# Patient Record
Sex: Female | Born: 1955 | Race: White | Hispanic: No | Smoking: Never smoker
Health system: Southern US, Community
[De-identification: ages and names within clinical notes are randomized; demographics above are authoritative.]

## PROBLEM LIST (undated history)

## (undated) DIAGNOSIS — I469 Cardiac arrest, cause unspecified: Secondary | ICD-10-CM

## (undated) DIAGNOSIS — IMO0001 Reserved for inherently not codable concepts without codable children: Secondary | ICD-10-CM

## (undated) DIAGNOSIS — R350 Frequency of micturition: Secondary | ICD-10-CM

## (undated) DIAGNOSIS — I509 Heart failure, unspecified: Secondary | ICD-10-CM

## (undated) DIAGNOSIS — F329 Major depressive disorder, single episode, unspecified: Secondary | ICD-10-CM

## (undated) DIAGNOSIS — I499 Cardiac arrhythmia, unspecified: Secondary | ICD-10-CM

## (undated) DIAGNOSIS — K219 Gastro-esophageal reflux disease without esophagitis: Secondary | ICD-10-CM

## (undated) DIAGNOSIS — D649 Anemia, unspecified: Secondary | ICD-10-CM

## (undated) DIAGNOSIS — Z950 Presence of cardiac pacemaker: Secondary | ICD-10-CM

## (undated) DIAGNOSIS — I4891 Unspecified atrial fibrillation: Secondary | ICD-10-CM

## (undated) DIAGNOSIS — F32A Depression, unspecified: Secondary | ICD-10-CM

## (undated) DIAGNOSIS — I428 Other cardiomyopathies: Secondary | ICD-10-CM

## (undated) DIAGNOSIS — F419 Anxiety disorder, unspecified: Secondary | ICD-10-CM

## (undated) DIAGNOSIS — E079 Disorder of thyroid, unspecified: Secondary | ICD-10-CM

## (undated) DIAGNOSIS — J449 Chronic obstructive pulmonary disease, unspecified: Secondary | ICD-10-CM

## (undated) HISTORY — DX: Anemia, unspecified: D64.9

## (undated) HISTORY — DX: Other cardiomyopathies: I42.8

## (undated) HISTORY — DX: Unspecified atrial fibrillation: I48.91

## (undated) HISTORY — DX: Disorder of thyroid, unspecified: E07.9

## (undated) HISTORY — DX: Cardiac arrest, cause unspecified: I46.9

## (undated) HISTORY — PX: BREAST BIOPSY: SHX20

## (undated) HISTORY — DX: Anxiety disorder, unspecified: F41.9

## (undated) HISTORY — DX: Depression, unspecified: F32.A

## (undated) HISTORY — PX: INSERT / REPLACE / REMOVE PACEMAKER: SUR710

## (undated) HISTORY — DX: Major depressive disorder, single episode, unspecified: F32.9

## (undated) HISTORY — PX: BREAST EXCISIONAL BIOPSY: SUR124

---

## 2000-11-16 ENCOUNTER — Ambulatory Visit (HOSPITAL_COMMUNITY): Admission: RE | Admit: 2000-11-16 | Discharge: 2000-11-16 | Payer: Self-pay | Admitting: Cardiology

## 2001-03-24 ENCOUNTER — Encounter: Payer: Self-pay | Admitting: Internal Medicine

## 2001-03-24 ENCOUNTER — Ambulatory Visit (HOSPITAL_COMMUNITY): Admission: RE | Admit: 2001-03-24 | Discharge: 2001-03-24 | Payer: Self-pay | Admitting: Internal Medicine

## 2001-03-31 ENCOUNTER — Other Ambulatory Visit: Admission: RE | Admit: 2001-03-31 | Discharge: 2001-03-31 | Payer: Self-pay | Admitting: Internal Medicine

## 2001-04-18 ENCOUNTER — Encounter: Payer: Self-pay | Admitting: Internal Medicine

## 2001-04-18 ENCOUNTER — Ambulatory Visit (HOSPITAL_COMMUNITY): Admission: RE | Admit: 2001-04-18 | Discharge: 2001-04-18 | Payer: Self-pay | Admitting: Internal Medicine

## 2002-11-27 ENCOUNTER — Ambulatory Visit (HOSPITAL_COMMUNITY): Admission: RE | Admit: 2002-11-27 | Discharge: 2002-11-27 | Payer: Self-pay | Admitting: Cardiology

## 2002-12-04 ENCOUNTER — Ambulatory Visit (HOSPITAL_COMMUNITY): Admission: RE | Admit: 2002-12-04 | Discharge: 2002-12-04 | Payer: Self-pay | Admitting: Internal Medicine

## 2002-12-04 ENCOUNTER — Encounter: Payer: Self-pay | Admitting: Internal Medicine

## 2004-10-28 ENCOUNTER — Emergency Department (HOSPITAL_COMMUNITY): Admission: EM | Admit: 2004-10-28 | Discharge: 2004-10-28 | Payer: Self-pay | Admitting: Emergency Medicine

## 2004-11-10 ENCOUNTER — Other Ambulatory Visit: Admission: RE | Admit: 2004-11-10 | Discharge: 2004-11-10 | Payer: Self-pay | Admitting: Otolaryngology

## 2005-11-11 ENCOUNTER — Ambulatory Visit (HOSPITAL_COMMUNITY): Admission: RE | Admit: 2005-11-11 | Discharge: 2005-11-11 | Payer: Self-pay | Admitting: Family Medicine

## 2007-05-29 ENCOUNTER — Ambulatory Visit (HOSPITAL_COMMUNITY): Admission: RE | Admit: 2007-05-29 | Discharge: 2007-05-29 | Payer: Self-pay | Admitting: Family Medicine

## 2009-01-03 ENCOUNTER — Ambulatory Visit (HOSPITAL_COMMUNITY): Admission: RE | Admit: 2009-01-03 | Discharge: 2009-01-03 | Payer: Self-pay | Admitting: Family Medicine

## 2010-08-23 ENCOUNTER — Encounter: Payer: Self-pay | Admitting: Family Medicine

## 2010-11-05 ENCOUNTER — Emergency Department (HOSPITAL_COMMUNITY)
Admission: EM | Admit: 2010-11-05 | Discharge: 2010-11-05 | Disposition: A | Discharge status: Home / Self Care | Payer: BC Managed Care – PPO | Attending: Emergency Medicine | Admitting: Emergency Medicine

## 2010-11-05 DIAGNOSIS — I2510 Atherosclerotic heart disease of native coronary artery without angina pectoris: Secondary | ICD-10-CM | POA: Insufficient documentation

## 2010-11-05 DIAGNOSIS — L500 Allergic urticaria: Secondary | ICD-10-CM | POA: Insufficient documentation

## 2010-12-07 ENCOUNTER — Encounter: Payer: Self-pay | Admitting: Cardiology

## 2010-12-07 ENCOUNTER — Ambulatory Visit (INDEPENDENT_AMBULATORY_CARE_PROVIDER_SITE_OTHER): Payer: BC Managed Care – PPO | Admitting: Cardiology

## 2010-12-07 DIAGNOSIS — F419 Anxiety disorder, unspecified: Secondary | ICD-10-CM | POA: Insufficient documentation

## 2010-12-07 DIAGNOSIS — I471 Supraventricular tachycardia: Secondary | ICD-10-CM | POA: Insufficient documentation

## 2010-12-07 DIAGNOSIS — D649 Anemia, unspecified: Secondary | ICD-10-CM

## 2010-12-07 DIAGNOSIS — I4891 Unspecified atrial fibrillation: Secondary | ICD-10-CM | POA: Insufficient documentation

## 2010-12-07 DIAGNOSIS — I4510 Unspecified right bundle-branch block: Secondary | ICD-10-CM

## 2010-12-07 MED ORDER — DILTIAZEM HCL ER COATED BEADS 180 MG PO CP24: 180.0000 mg | ORAL_CAPSULE | Freq: Every day | ORAL | Status: DC

## 2010-12-07 NOTE — Assessment & Plan Note (Signed)
Arrhythmias causing minimal, if any, symptoms.   Rate control is fairly good, but not optimal; diltiazem dosage will be increased to 180 mg q.d.   Echocardiogram will be repeated, as previous study was obtained 8 years ago and showed mild abnormality of left ventricular systolic function.  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  [name] [name] has no risk factors for thromboembolism, and aspirin serves as adequate antithrombotic therapy.

## 2010-12-07 NOTE — Patient Instructions (Signed)
Your physician recommends that you schedule a follow-up appointment in:6 MONTHS Your physician recommends that you return for lab work in: TODAY Your physician has requested that you have an echocardiogram. Echocardiography is a painless test that uses sound waves to create images of your heart. It provides your doctor with information about the size and shape of your heart and how well your heart's chambers and valves are working. This procedure takes approximately one hour. There are no restrictions for this procedure. REFERRAL TO [facility] [employer]: BETTY RATLIFF Your physician has recommended you make the following change in your medication:INCREASE DILTIAZEM TO 180MG  DAILY

## 2010-12-07 NOTE — Progress Notes (Signed)
HPI:  [name] [name] is seen at the kind request of Renelda Loma, nurse practitioner at the [facility].  I last saw this nice woman in 2005 after having followed her for PSVT and paroxysmal atrial fibrillation.  She is now referred for assessment and treatment of what appears to be chronic atrial fibrillation.  Patient also describes recent dyspnea, but this is fairly vague.  Symptoms appear to have been related to exertion, but have now resolved.  She has had no chest discomfort.  She denies orthopnea, PND or pedal edema.  She notes occasional mild palpitations.  There has been no lightheadedness or syncope.  Current Outpatient Prescriptions on File Prior to Visit  Medication Sig Dispense Refill  . atenolol (TENORMIN) 50 MG tablet Take 50 mg by mouth daily.        Marland Kitchen DISCONTD: diltiazem (CARDIZEM CD) 120 MG 24 hr capsule Take 120 mg by mouth daily.        Marland Kitchen DISCONTD: levothyroxine (SYNTHROID, LEVOTHROID) 125 MCG tablet Take 125 mcg by mouth daily.        Marland Kitchen DISCONTD: doxycycline (DORYX) 100 MG DR capsule Take 100 mg by mouth as needed.          No Known Allergies    Past Medical History  Diagnosis Date  . Paroxysmal supraventricular tachycardia     Lost to followup-2006  . Atrial fibrillation     Echocardiogram in 2004-borderline LV function; no valvular abnormalities  . Right bundle branch block   . Anxiety   . Anemia     2011: Hemoglobin of 11.4; hematocrit of 33.5; normal MCV     Past Surgical History  Procedure Date  . Breast excisional biopsy     Left x2     History reviewed. No pertinent family history.   History   Social History  . Marital Status: Divorced    Spouse Name: N/A    Number of Children: N/A  . Years of Education: N/A   Occupational History  . Not on file.   Social History Main Topics  . Smoking status: Never Smoker   . Smokeless tobacco: Never Used  . Alcohol Use: No  . Drug Use: No  . Sexually Active: Not on file   Other Topics  Concern  . Not on file   Social History Narrative  . No narrative on file     ROS:  Requires corrective lenses for near vision; arthritic discomfort of the hands; all other systems reviewed and are negative.  PHYSICAL EXAM: BP 140/90  Pulse 63  Ht 5\' 5"  (1.651 m)  Wt 116 lb (52.617 kg)  BMI 19.30 kg/m2  SpO2 96%  General-Well-developed; no acute distress; anxious HEENT-[place]/AT; PERRL; EOM intact; conjunctiva and lids nl Neck-No JVD; no carotid bruits Endocrine-No thyromegaly Lungs-No tachypnea, clear without rales, rhonchi or wheezes; mild kyphosis Cardiovascular- normal PMI; normal S1 and S2; irregular rhythm; modest systolic murmur Abdomen-BS normal; soft and non-tender without masses or organomegaly Musculoskeletal-No deformities, cyanosis or clubbing Neurologic-Nl cranial nerves; symmetric strength and tone Skin- Warm, no sig. Lesions Extremities-Nl distal pulses; no edema  EKG:   Atrial fibrillation with a slightly rapid ventricular response; PVCs vs aberrant beats; left anterior fascicular block; right bundle branch block; delayed R-wave progression suggestive of previous anteroseptal MI.   Comparison with previous tracing of 08/01/03, atrial fibrillation has replaced sinus bradycardia; otherwise no significant change.      With exercise, heart rate increased only modestly to 110 bpm.  Lab:  09/2009: CBC normal except for hemoglobin of 11.4 and hematocrit of 33.5; CMet normal; lipid profile: Total of 156, TG-52; HDL-48; LDL-98.  Digoxin level-0.6  ASSESSMENT AND PLAN:

## 2010-12-09 ENCOUNTER — Ambulatory Visit (HOSPITAL_COMMUNITY)
Admission: RE | Admit: 2010-12-09 | Discharge: 2010-12-09 | Disposition: A | Discharge status: Home / Self Care | Payer: BC Managed Care – PPO | Source: Ambulatory Visit | Attending: Cardiology | Admitting: Cardiology

## 2010-12-09 DIAGNOSIS — I059 Rheumatic mitral valve disease, unspecified: Secondary | ICD-10-CM

## 2010-12-09 DIAGNOSIS — I4891 Unspecified atrial fibrillation: Secondary | ICD-10-CM | POA: Insufficient documentation

## 2010-12-10 ENCOUNTER — Encounter: Payer: Self-pay | Admitting: *Deleted

## 2010-12-10 NOTE — Progress Notes (Signed)
Result letter mailed to pt.

## 2010-12-18 NOTE — Procedures (Signed)
   NAME:  [name] [name], [name] [name]                          ACCOUNT NO.:  [id number]   MEDICAL RECORD NO.:  [id number]                   PATIENT TYPE:  OUT   LOCATION:  RAD                                  FACILITY:  [facility]   PHYSICIAN:  Vida Roller, M.D.                DATE OF BIRTH:  Dec 26, 1955   DATE OF PROCEDURE:  11/27/2002  DATE OF DISCHARGE:                                  ECHOCARDIOGRAM   TAPE NUMBER:  [id number]   TAPE COUNT:  [id number]   CLINICAL INFORMATION:  This is a 55 year old woman with palpitations.   TECHNICAL QUALITY:  The technical quality of this study is good.   MEASUREMENTS M-MODE:  1. The aorta is 33 mm.  2. The left atrium is 33 mm.  3. The septum is 10 mm.  4. The posterior wall is 9 mm.  5. The left ventricular diastolic dimension is 46 mm.  6. The left ventricular systolic dimension is 37 mm.   2-D AND DOPPLER IMAGING:  1. The left ventricle is normal size with normal wall thickness. There     appears to be a mild decrement in overall left ventricular function     estimated at 45-50%.  The previous study shows a normal left ventricular     function and overall the wall motion is globally hypokinetic.  2. The right ventricle is normal size with normal systolic function.  3. Both atria appear normal size. There is no evidence of an atrioseptal.  4. The aortic valve is trileaflet and tricommisural with mild thickness.     There is trace insufficiency.  No cyanosis is seen.  5. The mitral valve is morphologically unremarkable with normal leaflet     movement.  There is mild insufficiency.  No stenosis is seen.  6. The tricuspid valve is morphologically unremarkable with trace     insufficiency.  No stenosis is seen.  7. The pulmonic valve is morphologically unremarkable with trace     insufficiency, no stenosis is seen.  8. The pericardial structures appear normal.  9. The ascending aorta appears normal.  10.      There is a normal sized inferior vena  cava.                                               Vida Roller, M.D.    JH/MEDQ  D:  11/27/2002  T:  11/27/2002  Job:  [id number]

## 2011-03-18 ENCOUNTER — Encounter (HOSPITAL_COMMUNITY): Payer: BC Managed Care – PPO

## 2011-03-18 ENCOUNTER — Other Ambulatory Visit (HOSPITAL_COMMUNITY): Payer: Self-pay | Admitting: Family Medicine

## 2011-03-18 DIAGNOSIS — Z139 Encounter for screening, unspecified: Secondary | ICD-10-CM

## 2011-03-25 ENCOUNTER — Ambulatory Visit (HOSPITAL_COMMUNITY)
Admission: RE | Admit: 2011-03-25 | Discharge: 2011-03-25 | Disposition: A | Discharge status: Home / Self Care | Payer: PRIVATE HEALTH INSURANCE | Source: Ambulatory Visit | Attending: Family Medicine | Admitting: Family Medicine

## 2011-03-25 ENCOUNTER — Ambulatory Visit (HOSPITAL_COMMUNITY): Admission: RE | Admit: 2011-03-25 | Payer: BC Managed Care – PPO | Source: Ambulatory Visit | Admitting: Podiatry

## 2011-03-25 ENCOUNTER — Encounter (HOSPITAL_COMMUNITY): Admission: RE | Payer: Self-pay | Source: Ambulatory Visit

## 2011-03-25 DIAGNOSIS — Z1231 Encounter for screening mammogram for malignant neoplasm of breast: Secondary | ICD-10-CM | POA: Insufficient documentation

## 2011-03-25 DIAGNOSIS — Z139 Encounter for screening, unspecified: Secondary | ICD-10-CM

## 2011-03-25 SURGERY — BUNIONECTOMY
Anesthesia: Monitor Anesthesia Care | Site: Foot | Laterality: Left

## 2011-04-19 ENCOUNTER — Telehealth: Payer: Self-pay | Admitting: Cardiology

## 2011-04-19 MED ORDER — DIGOXIN 125 MCG PO TABS: 125.0000 ug | ORAL_TABLET | Freq: Every day | ORAL | Status: DC

## 2011-04-19 NOTE — Telephone Encounter (Signed)
Spoke with patient and let her know rx called in

## 2011-04-19 NOTE — Telephone Encounter (Signed)
NEEDS DR Dietrich Pates TO PUT NEW ORDER IN FOR DIGOXIN SHE HAS ENOUGH FOR THIS MONTH BUT NO REFILLS LEFT/TMJ

## 2011-07-16 ENCOUNTER — Telehealth: Payer: Self-pay | Admitting: Cardiology

## 2011-07-16 DIAGNOSIS — I471 Supraventricular tachycardia: Secondary | ICD-10-CM

## 2011-07-16 DIAGNOSIS — I4891 Unspecified atrial fibrillation: Secondary | ICD-10-CM

## 2011-07-16 MED ORDER — DILTIAZEM HCL ER COATED BEADS 180 MG PO CP24: 180.0000 mg | ORAL_CAPSULE | Freq: Every day | ORAL | Status: DC

## 2011-07-16 NOTE — Telephone Encounter (Signed)
PT NEEDS DILTAZEM 180 MG CALLED IN TO [facility] SHE WILL BE OUT OF MEDS ON 07/20/11. SHE DID MAKE APPT WITH KATHRYN FOR 07/22/11/TMJ

## 2011-07-22 ENCOUNTER — Ambulatory Visit: Payer: PRIVATE HEALTH INSURANCE | Admitting: Adult Health

## 2011-08-05 ENCOUNTER — Other Ambulatory Visit: Payer: Self-pay | Admitting: Obstetrics & Gynecology

## 2011-08-05 NOTE — Patient Instructions (Signed)
20 [name] [name]  08/05/2011   Your procedure is scheduled on:  08/11/11  Report to [facility] at 11:50 AM.  Call this number if you have problems the morning of surgery: [phone]   Remember:   Do not eat food:After Midnight.  May have clear liquids:until Midnight .  Clear liquids include soda, tea, black coffee, apple or grape juice, broth.  Take these medicines the morning of surgery with A SIP OF WATER:    Do not wear jewelry, make-up or nail polish.  Do not wear lotions, powders, or perfumes. You may wear deodorant.  Do not shave 48 hours prior to surgery.  Do not bring valuables to the hospital.  Contacts, dentures or bridgework may not be worn into surgery.  Leave suitcase in the car. After surgery it may be brought to your room.  For patients admitted to the hospital, checkout time is 11:00 AM the day of discharge.   Patients discharged the day of surgery will not be allowed to drive home.  Name and phone number of your driver:   Special Instructions: CHG Shower Use Special Wash: 1/2 bottle night before surgery and 1/2 bottle morning of surgery.   Please read over the following fact sheets that you were given: Pain Booklet, MRSA Information, Surgical Site Infection Prevention, Anesthesia Post-op Instructions and Care and Recovery After Surgery    Conization of the Cervix Conization is the cutting (excision) of a cone-shaped portion of the cervix. This procedure is usually done when there is abnormal bleeding from the cervix. It can also be done to evaluate an abnormal Pap smear or if an abnormality is seen on the cervix during an exam. Conization of the cervix is not done during a menstrual period or pregnancy. BEFORE THE PROCEDURE  Do not eat or drink anything for 6 to 8 hours before the procedure, especially if you are going to be given a drug to make you sleep (general anesthetic).   Do not take aspirin or blood thinners for at least a week before the procedure, or as  directed.   Arrive at least an hour before the procedure to read and sign the necessary forms.   Arrange for someone to take you home after the procedure.   If you smoke, do not smoke for 2 weeks before the procedure.  LET YOUR CAREGIVER KNOW THE FOLLOWING:  Allergies to food or medications.   All the medications you are taking including over-the-counter and prescription medications, herbs, eyedrops, and creams.   You develop a cold or an infection.   If you are using illegal drugs or drinking too much alcohol.   Your smoking habits.   Previous problems with anesthetics including novocaine.   The possibility of being pregnant.   History of blood clots or other bleeding problems.   Other medical problems.  RISKS AND COMPLICATIONS   Bleeding.   Infection.   Damage to the cervix.   Injury to surrounding organs.   Problems with the anesthesia.  PROCEDURE Conization of the cervix can be done by:  Cold knife. This type cuts out the cervical canal and the transformation zone (where the normal cells end and the abnormal cells begin) with a scalpel.   LEEP (electrocautery). This type is done with a thin wire that can cut and burn (cauterize) the cervical tissue with an electrical current.   Laser treatment. This type cuts and burns (cauterizes) the tissue of the cervix to prevent bleeding with a laser beam.  You will be given a drug to make you sleep (general anesthetic) for cold knife and laser treatments, and you will be given a numbing medication (local anesthetic) for the LEEP procedure. Conization is usually done using colposcopy. Colposcopy magnifies the cervix to see it more clearly. The tissue removed is examined under a microscope by a doctor Psychologist, educational). The pathologist will provide a report to your caregiver. This will help your caregiver decide if further treatment is necessary. This report will also help your caregiver decide on the best treatment if your results  are abnormal.  AFTER THE PROCEDURE  If you had a general anesthetic, you may be groggy for a couple of hours after the procedure.   If you had a local anesthetic, you will be advised to rest at the surgical center or caregiver's office until you are stable and feel ready to go home.   Have someone take you home.   You may have some cramping for a couple of days.   You may have a bloody discharge or light bleeding for 2 to 4 weeks.   You may have a black discharge coming from the vagina. This is from the paste used on the cervix to prevent bleeding. This is normal discharge.  HOME CARE INSTRUCTIONS   Do not drive for 24 hours.   Avoid strenuous activities and exercises for at least 7 - 10 days.   Only take over-the-counter or prescription medicines for pain, discomfort, or fever as directed by your caregiver.   Do not take aspirin. It can cause or aggravate bleeding.   You may resume your usual diet.   Drink 6 to 8 glasses of fluid a day.   Rest and sleep the first 24 to 48 hours.   Take showers instead of baths until your caregiver gives you the okay.   Do not use tampons, douche or have intercourse for 4 weeks, or as advised by your caregiver.   Do not lift anything over 10 pounds (4.5 kg) for at least 7 to 10 days, or as advised by your caregiver.   Take your temperature twice a day, for 4 to 5 days. Write them down.   Do not drink or drive when taking pain medication.   If you develop constipation:   Take a mild laxative with the advice of your caregiver.   Eat bran foods.   Drink a lot of fluids.   Try to have someone with you or available for you the first 24 to 48 hours, especially if you had a general anesthetic.   Make sure you and your family understand everything about your surgery and recovery.   Follow your caregiver's advice regarding follow-up appointments and Pap smears.  SEEK MEDICAL CARE IF:   You develop a rash.   You are dizzy or  light-headed.   You feel sick to your stomach (nauseous).   You develop a bad smelling vaginal discharge.   You have an abnormal reaction or allergy to your medication.   You need stronger pain medication.  SEEK IMMEDIATE MEDICAL CARE IF:   You have blood clots or bleeding that is heavier than a normal menstrual period, or you develop bright red bleeding.   An oral temperature above 102 F (38.9 C) develops and persists for several hours.   You have increasing cramps.   You pass out.   You have painful or bloody urine.   You start throwing up (vomiting).   The pain is not relieved with  your pain medication.  Not all test results are available during your visit. If your test results are not back during your the visit, make an appointment with your caregiver to find out the results. Do not assume everything is normal if you have not heard from your caregiver or the medical facility. It is important for you to follow up on all of your test results. Document Released: 04/28/2005 Document Revised: 03/31/2011 Document Reviewed: 02/17/2009 [facility] Patient Information 2012 [place], [place].   PATIENT INSTRUCTIONS POST-ANESTHESIA  IMMEDIATELY FOLLOWING SURGERY:  Do not drive or operate machinery for the first twenty four hours after surgery.  Do not make any important decisions for twenty four hours after surgery or while taking narcotic pain medications or sedatives.  If you develop intractable nausea and vomiting or a severe headache please notify your doctor immediately.  FOLLOW-UP:  Please make an appointment with your surgeon as instructed. You do not need to follow up with anesthesia unless specifically instructed to do so.  WOUND CARE INSTRUCTIONS (if applicable):  Keep a dry clean dressing on the anesthesia/puncture wound site if there is drainage.  Once the wound has quit draining you may leave it open to air.  Generally you should leave the bandage intact for twenty four hours  unless there is drainage.  If the epidural site drains for more than 36-48 hours please call the anesthesia department.  QUESTIONS?:  Please feel free to call your physician or the hospital operator if you have any questions, and they will be happy to assist you.     [facility] Anesthesia Department [address] [place] [phone]

## 2011-08-06 ENCOUNTER — Ambulatory Visit (INDEPENDENT_AMBULATORY_CARE_PROVIDER_SITE_OTHER): Payer: PRIVATE HEALTH INSURANCE | Admitting: Adult Health

## 2011-08-06 ENCOUNTER — Encounter (HOSPITAL_COMMUNITY): Admission: RE | Admit: 2011-08-06 | Discharge: 2011-08-06 | Payer: BC Managed Care – PPO | Source: Ambulatory Visit

## 2011-08-06 ENCOUNTER — Encounter: Payer: Self-pay | Admitting: Adult Health

## 2011-08-06 DIAGNOSIS — I4891 Unspecified atrial fibrillation: Secondary | ICD-10-CM

## 2011-08-06 DIAGNOSIS — D649 Anemia, unspecified: Secondary | ICD-10-CM

## 2011-08-06 NOTE — Assessment & Plan Note (Signed)
Heart rate is well controlled and she is asymptomatic.  I will check labs with BMET and Dig level to evaluate her status on current medications. She will follow-up with Dr. Dietrich Pates in 6 months,she will continue ASA with CHADs score of 0. Will also check TSH has she is no longer on thyroid medications for follow-up.

## 2011-08-06 NOTE — Progress Notes (Signed)
   HPI: Ms. Berens in a very pleasant 56 y/o patient of Dr. Dietrich Pates we are following for ongoing assessment and treatment of PAF. She has a history of thyroid disease and anemia. She comes today without complaint with the exception of chronic fatigue. She works nights at [employer] and does not always get her sleep as she should. She denies palpitations or discomfort in her chest. She stated that when her HR was really elevated she felt pressure in her throat and neck. She is complaint with her medications. She has not been seen by a PCP since being seen by Dr. Dietrich Pates on consultation at their Raymondo Band, NP at [facility]-,in May of 2012.  No Known Allergies  Current Outpatient Prescriptions  Medication Sig Dispense Refill  . aspirin 325 MG tablet Take 325 mg by mouth daily.        Marland Kitchen atenolol (TENORMIN) 50 MG tablet Take 50 mg by mouth daily.        . digoxin (LANOXIN) 0.125 MG tablet Take 1 tablet (125 mcg total) by mouth daily.  30 tablet  6  . diltiazem (CARDIZEM CD) 180 MG 24 hr capsule Take 1 capsule (180 mg total) by mouth daily.  90 capsule  3    Past Medical History  Diagnosis Date  . Paroxysmal supraventricular tachycardia     Lost to followup-2006  . [place]-induced atrial fibrillation     Echocardiogram in 2004-borderline LV function; no valvular abnormalities  . Right bundle branch block   . Anxiety   . Anemia     2011: Hemoglobin of 11.4; hematocrit of 33.5; normal MCV    Past Surgical History  Procedure Date  . Breast excisional biopsy     Left x2    ZOX:WRUEAV of systems complete and found to be negative unless listed above PHYSICAL EXAM BP 126/66  Pulse 51  Resp 16  Ht 5\' 5"  (1.651 m)  Wt 118 lb (53.524 kg)  BMI 19.64 kg/m2  General: Well developed, well nourished, in no acute distress Head: Eyes PERRLA, No xanthomas.   Normal cephalic and atramatic  Lungs: Clear bilaterally to auscultation and percussion. Heart: HRRR S1 S2,  without MRG.  Pulses are 2+ & equal.            No carotid bruit. No JVD.  No abdominal bruits. No femoral bruits. Abdomen: Bowel sounds are positive, abdomen soft and non-tender without masses or                  Hernia's noted. Msk:  Back normal, normal gait. Normal strength and tone for age. Extremities: No clubbing, cyanosis or edema.  DP +1 Neuro: Alert and oriented X 3. Psych:  Good affect, responds appropriately   WUJ:WJXBJY fibrillation with aberrancy. T-wave inversion in the anterior/lateral leads.Rate of 85 bpm  ASSESSMENT AND PLAN

## 2011-08-06 NOTE — Assessment & Plan Note (Signed)
She is taking OTC gummy vitamins with iron. I will check her CBC to follow-up as she is not being followed by PCP regularly. She have it done at health department.

## 2011-08-06 NOTE — Patient Instructions (Signed)
**Note De-Identified Jorge Retz Obfuscation** Your physician recommends that you continue on your current medications as directed. Please refer to the Current Medication list given to you today.  Your physician recommends that you return for lab work in: today  Your physician recommends that you schedule a follow-up appointment in: 6 months

## 2011-08-09 ENCOUNTER — Encounter (HOSPITAL_COMMUNITY)
Admission: RE | Admit: 2011-08-09 | Discharge: 2011-08-09 | Disposition: A | Discharge status: Home / Self Care | Payer: BC Managed Care – PPO | Source: Ambulatory Visit | Attending: Obstetrics & Gynecology | Admitting: Obstetrics & Gynecology

## 2011-08-09 ENCOUNTER — Encounter (HOSPITAL_COMMUNITY): Payer: Self-pay

## 2011-08-09 ENCOUNTER — Other Ambulatory Visit: Payer: Self-pay

## 2011-08-09 ENCOUNTER — Encounter (HOSPITAL_COMMUNITY): Payer: Self-pay | Admitting: Pharmacy Technician

## 2011-08-09 HISTORY — DX: Cardiac arrhythmia, unspecified: I49.9

## 2011-08-09 LAB — COMPREHENSIVE METABOLIC PANEL
BUN: 19 mg/dL (ref 6–23)
CO2: 27 mEq/L (ref 19–32)
Calcium: 9.9 mg/dL (ref 8.4–10.5)
Creatinine, Ser: 0.84 mg/dL (ref 0.50–1.10)
GFR calc Af Amer: 88 mL/min — ABNORMAL LOW (ref >90)
GFR calc non Af Amer: 76 mL/min — ABNORMAL LOW (ref >90)
Glucose, Bld: 110 mg/dL — ABNORMAL HIGH (ref 70–99)

## 2011-08-09 LAB — URINALYSIS, ROUTINE W REFLEX MICROSCOPIC
Bilirubin Urine: NEGATIVE
Nitrite: NEGATIVE
Specific Gravity, Urine: >1.03 — ABNORMAL HIGH (ref 1.005–1.030)
Urobilinogen, UA: 0.2 mg/dL (ref 0.0–1.0)

## 2011-08-09 LAB — CBC
Hemoglobin: 11 g/dL — ABNORMAL LOW (ref 12.0–15.0)
MCH: 32.7 pg (ref 26.0–34.0)
MCV: 94.6 fL (ref 78.0–100.0)
RBC: 3.36 MIL/uL — ABNORMAL LOW (ref 3.87–5.11)

## 2011-08-11 ENCOUNTER — Encounter (HOSPITAL_COMMUNITY): Payer: Self-pay | Admitting: Anesthesiology

## 2011-08-11 ENCOUNTER — Ambulatory Visit (HOSPITAL_COMMUNITY): Payer: BC Managed Care – PPO | Admitting: Anesthesiology

## 2011-08-11 ENCOUNTER — Other Ambulatory Visit: Payer: Self-pay | Admitting: Obstetrics & Gynecology

## 2011-08-11 ENCOUNTER — Ambulatory Visit (HOSPITAL_COMMUNITY)
Admission: RE | Admit: 2011-08-11 | Discharge: 2011-08-11 | Disposition: A | Discharge status: Home / Self Care | Payer: BC Managed Care – PPO | Source: Ambulatory Visit | Attending: Obstetrics & Gynecology | Admitting: Obstetrics & Gynecology

## 2011-08-11 ENCOUNTER — Encounter (HOSPITAL_COMMUNITY)
Admission: RE | Disposition: A | Discharge status: Home / Self Care | Payer: Self-pay | Source: Ambulatory Visit | Attending: Obstetrics & Gynecology

## 2011-08-11 ENCOUNTER — Encounter (HOSPITAL_COMMUNITY): Payer: Self-pay | Admitting: *Deleted

## 2011-08-11 DIAGNOSIS — R87619 Unspecified abnormal cytological findings in specimens from cervix uteri: Secondary | ICD-10-CM | POA: Insufficient documentation

## 2011-08-11 DIAGNOSIS — N86 Erosion and ectropion of cervix uteri: Secondary | ICD-10-CM | POA: Insufficient documentation

## 2011-08-11 DIAGNOSIS — Z9889 Other specified postprocedural states: Secondary | ICD-10-CM

## 2011-08-11 HISTORY — PX: CERVICAL CONIZATION W/BX: SHX1330

## 2011-08-11 SURGERY — CONE BIOPSY, CERVIX
Anesthesia: General | Site: Cervix | Wound class: Clean Contaminated

## 2011-08-11 MED ORDER — ONDANSETRON HCL 4 MG/2ML IJ SOLN: 4.0000 mg | Freq: Once | INTRAMUSCULAR | Status: DC | PRN

## 2011-08-11 MED ORDER — FENTANYL CITRATE 0.05 MG/ML IJ SOLN
INTRAMUSCULAR | Status: AC
  Filled 2011-08-11: qty 5

## 2011-08-11 MED ORDER — CEFAZOLIN SODIUM 1-5 GM-% IV SOLN
INTRAVENOUS | Status: AC
  Filled 2011-08-11: qty 50

## 2011-08-11 MED ORDER — MIDAZOLAM HCL 2 MG/2ML IJ SOLN
INTRAMUSCULAR | Status: AC
  Administered 2011-08-11: 2 mg via INTRAVENOUS
  Filled 2011-08-11: qty 2

## 2011-08-11 MED ORDER — MIDAZOLAM HCL 2 MG/2ML IJ SOLN
INTRAMUSCULAR | Status: AC
  Filled 2011-08-11: qty 2

## 2011-08-11 MED ORDER — LACTATED RINGERS IV SOLN
INTRAVENOUS | Status: DC | PRN
  Administered 2011-08-11 (×2): via INTRAVENOUS

## 2011-08-11 MED ORDER — KETOROLAC TROMETHAMINE 30 MG/ML IJ SOLN
30.0000 mg | Freq: Once | INTRAMUSCULAR | Status: AC
  Administered 2011-08-11: 30 mg via INTRAVENOUS

## 2011-08-11 MED ORDER — HYDROCODONE-ACETAMINOPHEN 5-500 MG PO TABS: 1.0000 | ORAL_TABLET | Freq: Four times a day (QID) | ORAL | Status: AC | PRN

## 2011-08-11 MED ORDER — FENTANYL CITRATE 0.05 MG/ML IJ SOLN: 25.0000 ug | INTRAMUSCULAR | Status: DC | PRN

## 2011-08-11 MED ORDER — KETOROLAC TROMETHAMINE 10 MG PO TABS: 10.0000 mg | ORAL_TABLET | Freq: Three times a day (TID) | ORAL | Status: AC | PRN

## 2011-08-11 MED ORDER — ONDANSETRON HCL 8 MG PO TABS: 8.0000 mg | ORAL_TABLET | Freq: Three times a day (TID) | ORAL | Status: AC | PRN

## 2011-08-11 MED ORDER — CEFAZOLIN SODIUM 1-5 GM-% IV SOLN
1.0000 g | INTRAVENOUS | Status: AC
  Administered 2011-08-11: 1 g via INTRAVENOUS

## 2011-08-11 MED ORDER — FERRIC SUBSULFATE 259 MG/GM EX SOLN
CUTANEOUS | Status: AC
  Filled 2011-08-11: qty 8

## 2011-08-11 MED ORDER — PROPOFOL 10 MG/ML IV EMUL
INTRAVENOUS | Status: AC
  Filled 2011-08-11: qty 20

## 2011-08-11 MED ORDER — PROPOFOL 10 MG/ML IV EMUL
INTRAVENOUS | Status: DC | PRN
  Administered 2011-08-11: 150 mg via INTRAVENOUS

## 2011-08-11 MED ORDER — MIDAZOLAM HCL 5 MG/5ML IJ SOLN
INTRAMUSCULAR | Status: DC | PRN
  Administered 2011-08-11: 1 mg via INTRAVENOUS

## 2011-08-11 MED ORDER — KETOROLAC TROMETHAMINE 30 MG/ML IJ SOLN
INTRAMUSCULAR | Status: AC
  Administered 2011-08-11: 30 mg via INTRAVENOUS
  Filled 2011-08-11: qty 1

## 2011-08-11 MED ORDER — LACTATED RINGERS IV SOLN
INTRAVENOUS | Status: DC
  Administered 2011-08-11: 1000 mL via INTRAVENOUS

## 2011-08-11 MED ORDER — FERRIC SUBSULFATE 259 MG/GM EX SOLN
CUTANEOUS | Status: DC | PRN
  Administered 2011-08-11: 1

## 2011-08-11 MED ORDER — FENTANYL CITRATE 0.05 MG/ML IJ SOLN
INTRAMUSCULAR | Status: DC | PRN
  Administered 2011-08-11: 25 ug via INTRAVENOUS
  Administered 2011-08-11: 50 ug via INTRAVENOUS
  Administered 2011-08-11: 25 ug via INTRAVENOUS

## 2011-08-11 MED ORDER — EPHEDRINE SULFATE 50 MG/ML IJ SOLN
INTRAMUSCULAR | Status: DC | PRN
  Administered 2011-08-11: 7.5 mg via INTRAVENOUS

## 2011-08-11 MED ORDER — ONDANSETRON HCL 4 MG/2ML IJ SOLN
INTRAMUSCULAR | Status: AC
  Filled 2011-08-11: qty 2

## 2011-08-11 MED ORDER — MIDAZOLAM HCL 2 MG/2ML IJ SOLN
1.0000 mg | INTRAMUSCULAR | Status: DC | PRN
  Administered 2011-08-11: 2 mg via INTRAVENOUS

## 2011-08-11 SURGICAL SUPPLY — 33 items
BLADE SURG SZ10 CARB STEEL (BLADE) IMPLANT
CATH ROBINSON RED A/P 16FR (CATHETERS) IMPLANT
CLOTH BEACON ORANGE TIMEOUT ST (SAFETY) ×2 IMPLANT
COAGULATOR SUCT SWTCH 10FR 6 (ELECTROSURGICAL) IMPLANT
COVER LIGHT HANDLE STERIS (MISCELLANEOUS) ×4 IMPLANT
DECANTER SPIKE VIAL GLASS SM (MISCELLANEOUS) ×2 IMPLANT
ELECT REM PT RETURN 9FT ADLT (ELECTROSURGICAL)
ELECTRODE REM PT RTRN 9FT ADLT (ELECTROSURGICAL) IMPLANT
FORMALIN 10 PREFIL 120ML (MISCELLANEOUS) ×2 IMPLANT
GAUZE SPONGE 4X4 16PLY XRAY LF (GAUZE/BANDAGES/DRESSINGS) ×2 IMPLANT
GLOVE BIOGEL PI IND STRL 7.0 (GLOVE) ×2 IMPLANT
GLOVE BIOGEL PI IND STRL 8 (GLOVE) ×1 IMPLANT
GLOVE BIOGEL PI INDICATOR 7.0 (GLOVE) ×2
GLOVE BIOGEL PI INDICATOR 8 (GLOVE) ×1
GLOVE ECLIPSE 6.5 STRL STRAW (GLOVE) ×2 IMPLANT
GLOVE ECLIPSE 8.0 STRL XLNG CF (GLOVE) ×2 IMPLANT
GOWN STRL REIN XL XLG (GOWN DISPOSABLE) ×4 IMPLANT
KIT ROOM TURNOVER APOR (KITS) ×2 IMPLANT
LASER FIBER DISP 1000U (UROLOGICAL SUPPLIES) ×2 IMPLANT
MANIFOLD NEPTUNE II (INSTRUMENTS) ×2 IMPLANT
NEEDLE HYPO 25X1 1.5 SAFETY (NEEDLE) IMPLANT
NS IRRIG 1000ML POUR BTL (IV SOLUTION) ×2 IMPLANT
PACK BASIC III (CUSTOM PROCEDURE TRAY) ×1
PACK PERI GYN (CUSTOM PROCEDURE TRAY) IMPLANT
PACK SRG BSC III STRL LF ECLPS (CUSTOM PROCEDURE TRAY) ×1 IMPLANT
PAD ARMBOARD 7.5X6 YLW CONV (MISCELLANEOUS) ×4 IMPLANT
SET BASIN LINEN APH (SET/KITS/TRAYS/PACK) ×2 IMPLANT
SUT VIC AB 0 CT1 27 (SUTURE)
SUT VIC AB 0 CT1 27XCR 8 STRN (SUTURE) IMPLANT
SYR CONTROL 10ML LL (SYRINGE) IMPLANT
TOWEL OR 17X26 4PK STRL BLUE (TOWEL DISPOSABLE) ×2 IMPLANT
TUBING SMOKE EVAC CO2 (TUBING) ×2 IMPLANT
WATER STERILE IRR 1000ML POUR (IV SOLUTION) ×2 IMPLANT

## 2011-08-11 NOTE — Transfer of Care (Signed)
Immediate Anesthesia Transfer of Care Note  Patient: [name] [name]  Procedure(s) Performed:  CONIZATION CERVIX WITH BIOPSY - Laser Conization of Cervix  Patient Location: PACU  Anesthesia Type: General  Level of Consciousness: awake and alert   Airway & Oxygen Therapy: Patient connected to face mask oxygen  Post-op Assessment: Report given to PACU RN  Post vital signs: Reviewed  Complications: No apparent anesthesia complications

## 2011-08-11 NOTE — Discharge Instructions (Signed)
Conization of the Cervix Care After Read the instructions below and refer to this sheet in the next few weeks. These instructions provide you with general information on caring for yourself after you leave the hospital. Your caregiver may also give you additional or specific instructions. While your treatment has been planned according to the most current medical practices available, unavoidable problems sometimes happen. If you have any problems or questions after you leave, call your caregiver. DISCOMFORT AND NORMAL FINDINGS AFTER THE PROCEDURE  You may have cramps (similar to menstrual cramps) for a few days.   You may have a bloody discharge or light bleeding for 2 to 4 weeks.   You may have a black vaginal discharge. This is from the paste that was applied to the cervix to control bleeding.  HOME CARE INSTRUCTIONS   Do not drive for 24 hours.   Avoid strenuous activities and exercises for at least 7 - 10 days.   Only take over-the-counter or prescription medicines for pain, discomfort, or fever as directed by your caregiver.   Do not take aspirin. It can cause or aggravate bleeding.   You may resume your usual diet.   Drink 6 to 8 glasses of fluid a day.   Rest and sleep the first 24 to 48 hours.   Take showers instead of baths until your caregiver gives you the okay.   Do not use tampons, douche or have intercourse for 4 weeks, or as advised by your caregiver.   Do not lift anything over 10 pounds (4.5 kg) for at least 7 to 10 days, or as advised by your caregiver.   Take your temperature twice a day, for 4 to 5 days. Write them down.   Do not drink or drive when taking pain medication.   If you develop constipation:   Take a mild laxative with the advice of your caregiver.   Eat bran foods.   Drink a lot of fluids.   Try to have someone with you or available for you the first 24 to 48 hours, especially if you had a general anesthetic.   Make sure you and your  family understand everything about your surgery and recovery.   Follow your caregiver's advice regarding follow-up appointments and Pap smears.  SEEK MEDICAL CARE IF:   You develop a rash.   You are dizzy or light-headed.   You feel sick to your stomach (nauseous).   You develop a bad smelling vaginal discharge.   You have an abnormal reaction or allergy to your medication.   You need stronger pain medication.  SEEK IMMEDIATE MEDICAL CARE IF:   You have blood clots or bleeding that is heavier than a normal menstrual period, or you develop bright red bleeding.   An oral temperature above 102 F (38.9 C) develops and persists for several hours.   You have increasing cramps.   You pass out.   You have pain when urinating or bloody urine.   You start throwing up (vomiting).   The pain is not relieved with your pain medication.  MAKE SURE YOU:   Understand these instructions.   Will watch your condition.   Will get help right away if you are not doing well or get worse.  Document Released: 07/19/2005 Document Revised: 03/31/2011 Document Reviewed: 02/17/2009 [facility] Patient Information 2012 [place], [place].

## 2011-08-11 NOTE — Op Note (Signed)
Preoperative Diagnosis:  1.   ASC-H                                          2.  Inadequate colposcopy                                          3.  Lesion in endocervical canal  Postoperative Diagnosis:  Same as above  Procedure:  Laser conization of the cervix  Surgeon:  Lazaro Arms MD  Anaesthesia:  Laryngeal Mask Airway  Findings:  Patient had an abnormal pap smear which was evaluated in the office with colposcopy and directed biopsies.  There is a lesion extending into the endocervical canal and could not be thoroughly biopsied or seen with the colposcope.   As a result, the patient is admitted for laser conization of the cervix for both diagnostic and therapeutic reasons.  Description of Note:  Patient was taken to the OR and placed in the supine position where she underwent laryngeal mask airway anaesthesia.  She was placed in the dorsal lithotomy position.  She was draped for laser.  Graves speculum was placed and 3% acetic acid used and the laser microscope employed to perform colposcopy which confirmed the office findings.  Laser was used on typical cervical settings and used to incise a good margin around the squamocolumnar junction.  A circumferential incision was made.  The laser was then used to remove a conical shaped portion of cervix 5-7 mm in depth.    Surgical margin of several mm was employed beyond the acetowhite epithelium.  The laser was then used to ablate the cone bed.  Hemostasis was achieved with the laser and Monsel's solution.  Patient was awakened from anaesthesia in good stable condition and all counts were correct.  She received Ancef 1 gram and Toradol 30 mg IV preoperatively prophylactically.  Dannilynn Gallina H 08/11/2011 1:07 PM

## 2011-08-11 NOTE — Preoperative (Signed)
Beta Blockers   Reason not to administer Beta Blockers:Not Applicable 

## 2011-08-11 NOTE — Anesthesia Preprocedure Evaluation (Addendum)
Anesthesia Evaluation  Patient identified by MRN, date of birth, ID band Patient awake    Reviewed: Allergy & Precautions, H&P , NPO status , Patient's Chart, lab work & pertinent test results, reviewed documented beta blocker date and time   History of Anesthesia Complications Negative for: history of anesthetic complications  Airway Mallampati: I      Dental  (+) Teeth Intact   Pulmonary neg pulmonary ROS,  clear to auscultation        Cardiovascular + dysrhythmias Atrial Fibrillation Irregular Normal    Neuro/Psych    GI/Hepatic   Endo/Other    Renal/GU      Musculoskeletal   Abdominal   Peds  Hematology   Anesthesia Other Findings   Reproductive/Obstetrics                          Anesthesia Physical Anesthesia Plan  ASA: III  Anesthesia Plan: General   Post-op Pain Management:    Induction: Intravenous  Airway Management Planned: LMA  Additional Equipment:   Intra-op Plan:   Post-operative Plan: Extubation in OR  Informed Consent: I have reviewed the patients History and Physical, chart, labs and discussed the procedure including the risks, benefits and alternatives for the proposed anesthesia with the patient or authorized representative who has indicated his/her understanding and acceptance.     Plan Discussed with:   Anesthesia Plan Comments:         Anesthesia Quick Evaluation

## 2011-08-11 NOTE — H&P (Signed)
[name] [name] is an 56 y.o. female. With ASC-H pap smear and referred to me as a result.  On colpo lesion seen at endocervical canal extending into canal beyond what i  Could see.  Also could not really get a biopsy of the lesion.  As a result patient needs a diagnostic and therapeutic procedure to evaluate the lesion.     Past Medical History  Diagnosis Date  . Dysrhythmia     Past Surgical History  Procedure Date  . Breast biopsy     left, [facility]    Family History  Problem Relation Age of Onset  . Anesthesia problems Neg Hx   . Hypotension Neg Hx   . Malignant hyperthermia Neg Hx   . Pseudochol deficiency Neg Hx     Social History:  does not have a smoking history on file. She does not have any smokeless tobacco history on file. She reports that she does not drink alcohol or use illicit drugs.  Allergies: No Known Allergies    ROS  Review of Systems  Constitutional: Negative for fever, chills, weight loss, malaise/fatigue and diaphoresis.  HENT: Negative for hearing loss, ear pain, nosebleeds, congestion, sore throat, neck pain, tinnitus and ear discharge.   Eyes: Negative for blurred vision, double vision, photophobia, pain, discharge and redness.  Respiratory: Negative for cough, hemoptysis, sputum production, shortness of breath, wheezing and stridor.   Cardiovascular: Negative for chest pain, palpitations, orthopnea, claudication, leg swelling and PND.  Gastrointestinal: Negative for abdominal pain. Negative for heartburn, nausea, vomiting, diarrhea, constipation, blood in stool and melena.  Genitourinary: Negative for dysuria, urgency, frequency, hematuria and flank pain.  Musculoskeletal: Negative for myalgias, back pain, joint pain and falls.  Skin: Negative for itching and rash.  Neurological: Negative for dizziness, tingling, tremors, sensory change, speech change, focal weakness, seizures, loss of consciousness, weakness and headaches.  Endo/Heme/Allergies:  Negative for environmental allergies and polydipsia. Does not bruise/bleed easily.  Psychiatric/Behavioral: Negative for depression, suicidal ideas, hallucinations, memory loss and substance abuse. The patient is not nervous/anxious and does not have insomnia.     Physical Exam Physical Exam  Vitals reviewed. Constitutional: She is oriented to person, place, and time. She appears well-developed and well-nourished.  HENT:  Head: Normocephalic and atraumatic.  Right Ear: External ear normal.  Left Ear: External ear normal.  Nose: Nose normal.  Mouth/Throat: Oropharynx is clear and moist.  Eyes: Conjunctivae and EOM are normal. Pupils are equal, round, and reactive to light. Right eye exhibits no discharge. Left eye exhibits no discharge. No scleral icterus.  Neck: Normal range of motion. Neck supple. No tracheal deviation present. No thyromegaly present.  Cardiovascular: Normal rate, regular rhythm, normal heart sounds and intact distal pulses.  Exam reveals no gallop and no friction rub.   No murmur heard. Respiratory: Effort normal and breath sounds normal. No respiratory distress. She has no wheezes. She has no rales. She exhibits no tenderness.  GI: Soft. Bowel sounds are normal. She exhibits no distension and no mass. There is tenderness. There is no rebound and no guarding.  Genitourinary:       Vulva is normal without lesions Vagina is pink moist without discharge Cervix normal in appearance, on colpo lesion at endocervical canal and cannot be fully seen, requiring therapeutic and diagnostic removal with laser Uterus is normal size shape and contour Adnexa is negative with normal sized ovaries by sonogram  Musculoskeletal: Normal range of motion. She exhibits no edema and no tenderness.  Neurological: She  is alert and oriented to person, place, and time. She has normal reflexes. She displays normal reflexes. No cranial nerve deficit. She exhibits normal muscle tone. Coordination  normal.  Skin: Skin is warm and dry. No rash noted. No erythema. No pallor.  Psychiatric: She has a normal mood and affect. Her behavior is normal. Judgment and thought content normal.    Recent Results (from the past 336 hour(s))  SURGICAL PCR SCREEN   Collection Time   08/09/11  1:58 PM      Component Value Range   MRSA, PCR NEGATIVE  NEGATIVE    Staphylococcus aureus NEGATIVE  NEGATIVE   URINALYSIS, ROUTINE W REFLEX MICROSCOPIC   Collection Time   08/09/11  1:59 PM      Component Value Range   Color, Urine YELLOW  YELLOW    APPearance CLEAR  CLEAR    Specific Gravity, Urine >1.030 (*) 1.005 - 1.030    pH 5.0  5.0 - 8.0    Glucose, UA NEGATIVE  NEGATIVE (mg/dL)   Hgb urine dipstick NEGATIVE  NEGATIVE    Bilirubin Urine NEGATIVE  NEGATIVE    Ketones, ur NEGATIVE  NEGATIVE (mg/dL)   Protein, ur NEGATIVE  NEGATIVE (mg/dL)   Urobilinogen, UA 0.2  0.0 - 1.0 (mg/dL)   Nitrite NEGATIVE  NEGATIVE    Leukocytes, UA NEGATIVE  NEGATIVE   CBC   Collection Time   08/09/11  2:30 PM      Component Value Range   WBC 4.8  4.0 - 10.5 (K/uL)   RBC 3.36 (*) 3.87 - 5.11 (MIL/uL)   Hemoglobin 11.0 (*) 12.0 - 15.0 (g/dL)   HCT 96.0 (*) 45.4 - 46.0 (%)   MCV 94.6  78.0 - 100.0 (fL)   MCH 32.7  26.0 - 34.0 (pg)   MCHC 34.6  30.0 - 36.0 (g/dL)   RDW 09.8  11.9 - 14.7 (%)   Platelets 188  150 - 400 (K/uL)  COMPREHENSIVE METABOLIC PANEL   Collection Time   08/09/11  2:30 PM      Component Value Range   Sodium 140  135 - 145 (mEq/L)   Potassium 3.9  3.5 - 5.1 (mEq/L)   Chloride 106  96 - 112 (mEq/L)   CO2 27  19 - 32 (mEq/L)   Glucose, Bld 110 (*) 70 - 99 (mg/dL)   BUN 19  6 - 23 (mg/dL)   Creatinine, Ser 8.29  0.50 - 1.10 (mg/dL)   Calcium 9.9  8.4 - 56.2 (mg/dL)   Total Protein 6.7  6.0 - 8.3 (g/dL)   Albumin 3.5  3.5 - 5.2 (g/dL)   AST 24  0 - 37 (U/L)   ALT 16  0 - 35 (U/L)   Alkaline Phosphatase 99  39 - 117 (U/L)   Total Bilirubin 0.5  0.3 - 1.2 (mg/dL)   GFR calc non Af Amer 76 (*) >90  (mL/min)   GFR calc Af Amer 88 (*) >90 (mL/min)        Assessment/Plan: 1.  Endocervical lesion and inadequate colposcopy, needs tissue diagnosis both therapeutically and diagnostically  [name] [name] H 08/11/2011, 11:13 AM

## 2011-08-11 NOTE — Anesthesia Postprocedure Evaluation (Signed)
  Anesthesia Post-op Note  Patient: [name] [name]  Procedure(s) Performed:  CONIZATION CERVIX WITH BIOPSY - Laser Conization of Cervix  Patient Location: PACU  Anesthesia Type: General  Level of Consciousness: awake, alert  and oriented  Airway and Oxygen Therapy: Patient Spontanous Breathing  Post-op Pain: none  Post-op Assessment: Post-op Vital signs reviewed  Post-op Vital Signs: Reviewed and stable  Complications: No apparent anesthesia complications

## 2011-08-11 NOTE — Progress Notes (Signed)
No vaginal bleeding. No abdominal cramping.

## 2011-08-16 ENCOUNTER — Encounter (HOSPITAL_COMMUNITY): Payer: Self-pay | Admitting: Obstetrics & Gynecology

## 2011-09-03 ENCOUNTER — Other Ambulatory Visit: Payer: Self-pay | Admitting: *Deleted

## 2011-09-03 ENCOUNTER — Other Ambulatory Visit: Payer: Self-pay | Admitting: Cardiology

## 2011-09-03 MED ORDER — DIGOXIN 125 MCG PO TABS: 125.0000 ug | ORAL_TABLET | Freq: Every day | ORAL | Status: DC

## 2011-09-03 NOTE — Telephone Encounter (Signed)
Pt can not afford digoxin wants to know if we could switch her to something cheeper

## 2011-11-05 ENCOUNTER — Telehealth: Payer: Self-pay | Admitting: *Deleted

## 2011-11-05 NOTE — Telephone Encounter (Signed)
Patient called stating she is "having a panic attack" and cant breathe.  Wants to know what to do.  Advised her that I cannot give advise on this situation over the phone and the best thing for her to do is have someone take her to the ER.  Verbalized understanding.

## 2011-12-07 ENCOUNTER — Other Ambulatory Visit: Payer: Self-pay | Admitting: Cardiology

## 2011-12-07 MED ORDER — ATENOLOL 50 MG PO TABS: 50.0000 mg | ORAL_TABLET | Freq: Every day | ORAL | Status: DC

## 2011-12-16 ENCOUNTER — Other Ambulatory Visit: Payer: Self-pay

## 2011-12-16 DIAGNOSIS — R0602 Shortness of breath: Secondary | ICD-10-CM

## 2011-12-22 ENCOUNTER — Ambulatory Visit (HOSPITAL_COMMUNITY)
Admission: RE | Admit: 2011-12-22 | Discharge: 2011-12-22 | Disposition: A | Discharge status: Home / Self Care | Payer: BC Managed Care – PPO | Source: Ambulatory Visit | Attending: Pulmonary Disease | Admitting: Pulmonary Disease

## 2011-12-22 DIAGNOSIS — R0602 Shortness of breath: Secondary | ICD-10-CM | POA: Insufficient documentation

## 2011-12-22 MED ORDER — ALBUTEROL SULFATE (5 MG/ML) 0.5% IN NEBU
2.5000 mg | INHALATION_SOLUTION | Freq: Once | RESPIRATORY_TRACT | Status: AC
  Administered 2011-12-22: 2.5 mg via RESPIRATORY_TRACT

## 2011-12-24 NOTE — Procedures (Signed)
NAMEKATRENA, STEHLIN                ACCOUNT NO.:  [id number]  MEDICAL RECORD NO.:  [id number]  LOCATION:                                 FACILITY:  PHYSICIAN:  Sherlene Rickel L. Juanetta Gosling, M.D.DATE OF BIRTH:  [birth year]  DATE OF PROCEDURE:  12/23/2011 DATE OF DISCHARGE:                           PULMONARY FUNCTION TEST   Reason for pulmonary function testing is shortness of breath.  1. Spirometry shows a moderate-to-severe ventilatory defect with     evidence of airflow obstruction. 2. Lung volumes show mild reduction of total lung capacity and     probable air trapping. 3. DLCO is moderately reduced, and does correct some when ventilation     is taken into account. 4. Airway resistance is elevated confirming the presence of airflow     obstruction. 5. There is improvement with inhaled bronchodilator, but not to the     level of significance.     Elvia Aydin L. Juanetta Gosling, M.D.     [facility]/MEDQ  D:  12/23/2011  T:  12/23/2011  Job:  [id number]

## 2012-02-10 ENCOUNTER — Ambulatory Visit: Payer: PRIVATE HEALTH INSURANCE | Admitting: Cardiology

## 2012-02-15 ENCOUNTER — Ambulatory Visit (INDEPENDENT_AMBULATORY_CARE_PROVIDER_SITE_OTHER): Payer: PRIVATE HEALTH INSURANCE | Admitting: Adult Health

## 2012-02-15 ENCOUNTER — Encounter: Payer: Self-pay | Admitting: Adult Health

## 2012-02-15 VITALS — BP 118/64 | HR 80 | Ht 65.0 in | Wt 114.0 lb

## 2012-02-15 DIAGNOSIS — I10 Essential (primary) hypertension: Secondary | ICD-10-CM

## 2012-02-15 DIAGNOSIS — D649 Anemia, unspecified: Secondary | ICD-10-CM

## 2012-02-15 DIAGNOSIS — I4891 Unspecified atrial fibrillation: Secondary | ICD-10-CM

## 2012-02-15 NOTE — Assessment & Plan Note (Signed)
Currently well controlled on mediations. No changes at this time.

## 2012-02-15 NOTE — Assessment & Plan Note (Signed)
Most recent labs drawn on 02/02/2012 Hgb 10.8 and Hct 32.2. PLTs 300. BMET WNL.  Will continue to monitor this for need for iron supplements.

## 2012-02-15 NOTE — Assessment & Plan Note (Addendum)
She is rate controlled on current mediations.She is on ASA only with CHADS score of 1. No changes in her medications at this time.

## 2012-02-15 NOTE — Progress Notes (Addendum)
   HPI [name] [name] is a 56 y/o patient of Dr. Dietrich Pates we are following for ongoing assessment and assessment of PAF, with history of thyroid disease, anemia and anxiety. She has also been seen by Dr. Juanetta Gosling who has diagnosed her with COPD. She is currently on inhalers and xanax. She states she is feeling much better resting better and breathing better.She has occasional episodes of rapid HR that last a few seconds usually associated with caffeine use or stress. She denies chest pain or excessive DOE. She continues to work night shift at [employer], but has cut back on strenuous activity.    No Known Allergies  Current Outpatient Prescriptions  Medication Sig Dispense Refill  . ALPRAZolam (XANAX) 0.25 MG tablet Take 0.25 mg by mouth as needed.       Marland Kitchen aspirin 325 MG tablet Take 325 mg by mouth daily.        Marland Kitchen atenolol (TENORMIN) 50 MG tablet Take 1 tablet (50 mg total) by mouth daily.  30 tablet  6  . digoxin (LANOXIN) 0.125 MG tablet Take 1 tablet (125 mcg total) by mouth daily.  30 tablet  6  . diltiazem (CARDIZEM CD) 180 MG 24 hr capsule Take 1 capsule (180 mg total) by mouth daily.  90 capsule  3  . SPIRIVA HANDIHALER 18 MCG inhalation capsule Place 18 mcg into inhaler and inhale daily.       Marland Kitchen zolpidem (AMBIEN) 5 MG tablet Take 5 mg by mouth at bedtime as needed.         Past Medical History  Diagnosis Date  . Paroxysmal supraventricular tachycardia     Lost to followup-2006  . Atrial fibrillation     Echocardiogram in 2004-borderline LV function; no valvular abnormalities  . Right bundle branch block   . Anxiety   . Anemia     2011: Hemoglobin of 11.4; hematocrit of 33.5; normal MCV    Past Surgical History  Procedure Date  . Breast excisional biopsy     Left x2    ZOX:WRUEAV of systems complete and found to be negative unless listed above  PHYSICAL EXAM BP 118/64  Pulse 80  Ht 5\' 5"  (1.651 m)  Wt 114 lb (51.71 kg)  BMI 18.97 kg/m2 General: Well developed, well  nourished, in no acute distress Head: Eyes PERRLA, No xanthomas.   Normal cephalic and atramatic  Lungs: Clear bilaterally to auscultation and percussion. Heart: HRiR S1 S2, without MRG.  Pulses are 2+ & equal.            No carotid bruit. No JVD.  No abdominal bruits. No femoral bruits. Abdomen: Bowel sounds are positive, abdomen soft and non-tender without masses or                  Hernia's noted. Msk:  Back normal, normal gait. Normal strength and tone for age. Extremities: No clubbing, cyanosis or edema.  DP +1. Neuro: Alert and oriented X 3. Psych:  Good affect, responds appropriately     ASSESSMENT AND PLAN

## 2012-02-15 NOTE — Patient Instructions (Addendum)
**Note De-identified Gotti Alwin Obfuscation** Your physician recommends that you continue on your current medications as directed. Please refer to the Current Medication list given to you today.  Your physician recommends that you schedule a follow-up appointment in: 9 months  

## 2012-04-04 ENCOUNTER — Other Ambulatory Visit (HOSPITAL_COMMUNITY): Payer: Self-pay | Admitting: Pulmonary Disease

## 2012-04-04 ENCOUNTER — Ambulatory Visit (HOSPITAL_COMMUNITY)
Admission: RE | Admit: 2012-04-04 | Discharge: 2012-04-04 | Disposition: A | Discharge status: Home / Self Care | Payer: BC Managed Care – PPO | Source: Ambulatory Visit | Attending: Pulmonary Disease | Admitting: Pulmonary Disease

## 2012-04-04 DIAGNOSIS — S6990XA Unspecified injury of unspecified wrist, hand and finger(s), initial encounter: Secondary | ICD-10-CM | POA: Insufficient documentation

## 2012-06-02 DIAGNOSIS — I469 Cardiac arrest, cause unspecified: Secondary | ICD-10-CM

## 2012-06-02 HISTORY — DX: Cardiac arrest, cause unspecified: I46.9

## 2012-06-04 ENCOUNTER — Emergency Department (HOSPITAL_COMMUNITY): Payer: BC Managed Care – PPO

## 2012-06-04 ENCOUNTER — Encounter (HOSPITAL_COMMUNITY): Payer: Self-pay | Admitting: *Deleted

## 2012-06-04 ENCOUNTER — Inpatient Hospital Stay (HOSPITAL_COMMUNITY)
Admission: EM | Admit: 2012-06-04 | Discharge: 2012-06-09 | DRG: 544 | Disposition: A | Discharge status: Home Health | Payer: BC Managed Care – PPO | Attending: Pulmonary Disease | Admitting: Pulmonary Disease

## 2012-06-04 DIAGNOSIS — I4891 Unspecified atrial fibrillation: Secondary | ICD-10-CM

## 2012-06-04 DIAGNOSIS — Z79899 Other long term (current) drug therapy: Secondary | ICD-10-CM

## 2012-06-04 DIAGNOSIS — I5021 Acute systolic (congestive) heart failure: Principal | ICD-10-CM | POA: Diagnosis present

## 2012-06-04 DIAGNOSIS — G4700 Insomnia, unspecified: Secondary | ICD-10-CM | POA: Diagnosis not present

## 2012-06-04 DIAGNOSIS — F329 Major depressive disorder, single episode, unspecified: Secondary | ICD-10-CM | POA: Diagnosis present

## 2012-06-04 DIAGNOSIS — J449 Chronic obstructive pulmonary disease, unspecified: Secondary | ICD-10-CM | POA: Diagnosis present

## 2012-06-04 DIAGNOSIS — I509 Heart failure, unspecified: Secondary | ICD-10-CM | POA: Diagnosis present

## 2012-06-04 DIAGNOSIS — D649 Anemia, unspecified: Secondary | ICD-10-CM | POA: Diagnosis present

## 2012-06-04 DIAGNOSIS — Z23 Encounter for immunization: Secondary | ICD-10-CM

## 2012-06-04 DIAGNOSIS — E876 Hypokalemia: Secondary | ICD-10-CM | POA: Diagnosis not present

## 2012-06-04 DIAGNOSIS — J9600 Acute respiratory failure, unspecified whether with hypoxia or hypercapnia: Secondary | ICD-10-CM | POA: Diagnosis present

## 2012-06-04 DIAGNOSIS — F3289 Other specified depressive episodes: Secondary | ICD-10-CM | POA: Diagnosis present

## 2012-06-04 DIAGNOSIS — Z7982 Long term (current) use of aspirin: Secondary | ICD-10-CM

## 2012-06-04 DIAGNOSIS — E119 Type 2 diabetes mellitus without complications: Secondary | ICD-10-CM | POA: Diagnosis present

## 2012-06-04 DIAGNOSIS — F411 Generalized anxiety disorder: Secondary | ICD-10-CM | POA: Diagnosis present

## 2012-06-04 DIAGNOSIS — J441 Chronic obstructive pulmonary disease with (acute) exacerbation: Secondary | ICD-10-CM | POA: Diagnosis present

## 2012-06-04 DIAGNOSIS — I319 Disease of pericardium, unspecified: Secondary | ICD-10-CM | POA: Diagnosis present

## 2012-06-04 HISTORY — DX: Chronic obstructive pulmonary disease, unspecified: J44.9

## 2012-06-04 HISTORY — DX: Depression, unspecified: F32.A

## 2012-06-04 HISTORY — DX: Major depressive disorder, single episode, unspecified: F32.9

## 2012-06-04 LAB — BASIC METABOLIC PANEL
CO2: 22 mEq/L (ref 19–32)
Calcium: 9 mg/dL (ref 8.4–10.5)
GFR calc non Af Amer: 70 mL/min — ABNORMAL LOW (ref >90)
Potassium: 4 mEq/L (ref 3.5–5.1)
Sodium: 136 mEq/L (ref 135–145)

## 2012-06-04 LAB — CBC
Hemoglobin: 10.8 g/dL — ABNORMAL LOW (ref 12.0–15.0)
Platelets: 227 10*3/uL (ref 150–400)
RBC: 3.24 MIL/uL — ABNORMAL LOW (ref 3.87–5.11)
WBC: 7.3 10*3/uL (ref 4.0–10.5)

## 2012-06-04 LAB — URINALYSIS, ROUTINE W REFLEX MICROSCOPIC
Glucose, UA: NEGATIVE mg/dL
Leukocytes, UA: NEGATIVE
Protein, ur: NEGATIVE mg/dL
Specific Gravity, Urine: 1.015 (ref 1.005–1.030)
Urobilinogen, UA: 0.2 mg/dL (ref 0.0–1.0)

## 2012-06-04 LAB — TROPONIN I: Troponin I: <0.3 ng/mL (ref <0.30)

## 2012-06-04 LAB — URINE MICROSCOPIC-ADD ON

## 2012-06-04 MED ORDER — SODIUM CHLORIDE 0.9 % IJ SOLN
3.0000 mL | Freq: Two times a day (BID) | INTRAMUSCULAR | Status: DC
  Administered 2012-06-05 – 2012-06-06 (×2): 3 mL via INTRAVENOUS
  Filled 2012-06-04 (×2): qty 3

## 2012-06-04 MED ORDER — MORPHINE SULFATE 2 MG/ML IJ SOLN
2.0000 mg | INTRAMUSCULAR | Status: DC | PRN
  Administered 2012-06-05: 2 mg via INTRAVENOUS
  Filled 2012-06-04: qty 1

## 2012-06-04 MED ORDER — ATENOLOL 25 MG PO TABS
50.0000 mg | ORAL_TABLET | Freq: Every day | ORAL | Status: DC
  Administered 2012-06-05 – 2012-06-07 (×3): 50 mg via ORAL
  Filled 2012-06-04 (×3): qty 2

## 2012-06-04 MED ORDER — ENOXAPARIN SODIUM 40 MG/0.4ML ~~LOC~~ SOLN
40.0000 mg | SUBCUTANEOUS | Status: DC
  Administered 2012-06-05 – 2012-06-09 (×5): 40 mg via SUBCUTANEOUS
  Filled 2012-06-04 (×6): qty 0.4

## 2012-06-04 MED ORDER — DIGOXIN 250 MCG PO TABS
250.0000 ug | ORAL_TABLET | Freq: Every day | ORAL | Status: DC
  Administered 2012-06-05 – 2012-06-09 (×6): 250 ug via ORAL
  Filled 2012-06-04 (×6): qty 1

## 2012-06-04 MED ORDER — FUROSEMIDE 10 MG/ML IJ SOLN
40.0000 mg | Freq: Two times a day (BID) | INTRAMUSCULAR | Status: DC
  Administered 2012-06-04 – 2012-06-09 (×10): 40 mg via INTRAVENOUS
  Filled 2012-06-04 (×10): qty 4

## 2012-06-04 MED ORDER — ALBUTEROL SULFATE (5 MG/ML) 0.5% IN NEBU
5.0000 mg | INHALATION_SOLUTION | Freq: Once | RESPIRATORY_TRACT | Status: AC
  Administered 2012-06-04: 5 mg via RESPIRATORY_TRACT
  Filled 2012-06-04: qty 1

## 2012-06-04 MED ORDER — SODIUM CHLORIDE 0.9 % IV SOLN
INTRAVENOUS | Status: DC
  Administered 2012-06-04 (×2): via INTRAVENOUS

## 2012-06-04 MED ORDER — NITROGLYCERIN 2 % TD OINT
1.0000 [in_us] | TOPICAL_OINTMENT | Freq: Once | TRANSDERMAL | Status: AC
  Administered 2012-06-04: 1 [in_us] via TOPICAL

## 2012-06-04 MED ORDER — ALPRAZOLAM 0.5 MG PO TABS
0.5000 mg | ORAL_TABLET | Freq: Three times a day (TID) | ORAL | Status: DC | PRN
  Administered 2012-06-05 – 2012-06-08 (×7): 0.5 mg via ORAL
  Filled 2012-06-04 (×7): qty 1

## 2012-06-04 MED ORDER — IPRATROPIUM BROMIDE 0.02 % IN SOLN
0.5000 mg | Freq: Once | RESPIRATORY_TRACT | Status: AC
  Administered 2012-06-04: 0.5 mg via RESPIRATORY_TRACT
  Filled 2012-06-04: qty 2.5

## 2012-06-04 MED ORDER — SERTRALINE HCL 50 MG PO TABS
50.0000 mg | ORAL_TABLET | Freq: Every day | ORAL | Status: DC
  Administered 2012-06-05 – 2012-06-09 (×5): 50 mg via ORAL
  Filled 2012-06-04 (×5): qty 1

## 2012-06-04 MED ORDER — METHYLPREDNISOLONE SODIUM SUCC 125 MG IJ SOLR
125.0000 mg | Freq: Four times a day (QID) | INTRAMUSCULAR | Status: DC
  Administered 2012-06-04 – 2012-06-07 (×10): 125 mg via INTRAVENOUS
  Filled 2012-06-04 (×10): qty 2

## 2012-06-04 MED ORDER — MOXIFLOXACIN HCL IN NACL 400 MG/250ML IV SOLN
400.0000 mg | INTRAVENOUS | Status: DC
  Administered 2012-06-04 – 2012-06-06 (×3): 400 mg via INTRAVENOUS
  Filled 2012-06-04 (×3): qty 250

## 2012-06-04 MED ORDER — NITROGLYCERIN 2 % TD OINT
TOPICAL_OINTMENT | TRANSDERMAL | Status: AC
  Administered 2012-06-04: 1 [in_us]
  Filled 2012-06-04: qty 1

## 2012-06-04 MED ORDER — TIOTROPIUM BROMIDE MONOHYDRATE 18 MCG IN CAPS
ORAL_CAPSULE | RESPIRATORY_TRACT | Status: AC
  Filled 2012-06-04: qty 5

## 2012-06-04 MED ORDER — FUROSEMIDE 10 MG/ML IJ SOLN
40.0000 mg | Freq: Once | INTRAMUSCULAR | Status: AC
  Administered 2012-06-04: 40 mg via INTRAVENOUS
  Filled 2012-06-04: qty 4

## 2012-06-04 MED ORDER — SODIUM CHLORIDE 0.9 % IV SOLN
250.0000 mL | INTRAVENOUS | Status: DC | PRN
  Administered 2012-06-05: 10 mL via INTRAVENOUS

## 2012-06-04 MED ORDER — ACETAMINOPHEN 325 MG PO TABS: 650.0000 mg | ORAL_TABLET | Freq: Four times a day (QID) | ORAL | Status: DC | PRN

## 2012-06-04 MED ORDER — ALBUTEROL SULFATE (5 MG/ML) 0.5% IN NEBU
2.5000 mg | INHALATION_SOLUTION | RESPIRATORY_TRACT | Status: DC
  Filled 2012-06-04: qty 0.5

## 2012-06-04 MED ORDER — HYDROCODONE-ACETAMINOPHEN 5-325 MG PO TABS: 1.0000 | ORAL_TABLET | ORAL | Status: DC | PRN

## 2012-06-04 MED ORDER — PANTOPRAZOLE SODIUM 40 MG PO TBEC
40.0000 mg | DELAYED_RELEASE_TABLET | Freq: Every day | ORAL | Status: DC
  Administered 2012-06-05 – 2012-06-09 (×5): 40 mg via ORAL
  Filled 2012-06-04 (×5): qty 1

## 2012-06-04 MED ORDER — ASPIRIN EC 325 MG PO TBEC
325.0000 mg | DELAYED_RELEASE_TABLET | Freq: Every day | ORAL | Status: DC
  Administered 2012-06-05 – 2012-06-07 (×3): 325 mg via ORAL
  Filled 2012-06-04 (×3): qty 1

## 2012-06-04 MED ORDER — SODIUM CHLORIDE 0.9 % IJ SOLN: 3.0000 mL | INTRAMUSCULAR | Status: DC | PRN

## 2012-06-04 MED ORDER — DILTIAZEM HCL ER COATED BEADS 180 MG PO CP24
180.0000 mg | ORAL_CAPSULE | Freq: Every day | ORAL | Status: DC
  Administered 2012-06-05: 180 mg via ORAL
  Filled 2012-06-04: qty 1

## 2012-06-04 MED ORDER — MOXIFLOXACIN HCL IN NACL 400 MG/250ML IV SOLN
INTRAVENOUS | Status: AC
  Filled 2012-06-04: qty 250

## 2012-06-04 MED ORDER — ACETAMINOPHEN 650 MG RE SUPP: 650.0000 mg | Freq: Four times a day (QID) | RECTAL | Status: DC | PRN

## 2012-06-04 MED ORDER — TIOTROPIUM BROMIDE MONOHYDRATE 18 MCG IN CAPS
18.0000 ug | ORAL_CAPSULE | Freq: Every day | RESPIRATORY_TRACT | Status: DC
  Administered 2012-06-05 – 2012-06-09 (×4): 18 ug via RESPIRATORY_TRACT
  Filled 2012-06-04 (×2): qty 5

## 2012-06-04 NOTE — ED Notes (Signed)
Pt c/o decreased energy and weakness x 2 wks. Pt also c/o diarrhea.

## 2012-06-04 NOTE — H&P (Signed)
[name] [name] MRN: [id number] DOB/AGE: 1956-02-21 56 y.o. Primary Care Physician:Albion Weatherholtz L, MD Admit date: 06/04/2012 Chief Complaint: Shortness of breath HPI: This is a 56 year old Caucasian female who has had increasing shortness of breath over the last 2 weeks or so. She has been struggling with COPD for several years and has had more trouble recently. She does not have a known diagnosis of CHF but when she came to the emergency room she had a very high BNP level. Her chest x-ray was also suggested that she had some pulmonary edema. She had a cardiac workup she thinks within the last 2 years and was told that she had an irregular heartbeat that was not atrial fibrillation but he is unaware if she had any other diagnoses such as CHF or if she had a echocardiogram. She has been struggling to get her work done and I been trying to get her out of work on disability because of her COPD. She has heterozygous alpha-1 anti-trypsin deficiency and has COPD despite never having smoked cigarettes. She did not want to undergo treatment for alpha-1 antitrypsin deficiency  Past Medical History  Diagnosis Date  . Dysrhythmia   . COPD (chronic obstructive pulmonary disease)   . Depression    Past Surgical History  Procedure Date  . Breast biopsy     left, [facility]  . Cervical conization w/bx 08/11/2011    Procedure: CONIZATION CERVIX WITH BIOPSY;  Surgeon: Lazaro Arms, MD;  Location: [facility];  Service: Gynecology;  Laterality: N/A;  Laser Conization of Cervix        Family History  Problem Relation Age of Onset  . Anesthesia problems Neg Hx   . Hypotension Neg Hx   . Malignant hyperthermia Neg Hx   . Pseudochol deficiency Neg Hx     Social History:  reports that she has never smoked. She does not have any smokeless tobacco history on file. She reports that she does not drink alcohol or use illicit drugs.   Allergies: No Known Allergies   (Not in a hospital admission)     WUJ:WJXBJ from  the symptoms mentioned above,there are no other symptoms referable to all systems reviewed.  Physical Exam: Blood pressure 148/116, pulse 120, temperature 98.1 F (36.7 C), resp. rate 35, height 5\' 5"  (1.651 m), weight 52.164 kg (115 lb), SpO2 95.00%. She is awake and alert. She is currently on BiPAP. Her pupils are reactive. Her nose and throat are clear. She looks short of breath at rest. Her mucous membranes are moist. Her neck is supple without JVD. She has rales in the bases of both lungs bilaterally. Her heart is regular and I do not hear a gallop. Her abdomen is soft without masses. She does not have any peripheral edema. Central nervous system examination is grossly intact    Basename 06/04/12 1943  WBC 7.3  NEUTROABS --  HGB 10.8*  HCT 32.6*  MCV 100.6*  PLT 227    Basename 06/04/12 1943  NA 136  K 4.0  CL 102  CO2 22  GLUCOSE 84  BUN 23  CREATININE 0.90  CALCIUM 9.0  MG --  lablast2(ast:2,ALT:2,alkphos:2,bilitot:2,prot:2,albumin:2)@    No results found for this or any previous visit (from the past 240 hour(s)).   Dg Chest Port 1 View  06/04/2012  *RADIOLOGY REPORT*  Clinical Data: Cough.  Shortness of breath.  PORTABLE CHEST - 1 VIEW 06/04/2012 1958 hours:  Comparison: None.  Findings: Cardiac silhouette moderately to markedly enlarged. Diffuse interstitial and  airspace pulmonary edema.  Bilateral pleural effusions and associated passive atelectasis in the lower lobes.  IMPRESSION: CHF, with moderate diffuse interstitial and airspace pulmonary edema.  Bilateral pleural effusions with associated passive atelectasis in the lower lobes.   Original Report Authenticated By: Hulan Saas, M.D.    Impression: She has severe COPD. She has what appears to be CHF based on chest x-ray findings and BNP. This is a new diagnosis. She denies any chest pain but will check troponins. Active Problems:  * No active hospital problems. *      Plan: She will be admitted to the  intensive care unit. She'll be placed on BiPAP as needed. I will start diuresis. She will have cardiology consultation. She will be on standard treatment for COPD      Dominica Kent L Pager [phone]  06/04/2012, 9:39 PM

## 2012-06-04 NOTE — ED Provider Notes (Signed)
History     CSN: [id number]  Arrival date & time 06/04/12  Mikle Bosworth   First MD Initiated Contact with Patient 06/04/12 1934      Chief Complaint  Patient presents with  . Fatigue  . Shortness of Breath    HPI Pt was seen at 1950.  Per pt, c/o gradual onset and worsening of persistent SOB and generalized fatigue/weakness for the past 2 weeks.  Symptoms worsen on exertion.  Pt states she has been walking shorter distances than usual because she "has to stop to catch my breath." States she has been occasionally waking up at night "to sit up on the side of the bed to breathe deep."  States she also has been hearing herself "wheeze."  Denies fevers, no CP/palpitations, no cough, no back pain, no abd pain, no N/V/D.      Past Medical History  Diagnosis Date  . Dysrhythmia   . COPD (chronic obstructive pulmonary disease)   . Depression     Past Surgical History  Procedure Date  . Breast biopsy     left, [facility]  . Cervical conization w/bx 08/11/2011    Procedure: CONIZATION CERVIX WITH BIOPSY;  Surgeon: Lazaro Arms, MD;  Location: [facility];  Service: Gynecology;  Laterality: N/A;  Laser Conization of Cervix    Family History  Problem Relation Age of Onset  . Anesthesia problems Neg Hx   . Hypotension Neg Hx   . Malignant hyperthermia Neg Hx   . Pseudochol deficiency Neg Hx     History  Substance Use Topics  . Smoking status: Never Smoker   . Smokeless tobacco: Not on file  . Alcohol Use: No      Review of Systems ROS: Statement: All systems negative except as marked or noted in the HPI; Constitutional: +generalized fatigue/weakness. Negative for fever and chills. ; ; Eyes: Negative for eye pain, redness and discharge. ; ; ENMT: Negative for ear pain, hoarseness, nasal congestion, sinus pressure and sore throat. ; ; Cardiovascular: Negative for chest pain, palpitations, diaphoresis, and peripheral edema. ; ; Respiratory: +SOB, wheezing. Negative for cough and stridor. ; ;  Gastrointestinal: Negative for nausea, vomiting, diarrhea, abdominal pain, blood in stool, hematemesis, jaundice and rectal bleeding. . ; ; Genitourinary: Negative for dysuria, flank pain and hematuria. ; ; Musculoskeletal: Negative for back pain and neck pain. Negative for swelling and trauma.; ; Skin: Negative for pruritus, rash, abrasions, blisters, bruising and skin lesion.; ; Neuro: Negative for headache, lightheadedness and neck stiffness. Negative for altered level of consciousness , altered mental status, extremity weakness, paresthesias, involuntary movement, seizure and syncope.       Allergies  Review of patient's allergies indicates no known allergies.  Home Medications   Current Outpatient Rx  Name  Route  Sig  Dispense  Refill  . ALPRAZOLAM 0.5 MG PO TABS   Oral   Take 0.5 mg by mouth daily as needed. Nerves/anxiety         . ASPIRIN 325 MG PO TBEC   Oral   Take 325 mg by mouth every morning.           . ATENOLOL 50 MG PO TABS   Oral   Take 50 mg by mouth every morning.           Marland Kitchen DIGOXIN 0.25 MG PO TABS   Oral   Take 250 mcg by mouth every morning.           Marland Kitchen DILTIAZEM HCL ER COATED  BEADS 180 MG PO CP24   Oral   Take 180 mg by mouth every morning.           Marland Kitchen SERTRALINE HCL 50 MG PO TABS   Oral   Take 50 mg by mouth daily.         Marland Kitchen TIOTROPIUM BROMIDE MONOHYDRATE 18 MCG IN CAPS   Inhalation   Place 18 mcg into inhaler and inhale daily.           BP 135/104  Pulse 101  Temp 98.1 F (36.7 C)  Resp 22  Ht 5\' 5"  (1.651 m)  Wt 115 lb (52.164 kg)  BMI 19.14 kg/m2  SpO2 95%  Physical Exam 1956: Physical examination:  Nursing notes reviewed; Vital signs and O2 SAT reviewed;  Constitutional: Well developed, Well nourished, In no acute distress; Head:  Normocephalic, atraumatic; Eyes: EOMI, PERRL, No scleral icterus; ENMT: Mouth and pharynx normal, Mucous membranes dry; Neck: Supple, Full range of motion, No lymphadenopathy; Cardiovascular:  Irregular irregular rate and rhythm, +gallop; Respiratory: Breath sounds coarse & equal bilaterally, with bibasilar crackles, no wheezing. Speaking full sentences, Normal respiratory effort/excursion; Chest: Nontender, Movement normal; Abdomen: Soft, Nontender, Nondistended, Normal bowel sounds; Genitourinary: No CVA tenderness; Extremities: Pulses normal, No tenderness, No edema, No calf edema or asymmetry.; Neuro: AA&Ox3, Major CN grossly intact.  Speech clear. No gross focal motor or sensory deficits in extremities.; Skin: Color pale, Warm, Dry.   ED Course  Procedures   2005:  Pt states she feels like her "COPD is acting up." Pt is intermittently pursed lip breathing.  Will dose neb as workup progresses. Pt without hx of CHF, but HPI concerning for same; will check BNP.  2045:  Neb completed.  Sats dropped to 86% R/A.  CXR with CHF/pulmonary edema and BNP significantly elevated. IV lasix given, ntg paste placed.  Will start bipap.  Monitor continues afib, rate 60-80's, SBP stable in 130's.  Dx and testing d/w pt and family.  Questions answered.  Verb understanding, agreeable to admit.  2100:  T/C to Dr. Juanetta Gosling, case discussed, including:  HPI, pertinent PM/SHx, VS/PE, dx testing, ED course and treatment:  Agreeable to admit, he will be in to eval.   2130:  Pt appears much more comfortable on Bipap.  Smiling and trying to talk with her family, saying she "feels better now."  Resps without distress, Sats 97%. SBP stable 130's, HR 90-100's, afib on monitor.  Dr. Juanetta Gosling has already eval to admit, orders written.    MDM  MDM Reviewed: previous chart, nursing note and vitals Reviewed previous: ECG and labs Interpretation: labs, x-ray and ECG Total time providing critical care: 30-74 minutes. This excludes time spent performing separately reportable procedures and services. Consults: admitting MD   CRITICAL CARE Performed by: Laray Anger Total critical care time: 45 Critical care  time was exclusive of separately billable procedures and treating other patients. Critical care was necessary to treat or prevent imminent or life-threatening deterioration. Critical care was time spent personally by me on the following activities: development of treatment plan with patient and/or surrogate as well as nursing, discussions with consultants, evaluation of patient's response to treatment, examination of patient, obtaining history from patient or surrogate, ordering and performing treatments and interventions, ordering and review of laboratory studies, ordering and review of radiographic studies, pulse oximetry and re-evaluation of patient's condition.   Date: 06/04/2012  Rate: 85  Rhythm: atrial fibrillation and premature ventricular contractions (PVC)  QRS Axis: left  Intervals: normal  ST/T Wave abnormalities: normal  Conduction Disutrbances:right bundle branch block  Narrative Interpretation:   Old EKG Reviewed: unchanged; no significant changes from previous EKG dated 08/09/2011.  Results for orders placed during the hospital encounter of 06/04/12  URINALYSIS, ROUTINE W REFLEX MICROSCOPIC      Component Value Range   Color, Urine STRAW (*) YELLOW   APPearance CLEAR  CLEAR   Specific Gravity, Urine 1.015  1.005 - 1.030   pH 5.5  5.0 - 8.0   Glucose, UA NEGATIVE  NEGATIVE mg/dL   Hgb urine dipstick TRACE (*) NEGATIVE   Bilirubin Urine NEGATIVE  NEGATIVE   Ketones, ur NEGATIVE  NEGATIVE mg/dL   Protein, ur NEGATIVE  NEGATIVE mg/dL   Urobilinogen, UA 0.2  0.0 - 1.0 mg/dL   Nitrite NEGATIVE  NEGATIVE   Leukocytes, UA NEGATIVE  NEGATIVE  BASIC METABOLIC PANEL      Component Value Range   Sodium 136  135 - 145 mEq/L   Potassium 4.0  3.5 - 5.1 mEq/L   Chloride 102  96 - 112 mEq/L   CO2 22  19 - 32 mEq/L   Glucose, Bld 84  70 - 99 mg/dL   BUN 23  6 - 23 mg/dL   Creatinine, Ser 1.61  0.50 - 1.10 mg/dL   Calcium 9.0  8.4 - 09.6 mg/dL   GFR calc non Af Amer 70 (*) >90  mL/min   GFR calc Af Amer 81 (*) >90 mL/min  CBC      Component Value Range   WBC 7.3  4.0 - 10.5 K/uL   RBC 3.24 (*) 3.87 - 5.11 MIL/uL   Hemoglobin 10.8 (*) 12.0 - 15.0 g/dL   HCT 04.5 (*) 40.9 - 81.1 %   MCV 100.6 (*) 78.0 - 100.0 fL   MCH 33.3  26.0 - 34.0 pg   MCHC 33.1  30.0 - 36.0 g/dL   RDW 91.4 (*) 78.2 - 95.6 %   Platelets 227  150 - 400 K/uL  TROPONIN I      Component Value Range   Troponin I <0.30  <0.30 ng/mL  PRO B NATRIURETIC PEPTIDE      Component Value Range   Pro B Natriuretic peptide (BNP) 25466.0 (*) 0 - 125 pg/mL  DIGOXIN LEVEL      Component Value Range   Digoxin Level 0.8  0.8 - 2.0 ng/mL  URINE MICROSCOPIC-ADD ON      Component Value Range   Squamous Epithelial / LPF FEW (*) RARE   WBC, UA 3-6  <3 WBC/hpf   RBC / HPF 3-6  <3 RBC/hpf   Dg Chest Port 1 View 06/04/2012  *RADIOLOGY REPORT*  Clinical Data: Cough.  Shortness of breath.  PORTABLE CHEST - 1 VIEW 06/04/2012 1958 hours:  Comparison: None.  Findings: Cardiac silhouette moderately to markedly enlarged. Diffuse interstitial and airspace pulmonary edema.  Bilateral pleural effusions and associated passive atelectasis in the lower lobes.  IMPRESSION: CHF, with moderate diffuse interstitial and airspace pulmonary edema.  Bilateral pleural effusions with associated passive atelectasis in the lower lobes.   Original Report Authenticated By: Hulan Saas, M.D.     Results for VERDINE, GRENFELL (MRN [id number]) as of 06/04/2012 21:49  Ref. Range 08/09/2011 14:30 06/04/2012 19:43  Hemoglobin Latest Range: 12.0-15.0 g/dL 46.9 (L) 62.9 (L)  HCT Latest Range: 36.0-46.0 % 31.8 (L) 32.6 (L)             Laray Anger, DO 06/06/12 1250

## 2012-06-05 ENCOUNTER — Encounter (HOSPITAL_COMMUNITY): Payer: Self-pay | Admitting: Adult Health

## 2012-06-05 DIAGNOSIS — J441 Chronic obstructive pulmonary disease with (acute) exacerbation: Secondary | ICD-10-CM

## 2012-06-05 DIAGNOSIS — I059 Rheumatic mitral valve disease, unspecified: Secondary | ICD-10-CM

## 2012-06-05 DIAGNOSIS — J449 Chronic obstructive pulmonary disease, unspecified: Secondary | ICD-10-CM | POA: Diagnosis present

## 2012-06-05 DIAGNOSIS — I4891 Unspecified atrial fibrillation: Secondary | ICD-10-CM

## 2012-06-05 DIAGNOSIS — F329 Major depressive disorder, single episode, unspecified: Secondary | ICD-10-CM | POA: Diagnosis present

## 2012-06-05 DIAGNOSIS — I509 Heart failure, unspecified: Secondary | ICD-10-CM

## 2012-06-05 LAB — BLOOD GAS, ARTERIAL
Bicarbonate: 22.7 mEq/L (ref 20.0–24.0)
O2 Saturation: 89.9 %
TCO2: 20.9 mmol/L (ref 0–100)

## 2012-06-05 LAB — MRSA PCR SCREENING: MRSA by PCR: NEGATIVE

## 2012-06-05 LAB — TROPONIN I
Troponin I: <0.3 ng/mL (ref <0.30)
Troponin I: <0.3 ng/mL (ref <0.30)

## 2012-06-05 LAB — BASIC METABOLIC PANEL
CO2: 24 mEq/L (ref 19–32)
Calcium: 8.6 mg/dL (ref 8.4–10.5)
Chloride: 101 mEq/L (ref 96–112)
GFR calc Af Amer: 88 mL/min — ABNORMAL LOW (ref >90)
Sodium: 137 mEq/L (ref 135–145)

## 2012-06-05 MED ORDER — DILTIAZEM HCL 100 MG IV SOLR
5.0000 mg/h | INTRAVENOUS | Status: DC
  Administered 2012-06-05 – 2012-06-06 (×3): 10 mg/h via INTRAVENOUS
  Administered 2012-06-06: 14 mg/h via INTRAVENOUS
  Administered 2012-06-06: 10 mg/h via INTRAVENOUS
  Administered 2012-06-06: 12 mg/h via INTRAVENOUS
  Filled 2012-06-05: qty 100

## 2012-06-05 MED ORDER — PNEUMOCOCCAL VAC POLYVALENT 25 MCG/0.5ML IJ INJ
0.5000 mL | INJECTION | INTRAMUSCULAR | Status: AC
  Administered 2012-06-06: 0.5 mL via INTRAMUSCULAR
  Filled 2012-06-05: qty 0.5

## 2012-06-05 MED ORDER — POTASSIUM CHLORIDE 20 MEQ PO PACK
PACK | ORAL | Status: AC
  Filled 2012-06-05: qty 1

## 2012-06-05 MED ORDER — POTASSIUM CHLORIDE 20 MEQ PO PACK
20.0000 meq | PACK | Freq: Two times a day (BID) | ORAL | Status: DC
  Administered 2012-06-05 (×2): 20 meq via ORAL
  Filled 2012-06-05 (×5): qty 1

## 2012-06-05 MED ORDER — INFLUENZA VIRUS VACC SPLIT PF IM SUSP
0.5000 mL | INTRAMUSCULAR | Status: AC
  Administered 2012-06-06: 0.5 mL via INTRAMUSCULAR
  Filled 2012-06-05: qty 0.5

## 2012-06-05 MED ORDER — LEVALBUTEROL HCL 0.63 MG/3ML IN NEBU
0.6300 mg | INHALATION_SOLUTION | RESPIRATORY_TRACT | Status: DC
  Administered 2012-06-05 – 2012-06-09 (×21): 0.63 mg via RESPIRATORY_TRACT
  Filled 2012-06-05 (×19): qty 3

## 2012-06-05 MED ORDER — LEVALBUTEROL HCL 0.63 MG/3ML IN NEBU
INHALATION_SOLUTION | RESPIRATORY_TRACT | Status: AC
  Administered 2012-06-05: 0.63 mg
  Filled 2012-06-05: qty 3

## 2012-06-05 NOTE — Progress Notes (Signed)
UR Chart Review Completed  

## 2012-06-05 NOTE — Progress Notes (Signed)
*  PRELIMINARY RESULTS* Echocardiogram 2D Echocardiogram has been performed.  [name] [name] 06/05/2012, 11:34 AM

## 2012-06-05 NOTE — Progress Notes (Signed)
Subjective: She was admitted yesterday with respiratory failure and new onset of CHF/pulmonary edema. She says she feels better. She's in atrial fibrillation with a rapid ventricular response. Her heart rates about 130. When she came into the emergency room and when I saw her her heart rate initially was about 110 but after she was treated had settled down to approximately 100. She got short of breath when she got up to go to the bathroom so she has a Foley catheter now  Objective: Vital signs in last 24 hours: Temp:  [98.1 F (36.7 C)-98.6 F (37 C)] 98.6 F (37 C) (11/03 2300) Pulse Rate:  [44-147] 87  (11/04 0630) Resp:  [16-35] 18  (11/04 0630) BP: (105-148)/(72-116) 114/81 mmHg (11/04 0630) SpO2:  [89 %-100 %] 100 % (11/04 0600) FiO2 (%):  [50 %-100 %] 50 % (11/04 0530) Weight:  [52.164 kg (115 lb)-55.8 kg (123 lb 0.3 oz)] 55.8 kg (123 lb 0.3 oz) (11/03 2237) Weight change:  Last BM Date: 06/04/12  Intake/Output from previous day: 11/03 0701 - 11/04 0700 In: 564.8 [I.V.:310.8; IV Piggyback:254] Out: 2950 [Urine:2950]  PHYSICAL EXAM General appearance: alert, cooperative and mild distress Resp: rhonchi bilaterally Cardio: irregularly irregular rhythm GI: soft, non-tender; bowel sounds normal; no masses,  no organomegaly Extremities: extremities normal, atraumatic, no cyanosis or edema  Lab Results:    Basic Metabolic Panel:  Basename 06/05/12 0147 06/04/12 1943  NA 137 136  K 3.7 4.0  CL 101 102  CO2 24 22  GLUCOSE 141* 84  BUN 22 23  CREATININE 0.84 0.90  CALCIUM 8.6 9.0  MG -- --  PHOS -- --   Liver Function Tests: No results found for this basename: AST:2,ALT:2,ALKPHOS:2,BILITOT:2,PROT:2,ALBUMIN:2 in the last 72 hours No results found for this basename: LIPASE:2,AMYLASE:2 in the last 72 hours No results found for this basename: AMMONIA:2 in the last 72 hours CBC:  Basename 06/04/12 1943  WBC 7.3  NEUTROABS --  HGB 10.8*  HCT 32.6*  MCV 100.6*  PLT  227   Cardiac Enzymes:  Basename 06/05/12 0147 06/04/12 1943  CKTOTAL -- --  CKMB -- --  CKMBINDEX -- --  TROPONINI <0.30 <0.30   BNP:  Basename 06/04/12 1943  PROBNP 25466.0*   D-Dimer: No results found for this basename: DDIMER:2 in the last 72 hours CBG: No results found for this basename: GLUCAP:6 in the last 72 hours Hemoglobin A1C: No results found for this basename: HGBA1C in the last 72 hours Fasting Lipid Panel: No results found for this basename: CHOL,HDL,LDLCALC,TRIG,CHOLHDL,LDLDIRECT in the last 72 hours Thyroid Function Tests: No results found for this basename: TSH,T4TOTAL,FREET4,T3FREE,THYROIDAB in the last 72 hours Anemia Panel: No results found for this basename: VITAMINB12,FOLATE,FERRITIN,TIBC,IRON,RETICCTPCT in the last 72 hours Coagulation: No results found for this basename: LABPROT:2,INR:2 in the last 72 hours Urine Drug Screen: Drugs of Abuse  No results found for this basename: labopia, cocainscrnur, labbenz, amphetmu, thcu, labbarb    Alcohol Level: No results found for this basename: ETH:2 in the last 72 hours Urinalysis:  Basename 06/04/12 2130  COLORURINE STRAW*  LABSPEC 1.015  PHURINE 5.5  GLUCOSEU NEGATIVE  HGBUR TRACE*  BILIRUBINUR NEGATIVE  KETONESUR NEGATIVE  PROTEINUR NEGATIVE  UROBILINOGEN 0.2  NITRITE NEGATIVE  LEUKOCYTESUR NEGATIVE   Misc. Labs:  ABGS  Basename 06/05/12 0515  PHART 7.459*  PO2ART 57.7*  TCO2 20.9  HCO3 22.7   CULTURES No results found for this or any previous visit (from the past 240 hour(s)). Studies/Results: Dg Chest Port 1  View  06/04/2012  *RADIOLOGY REPORT*  Clinical Data: Cough.  Shortness of breath.  PORTABLE CHEST - 1 VIEW 06/04/2012 1958 hours:  Comparison: None.  Findings: Cardiac silhouette moderately to markedly enlarged. Diffuse interstitial and airspace pulmonary edema.  Bilateral pleural effusions and associated passive atelectasis in the lower lobes.  IMPRESSION: CHF, with moderate  diffuse interstitial and airspace pulmonary edema.  Bilateral pleural effusions with associated passive atelectasis in the lower lobes.   Original Report Authenticated By: Hulan Saas, M.D.     Medications:  Prior to Admission:  Prescriptions prior to admission  Medication Sig Dispense Refill  . ALPRAZolam (XANAX) 0.5 MG tablet Take 0.5 mg by mouth daily as needed. Nerves/anxiety      . aspirin 325 MG EC tablet Take 325 mg by mouth every morning.        Marland Kitchen atenolol (TENORMIN) 50 MG tablet Take 50 mg by mouth every morning.        . digoxin (LANOXIN) 0.25 MG tablet Take 250 mcg by mouth every morning.        . diltiazem (CARDIZEM CD) 180 MG 24 hr capsule Take 180 mg by mouth every morning.        . sertraline (ZOLOFT) 50 MG tablet Take 50 mg by mouth daily.      Marland Kitchen tiotropium (SPIRIVA) 18 MCG inhalation capsule Place 18 mcg into inhaler and inhale daily.       Scheduled:   . [COMPLETED] albuterol  5 mg Nebulization Once  . aspirin  325 mg Oral Daily  . atenolol  50 mg Oral Q breakfast  . digoxin  250 mcg Oral Daily  . enoxaparin (LOVENOX) injection  40 mg Subcutaneous Q24H  . [COMPLETED] furosemide  40 mg Intravenous Once  . furosemide  40 mg Intravenous Q12H  . influenza  inactive virus vaccine  0.5 mL Intramuscular Tomorrow-1000  . [COMPLETED] ipratropium  0.5 mg Nebulization Once  . [COMPLETED] levalbuterol      . levalbuterol  0.63 mg Nebulization Q4H  . methylPREDNISolone (SOLU-MEDROL) injection  125 mg Intravenous Q6H  . moxifloxacin  400 mg Intravenous Q24H  . [COMPLETED] nitroGLYCERIN  1 inch Topical Once  . [COMPLETED] nitroGLYCERIN      . pantoprazole  40 mg Oral Daily  . pneumococcal 23 valent vaccine  0.5 mL Intramuscular Tomorrow-1000  . sertraline  50 mg Oral Daily  . sodium chloride  3 mL Intravenous Q12H  . tiotropium  18 mcg Inhalation Daily  . [DISCONTINUED] albuterol  2.5 mg Nebulization Q4H  . [DISCONTINUED] diltiazem  180 mg Oral Daily   Continuous:   .  diltiazem (CARDIZEM) infusion 10 mg/hr (06/05/12 0804)  . [DISCONTINUED] sodium chloride 50 mL/hr at 06/04/12 2347   ZOX:WRUEAV chloride, acetaminophen, acetaminophen, ALPRAZolam, HYDROcodone-acetaminophen, morphine injection, sodium chloride  Assesment: She has severe COPD. She has CHF. She is better. She is in atrial fibrillation with rapid ventricular response Active Problems:  * No active hospital problems. *     Plan: I'm going to switch her to IV Cardizem. She will continue her treatments for her COPD. She will continue intravenous Lasix. She is to have an echocardiogram. She will have cardiology consultation    LOS: 1 day   Margarete Horace L 06/05/2012, 8:15 AM

## 2012-06-05 NOTE — Consult Note (Signed)
CARDIOLOGY CONSULT NOTE  Patient ID: [name] [name] MRN: [id number] DOB/AGE: [birth year] 56 y.o.  Admit date: 06/04/2012 Referring Physician: Juanetta Gosling Primary Herb Grays, MD Primary CardiologistL:[place] Reason for Consultation: Atrial Fibrillation/CHF Principal Problem:  *CHF (congestive heart failure) Active Problems:  Atrial fibrillation with rapid ventricular response  COPD exacerbation  Anxiety  Depression  HPI: 56 y/o patient of Dr. Dietrich Pates with known history of "irregular HR" but no diagnosis of Atrial fib in the past, admitted with acute respiratory failure and pulmonary edema. Was found to be in rapid atrial fib 110-120 bpm. CXR revealed bilateral pleural effusions and moderate diffuse interstial and airspace disease.  Blood pressure range form 130/100 to 148/116 [place] [address]. Troponin < 0.30 She was treated with albuterol neb, atrovent, lasix 40 mg IV and NTG. Placed on BiPAP. In ICU she was placed on cardizem drip, currently at 10 mg hr.    She states that she has been having more trouble with shortness of breath over the last several weeks, but complains of generalized weakness and fatigue which has been most prominent. She denies rapid HR, but states she can sometimes feel her heart beating in her neck. She denies LEE, but does admit to early satiety for the last couple of weeks. She has diuresed approx 2385 cc, with foley catheter in place.   Review of systems complete and found to be negative unless listed above   Past Medical History  Diagnosis Date  . Dysrhythmia   . COPD (chronic obstructive pulmonary disease)   . Depression     Family History  Problem Relation Age of Onset  . Anesthesia problems Neg Hx   . Hypotension Neg Hx   . Malignant hyperthermia Neg Hx   . Pseudochol deficiency Neg Hx     History   Social History  . Marital Status: Divorced    Spouse Name: N/A    Number of Children: N/A  . Years of Education: N/A   Occupational  History  . Surveyor, quantity   Social History Main Topics  . Smoking status: Never Smoker   . Smokeless tobacco: Not on file  . Alcohol Use: No  . Drug Use: No  . Sexually Active:    Other Topics Concern  . Not on file   Social History Narrative   Lives alone with good family support from sisters.     Past Surgical History  Procedure Date  . Breast biopsy     left, [facility]  . Cervical conization w/bx 08/11/2011    Procedure: CONIZATION CERVIX WITH BIOPSY;  Surgeon: Lazaro Arms, MD;  Location: [facility];  Service: Gynecology;  Laterality: N/A;  Laser Conization of Cervix     Prescriptions prior to admission  Medication Sig Dispense Refill  . ALPRAZolam (XANAX) 0.5 MG tablet Take 0.5 mg by mouth daily as needed. Nerves/anxiety      . aspirin 325 MG EC tablet Take 325 mg by mouth every morning.        Marland Kitchen atenolol (TENORMIN) 50 MG tablet Take 50 mg by mouth every morning.        . digoxin (LANOXIN) 0.25 MG tablet Take 250 mcg by mouth every morning.        . diltiazem (CARDIZEM CD) 180 MG 24 hr capsule Take 180 mg by mouth every morning.        . sertraline (ZOLOFT) 50 MG tablet Take 50 mg by mouth daily.      Marland Kitchen  tiotropium (SPIRIVA) 18 MCG inhalation capsule Place 18 mcg into inhaler and inhale daily.        Physical Exam: Blood pressure 114/81, pulse 87, temperature 98.6 F (37 C), temperature source Oral, resp. rate 18, height 5\' 5"  (1.651 m), weight 123 lb 0.3 oz (55.8 kg), SpO2 100.00%.   General: Well developed, well nourished, in no acute distress with pursed lip breathing. Head: Eyes PERRLA, No xanthomas.   Normal cephalic and atramatic  Lungs: Diminished bibasilar to the middle lobes, clear in upper lobes. On O2 via [place]. Heart: Irregularly irregular with soft S3, intermittantly.  Pulses are 2+ & equal.            No carotid bruit. No JVD.  No abdominal bruits. No femoral bruits. Abdomen: Bowel sounds are positive, abdomen soft, mildly distended and non-tender without masses  or                  Hernia's noted. Msk:  Back normal, normal gait. Normal strength and tone for age. Extremities: No clubbing, cyanosis or edema.  DP +1 Neuro: Alert and oriented X 3. Psych:  Good affect, responds appropriately   Labs:   Lab Results  Component Value Date   WBC 7.3 06/04/2012   HGB 10.8* 06/04/2012   HCT 32.6* 06/04/2012   MCV 100.6* 06/04/2012   PLT 227 06/04/2012     Lab 06/05/12 0147  NA 137  K 3.7  CL 101  CO2 24  BUN 22  CREATININE 0.84  CALCIUM 8.6  PROT --  BILITOT --  ALKPHOS --  ALT --  AST --  GLUCOSE 141*   Lab Results  Component Value Date   TROPONINI <0.30 06/05/2012        Radiology: Dg Chest Port 1 View  06/04/2012  *RADIOLOGY REPORT*  Clinical Data: Cough.  Shortness of breath.  PORTABLE CHEST - 1 VIEW 06/04/2012 1958 hours:  Comparison: None.  Findings: Cardiac silhouette moderately to markedly enlarged. Diffuse interstitial and airspace pulmonary edema.  Bilateral pleural effusions and associated passive atelectasis in the lower lobes.  IMPRESSION: CHF, with moderate diffuse interstitial and airspace pulmonary edema.  Bilateral pleural effusions with associated passive atelectasis in the lower lobes.   Original Report Authenticated By: Hulan Saas, M.D.    ZOX:WRUEAV fib with rate of 84 bpm, with anterior lateral T-wave abnormalities.  ASSESSMENT AND PLAN:   1. Acute Respiratory Failure: Requiring BiPAP and nebulizer treatments. Now improved, O2 support via N/C at 4/L. Consider changing to Xopenex treatments due to elevated HR.   2. Acute CHF: CXR reveals cardiac silhouette moderately to markedly enlarged. Echo has been ordered to evaluate systolic function and pulmonary pressures.Review of past records do not show prior echo for comparison. Continue IV diureses with 40 mg IV lasix BID. She needs significantly more diureses, with probable 3-4 day stay, determined by patient's response to treatment. Echo will tell us more, concerning  prognosis. Will add potassium supplement to medication regimen and follow up with BMET in AM.   3. Acute Atrial fibrillation: Likely related to COPD exacerabation, but has had hx of irregular HR in the past.  CHAD's score of 2 for hypertension and CHF. She is now on enoxaparin 40 mg [place] Q 24 hours and ASA. She is mildly anemic which could be dilutional.  Cardizem is at 10 mg hour along with Dig at 250 mcgs daily. She is on atenolol 50 mg Daily. HR is only moderately controlled between 90 and 120 bpm. She  is not wheezing currently. Can consider increasing beta blocker for better HR control and cardiac output. This is likely related to steroids as well. Will make further recommendations once echo results are available. For now continue current treatment with changes based upon patient's response.   4. Anemia: Follow up H/H in am.Consider anemia profile.   Signed: Bettey Mare. Lyman Bishop NP [facility] 06/05/2012, 8:50 AM Co-Sign MD Patient seen and examined.  Agree with findings of K Lawrence Patient with history of afib and reported CHF (cannot find echo to document LVEF)  Presents with SOB.  Found to have COPD exacerbation, rapid afib and CHF On exam, JVP is incrased.  Lungs with decreased airflow.  Cardiac  Irregularly irreg.  Ext  No edema  2+ pulses  Agree with IV diuresis.  Strict I/Os  Treat COPD flare  For now use Dilt and Dig    Will review echo.   Follow H/H

## 2012-06-05 NOTE — Progress Notes (Signed)
Notified elink md concerning pt continued elevated heart rate. Md stated to go ahead and give daily po dose of cardizem and digoxin

## 2012-06-06 DIAGNOSIS — E119 Type 2 diabetes mellitus without complications: Secondary | ICD-10-CM | POA: Diagnosis present

## 2012-06-06 DIAGNOSIS — E876 Hypokalemia: Secondary | ICD-10-CM | POA: Diagnosis present

## 2012-06-06 LAB — BASIC METABOLIC PANEL
BUN: 22 mg/dL (ref 6–23)
BUN: 26 mg/dL — ABNORMAL HIGH (ref 6–23)
Calcium: 9 mg/dL (ref 8.4–10.5)
Calcium: 9.1 mg/dL (ref 8.4–10.5)
Chloride: 101 mEq/L (ref 96–112)
Creatinine, Ser: 0.8 mg/dL (ref 0.50–1.10)
Creatinine, Ser: 0.89 mg/dL (ref 0.50–1.10)
GFR calc Af Amer: >90 mL/min (ref >90)
GFR calc non Af Amer: 80 mL/min — ABNORMAL LOW (ref >90)
Glucose, Bld: 230 mg/dL — ABNORMAL HIGH (ref 70–99)
Potassium: 4.4 mEq/L (ref 3.5–5.1)

## 2012-06-06 LAB — HEMOGLOBIN A1C: Hgb A1c MFr Bld: 5.6 % (ref <5.7)

## 2012-06-06 LAB — PROTIME-INR: INR: 1.33 (ref 0.00–1.49)

## 2012-06-06 LAB — RETICULOCYTES: RBC.: 3.22 MIL/uL — ABNORMAL LOW (ref 3.87–5.11)

## 2012-06-06 LAB — GLUCOSE, CAPILLARY
Glucose-Capillary: 227 mg/dL — ABNORMAL HIGH (ref 70–99)
Glucose-Capillary: 163 mg/dL — ABNORMAL HIGH (ref 70–99)

## 2012-06-06 LAB — URINE CULTURE

## 2012-06-06 LAB — PRO B NATRIURETIC PEPTIDE: Pro B Natriuretic peptide (BNP): 7326 pg/mL — ABNORMAL HIGH (ref 0–125)

## 2012-06-06 LAB — IRON AND TIBC
Saturation Ratios: 12 % — ABNORMAL LOW (ref 20–55)
TIBC: 383 ug/dL (ref 250–470)

## 2012-06-06 LAB — VITAMIN B12: Vitamin B-12: 1424 pg/mL — ABNORMAL HIGH (ref 211–911)

## 2012-06-06 LAB — HEMOGLOBIN AND HEMATOCRIT, BLOOD
HCT: 31.8 % — ABNORMAL LOW (ref 36.0–46.0)
Hemoglobin: 10.8 g/dL — ABNORMAL LOW (ref 12.0–15.0)

## 2012-06-06 MED ORDER — METOPROLOL TARTRATE 1 MG/ML IV SOLN
3.0000 mg | Freq: Once | INTRAVENOUS | Status: AC
  Administered 2012-06-06: 3 mg via INTRAVENOUS
  Filled 2012-06-06: qty 5

## 2012-06-06 MED ORDER — INSULIN ASPART 100 UNIT/ML ~~LOC~~ SOLN
0.0000 [IU] | Freq: Every day | SUBCUTANEOUS | Status: DC
  Administered 2012-06-07: 2 [IU] via SUBCUTANEOUS

## 2012-06-06 MED ORDER — POTASSIUM CHLORIDE CRYS ER 20 MEQ PO TBCR
40.0000 meq | EXTENDED_RELEASE_TABLET | Freq: Three times a day (TID) | ORAL | Status: DC
  Administered 2012-06-06 – 2012-06-09 (×11): 40 meq via ORAL
  Filled 2012-06-06 (×2): qty 2
  Filled 2012-06-06: qty 1
  Filled 2012-06-06 (×9): qty 2

## 2012-06-06 MED ORDER — POTASSIUM CHLORIDE 10 MEQ/100ML IV SOLN
10.0000 meq | INTRAVENOUS | Status: AC
  Administered 2012-06-06 (×4): 10 meq via INTRAVENOUS
  Filled 2012-06-06: qty 400
  Filled 2012-06-06: qty 100

## 2012-06-06 MED ORDER — POTASSIUM CHLORIDE CRYS ER 20 MEQ PO TBCR
40.0000 meq | EXTENDED_RELEASE_TABLET | Freq: Once | ORAL | Status: AC
  Administered 2012-06-06: 40 meq via ORAL
  Filled 2012-06-06: qty 2

## 2012-06-06 MED ORDER — WARFARIN VIDEO
Freq: Once | Status: AC
  Administered 2012-06-06: 1

## 2012-06-06 MED ORDER — WARFARIN SODIUM 5 MG PO TABS
5.0000 mg | ORAL_TABLET | Freq: Once | ORAL | Status: AC
  Administered 2012-06-06: 5 mg via ORAL
  Filled 2012-06-06: qty 1

## 2012-06-06 MED ORDER — SIMETHICONE 80 MG PO CHEW
80.0000 mg | CHEWABLE_TABLET | Freq: Three times a day (TID) | ORAL | Status: DC | PRN
  Administered 2012-06-06 (×2): 80 mg via ORAL
  Filled 2012-06-06 (×2): qty 1

## 2012-06-06 MED ORDER — ZOLPIDEM TARTRATE 5 MG PO TABS
5.0000 mg | ORAL_TABLET | Freq: Once | ORAL | Status: AC
  Administered 2012-06-06: 5 mg via ORAL
  Filled 2012-06-06: qty 1

## 2012-06-06 MED ORDER — INSULIN GLARGINE 100 UNIT/ML ~~LOC~~ SOLN
5.0000 [IU] | Freq: Every day | SUBCUTANEOUS | Status: DC
  Administered 2012-06-06 – 2012-06-08 (×3): 5 [IU] via SUBCUTANEOUS

## 2012-06-06 MED ORDER — INSULIN ASPART 100 UNIT/ML ~~LOC~~ SOLN
0.0000 [IU] | Freq: Three times a day (TID) | SUBCUTANEOUS | Status: DC
Start: 2012-06-06 — End: 2012-06-09
  Administered 2012-06-06: 5 [IU] via SUBCUTANEOUS
  Administered 2012-06-06: 3 [IU] via SUBCUTANEOUS
  Administered 2012-06-07: 5 [IU] via SUBCUTANEOUS
  Administered 2012-06-07 – 2012-06-08 (×5): 2 [IU] via SUBCUTANEOUS
  Administered 2012-06-09 (×2): 3 [IU] via SUBCUTANEOUS
  Administered 2012-06-09: 2 [IU] via SUBCUTANEOUS

## 2012-06-06 MED ORDER — PATIENT'S GUIDE TO USING COUMADIN BOOK
Freq: Once | Status: AC
  Administered 2012-06-06: 12:00:00
  Filled 2012-06-06: qty 1

## 2012-06-06 MED ORDER — WARFARIN - PHARMACIST DOSING INPATIENT: Freq: Every day | Status: DC

## 2012-06-06 NOTE — Progress Notes (Signed)
Pt ambulated in hallway about 100 feet. Pt tolerated well with no complaints of feeling SOB or diizy/light headed. While walking HR ranged from 100-120, O2 98-100% on 2 liters [place]. Pt returned to room to chair. Pts son at bedside. More education done on Coumadin using teach back method, however pt needs reinforcement with education.

## 2012-06-06 NOTE — Progress Notes (Signed)
Pt complains of gas following lunch and requesting something for it. Dr Juanetta Gosling paged and made aware. New order received. Will continue to monitor.

## 2012-06-06 NOTE — Progress Notes (Signed)
eLink Physician-Brief Progress Note Patient Name: [name] [name] DOB: [birth year] MRN: [id number]  Date of Service  06/06/2012   HPI/Events of Note  Insomnia and HR 120-140 due to a fib   eICU Interventions  ambien 5mg  x 1 and observe   Intervention Category Major Interventions: Other:  Vickey Boak 06/06/2012, 12:47 AM

## 2012-06-06 NOTE — Progress Notes (Signed)
Subjective: She says she feels better. She is still in atrial fibrillation and her ventricular response is still somewhat rapid. She continues on IV diltiazem. Her potassium is  low this morning. Her blood sugar has been up.  Objective: Vital signs in last 24 hours: Temp:  [97.9 F (36.6 C)-98.3 F (36.8 C)] 98.3 F (36.8 C) (11/05 0000) Pulse Rate:  [54-147] 135  (11/05 0726) Resp:  [12-27] 12  (11/05 0600) BP: (106-141)/(50-83) 106/52 mmHg (11/05 0726) SpO2:  [94 %-99 %] 98 % (11/05 0710) Weight change:  Last BM Date: 06/04/12  Intake/Output from previous day: 11/04 0701 - 11/05 0700 In: 1929.9 [P.O.:960; I.V.:961.9; IV Piggyback:8] Out: 4050 [Urine:4050]  PHYSICAL EXAM General appearance: alert, cooperative and mild distress Resp: rhonchi bilaterally Cardio: irregularly irregular rhythm GI: soft, non-tender; bowel sounds normal; no masses,  no organomegaly Extremities: extremities normal, atraumatic, no cyanosis or edema  Lab Results:    Basic Metabolic Panel:  Basename 06/06/12 0440 06/05/12 0147  NA 140 137  K 2.9* 3.7  CL 101 101  CO2 27 24  GLUCOSE 202* 141*  BUN 22 22  CREATININE 0.80 0.84  CALCIUM 9.0 8.6  MG -- --  PHOS -- --   Liver Function Tests: No results found for this basename: AST:2,ALT:2,ALKPHOS:2,BILITOT:2,PROT:2,ALBUMIN:2 in the last 72 hours No results found for this basename: LIPASE:2,AMYLASE:2 in the last 72 hours No results found for this basename: AMMONIA:2 in the last 72 hours CBC:  Basename 06/06/12 0440 06/04/12 1943  WBC -- 7.3  NEUTROABS -- --  HGB 10.8* 10.8*  HCT 31.8* 32.6*  MCV -- 100.6*  PLT -- 227   Cardiac Enzymes:  Basename 06/05/12 1329 06/05/12 0735 06/05/12 0147  CKTOTAL -- -- --  CKMB -- -- --  CKMBINDEX -- -- --  TROPONINI <0.30 <0.30 <0.30   BNP:  Basename 06/04/12 1943  PROBNP 25466.0*   D-Dimer: No results found for this basename: DDIMER:2 in the last 72 hours CBG: No results found for this  basename: GLUCAP:6 in the last 72 hours Hemoglobin A1C: No results found for this basename: HGBA1C in the last 72 hours Fasting Lipid Panel: No results found for this basename: CHOL,HDL,LDLCALC,TRIG,CHOLHDL,LDLDIRECT in the last 72 hours Thyroid Function Tests: No results found for this basename: TSH,T4TOTAL,FREET4,T3FREE,THYROIDAB in the last 72 hours Anemia Panel: No results found for this basename: VITAMINB12,FOLATE,FERRITIN,TIBC,IRON,RETICCTPCT in the last 72 hours Coagulation: No results found for this basename: LABPROT:2,INR:2 in the last 72 hours Urine Drug Screen: Drugs of Abuse  No results found for this basename: labopia, cocainscrnur, labbenz, amphetmu, thcu, labbarb    Alcohol Level: No results found for this basename: ETH:2 in the last 72 hours Urinalysis:  Basename 06/04/12 2130  COLORURINE STRAW*  LABSPEC 1.015  PHURINE 5.5  GLUCOSEU NEGATIVE  HGBUR TRACE*  BILIRUBINUR NEGATIVE  KETONESUR NEGATIVE  PROTEINUR NEGATIVE  UROBILINOGEN 0.2  NITRITE NEGATIVE  LEUKOCYTESUR NEGATIVE   Misc. Labs:  ABGS  Basename 06/05/12 0515  PHART 7.459*  PO2ART 57.7*  TCO2 20.9  HCO3 22.7   CULTURES Recent Results (from the past 240 hour(s))  URINE CULTURE     Status: Normal   Collection Time   06/04/12  9:30 PM      Component Value Range Status Comment   Specimen Description URINE, CATHETERIZED   Final    Special Requests NONE   Final    Culture  Setup Time 06/04/2012 23:20   Final    Colony Count NO GROWTH   Final  Culture NO GROWTH   Final    Report Status 06/06/2012 FINAL   Final   MRSA PCR SCREENING     Status: Normal   Collection Time   06/04/12 10:49 PM      Component Value Range Status Comment   MRSA by PCR NEGATIVE  NEGATIVE Final    Studies/Results: Dg Chest Port 1 View  06/04/2012  *RADIOLOGY REPORT*  Clinical Data: Cough.  Shortness of breath.  PORTABLE CHEST - 1 VIEW 06/04/2012 1958 hours:  Comparison: None.  Findings: Cardiac silhouette moderately  to markedly enlarged. Diffuse interstitial and airspace pulmonary edema.  Bilateral pleural effusions and associated passive atelectasis in the lower lobes.  IMPRESSION: CHF, with moderate diffuse interstitial and airspace pulmonary edema.  Bilateral pleural effusions with associated passive atelectasis in the lower lobes.   Original Report Authenticated By: Hulan Saas, M.D.     Medications:  Prior to Admission:  Prescriptions prior to admission  Medication Sig Dispense Refill  . ALPRAZolam (XANAX) 0.5 MG tablet Take 0.5 mg by mouth daily as needed. Nerves/anxiety      . aspirin 325 MG EC tablet Take 325 mg by mouth every morning.        Marland Kitchen atenolol (TENORMIN) 50 MG tablet Take 50 mg by mouth every morning.        . digoxin (LANOXIN) 0.25 MG tablet Take 250 mcg by mouth every morning.        . diltiazem (CARDIZEM CD) 180 MG 24 hr capsule Take 180 mg by mouth every morning.        . sertraline (ZOLOFT) 50 MG tablet Take 50 mg by mouth daily.      Marland Kitchen tiotropium (SPIRIVA) 18 MCG inhalation capsule Place 18 mcg into inhaler and inhale daily.       Scheduled:   . aspirin  325 mg Oral Daily  . atenolol  50 mg Oral Q breakfast  . digoxin  250 mcg Oral Daily  . enoxaparin (LOVENOX) injection  40 mg Subcutaneous Q24H  . furosemide  40 mg Intravenous Q12H  . influenza  inactive virus vaccine  0.5 mL Intramuscular Tomorrow-1000  . insulin aspart  0-15 Units Subcutaneous TID WC  . insulin aspart  0-5 Units Subcutaneous QHS  . insulin glargine  5 Units Subcutaneous QHS  . levalbuterol  0.63 mg Nebulization Q4H  . methylPREDNISolone (SOLU-MEDROL) injection  125 mg Intravenous Q6H  . [COMPLETED] metoprolol  3 mg Intravenous Once  . moxifloxacin  400 mg Intravenous Q24H  . pantoprazole  40 mg Oral Daily  . pneumococcal 23 valent vaccine  0.5 mL Intramuscular Tomorrow-1000  . potassium chloride  10 mEq Intravenous Q1 Hr x 4  . [COMPLETED] potassium chloride  40 mEq Oral Once  . potassium chloride   40 mEq Oral TID  . sertraline  50 mg Oral Daily  . sodium chloride  3 mL Intravenous Q12H  . tiotropium  18 mcg Inhalation Daily  . [COMPLETED] zolpidem  5 mg Oral Once  . [DISCONTINUED] potassium chloride  20 mEq Oral BID   Continuous:   . diltiazem (CARDIZEM) infusion 10 mg/hr (06/06/12 1610)   RUE:AVWUJW chloride, acetaminophen, acetaminophen, ALPRAZolam, HYDROcodone-acetaminophen, morphine injection, sodium chloride  Assesment: She seems to be breathing better. She has diuresed about 4-1/2 L. However her heart rate is still up. Her blood sugar is up. She is hypokalemic. My assessment is that she had COPD exacerbation which then caused her to have atrial fibrillation with rapid ventricular response which then  put her into overt CHF. She does have reduction of ejection fraction on echocardiogram Principal Problem:  *CHF (congestive heart failure) Active Problems:  Atrial fibrillation with rapid ventricular response  COPD exacerbation  Anxiety  Depression    Plan: She will start sliding scale insulin. She will have potassium replacement.    LOS: 2 days   Terez Freimark L 06/06/2012, 8:26 AM

## 2012-06-06 NOTE — Progress Notes (Signed)
eLink Physician-Brief Progress Note Patient Name: ALITHEA LAPAGE DOB: 01/16/1956 MRN: [id number]  Date of Service  06/06/2012   HPI/Events of Note   HR 120 - 140 a fib despite cardizem gtt, dig and atenolol po  eICU Interventions  Will give lopressor 3mg  IV x 1   Intervention Category Major Interventions: Arrhythmia - evaluation and management  Raysean Graumann 06/06/2012, 2:08 AM

## 2012-06-06 NOTE — Clinical Documentation Improvement (Signed)
Abnormal Labs Clarification  TEST QUERY  THIS DOCUMENT IS NOT A PERMANENT PART OF THE MEDICAL RECORD  TO RESPOND TO THE THIS QUERY, FOLLOW THE INSTRUCTIONS BELOW:  1. If needed, update documentation for the patient's encounter via the notes activity.  2. Access this query again and click edit on the [employer].  3. After updating, or not, click F2 to complete all highlighted (required) fields concerning your review. Select "additional documentation in the medical record" OR "no additional documentation provided".  4. Click Sign note button.  5. The deficiency will fall out of your InBasket *Please let us know if you are not able to complete this workflow by phone or e-mail (listed below).  Please update your documentation within the medical record to reflect your response to this query.                                                                                   06/06/12  Dear Dr. Dietrich Pates   In a better effort to capture your patient's severity of illness, reflect appropriate length of stay and utilization of resources, a review of the medical record has revealed the following indicators.    Based on your clinical judgment, please clarify and document in a progress note and/or discharge summary the clinical condition associated with the following supporting information:  In responding to this query please exercise your independent judgment.  The fact that a query is asked, does not imply that any particular answer is desired or expected.  Abnormal findings (laboratory, x-ray, pathologic, and other diagnostic results) are not coded and reported unless the physician indicates their clinical significance.   The medical record reflects the following clinical findings, please clarify the diagnostic and/or clinical significance:      Possible Clinical Conditions?                                 Supporting Information:  ____________________                                         Lab Test: ____________________  ____________________                                        Risk Factors: _________________  ____________________                                        Patient Values: ________________                       Normal Values :________________  Other Condition___________________                Treatment: ____________________  Cannot Clinically Determine_________   Reviewed:  no additional documentation provided   Thank You,  Amada Kingfisher  RN, BSN, CCM  Clinical Documentation  Specialist: [email or web address] [phone]  Health Information Management [place]

## 2012-06-06 NOTE — Care Management Note (Unsigned)
    Page 1 of 2   06/09/2012     2:59:05 PM   CARE MANAGEMENT NOTE 06/09/2012  Patient:  [name] [name], [name] [name]   Account Number:  [id number]  Date Initiated:  06/06/2012  Documentation initiated by:  [name] [name]  Subjective/Objective Assessment:   Pt up in chair, visiting with son , and her sister, in the the room. She states she lives at home alone, and is usually independent, and does well. She has multiple family member with whom she can spend a week or two if needed at D/C, prior     Action/Plan:   to returning home. Son lives in [place], but says she can go home with him if need be, but she works at [employer], so depends  on if MD takes her out of work, and for how long. She agrees to [facility] services if needed.   Anticipated DC Date:  06/09/2012   Anticipated DC Plan:  HOME W HOME HEALTH SERVICES      DC Planning Services  CM consult      [facility] Choice  HOME HEALTH   Choice offered to / List presented to:  C-1 Patient        HH arranged  HH-1 RN  HH-10 DISEASE MANAGEMENT      HH agency  [facility]   Status of service:  Completed, signed off Medicare Important Message given?   (If response is "NO", the following Medicare IM given date fields will be blank) Date Medicare IM given:   Date Additional Medicare IM given:    Discharge Disposition:    Per UR Regulation:  Reviewed for med. necessity/level of care/duration of stay  If discussed at Long Length of Stay Meetings, dates discussed:    Comments:  06/09/12 [name] Holms RN BSN CM. Spoke with pt. States she plans to DC to son's home for this weekend, [name] [name]. Monday, she will go to her sister's [name] [name]'s home. Pt does not remember their phone numbers or addresses and her Cell is out of power. Her cell phone # is [phone] and she will charge it upon DC.  11/6/131130 [name] Henderson RN Spoke with pt again today. Son and 2 sisters visiting in room. They have decided she will go home with one of the sisters for 2 weeks at D/C, but  have not decided which one yet. Both in [place]. Discussed HH and list offered.  No preference, except they want one covered by insurance. HH set up with [facility]. 06/06/12 1500 [name] Henderson RN Pt will probably go home on Coumadin for AFib, so will need PT INR draws.

## 2012-06-06 NOTE — Progress Notes (Signed)
eLink Physician-Brief Progress Note Patient Name: JAIELLE DLOUHY DOB: 09/23/1955 MRN: [id number]  Date of Service  06/06/2012   HPI/Events of Note    Lab 06/06/12 0440 06/05/12 0147 06/04/12 1943  K 2.9* 3.7 4.0    Lab 06/06/12 0440 06/05/12 0147 06/04/12 1943  CREATININE 0.80 0.84 0.90      eICU Interventions  Po kcl x 1 now mag and phos check and further k repletion to be decided by bedside MD   Intervention Category Intermediate Interventions: Other:  Johnwilliam Shepperson 06/06/2012, 6:50 AM

## 2012-06-06 NOTE — Progress Notes (Signed)
ANTICOAGULATION CONSULT NOTE - Initial Consult  Pharmacy Consult for Warfarin Indication: atrial fibrillation  No Known Allergies  Patient Measurements: Height: 5\' 5"  (165.1 cm) Weight: 123 lb 0.3 oz (55.8 kg) IBW/kg (Calculated) : 57   Vital Signs: Temp: 97.7 F (36.5 C) (11/05 0830) Temp src: Oral (11/05 0830) BP: 108/53 mmHg (11/05 1000) Pulse Rate: 66  (11/05 1000)  Labs:  Basename 06/06/12 0440 06/05/12 1329 06/05/12 0735 06/05/12 0147 06/04/12 1943  HGB 10.8* -- -- -- 10.8*  HCT 31.8* -- -- -- 32.6*  PLT -- -- -- -- 227  APTT -- -- -- -- --  LABPROT -- -- -- -- --  INR -- -- -- -- --  HEPARINUNFRC -- -- -- -- --  CREATININE 0.80 -- -- 0.84 0.90  CKTOTAL -- -- -- -- --  CKMB -- -- -- -- --  TROPONINI -- <0.30 <0.30 <0.30 --    Estimated Creatinine Clearance: 68.3 ml/min (by C-G formula based on Cr of 0.8).  Medical History: Past Medical History  Diagnosis Date  . Dysrhythmia   . COPD (chronic obstructive pulmonary disease)   . Depression    Medications:  Scheduled:    . aspirin  325 mg Oral Daily  . atenolol  50 mg Oral Q breakfast  . digoxin  250 mcg Oral Daily  . enoxaparin (LOVENOX) injection  40 mg Subcutaneous Q24H  . furosemide  40 mg Intravenous Q12H  . [COMPLETED] influenza  inactive virus vaccine  0.5 mL Intramuscular Tomorrow-1000  . insulin aspart  0-15 Units Subcutaneous TID WC  . insulin aspart  0-5 Units Subcutaneous QHS  . insulin glargine  5 Units Subcutaneous QHS  . levalbuterol  0.63 mg Nebulization Q4H  . methylPREDNISolone (SOLU-MEDROL) injection  125 mg Intravenous Q6H  . [COMPLETED] metoprolol  3 mg Intravenous Once  . moxifloxacin  400 mg Intravenous Q24H  . pantoprazole  40 mg Oral Daily  . patient's guide to using coumadin book   Does not apply Once  . [COMPLETED] pneumococcal 23 valent vaccine  0.5 mL Intramuscular Tomorrow-1000  . potassium chloride  10 mEq Intravenous Q1 Hr x 4  . [COMPLETED] potassium chloride  40 mEq  Oral Once  . potassium chloride  40 mEq Oral TID  . sertraline  50 mg Oral Daily  . sodium chloride  3 mL Intravenous Q12H  . tiotropium  18 mcg Inhalation Daily  . warfarin  5 mg Oral ONCE-1800  . warfarin   Does not apply Once  . Warfarin - Pharmacist Dosing Inpatient   Does not apply q1800  . [COMPLETED] zolpidem  5 mg Oral Once  . [DISCONTINUED] potassium chloride  20 mEq Oral BID    Assessment: 56yo female with AFib.  Per NP:  Atrial Fibrillation: She is currently rate controlled on digoxin and BB. CHADs Score of 2. Will need to consider anticoagulation strategy as left atrium is severely dilated and doubt she will convert. She will be started on coumadin per pharmacy.  INR pending for today but predict it will be "normal"     Goal of Therapy:  INR 2-3 Monitor platelets by anticoagulation protocol: Yes   Plan: Coumadin 5mg  today x 1 INR daily Initiate Coumadin education, book and video ordered  Valrie Hart A 06/06/2012,10:13 AM

## 2012-06-06 NOTE — Progress Notes (Signed)
Pt watched Coumadin video. Pt provided handouts on Coumadin along with Coumadin booklet. Education provided using teach back method.

## 2012-06-06 NOTE — Progress Notes (Signed)
Inpatient Diabetes Program Recommendations  AACE/ADA: New Consensus Statement on Inpatient Glycemic Control (2013)  Target Ranges:  Prepandial:   less than 140 mg/dL      Peak postprandial:   less than 180 mg/dL (1-2 hours)      Critically ill patients:  140 - 180 mg/dL   Results for CHINMAYI, MULLIS (MRN [id number]) as of 06/06/2012 07:24  Ref. Range 06/04/2012 19:43 06/05/2012 01:47 06/06/2012 04:40  Glucose Latest Range: 70-99 mg/dL 84 295 (H) 284 (H)    Inpatient Diabetes Program Recommendations Correction (SSI): Please consider starting on Novolog sensitive correction since patient is on IV steroids and fasting CBG this am is 202 mg/dl.  Note: Patient has no history of diabetes.  However since patient is receiving Solumedrol 125mg  IV QID her fasting blood glucose is elevated.  Will continue to follow. Thanks, Orlando Penner, RN, BSN, CCRN Diabetes Coordinator Inpatient Diabetes Program [phone]

## 2012-06-06 NOTE — Progress Notes (Addendum)
SUBJECTIVE:Feeling better, breathing is easier. No complaints of pain.  Principal Problem:  *CHF (congestive heart failure) Active Problems:  Atrial fibrillation with rapid ventricular response  COPD exacerbation  Anxiety  Depression  Hypokalemia  Diabetes mellitus due to underlying condition  Total I&O: -3.8 L; however, Weight has increased 4 kg since admission  LABS: Basic Metabolic Panel:  Basename 06/06/12 0440 06/05/12 0147  NA 140 137  K 2.9* 3.7  CL 101 101  CO2 27 24  GLUCOSE 202* 141*  BUN 22 22  CREATININE 0.80 0.84  CALCIUM 9.0 8.6  MG -- --  PHOS -- --   CBC:  Basename 06/06/12 0440 06/04/12 1943  WBC -- 7.3  NEUTROABS -- --  HGB 10.8* 10.8*  HCT 31.8* 32.6*  MCV -- 100.6*  PLT -- 227   Cardiac Enzymes:  Basename 06/05/12 1329 06/05/12 0735 06/05/12 0147  CKTOTAL -- -- --  CKMB -- -- --  CKMBINDEX -- -- --  TROPONINI <0.30 <0.30 <0.30   Echocardiogram 06/05/2012 Left ventricle: LVEF is depressed at approximately 40%. The cavity size was mildly dilated. Wall thickness was normal. - Aortic valve: Mild regurgitation. - Mitral valve: Mild to moderate regurgitation. - Left atrium: The atrium was severely dilated. - Right ventricle: Systolic function was mildly reduced. - Right atrium: The atrium was mildly dilated. - Pulmonary arteries: PA peak pressure: 48mm Hg (S). - Pericardium, extracardiac: A trivial pericardial effusion was identified. There was a left pleural effusion.  RADIOLOGY: Dg Chest Port 1 View  06/04/2012  *RADIOLOGY REPORT*  Clinical Data: Cough.  Shortness of breath.  PORTABLE CHEST - 1 VIEW 06/04/2012 1958 hours:  Comparison: None.  Findings: Cardiac silhouette moderately to markedly enlarged. Diffuse interstitial and airspace pulmonary edema.  Bilateral pleural effusions and associated passive atelectasis in the lower lobes.  IMPRESSION: CHF, with moderate diffuse interstitial and airspace pulmonary edema.  Bilateral pleural  effusions with associated passive atelectasis in the lower lobes.   Original Report Authenticated By: Hulan Saas, M.D.    PHYSICAL EXAM BP 102/73  Pulse 84  Temp 97.7 F (36.5 C) (Oral)  Resp 21  Ht 5\' 5"  (1.651 m)  Wt 123 lb 0.3 oz (55.8 kg)  BMI 20.47 kg/m2  SpO2 98% General: Well developed, well nourished, in no acute distress Head: Eyes PERRLA, No xanthomas.   Normal cephalic and atramatic  Lungs: Clear bilaterally to auscultation and percussion. Heart: Irregular HR, No MRG .  Pulses are 2+ & equal.            No carotid bruit. No JVD.  No abdominal bruits. No femoral bruits. Abdomen: Bowel sounds are positive, abdomen soft and non-tender without masses or                  Hernia's noted. Msk:  Back normal, normal gait. Normal strength and tone for age. Extremities: No clubbing, cyanosis or edema.  DP +1 Neuro: Alert and oriented X 3. Psych:  Good affect, responds appropriately  TELEMETRY: Reviewed telemetry pt in Atrial Fib rates int he 70-80's.:  ASSESSMENT AND PLAN:  1. Acute Systolic CHF: She continues to diurese approx 2000 cc daily. Wts are not being completed. She is breathing much better. EF per echo 40%. She will need to have continued diureses with OP management and close follow up on discharge. May need to consider changing atenolol to coreg for better management, once she is euvolemic.  2. Atrial Fibrillation: She is currently rate controlled on digoxin and BB. CHADs  Score of 2.  She will be started on coumadin per pharmacy.   3. Hypokalemia: Despite PO replacement she was at 2.9 on am labs. She has been given IV replacement this am with follow up labs. Potassium has been increased to 40 mEq TID at this time.  Bettey Mare. Lyman Bishop NP [facility] 06/06/2012, 9:33 AM  Cardiology Attending Patient interviewed and examined. Discussed with Joni Reining, NP.  Above note annotated and modified based upon my findings.  Unable to locate records of prior  office visits or cardiology-related hospitalizations. [facility] records will be searched tomorrow. EKG in 08/2011 reveals AF with controlled ventricular rate and RBBB.  For now, we will control heart rate and continue diuresis.  Examination and chest x-ray indicates significant congestive heart failure-BNP level will be obtained to verify that impression.  TSH level will be obtained.  Heart rate control currently sought with 3 AV nodal blocking agents. We will plan to ultimately utilized 2 if these provide adequate control of heart rate.  Cisco Bing, MD 06/06/2012, 6:54 PM

## 2012-06-07 ENCOUNTER — Telehealth (HOSPITAL_COMMUNITY): Payer: Self-pay | Admitting: Dietician

## 2012-06-07 ENCOUNTER — Inpatient Hospital Stay (HOSPITAL_COMMUNITY): Payer: BC Managed Care – PPO

## 2012-06-07 LAB — CBC WITH DIFFERENTIAL/PLATELET
Basophils Relative: 0 % (ref 0–1)
Eosinophils Absolute: 0 10*3/uL (ref 0.0–0.7)
Hemoglobin: 10.6 g/dL — ABNORMAL LOW (ref 12.0–15.0)
MCH: 32.9 pg (ref 26.0–34.0)
MCHC: 32.7 g/dL (ref 30.0–36.0)
Monocytes Relative: 3 % (ref 3–12)
Neutro Abs: 11.5 10*3/uL — ABNORMAL HIGH (ref 1.7–7.7)
Neutrophils Relative %: 95 % — ABNORMAL HIGH (ref 43–77)
Platelets: 203 10*3/uL (ref 150–400)
RBC: 3.22 MIL/uL — ABNORMAL LOW (ref 3.87–5.11)

## 2012-06-07 LAB — BASIC METABOLIC PANEL
Calcium: 9 mg/dL (ref 8.4–10.5)
GFR calc Af Amer: 84 mL/min — ABNORMAL LOW (ref >90)
GFR calc non Af Amer: 73 mL/min — ABNORMAL LOW (ref >90)
Potassium: 3.9 mEq/L (ref 3.5–5.1)
Sodium: 139 mEq/L (ref 135–145)

## 2012-06-07 LAB — GLUCOSE, CAPILLARY
Glucose-Capillary: 147 mg/dL — ABNORMAL HIGH (ref 70–99)
Glucose-Capillary: 220 mg/dL — ABNORMAL HIGH (ref 70–99)

## 2012-06-07 LAB — PROTIME-INR
INR: 1.23 (ref 0.00–1.49)
Prothrombin Time: 15.3 seconds — ABNORMAL HIGH (ref 11.6–15.2)

## 2012-06-07 LAB — FOLATE: Folate: >24 ng/mL (ref >5.4)

## 2012-06-07 MED ORDER — MOXIFLOXACIN HCL 400 MG PO TABS
400.0000 mg | ORAL_TABLET | Freq: Every day | ORAL | Status: DC
  Administered 2012-06-07 – 2012-06-08 (×2): 400 mg via ORAL
  Filled 2012-06-07 (×2): qty 1

## 2012-06-07 MED ORDER — METHYLPREDNISOLONE SODIUM SUCC 40 MG IJ SOLR
40.0000 mg | Freq: Two times a day (BID) | INTRAMUSCULAR | Status: DC
  Administered 2012-06-07 – 2012-06-09 (×3): 40 mg via INTRAVENOUS
  Filled 2012-06-07 (×4): qty 1

## 2012-06-07 MED ORDER — LIVING WELL WITH DIABETES BOOK
Freq: Once | Status: AC
  Administered 2012-06-07: 13:00:00
  Filled 2012-06-07: qty 1

## 2012-06-07 MED ORDER — WARFARIN SODIUM 5 MG PO TABS
5.0000 mg | ORAL_TABLET | Freq: Once | ORAL | Status: AC
  Administered 2012-06-07: 5 mg via ORAL
  Filled 2012-06-07: qty 1

## 2012-06-07 MED ORDER — ATENOLOL 25 MG PO TABS
25.0000 mg | ORAL_TABLET | Freq: Three times a day (TID) | ORAL | Status: DC
  Administered 2012-06-07 – 2012-06-09 (×6): 25 mg via ORAL
  Filled 2012-06-07 (×6): qty 1

## 2012-06-07 MED ORDER — DILTIAZEM HCL 60 MG PO TABS
90.0000 mg | ORAL_TABLET | Freq: Four times a day (QID) | ORAL | Status: DC
  Administered 2012-06-07 – 2012-06-09 (×9): 90 mg via ORAL
  Filled 2012-06-07 (×9): qty 1

## 2012-06-07 NOTE — Evaluation (Signed)
Physical Therapy Evaluation Patient Details Name: [name] [name] MRN: [id number] DOB: 1955/08/22 Today's Date: 06/07/2012 Time: 1310-1350 PT Time Calculation (min): 40 min  PT Assessment / Plan / Recommendation Clinical Impression  Pt who has decreaed activity tolerance who will benefit from skilled PT to improve safety of ambulation as well as stairclimbing.      PT Assessment  Patient needs continued PT services    Follow Up Recommendations  No PT follow up    Does the patient have the potential to tolerate intense rehabilitation    N/A           Frequency Min 3X/week    Precautions / Restrictions Precautions Precautions: None Restrictions Weight Bearing Restrictions: No         Mobility  Bed Mobility Bed Mobility: Supine to Sit Supine to Sit: 6: Modified independent (Device/Increase time) Transfers Transfers: Sit to Stand Sit to Stand: 6: Modified independent (Device/Increase time) Ambulation/Gait Ambulation/Gait Assistance: 6: Modified independent (Device/Increase time) Ambulation Distance (Feet): 400 Feet Assistive device: None Gait Pattern: Decreased step length - right;Decreased step length - left Gait velocity: slow General Gait Details: gt with 2 L O2 Stairs: No         Exercises General Exercises - Lower Extremity Gluteal Sets:  (bridging x 10) Straight Leg Raises: AROM;10 reps   PT Diagnosis: Generalized weakness  PT Problem List: Decreased strength;Decreased activity tolerance;Decreased balance PT Treatment Interventions: Gait training;Stair training   PT Goals Acute Rehab PT Goals PT Goal Formulation: With patient Time For Goal Achievement: 06/09/12 Potential to Achieve Goals: Good Pt will go Supine/Side to Sit: Independently PT Goal: Supine/Side to Sit - Progress: Goal set today Pt will Ambulate: >150 feet;Independently PT Goal: Ambulate - Progress: Goal set today Pt will Go Up / Down Stairs: 3-5 stairs;with modified independence PT  Goal: Up/Down Stairs - Progress: Goal set today  Visit Information  Last PT Received On: 06/07/12    Subjective Data  Subjective: Pt states she was nervous earlier but feels better now Patient Stated Goal: to go home   Prior Functioning  Home Living Lives With: Alone Available Help at Discharge: Family;Friend(s) Type of Home: Mobile home Home Access: Stairs to enter Entrance Stairs-Rails: Right Home Layout: One level Bathroom Shower/Tub: Engineer, manufacturing systems: Standard Home Adaptive Equipment: None Prior Function Level of Independence: Independent Able to Take Stairs?: Reciprically Driving: Yes Vocation: Full time employment Comments: Conservator, museum/gallery Communication: No difficulties Dominant Hand: Right    Cognition  Overall Cognitive Status: Appears within functional limits for tasks assessed/performed Arousal/Alertness: Awake/alert Orientation Level: Appears intact for tasks assessed Behavior During Session: [facility] for tasks performed    Extremity/Trunk Assessment Left Upper Extremity Assessment LUE ROM/Strength/Tone: Within functional levels Right Lower Extremity Assessment RLE ROM/Strength/Tone: Within functional levels   Balance    End of Session PT - End of Session Equipment Utilized During Treatment: Gait belt Activity Tolerance: Patient tolerated treatment well Patient left: in chair;with call bell/phone within reach;with family/visitor present Nurse Communication: Mobility status  GP     RUSSELL,CINDY 06/07/2012, 2:01 PM

## 2012-06-07 NOTE — Progress Notes (Signed)
Subjective: She says she feels better. She has no new complaints. Her heart rate is doing a little bit better. Her oxygenation has been okay.  Objective: Vital signs in last 24 hours: Temp:  [97.5 F (36.4 C)-98.7 F (37.1 C)] 97.7 F (36.5 C) (11/06 0400) Pulse Rate:  [47-107] 56  (11/06 0600) Resp:  [14-27] 14  (11/06 0600) BP: (71-128)/(49-92) 107/55 mmHg (11/06 0600) SpO2:  [92 %-100 %] 99 % (11/06 0713) Weight:  [56.7 kg (125 lb)-57.6 kg (126 lb 15.8 oz)] 57.6 kg (126 lb 15.8 oz) (11/06 0500) Weight change:  Last BM Date: 06/06/12  Intake/Output from previous day: 11/05 0701 - 11/06 0700 In: 3130.3 [P.O.:1860; I.V.:366.3; IV Piggyback:904] Out: 3950 [Urine:3950]  PHYSICAL EXAM General appearance: alert, cooperative and mild distress Resp: rhonchi bilaterally Cardio: irregularly irregular rhythm GI: soft, non-tender; bowel sounds normal; no masses,  no organomegaly Extremities: extremities normal, atraumatic, no cyanosis or edema  Lab Results:    Basic Metabolic Panel:  Basename 06/07/12 0442 06/06/12 1722  NA 139 133*  K 3.9 4.4  CL 103 97  CO2 25 22  GLUCOSE 168* 230*  BUN 27* 26*  CREATININE 0.87 0.89  CALCIUM 9.0 9.1  MG -- --  PHOS -- --   Liver Function Tests: No results found for this basename: AST:2,ALT:2,ALKPHOS:2,BILITOT:2,PROT:2,ALBUMIN:2 in the last 72 hours No results found for this basename: LIPASE:2,AMYLASE:2 in the last 72 hours No results found for this basename: AMMONIA:2 in the last 72 hours CBC:  Basename 06/07/12 0442 06/06/12 0440 06/04/12 1943  WBC 12.1* -- 7.3  NEUTROABS 11.5* -- --  HGB 10.6* 10.8* --  HCT 32.4* 31.8* --  MCV 100.6* -- 100.6*  PLT 203 -- 227   Cardiac Enzymes:  Basename 06/05/12 1329 06/05/12 0735 06/05/12 0147  CKTOTAL -- -- --  CKMB -- -- --  CKMBINDEX -- -- --  TROPONINI <0.30 <0.30 <0.30   BNP:  Basename 06/06/12 1722 06/04/12 1943  PROBNP 7326.0* 25466.0*   D-Dimer: No results found for this  basename: DDIMER:2 in the last 72 hours CBG:  Basename 06/07/12 0717 06/06/12 2114 06/06/12 1630 06/06/12 1124  GLUCAP 147* 173* 163* 227*   Hemoglobin A1C:  Basename 06/04/12 0830  HGBA1C 5.6   Fasting Lipid Panel: No results found for this basename: CHOL,HDL,LDLCALC,TRIG,CHOLHDL,LDLDIRECT in the last 72 hours Thyroid Function Tests: No results found for this basename: TSH,T4TOTAL,FREET4,T3FREE,THYROIDAB in the last 72 hours Anemia Panel:  Basename 06/04/12 0830  VITAMINB12 1424*  FOLATE --  FERRITIN 186  TIBC 383  IRON 45  RETICCTPCT 6.0*   Coagulation:  Basename 06/07/12 0442 06/06/12 1113  LABPROT 15.3* 16.2*  INR 1.23 1.33   Urine Drug Screen: Drugs of Abuse  No results found for this basename: labopia, cocainscrnur, labbenz, amphetmu, thcu, labbarb    Alcohol Level: No results found for this basename: ETH:2 in the last 72 hours Urinalysis:  Basename 06/04/12 2130  COLORURINE STRAW*  LABSPEC 1.015  PHURINE 5.5  GLUCOSEU NEGATIVE  HGBUR TRACE*  BILIRUBINUR NEGATIVE  KETONESUR NEGATIVE  PROTEINUR NEGATIVE  UROBILINOGEN 0.2  NITRITE NEGATIVE  LEUKOCYTESUR NEGATIVE   Misc. Labs:  ABGS  Basename 06/05/12 0515  PHART 7.459*  PO2ART 57.7*  TCO2 20.9  HCO3 22.7   CULTURES Recent Results (from the past 240 hour(s))  URINE CULTURE     Status: Normal   Collection Time   06/04/12  9:30 PM      Component Value Range Status Comment   Specimen Description URINE, CATHETERIZED  Final    Special Requests NONE   Final    Culture  Setup Time 06/04/2012 23:20   Final    Colony Count NO GROWTH   Final    Culture NO GROWTH   Final    Report Status 06/06/2012 FINAL   Final   MRSA PCR SCREENING     Status: Normal   Collection Time   06/04/12 10:49 PM      Component Value Range Status Comment   MRSA by PCR NEGATIVE  NEGATIVE Final    Studies/Results: No results found.  Medications:  Prior to Admission:  Prescriptions prior to admission  Medication Sig  Dispense Refill  . ALPRAZolam (XANAX) 0.5 MG tablet Take 0.5 mg by mouth daily as needed. Nerves/anxiety      . aspirin 325 MG EC tablet Take 325 mg by mouth every morning.        Marland Kitchen atenolol (TENORMIN) 50 MG tablet Take 50 mg by mouth every morning.        . digoxin (LANOXIN) 0.25 MG tablet Take 250 mcg by mouth every morning.        . diltiazem (CARDIZEM CD) 180 MG 24 hr capsule Take 180 mg by mouth every morning.        . sertraline (ZOLOFT) 50 MG tablet Take 50 mg by mouth daily.      Marland Kitchen tiotropium (SPIRIVA) 18 MCG inhalation capsule Place 18 mcg into inhaler and inhale daily.       Scheduled:   . aspirin  325 mg Oral Daily  . atenolol  50 mg Oral Q breakfast  . digoxin  250 mcg Oral Daily  . enoxaparin (LOVENOX) injection  40 mg Subcutaneous Q24H  . furosemide  40 mg Intravenous Q12H  . [COMPLETED] influenza  inactive virus vaccine  0.5 mL Intramuscular Tomorrow-1000  . insulin aspart  0-15 Units Subcutaneous TID WC  . insulin aspart  0-5 Units Subcutaneous QHS  . insulin glargine  5 Units Subcutaneous QHS  . levalbuterol  0.63 mg Nebulization Q4H  . methylPREDNISolone (SOLU-MEDROL) injection  125 mg Intravenous Q6H  . moxifloxacin  400 mg Intravenous Q24H  . pantoprazole  40 mg Oral Daily  . [COMPLETED] patient's guide to using coumadin book   Does not apply Once  . [COMPLETED] pneumococcal 23 valent vaccine  0.5 mL Intramuscular Tomorrow-1000  . [COMPLETED] potassium chloride  10 mEq Intravenous Q1 Hr x 4  . potassium chloride  40 mEq Oral TID  . sertraline  50 mg Oral Daily  . sodium chloride  3 mL Intravenous Q12H  . tiotropium  18 mcg Inhalation Daily  . [COMPLETED] warfarin  5 mg Oral ONCE-1800  . [COMPLETED] warfarin   Does not apply Once  . Warfarin - Pharmacist Dosing Inpatient   Does not apply q1800  . [DISCONTINUED] potassium chloride  20 mEq Oral BID   Continuous:   . diltiazem (CARDIZEM) infusion 10 mg/hr (06/07/12 0600)   ZOX:WRUEAV chloride, acetaminophen,  acetaminophen, ALPRAZolam, HYDROcodone-acetaminophen, morphine injection, simethicone, sodium chloride  Assesment: She was admitted with CHF. Her BNP on admission was 25,000. She has atrial fibrillation with rapid ventricular response which is a new problem as is the CHF. She has severe COPD. She has anxiety and depression. She has diabetes and she is on insulin now. Overall I think she's better and my plan is to keep her in the intensive care unit today reduce her steroids try to do more diabetic teaching or teaching regarding Coumadin and get  her up and moving some more. She will probably be able to be transferred to the floor tomorrow and perhaps discharged the next day. She will need extensive home health services and she's going to live with a family member. Principal Problem:  *CHF (congestive heart failure) Active Problems:  Atrial fibrillation with rapid ventricular response  COPD exacerbation  Anxiety  Depression  Hypokalemia  Diabetes mellitus due to underlying condition  Anemia    Plan: As above    LOS: 3 days   [name] [name] L 06/07/2012, 8:05 AM

## 2012-06-07 NOTE — Progress Notes (Signed)
Subjective:  Very nervous and anxious this am. She says it's not unusual. Cardizem drip increased to 15mg /hr because of increase heart rate.  Objective:  Vital Signs in the last 24 hours: Temp:  [97.5 F (36.4 C)-98.7 F (37.1 C)] 97.7 F (36.5 C) (11/06 0400) Pulse Rate:  [47-107] 56  (11/06 0600) Resp:  [14-27] 14  (11/06 0600) BP: (71-128)/(49-92) 107/55 mmHg (11/06 0600) SpO2:  [92 %-100 %] 99 % (11/06 0713) Weight:  [125 lb (56.7 kg)-126 lb 15.8 oz (57.6 kg)] 126 lb 15.8 oz (57.6 kg) (11/06 0500)  Intake/Output from previous day: 11/05 0701 - 11/06 0700 In: 3130.3 [P.O.:1860; I.V.:366.3; IV Piggyback:904] Out: 3950 [Urine:3950] Total Intake/Output since admission: -5.6 L   Physical Exam: NECK: Without JVD, HJR, or bruit LUNGS: Decreased breath sounds; few basilar rales HEART:Irregular rate and rhythm, no murmur, gallop, rub, bruit, thrill, or heave EXTREMITIES: Without cyanosis, clubbing, or edema  Basename 06/07/12 0442 06/06/12 0440 06/04/12 1943  WBC 12.1* -- 7.3  HGB 10.6* 10.8* --  PLT 203 -- 227    Basename 06/07/12 0442 06/06/12 1722  NA 139 133*  K 3.9 4.4  CL 103 97  CO2 25 22  GLUCOSE 168* 230*  BUN 27* 26*  CREATININE 0.87 0.89   Imaging: Echocardiogram 06/05/2012 LVEF is depressed at approximately 40%.  No LVH. Mildly dilated. - Aortic valve: Mild regurgitation. - Severe left and mild right atrial dilatation.  Mild to moderate mitral regurgitation.  Mildly impaired RV function. - mild-to-moderate elevation in right-sided pressure; small pericardial plus left pleural effusions.  He  Assessment/Plan:  1. Acute Systolic CHF: She continues to diurese with substantial net loss of fluid since admission.  She is breathing much better. EF per echo 40%. BNP 7326.. F/u cxr  2. Atrial Fibrillation: Heart rate . Heart rate 47-135 on Digoxin.25mg , atenolol 50mg  qd and IV Cardizem.CHADs Score of 2.  On Lovenox and Coumadin. HR up this am and IV Cardizem increased  to 15mg /hr. TSH pending.  3. Hypokalemia:  potassium normal today.  4. Anxiety: xanax seems to help.  4.COPD exacerbation.   LOS: 3 days  Jacolyn Reedy 06/07/2012, 8:14 AM   Cardiology Attending Patient interviewed and examined. Discussed with Jacolyn Reedy, PA-C.  Above note annotated and modified based upon my findings.  Diltiazem will be changed to to an oral formulation at 90 mg 4 times per day and dose of beta blocker increased, if blood pressure permits.  Systolics have been greater than 110 mmHg today except for a value of 70 at 4 AM.  If heart rate cannot be controlled with these 3 medications, it will be necessary to use amiodarone.  Anticoagulation in progress.  CHF improved-continue to diurese with intravenous furosemide.  Add ACE inhibitor if blood pressure permits after heart rate is controlled.  [place] Bing, MD 06/07/2012, 6:15 PM

## 2012-06-07 NOTE — Progress Notes (Signed)
Pt educated on how to draw up insulin and how to give insulin using the teach back method and return demonstration. Pt able to verbalize and repeat back steps however still needs reinforcement. Will continue education.

## 2012-06-07 NOTE — Progress Notes (Signed)
Pt educated on how to check blood sugar using teach back and demonstration. Pt needscontinued encouragement and reinforcement. Family at bedside and also shown. Will continue to monitor.

## 2012-06-07 NOTE — Telephone Encounter (Signed)
Received referral via voicemail from Orlando Penner, Diabetes Coordinator for outpatient referral for diabetes education. Pt with new onset diabetes.

## 2012-06-07 NOTE — Progress Notes (Signed)
ANTICOAGULATION CONSULT NOTE - Initial Consult  Pharmacy Consult for Warfarin Indication: atrial fibrillation  No Known Allergies  Patient Measurements: Height: 5\' 5"  (165.1 cm) Weight: 126 lb 15.8 oz (57.6 kg) IBW/kg (Calculated) : 57   Vital Signs: Temp: 97.7 F (36.5 C) (11/06 0400) Temp src: Oral (11/06 0400) BP: 113/49 mmHg (11/06 0700) Pulse Rate: 63  (11/06 0800)  Labs:  Basename 06/07/12 0442 06/06/12 1722 06/06/12 1113 06/06/12 0440 06/05/12 1329 06/05/12 0735 06/05/12 0147 06/04/12 1943  HGB 10.6* -- -- 10.8* -- -- -- --  HCT 32.4* -- -- 31.8* -- -- -- 32.6*  PLT 203 -- -- -- -- -- -- 227  APTT -- -- -- -- -- -- -- --  LABPROT 15.3* -- 16.2* -- -- -- -- --  INR 1.23 -- 1.33 -- -- -- -- --  HEPARINUNFRC -- -- -- -- -- -- -- --  CREATININE 0.87 0.89 -- 0.80 -- -- -- --  CKTOTAL -- -- -- -- -- -- -- --  CKMB -- -- -- -- -- -- -- --  TROPONINI -- -- -- -- <0.30 <0.30 <0.30 --    Estimated Creatinine Clearance: 64.2 ml/min (by C-G formula based on Cr of 0.87).  Medical History: Past Medical History  Diagnosis Date  . Dysrhythmia   . COPD (chronic obstructive pulmonary disease)   . Depression    Medications:  Scheduled:     . aspirin  325 mg Oral Daily  . atenolol  50 mg Oral Q breakfast  . digoxin  250 mcg Oral Daily  . enoxaparin (LOVENOX) injection  40 mg Subcutaneous Q24H  . furosemide  40 mg Intravenous Q12H  . [COMPLETED] influenza  inactive virus vaccine  0.5 mL Intramuscular Tomorrow-1000  . insulin aspart  0-15 Units Subcutaneous TID WC  . insulin aspart  0-5 Units Subcutaneous QHS  . insulin glargine  5 Units Subcutaneous QHS  . levalbuterol  0.63 mg Nebulization Q4H  . methylPREDNISolone (SOLU-MEDROL) injection  40 mg Intravenous Q12H  . moxifloxacin  400 mg Intravenous Q24H  . pantoprazole  40 mg Oral Daily  . [COMPLETED] patient's guide to using coumadin book   Does not apply Once  . [COMPLETED] pneumococcal 23 valent vaccine  0.5 mL  Intramuscular Tomorrow-1000  . [COMPLETED] potassium chloride  10 mEq Intravenous Q1 Hr x 4  . potassium chloride  40 mEq Oral TID  . sertraline  50 mg Oral Daily  . sodium chloride  3 mL Intravenous Q12H  . tiotropium  18 mcg Inhalation Daily  . [COMPLETED] warfarin  5 mg Oral ONCE-1800  . [COMPLETED] warfarin   Does not apply Once  . Warfarin - Pharmacist Dosing Inpatient   Does not apply q1800  . [DISCONTINUED] methylPREDNISolone (SOLU-MEDROL) injection  125 mg Intravenous Q6H    Assessment: 56yo female started on Coumadin for new onset Afib.  Overlapping with Lovenox 40mg  daily until INR>2. INR rising to goal. No bleeding noted.   Goal of Therapy:  INR 2-3 Monitor platelets by anticoagulation protocol: Yes   Plan: Coumadin 5mg  today x 1 INR daily  Sheanna Dail, [facility] 06/07/2012,8:47 AM  PHARMACIST - PHYSICIAN COMMUNICATION DR:   Juanetta Gosling CONCERNING: Antibiotic IV to Oral Route Change Policy  RECOMMENDATION: This patient is receiving Avelox by the intravenous route.  Based on criteria approved by the Pharmacy and Therapeutics Committee, the antibiotic(s) is/are being converted to the equivalent oral dose form(s).   DESCRIPTION: These criteria include:  Patient being treated for a respiratory  tract infection, urinary tract infection, or cellulitis  The patient is not neutropenic and does not exhibit a GI malabsorption state  The patient is eating (either orally or via tube) and/or has been taking other orally administered medications for a least 24 hours  The patient is improving clinically and has a Tmax < 100.5  If you have questions about this conversion, please contact the Pharmacy Department  [x]   [phone] )  [facility] []   [phone] )  [facility]  []   [phone] )  [facility] []   [phone] )  [facility]    Junita Push, PharmD, BCPS 06/07/2012@9 :04 AM

## 2012-06-07 NOTE — Progress Notes (Signed)
Inpatient Diabetes Program Recommendations  AACE/ADA: New Consensus Statement on Inpatient Glycemic Control (2013)  Target Ranges:  Prepandial:   less than 140 mg/dL      Peak postprandial:   less than 180 mg/dL (1-2 hours)      Critically ill patients:  140 - 180 mg/dL   Reason for Visit: Diabetes Education  Note: Spoke with pt about diabetes. Discussed A1C results with her and explained what an A1C is, basic pathophysiology of DM, meal planning and carbohydrate counting, basic home care, importance of checking CBGs and maintaining good CBG control to prevent long-term and short-term complications. Reviewed signs and symptoms of hyperglycemia and hypoglycemia. Have reviewed Living Well with Diabetes educational booklet with the patient.  Patient is able to teach back key information to me but will need reinforcement with education.  Showed patient which diabetes educational videos she needs to watch and have asked that she watch them all. She states that she will watch them when her sister and her son come by this evening so that they all can watch them together.  Also talked to her about planning to come to outpatient diabetes education class here at [facility] which is free of charge.  Provided her with the contact information for Chaplin Human, RD and patient states that she will make arrangements to come to one of the classes. RNs to provide ongoing basic DM education at bedside with this patient.   Will continue to follow.  Thank you for the referral.  Orlando Penner, RN, BSN, CCRN Diabetes Coordinator Inpatient Diabetes Program [phone]

## 2012-06-08 LAB — GLUCOSE, CAPILLARY
Glucose-Capillary: 126 mg/dL — ABNORMAL HIGH (ref 70–99)
Glucose-Capillary: 134 mg/dL — ABNORMAL HIGH (ref 70–99)
Glucose-Capillary: 168 mg/dL — ABNORMAL HIGH (ref 70–99)

## 2012-06-08 LAB — PROTIME-INR
INR: 1.22 (ref 0.00–1.49)
Prothrombin Time: 15.2 seconds (ref 11.6–15.2)

## 2012-06-08 MED ORDER — WARFARIN SODIUM 7.5 MG PO TABS
7.5000 mg | ORAL_TABLET | Freq: Once | ORAL | Status: AC
  Administered 2012-06-08: 7.5 mg via ORAL
  Filled 2012-06-08: qty 1

## 2012-06-08 NOTE — Progress Notes (Signed)
She says she feels much better. She has no new complaints. Her heart rate has been better controlled.  She is awake and alert. Her heart rate is now in the 90s. Her blood pressure about 110-120 systolic. She looks more comfortable. Her chest is much clearer. Her heart is still irregular but heart rate is better controlled. Her abdomen is soft without masses. She does not have any peripheral edema. Her chest x-ray done yesterday looks much better and has cleared the CHF. Her blood sugar is still up some.  She was admitted with CHF rapid atrial fibrillation COPD exacerbation and diabetes. All of these are improving. She's ready for transfer out of the intensive care unit.  She will transfer from the intensive care unit. She may be able to go home tomorrow she will continue current treatments

## 2012-06-08 NOTE — Progress Notes (Signed)
PT BEING TRANSFERRED TO ROOM 317. PT IS ALERT AND ORIENTED. HR IN THE 60'S IN ATRIAL FIBRILATION. O2 SAT IS 100% ON ROOM AIR.IV NSL PATENT. DENIES ANY SOB OR DISCOMFORT. SON REMAINS AT BEDSIDE. TRANSFER REPORT CALLED TO CINDY ON 300. PT TRANSFERRED VIA W/C

## 2012-06-08 NOTE — Progress Notes (Signed)
UR Chart Review Completed  

## 2012-06-08 NOTE — Progress Notes (Addendum)
SUBJECTIVE: " I am breathing better." No complaints of palpitations, or chest discomfort.  Principal Problem:  *CHF (congestive heart failure) Active Problems:  Atrial fibrillation with rapid ventricular response  COPD exacerbation  Anxiety  Depression  Hypokalemia  Diabetes mellitus due to underlying condition  Anemia   LABS: Basic Metabolic Panel:  Basename 06/07/12 0442 06/06/12 1722  NA 139 133*  K 3.9 4.4  CL 103 97  CO2 25 22  GLUCOSE 168* 230*  BUN 27* 26*  CREATININE 0.87 0.89  CALCIUM 9.0 9.1  MG -- --  PHOS -- --   CBC:  Basename 06/07/12 0442 06/06/12 0440  WBC 12.1* --  NEUTROABS 11.5* --  HGB 10.6* 10.8*  HCT 32.4* 31.8*  MCV 100.6* --  PLT 203 --   Cardiac Enzymes:  Basename 06/05/12 1329  CKTOTAL --  CKMB --  CKMBINDEX --  TROPONINI <0.30   Echocardiogram: 06/05/2012 Left ventricle: LVEF is depressed at approximately 40%. The cavity size was mildly dilated. Wall thickness was normal. - Aortic valve: Mild regurgitation. - Mitral valve: Mild to moderate regurgitation. - Left atrium: The atrium was severely dilated. - Right ventricle: Systolic function was mildly reduced. - Right atrium: The atrium was mildly dilated. - Pulmonary arteries: PA peak pressure: 48mm Hg (S). - Pericardium, extracardiac: A trivial pericardial effusion was identified. There was a left pleural effusion.    Thyroid Function Tests:  Basename 06/07/12 0442  TSH 0.153*  T4TOTAL --  T3FREE --  [name] [name] --    RADIOLOGY: Dg Chest Port 1 View  06/07/2012  *RADIOLOGY REPORT*  Clinical Data: CHF  PORTABLE CHEST - 1 VIEW  Comparison: Portable exam 1537 hours compared to 06/04/2012  Findings: Marked enlargement of cardiac silhouette. Decreased pulmonary vascular congestion. Resolution of pulmonary edema seen on previous exam. Tiny residual left pleural effusion. Lungs otherwise clear. No pneumothorax. Bones demineralized.  IMPRESSION: Resolved CHF.   Original Report  Authenticated By: Ulyses Southward, M.D.      PHYSICAL EXAM BP 121/66  Pulse 75  Temp 97.9 F (36.6 C) (Oral)  Resp 20  Ht 5\' 5"  (1.651 m)  Wt 125 lb 3.5 oz (56.8 kg)  BMI 20.84 kg/m2  SpO2 98% General: Well developed, well nourished, in no acute distress Head: Eyes PERRLA, No xanthomas.   Normal cephalic and atramatic  Lungs: Clear bilaterally to auscultation and percussion.No wheezes, improved from last assessment. Heart: HIRR, some slowing in rate during auscultation No MRG .  Pulses are 2+ & equal.            No carotid bruit. No JVD.  No abdominal bruits. No femoral bruits. Abdomen: Bowel sounds are positive, abdomen soft and non-tender without masses or                  Hernia's noted. Msk:  Back normal, normal gait. Normal strength and tone for age. Extremities: No clubbing, cyanosis or edema.  DP +1 Neuro: Alert and oriented X 3. Psych:  Good affect, responds appropriately  TELEMETRY: Reviewed telemetry pt in: Atrial fib rates in the 80's  ASSESSMENT AND PLAN:  1. Atrial Fib: Heart rate controlled on Cardizem 90 mg QID, digoxin 250 mcgs daily, and atenolol 25 mg TID.  She is on coumadin per pharmacy. She is feeling better with better heart rate control. She is being transferred out of the unit today and will increase activity.   2. Acute Systolic Heart Failure: Continues diureses on lasix 40 mg IV BID.  Creatinine is stable  at 0.87.  Uncertain of dry wt at this time. Wts recorded show increased wt despite diureses of 6 liters. EF of 40% at per Echo this admission. Review of records to not show prior cardiac testing with stress test. May need to consider this for further evaluation of ischemia causing systolic dysfunction. ACE inhibitor can be considered if BP allows. She is running 90's to 120's systolic at present, while in the bed.  Increased activity will help to assess BP further.  3. COPD: Managed by Dr. Juanetta Gosling. Improved breathing status, with pt only on room air between  neb tx. Plans to move her out of the unit per Dr. Juanetta Gosling.  4. Hypokalemia: Improvement with replacement.   5. Anemia: Consider anemia profile. Will need to follow H&H closely, as patient is on coumadin.  [name] [name]. Lyman Bishop NP [facility] 06/08/2012, 8:29 AM  Patient examined chart reviewed  Afib rate well controlled.  Breathing status improved.  Cr staying stable Can have outpatient ischemic w/u once acute illness resolved  Charlton Haws 8:53 AM 06/08/2012

## 2012-06-08 NOTE — Progress Notes (Signed)
Physical Therapy Treatment Patient Details Name: [name] [name] MRN: [id number] DOB: [birth year] Today's Date: 06/08/2012 Time: 4010-2725 PT Time Calculation (min): 37 min  PT Assessment / Plan / Recommendation Comments on Treatment Session  Pt is doing well with overall strength and mobility.  She is still deconditioned but entirely functional and safe on her feet.  She should be able to transition to home at d.c.    Follow Up Recommendations        Does the patient have the potential to tolerate intense rehabilitation     Barriers to Discharge        Equipment Recommendations  None recommended by PT    Recommendations for Other Services    Frequency     Plan All goals met and education completed, patient dischaged from PT services    Precautions / Restrictions Restrictions Weight Bearing Restrictions: No   Pertinent Vitals/Pain     Mobility  Bed Mobility Supine to Sit: 7: Independent Transfers Sit to Stand: 7: Independent Ambulation/Gait Ambulation/Gait Assistance: 7: Independent Ambulation Distance (Feet): 350 Feet Assistive device: None Ambulation/Gait Assistance Details: ambulates slowly but has no gait instability Stairs: Yes Stairs Assistance: 6: Modified independent (Device/Increase time) Stair Management Technique: One rail Right;Step to pattern;Forwards Number of Stairs: 10  Wheelchair Mobility Wheelchair Mobility: No    Exercises General Exercises - Lower Extremity Straight Leg Raises: AROM;Both;10 reps;Supine Toe Raises: AROM;Both;10 reps;Standing Mini-Sqauts: AROM;Both;10 reps;Standing Other Exercises Other Exercises: bridging x 10 reps   PT Diagnosis:    PT Problem List:   PT Treatment Interventions:     PT Goals Acute Rehab PT Goals PT Goal: Supine/Side to Sit - Progress: Met PT Goal: Ambulate - Progress: Met PT Goal: Up/Down Stairs - Progress: Met  Visit Information  Last PT Received On: 06/08/12    Subjective Data  Subjective: feels  better   Cognition       Balance  Balance Balance Assessed: No  End of Session PT - End of Session Equipment Utilized During Treatment: Gait belt Activity Tolerance: Patient tolerated treatment well Patient left: in bed;with call bell/phone within reach;with bed alarm set   GP     Konrad Penta 06/08/2012, 2:49 PM

## 2012-06-08 NOTE — Progress Notes (Signed)
ANTICOAGULATION CONSULT NOTE - Initial Consult  Pharmacy Consult for Warfarin Indication: atrial fibrillation  No Known Allergies  Patient Measurements: Height: 5\' 5"  (165.1 cm) Weight: 125 lb 3.5 oz (56.8 kg) IBW/kg (Calculated) : 57   Vital Signs: Temp: 97.9 F (36.6 C) (11/07 0800) Temp src: Oral (11/07 0800) BP: 126/58 mmHg (11/07 0900) Pulse Rate: 77  (11/07 0900)  Labs:  Basename 06/08/12 0432 06/07/12 0442 06/06/12 1722 06/06/12 1113 06/06/12 0440 06/05/12 1329  HGB -- 10.6* -- -- 10.8* --  HCT -- 32.4* -- -- 31.8* --  PLT -- 203 -- -- -- --  APTT -- -- -- -- -- --  LABPROT 15.2 15.3* -- 16.2* -- --  INR 1.22 1.23 -- 1.33 -- --  HEPARINUNFRC -- -- -- -- -- --  CREATININE -- 0.87 0.89 -- 0.80 --  CKTOTAL -- -- -- -- -- --  CKMB -- -- -- -- -- --  TROPONINI -- -- -- -- -- <0.30    Estimated Creatinine Clearance: 64 ml/min (by C-G formula based on Cr of 0.87).  Medical History: Past Medical History  Diagnosis Date  . Dysrhythmia   . COPD (chronic obstructive pulmonary disease)   . Depression    Medications:  Scheduled:     . atenolol  25 mg Oral TID  . digoxin  250 mcg Oral Daily  . diltiazem  90 mg Oral QID  . enoxaparin (LOVENOX) injection  40 mg Subcutaneous Q24H  . furosemide  40 mg Intravenous Q12H  . insulin aspart  0-15 Units Subcutaneous TID WC  . insulin aspart  0-5 Units Subcutaneous QHS  . insulin glargine  5 Units Subcutaneous QHS  . levalbuterol  0.63 mg Nebulization Q4H  . [COMPLETED] living well with diabetes book   Does not apply Once  . methylPREDNISolone (SOLU-MEDROL) injection  40 mg Intravenous Q12H  . moxifloxacin  400 mg Oral Q2000  . pantoprazole  40 mg Oral Daily  . potassium chloride  40 mEq Oral TID  . sertraline  50 mg Oral Daily  . tiotropium  18 mcg Inhalation Daily  . [COMPLETED] warfarin  5 mg Oral ONCE-1800  . Warfarin - Pharmacist Dosing Inpatient   Does not apply q1800  . [DISCONTINUED] aspirin  325 mg Oral Daily    . [DISCONTINUED] atenolol  50 mg Oral Q breakfast  . [DISCONTINUED] sodium chloride  3 mL Intravenous Q12H    Assessment: 56yo female started on Coumadin for new onset Afib.  Overlapping with Lovenox 40mg  daily until INR>2. No change in INR overnight. No bleeding noted.   Goal of Therapy:  INR 2-3 Monitor platelets by anticoagulation protocol: Yes   Plan: Increase Coumadin 7.5mg  today x 1 INR daily  Errin Whitelaw, [facility] 06/08/2012,10:53 AM

## 2012-06-09 LAB — GLUCOSE, CAPILLARY: Glucose-Capillary: 164 mg/dL — ABNORMAL HIGH (ref 70–99)

## 2012-06-09 LAB — PROTIME-INR
INR: 1.4 (ref 0.00–1.49)
Prothrombin Time: 16.8 seconds — ABNORMAL HIGH (ref 11.6–15.2)

## 2012-06-09 MED ORDER — INSULIN GLARGINE 100 UNIT/ML ~~LOC~~ SOLN: 5.0000 [IU] | Freq: Every day | SUBCUTANEOUS | Status: DC

## 2012-06-09 MED ORDER — WARFARIN SODIUM 5 MG PO TABS: 5.0000 mg | ORAL_TABLET | Freq: Every day | ORAL | Status: DC

## 2012-06-09 MED ORDER — INSULIN ASPART 100 UNIT/ML ~~LOC~~ SOLN: 0.0000 [IU] | Freq: Every day | SUBCUTANEOUS | Status: DC

## 2012-06-09 MED ORDER — INSULIN ASPART 100 UNIT/ML ~~LOC~~ SOLN: 0.0000 [IU] | Freq: Three times a day (TID) | SUBCUTANEOUS | Status: DC

## 2012-06-09 MED ORDER — POTASSIUM CHLORIDE CRYS ER 20 MEQ PO TBCR: 40.0000 meq | EXTENDED_RELEASE_TABLET | Freq: Three times a day (TID) | ORAL | Status: DC

## 2012-06-09 MED ORDER — DILTIAZEM HCL ER COATED BEADS 360 MG PO CP24: 360.0000 mg | ORAL_CAPSULE | Freq: Every day | ORAL | Status: DC

## 2012-06-09 MED ORDER — PANTOPRAZOLE SODIUM 40 MG PO TBEC: 40.0000 mg | DELAYED_RELEASE_TABLET | Freq: Every day | ORAL | Status: DC

## 2012-06-09 MED ORDER — FUROSEMIDE 40 MG PO TABS: 40.0000 mg | ORAL_TABLET | Freq: Two times a day (BID) | ORAL | Status: DC

## 2012-06-09 MED ORDER — CEPHALEXIN 500 MG PO CAPS: 500.0000 mg | ORAL_CAPSULE | Freq: Four times a day (QID) | ORAL | Status: DC

## 2012-06-09 MED ORDER — WARFARIN SODIUM 7.5 MG PO TABS
7.5000 mg | ORAL_TABLET | Freq: Once | ORAL | Status: AC
  Administered 2012-06-09: 7.5 mg via ORAL
  Filled 2012-06-09: qty 1

## 2012-06-09 MED ORDER — ATENOLOL 25 MG PO TABS: 25.0000 mg | ORAL_TABLET | Freq: Three times a day (TID) | ORAL | Status: DC

## 2012-06-09 NOTE — Progress Notes (Signed)
Nursing secretary notified nurse that patient appeared to have 7 beat run of V Tach with a pause and another 3 beat run of V Tach.  Pt resting in bed in room with no complaints.  States she has been short of breath off and on with her COPD and CHF.  Dr. Juanetta Gosling on floor and notified.  Stated that he will check on the patient.

## 2012-06-09 NOTE — Progress Notes (Signed)
Subjective: She is much improved. Her chest x-ray showed essentially complete resolution of the findings of CHF. She is learning about diabetes. She is going to go home with her sister  Objective: Vital signs in last 24 hours: Temp:  [97.6 F (36.4 C)-97.7 F (36.5 C)] 97.7 F (36.5 C) (11/08 0651) Pulse Rate:  [50-85] 69  (11/08 0651) Resp:  [14-28] 18  (11/08 0651) BP: (109-126)/(46-65) 120/64 mmHg (11/08 0651) SpO2:  [36 %-100 %] 97 % (11/08 0735) Weight change:  Last BM Date: 06/09/12  Intake/Output from previous day: 11/07 0701 - 11/08 0700 In: 884 [P.O.:840; I.V.:40; IV Piggyback:4] Out: 2550 [Urine:2550]  PHYSICAL EXAM General appearance: alert, cooperative and no distress Resp: clear to auscultation bilaterally Cardio: irregularly irregular rhythm GI: soft, non-tender; bowel sounds normal; no masses,  no organomegaly Extremities: extremities normal, atraumatic, no cyanosis or edema  Lab Results:    Basic Metabolic Panel:  Basename 06/07/12 0442 06/06/12 1722  NA 139 133*  K 3.9 4.4  CL 103 97  CO2 25 22  GLUCOSE 168* 230*  BUN 27* 26*  CREATININE 0.87 0.89  CALCIUM 9.0 9.1  MG -- --  PHOS -- --   Liver Function Tests: No results found for this basename: AST:2,ALT:2,ALKPHOS:2,BILITOT:2,PROT:2,ALBUMIN:2 in the last 72 hours No results found for this basename: LIPASE:2,AMYLASE:2 in the last 72 hours No results found for this basename: AMMONIA:2 in the last 72 hours CBC:  Basename 06/07/12 0442  WBC 12.1*  NEUTROABS 11.5*  HGB 10.6*  HCT 32.4*  MCV 100.6*  PLT 203   Cardiac Enzymes: No results found for this basename: CKTOTAL:3,CKMB:3,CKMBINDEX:3,TROPONINI:3 in the last 72 hours BNP:  [facility] 06/06/12 1722  PROBNP 7326.0*   D-Dimer: No results found for this basename: DDIMER:2 in the last 72 hours CBG:  Basename 06/09/12 0718 06/08/12 2128 06/08/12 1629 06/08/12 1122 06/08/12 0722 06/07/12 2116  GLUCAP 129* 168* 134* 127* 126* 220*    Hemoglobin A1C: No results found for this basename: HGBA1C in the last 72 hours Fasting Lipid Panel: No results found for this basename: CHOL,HDL,LDLCALC,TRIG,CHOLHDL,LDLDIRECT in the last 72 hours Thyroid Function Tests:  Basename 06/07/12 0442  TSH 0.153*  T4TOTAL --  FREET4 --  T3FREE --  THYROIDAB --   Anemia Panel: No results found for this basename: VITAMINB12,FOLATE,FERRITIN,TIBC,IRON,RETICCTPCT in the last 72 hours Coagulation:  Basename 06/09/12 0441 06/08/12 0432  LABPROT 16.8* 15.2  INR 1.40 1.22   Urine Drug Screen: Drugs of Abuse  No results found for this basename: labopia, cocainscrnur, labbenz, amphetmu, thcu, labbarb    Alcohol Level: No results found for this basename: ETH:2 in the last 72 hours Urinalysis: No results found for this basename: COLORURINE:2,APPERANCEUR:2,LABSPEC:2,PHURINE:2,GLUCOSEU:2,HGBUR:2,BILIRUBINUR:2,KETONESUR:2,PROTEINUR:2,UROBILINOGEN:2,NITRITE:2,LEUKOCYTESUR:2 in the last 72 hours Misc. Labs:  ABGS No results found for this basename: PHART,PCO2,PO2ART,TCO2,HCO3 in the last 72 hours CULTURES Recent Results (from the past 240 hour(s))  URINE CULTURE     Status: Normal   Collection Time   06/04/12  9:30 PM      Component Value Range Status Comment   Specimen Description URINE, CATHETERIZED   Final    Special Requests NONE   Final    Culture  Setup Time 06/04/2012 23:20   Final    Colony Count NO GROWTH   Final    Culture NO GROWTH   Final    Report Status 06/06/2012 FINAL   Final   MRSA PCR SCREENING     Status: Normal   Collection Time   06/04/12 10:49 PM  Component Value Range Status Comment   MRSA by PCR NEGATIVE  NEGATIVE Final    Studies/Results: Dg Chest Port 1 View  06/07/2012  *RADIOLOGY REPORT*  Clinical Data: CHF  PORTABLE CHEST - 1 VIEW  Comparison: Portable exam 1537 hours compared to 06/04/2012  Findings: Marked enlargement of cardiac silhouette. Decreased pulmonary vascular congestion. Resolution of  pulmonary edema seen on previous exam. Tiny residual left pleural effusion. Lungs otherwise clear. No pneumothorax. Bones demineralized.  IMPRESSION: Resolved CHF.   Original Report Authenticated By: Ulyses Southward, M.D.     Medications:  Prior to Admission:  Prescriptions prior to admission  Medication Sig Dispense Refill  . ALPRAZolam (XANAX) 0.5 MG tablet Take 0.5 mg by mouth daily as needed. Nerves/anxiety      . aspirin 325 MG EC tablet Take 325 mg by mouth every morning.        Marland Kitchen atenolol (TENORMIN) 50 MG tablet Take 50 mg by mouth every morning.        . digoxin (LANOXIN) 0.25 MG tablet Take 250 mcg by mouth every morning.        . diltiazem (CARDIZEM CD) 180 MG 24 hr capsule Take 180 mg by mouth every morning.        . sertraline (ZOLOFT) 50 MG tablet Take 50 mg by mouth daily.      Marland Kitchen tiotropium (SPIRIVA) 18 MCG inhalation capsule Place 18 mcg into inhaler and inhale daily.       Scheduled:   . atenolol  25 mg Oral TID  . digoxin  250 mcg Oral Daily  . diltiazem  90 mg Oral QID  . enoxaparin (LOVENOX) injection  40 mg Subcutaneous Q24H  . furosemide  40 mg Intravenous Q12H  . insulin aspart  0-15 Units Subcutaneous TID WC  . insulin aspart  0-5 Units Subcutaneous QHS  . insulin glargine  5 Units Subcutaneous QHS  . levalbuterol  0.63 mg Nebulization Q4H  . methylPREDNISolone (SOLU-MEDROL) injection  40 mg Intravenous Q12H  . moxifloxacin  400 mg Oral Q2000  . pantoprazole  40 mg Oral Daily  . potassium chloride  40 mEq Oral TID  . sertraline  50 mg Oral Daily  . tiotropium  18 mcg Inhalation Daily  . [COMPLETED] warfarin  7.5 mg Oral ONCE-1800  . Warfarin - Pharmacist Dosing Inpatient   Does not apply q1800   Continuous:  ZOX:[id number], acetaminophen, ALPRAZolam, HYDROcodone-acetaminophen, morphine injection, simethicone  Assesment: She was admitted with COPD exacerbation, atrial fibrillation with rapid ventricular response and congestive heart failure. She was found to  have diabetes. She has markedly improved and is ready for discharge Principal Problem:  *CHF (congestive heart failure) Active Problems:  Atrial fibrillation with rapid ventricular response  COPD exacerbation  Anxiety  Depression  Hypokalemia  Diabetes mellitus due to underlying condition  Anemia    Plan: Discharge home today    LOS: 5 days   Brookie Wayment L 06/09/2012, 8:57 AM

## 2012-06-09 NOTE — Progress Notes (Signed)
ANTICOAGULATION CONSULT NOTE   Pharmacy Consult for Warfarin Indication: atrial fibrillation  No Known Allergies  Patient Measurements: Height: 5\' 5"  (165.1 cm) Weight: 125 lb 3.5 oz (56.8 kg) IBW/kg (Calculated) : 57   Vital Signs: Temp: 97.7 F (36.5 C) (11/08 0651) Temp src: Oral (11/08 0651) BP: 127/77 mmHg (11/08 1056) Pulse Rate: 88  (11/08 1056)  Labs:  Basename 06/09/12 0441 06/08/12 0432 06/07/12 0442 06/06/12 1722  HGB -- -- 10.6* --  HCT -- -- 32.4* --  PLT -- -- 203 --  APTT -- -- -- --  LABPROT 16.8* 15.2 15.3* --  INR 1.40 1.22 1.23 --  HEPARINUNFRC -- -- -- --  CREATININE -- -- 0.87 0.89  CKTOTAL -- -- -- --  CKMB -- -- -- --  TROPONINI -- -- -- --   Estimated Creatinine Clearance: 64 ml/min (by C-G formula based on Cr of 0.87).  Medical History: Past Medical History  Diagnosis Date  . Dysrhythmia   . COPD (chronic obstructive pulmonary disease)   . Depression    Medications:  Scheduled:     . atenolol  25 mg Oral TID  . digoxin  250 mcg Oral Daily  . diltiazem  90 mg Oral QID  . enoxaparin (LOVENOX) injection  40 mg Subcutaneous Q24H  . furosemide  40 mg Intravenous Q12H  . insulin aspart  0-15 Units Subcutaneous TID WC  . insulin aspart  0-5 Units Subcutaneous QHS  . insulin glargine  5 Units Subcutaneous QHS  . levalbuterol  0.63 mg Nebulization Q4H  . methylPREDNISolone (SOLU-MEDROL) injection  40 mg Intravenous Q12H  . moxifloxacin  400 mg Oral Q2000  . pantoprazole  40 mg Oral Daily  . potassium chloride  40 mEq Oral TID  . sertraline  50 mg Oral Daily  . tiotropium  18 mcg Inhalation Daily  . [COMPLETED] warfarin  7.5 mg Oral ONCE-1800  . warfarin  7.5 mg Oral ONCE-1800  . Warfarin - Pharmacist Dosing Inpatient   Does not apply q1800    Assessment: 56yo female started on Coumadin for new onset Afib.  Overlapping with Lovenox 40mg  daily until INR>2. Sluggish INR response. No bleeding noted.   Goal of Therapy:  INR  2-3 Monitor platelets by anticoagulation protocol: Yes   Plan: Coumadin 7.5mg  today x 1 INR daily  Valrie Hart A 06/09/2012,11:04 AM

## 2012-06-09 NOTE — Progress Notes (Signed)
Dr. Felecia Shelling paged at 1350.  Pt is showing Vtach on the heart monitor.  Heart monitor changed andVtach sh  Pt asymptomatic.  VS WNL.  Dr. Felecia Shelling returned page and stated no changes.  Continue with discharge.

## 2012-06-10 NOTE — Progress Notes (Signed)
Pt and pt's son and daughter-in-law provided with discharge instructions.  Pt provided understanding of d/c instructions with teach back for diabetes, heart failure and coumadin.  Pt provided return demonstration with insulin and insulin injections.  Home health to follow patient at home.  Pt's IV removed.  Pt tolerated well.  Pt transported via w/c by NT to main entrance for discharge.

## 2012-06-12 ENCOUNTER — Inpatient Hospital Stay (HOSPITAL_COMMUNITY): Payer: BC Managed Care – PPO

## 2012-06-12 ENCOUNTER — Emergency Department (HOSPITAL_COMMUNITY): Payer: BC Managed Care – PPO

## 2012-06-12 ENCOUNTER — Inpatient Hospital Stay (HOSPITAL_COMMUNITY)
Admission: EM | Admit: 2012-06-12 | Discharge: 2012-06-28 | DRG: 581 | Disposition: A | Discharge status: Home / Self Care | Payer: BC Managed Care – PPO | Attending: Cardiology | Admitting: Cardiology

## 2012-06-12 ENCOUNTER — Telehealth: Payer: Self-pay | Admitting: *Deleted

## 2012-06-12 ENCOUNTER — Encounter (HOSPITAL_COMMUNITY): Payer: Self-pay | Admitting: Emergency Medicine

## 2012-06-12 DIAGNOSIS — Z794 Long term (current) use of insulin: Secondary | ICD-10-CM

## 2012-06-12 DIAGNOSIS — Z7901 Long term (current) use of anticoagulants: Secondary | ICD-10-CM

## 2012-06-12 DIAGNOSIS — J4489 Other specified chronic obstructive pulmonary disease: Secondary | ICD-10-CM | POA: Diagnosis present

## 2012-06-12 DIAGNOSIS — R7401 Elevation of levels of liver transaminase levels: Secondary | ICD-10-CM | POA: Diagnosis present

## 2012-06-12 DIAGNOSIS — J9600 Acute respiratory failure, unspecified whether with hypoxia or hypercapnia: Secondary | ICD-10-CM

## 2012-06-12 DIAGNOSIS — I2510 Atherosclerotic heart disease of native coronary artery without angina pectoris: Secondary | ICD-10-CM | POA: Diagnosis present

## 2012-06-12 DIAGNOSIS — I442 Atrioventricular block, complete: Secondary | ICD-10-CM | POA: Diagnosis present

## 2012-06-12 DIAGNOSIS — E119 Type 2 diabetes mellitus without complications: Secondary | ICD-10-CM | POA: Diagnosis present

## 2012-06-12 DIAGNOSIS — I498 Other specified cardiac arrhythmias: Secondary | ICD-10-CM | POA: Diagnosis present

## 2012-06-12 DIAGNOSIS — E876 Hypokalemia: Secondary | ICD-10-CM | POA: Diagnosis not present

## 2012-06-12 DIAGNOSIS — R092 Respiratory arrest: Secondary | ICD-10-CM

## 2012-06-12 DIAGNOSIS — E8801 Alpha-1-antitrypsin deficiency: Secondary | ICD-10-CM | POA: Diagnosis present

## 2012-06-12 DIAGNOSIS — R791 Abnormal coagulation profile: Secondary | ICD-10-CM | POA: Diagnosis present

## 2012-06-12 DIAGNOSIS — J9819 Other pulmonary collapse: Secondary | ICD-10-CM | POA: Diagnosis present

## 2012-06-12 DIAGNOSIS — E875 Hyperkalemia: Secondary | ICD-10-CM

## 2012-06-12 DIAGNOSIS — E871 Hypo-osmolality and hyponatremia: Secondary | ICD-10-CM | POA: Diagnosis present

## 2012-06-12 DIAGNOSIS — I5022 Chronic systolic (congestive) heart failure: Secondary | ICD-10-CM

## 2012-06-12 DIAGNOSIS — R197 Diarrhea, unspecified: Secondary | ICD-10-CM

## 2012-06-12 DIAGNOSIS — F411 Generalized anxiety disorder: Secondary | ICD-10-CM | POA: Diagnosis present

## 2012-06-12 DIAGNOSIS — I319 Disease of pericardium, unspecified: Secondary | ICD-10-CM

## 2012-06-12 DIAGNOSIS — I5023 Acute on chronic systolic (congestive) heart failure: Secondary | ICD-10-CM | POA: Diagnosis present

## 2012-06-12 DIAGNOSIS — J189 Pneumonia, unspecified organism: Secondary | ICD-10-CM | POA: Diagnosis present

## 2012-06-12 DIAGNOSIS — I4510 Unspecified right bundle-branch block: Secondary | ICD-10-CM

## 2012-06-12 DIAGNOSIS — N179 Acute kidney failure, unspecified: Secondary | ICD-10-CM | POA: Diagnosis not present

## 2012-06-12 DIAGNOSIS — R7402 Elevation of levels of lactic acid dehydrogenase (LDH): Secondary | ICD-10-CM | POA: Diagnosis present

## 2012-06-12 DIAGNOSIS — Z950 Presence of cardiac pacemaker: Secondary | ICD-10-CM | POA: Diagnosis present

## 2012-06-12 DIAGNOSIS — I4891 Unspecified atrial fibrillation: Secondary | ICD-10-CM | POA: Diagnosis present

## 2012-06-12 DIAGNOSIS — Z7982 Long term (current) use of aspirin: Secondary | ICD-10-CM

## 2012-06-12 DIAGNOSIS — Z79899 Other long term (current) drug therapy: Secondary | ICD-10-CM

## 2012-06-12 DIAGNOSIS — D649 Anemia, unspecified: Secondary | ICD-10-CM

## 2012-06-12 DIAGNOSIS — I509 Heart failure, unspecified: Secondary | ICD-10-CM | POA: Diagnosis present

## 2012-06-12 DIAGNOSIS — I10 Essential (primary) hypertension: Secondary | ICD-10-CM | POA: Diagnosis not present

## 2012-06-12 DIAGNOSIS — I428 Other cardiomyopathies: Secondary | ICD-10-CM | POA: Diagnosis present

## 2012-06-12 DIAGNOSIS — I469 Cardiac arrest, cause unspecified: Secondary | ICD-10-CM

## 2012-06-12 DIAGNOSIS — A419 Sepsis, unspecified organism: Secondary | ICD-10-CM

## 2012-06-12 DIAGNOSIS — R5381 Other malaise: Secondary | ICD-10-CM | POA: Diagnosis present

## 2012-06-12 DIAGNOSIS — N289 Disorder of kidney and ureter, unspecified: Secondary | ICD-10-CM

## 2012-06-12 DIAGNOSIS — J449 Chronic obstructive pulmonary disease, unspecified: Secondary | ICD-10-CM | POA: Diagnosis present

## 2012-06-12 LAB — COMPREHENSIVE METABOLIC PANEL
AST: 113 U/L — ABNORMAL HIGH (ref 0–37)
Albumin: 3.2 g/dL — ABNORMAL LOW (ref 3.5–5.2)
Alkaline Phosphatase: 99 U/L (ref 39–117)
BUN: 50 mg/dL — ABNORMAL HIGH (ref 6–23)
BUN: 35 mg/dL — ABNORMAL HIGH (ref 6–23)
CO2: 21 mEq/L (ref 19–32)
Calcium: 8.7 mg/dL (ref 8.4–10.5)
Chloride: 93 mEq/L — ABNORMAL LOW (ref 96–112)
Chloride: 97 mEq/L (ref 96–112)
Creatinine, Ser: 1.5 mg/dL — ABNORMAL HIGH (ref 0.50–1.10)
Creatinine, Ser: 1.08 mg/dL (ref 0.50–1.10)
GFR calc non Af Amer: 38 mL/min — ABNORMAL LOW (ref >90)
Total Bilirubin: 0.4 mg/dL (ref 0.3–1.2)
Total Bilirubin: 1 mg/dL (ref 0.3–1.2)

## 2012-06-12 LAB — CBC
HCT: 36.1 % (ref 36.0–46.0)
MCH: 33 pg (ref 26.0–34.0)
MCHC: 34.9 g/dL (ref 30.0–36.0)
MCV: 94.5 fL (ref 78.0–100.0)
Platelets: 163 10*3/uL (ref 150–400)
RDW: 15.1 % (ref 11.5–15.5)

## 2012-06-12 LAB — T4, FREE: Free T4: 1.7 ng/dL (ref 0.80–1.80)

## 2012-06-12 LAB — CBC WITH DIFFERENTIAL/PLATELET
HCT: 40.2 % (ref 36.0–46.0)
Hemoglobin: 13.4 g/dL (ref 12.0–15.0)
Lymphocytes Relative: 25 % (ref 12–46)
Lymphs Abs: 3.2 10*3/uL (ref 0.7–4.0)
MCHC: 33.3 g/dL (ref 30.0–36.0)
Monocytes Absolute: 0.8 10*3/uL (ref 0.1–1.0)
Monocytes Relative: 6 % (ref 3–12)
Neutro Abs: 8.5 10*3/uL — ABNORMAL HIGH (ref 1.7–7.7)
RBC: 4.05 MIL/uL (ref 3.87–5.11)
WBC: 12.7 10*3/uL — ABNORMAL HIGH (ref 4.0–10.5)

## 2012-06-12 LAB — PROTIME-INR
INR: 2.15 — ABNORMAL HIGH (ref 0.00–1.49)
Prothrombin Time: 22.9 seconds — ABNORMAL HIGH (ref 11.6–15.2)

## 2012-06-12 LAB — URINALYSIS, ROUTINE W REFLEX MICROSCOPIC
Glucose, UA: NEGATIVE mg/dL
Hgb urine dipstick: NEGATIVE
Protein, ur: NEGATIVE mg/dL
pH: 5.5 (ref 5.0–8.0)

## 2012-06-12 LAB — BLOOD GAS, ARTERIAL
Acid-Base Excess: 5.4 mmol/L — ABNORMAL HIGH (ref 0.0–2.0)
Bicarbonate: 29.1 mEq/L — ABNORMAL HIGH (ref 20.0–24.0)
Drawn by: 331001
FIO2: 100 %
MECHVT: 500 mL
O2 Saturation: 93.7 %
Patient temperature: 37
RATE: 14 resp/min
TCO2: 23.4 mmol/L (ref 0–100)
pCO2 arterial: 40.7 mmHg (ref 35.0–45.0)
pH, Arterial: 7.37 (ref 7.350–7.450)
pO2, Arterial: 184 mmHg — ABNORMAL HIGH (ref 80.0–100.0)

## 2012-06-12 LAB — TSH: TSH: 0.537 u[IU]/mL (ref 0.350–4.500)

## 2012-06-12 LAB — PRO B NATRIURETIC PEPTIDE
Pro B Natriuretic peptide (BNP): 3356 pg/mL — ABNORMAL HIGH (ref 0–125)
Pro B Natriuretic peptide (BNP): 4270 pg/mL — ABNORMAL HIGH (ref 0–125)

## 2012-06-12 LAB — PHOSPHORUS: Phosphorus: 4.7 mg/dL — ABNORMAL HIGH (ref 2.3–4.6)

## 2012-06-12 LAB — MAGNESIUM: Magnesium: 1.5 mg/dL (ref 1.5–2.5)

## 2012-06-12 LAB — POTASSIUM
Potassium: 7.7 mEq/L (ref 3.5–5.1)
Potassium: 5.6 mEq/L — ABNORMAL HIGH (ref 3.5–5.1)

## 2012-06-12 LAB — TROPONIN I
Troponin I: <0.3 ng/mL (ref <0.30)
Troponin I: <0.3 ng/mL (ref <0.30)
Troponin I: <0.3 ng/mL (ref <0.30)

## 2012-06-12 LAB — LACTIC ACID, PLASMA: Lactic Acid, Venous: 0.8 mmol/L (ref 0.5–2.2)

## 2012-06-12 LAB — APTT: aPTT: 31 seconds (ref 24–37)

## 2012-06-12 LAB — DIC (DISSEMINATED INTRAVASCULAR COAGULATION)PANEL
Fibrinogen: 243 mg/dL (ref 204–475)
Smear Review: NONE SEEN

## 2012-06-12 LAB — GLUCOSE, CAPILLARY: Glucose-Capillary: 80 mg/dL (ref 70–99)

## 2012-06-12 MED ORDER — PROPOFOL 10 MG/ML IV EMUL
5.0000 ug/kg/min | INTRAVENOUS | Status: DC
  Administered 2012-06-12: 35 ug/kg/min via INTRAVENOUS

## 2012-06-12 MED ORDER — INSULIN ASPART 100 UNIT/ML ~~LOC~~ SOLN
2.0000 [IU] | SUBCUTANEOUS | Status: DC
  Administered 2012-06-14 – 2012-06-16 (×2): 4 [IU] via SUBCUTANEOUS
  Administered 2012-06-16 – 2012-06-19 (×8): 2 [IU] via SUBCUTANEOUS
  Administered 2012-06-19 – 2012-06-20 (×2): 4 [IU] via SUBCUTANEOUS
  Administered 2012-06-20: 2 [IU] via SUBCUTANEOUS

## 2012-06-12 MED ORDER — SODIUM CHLORIDE 0.9 % IJ SOLN: 10.0000 mL | INTRAMUSCULAR | Status: DC | PRN

## 2012-06-12 MED ORDER — SODIUM CHLORIDE 0.9 % IV BOLUS (SEPSIS)
1000.0000 mL | Freq: Once | INTRAVENOUS | Status: AC
  Administered 2012-06-12: 1000 mL via INTRAVENOUS

## 2012-06-12 MED ORDER — SODIUM CHLORIDE 0.9 % IV SOLN
INTRAVENOUS | Status: DC
  Administered 2012-06-12: 15 mL/h via INTRAVENOUS

## 2012-06-12 MED ORDER — SODIUM CHLORIDE 0.9 % IV SOLN
Freq: Once | INTRAVENOUS | Status: AC
  Administered 2012-06-12: 02:00:00 via INTRAVENOUS

## 2012-06-12 MED ORDER — BIOTENE DRY MOUTH MT LIQD
15.0000 mL | Freq: Four times a day (QID) | OROMUCOSAL | Status: DC
  Administered 2012-06-12 – 2012-06-14 (×8): 15 mL via OROMUCOSAL

## 2012-06-12 MED ORDER — DEXTROSE 50 % IV SOLN
1.0000 | Freq: Once | INTRAVENOUS | Status: AC
  Administered 2012-06-12: 50 mL via INTRAVENOUS

## 2012-06-12 MED ORDER — ACETAMINOPHEN 160 MG/5ML PO SOLN
650.0000 mg | Freq: Four times a day (QID) | ORAL | Status: DC | PRN
  Filled 2012-06-12: qty 20.3

## 2012-06-12 MED ORDER — LIDOCAINE HCL (CARDIAC) 20 MG/ML IV SOLN
INTRAVENOUS | Status: AC
  Filled 2012-06-12: qty 5

## 2012-06-12 MED ORDER — VANCOMYCIN HCL 500 MG IV SOLR
500.0000 mg | INTRAVENOUS | Status: DC
  Administered 2012-06-13 – 2012-06-15 (×3): 500 mg via INTRAVENOUS
  Filled 2012-06-12 (×3): qty 500

## 2012-06-12 MED ORDER — DEXTROSE 50 % IV SOLN
INTRAVENOUS | Status: AC
  Filled 2012-06-12: qty 50

## 2012-06-12 MED ORDER — LORAZEPAM 2 MG/ML IJ SOLN
INTRAMUSCULAR | Status: AC
  Administered 2012-06-12: 05:00:00
  Filled 2012-06-12: qty 1

## 2012-06-12 MED ORDER — PROPOFOL 10 MG/ML IV EMUL
INTRAVENOUS | Status: AC
  Administered 2012-06-12: 25 ug/kg/min
  Filled 2012-06-12: qty 100

## 2012-06-12 MED ORDER — HEPARIN SODIUM (PORCINE) 5000 UNIT/ML IJ SOLN
5000.0000 [IU] | Freq: Three times a day (TID) | INTRAMUSCULAR | Status: DC
  Administered 2012-06-12 – 2012-06-13 (×3): 5000 [IU] via SUBCUTANEOUS
  Filled 2012-06-12 (×6): qty 1

## 2012-06-12 MED ORDER — LORAZEPAM 2 MG/ML IJ SOLN
INTRAMUSCULAR | Status: AC
  Administered 2012-06-12: 2 mg via INTRAVENOUS
  Filled 2012-06-12: qty 1

## 2012-06-12 MED ORDER — SODIUM CHLORIDE 0.9 % IV SOLN
1.0000 mg/h | INTRAVENOUS | Status: DC
  Administered 2012-06-12: 3 mg/h via INTRAVENOUS
  Filled 2012-06-12: qty 10

## 2012-06-12 MED ORDER — INSULIN ASPART 100 UNIT/ML ~~LOC~~ SOLN
SUBCUTANEOUS | Status: AC
  Administered 2012-06-12: 10 [IU]
  Filled 2012-06-12: qty 1

## 2012-06-12 MED ORDER — ROCURONIUM BROMIDE 50 MG/5ML IV SOLN
INTRAVENOUS | Status: AC
  Filled 2012-06-12: qty 2

## 2012-06-12 MED ORDER — CHLORHEXIDINE GLUCONATE 0.12 % MT SOLN
15.0000 mL | Freq: Two times a day (BID) | OROMUCOSAL | Status: DC
  Administered 2012-06-12 – 2012-06-14 (×5): 15 mL via OROMUCOSAL
  Filled 2012-06-12 (×5): qty 15

## 2012-06-12 MED ORDER — SUCCINYLCHOLINE CHLORIDE 20 MG/ML IJ SOLN
INTRAMUSCULAR | Status: AC
  Administered 2012-06-12: 100 mg
  Filled 2012-06-12: qty 1

## 2012-06-12 MED ORDER — MIDAZOLAM HCL 2 MG/2ML IJ SOLN
INTRAMUSCULAR | Status: AC
  Administered 2012-06-12: 2 mg
  Filled 2012-06-12: qty 2

## 2012-06-12 MED ORDER — MIDAZOLAM BOLUS VIA INFUSION
1.0000 mg | INTRAVENOUS | Status: DC | PRN
  Filled 2012-06-12: qty 2

## 2012-06-12 MED ORDER — SODIUM CHLORIDE 0.9 % IV SOLN
25.0000 ug/h | INTRAVENOUS | Status: DC
  Administered 2012-06-12: 50 ug/h via INTRAVENOUS
  Filled 2012-06-12: qty 50

## 2012-06-12 MED ORDER — SODIUM CHLORIDE 0.9 % IJ SOLN: 10.0000 mL | Freq: Two times a day (BID) | INTRAMUSCULAR | Status: DC

## 2012-06-12 MED ORDER — PROPOFOL 10 MG/ML IV EMUL: 5.0000 ug/kg/min | Freq: Once | INTRAVENOUS | Status: DC

## 2012-06-12 MED ORDER — LORAZEPAM 2 MG/ML IJ SOLN
2.0000 mg | Freq: Once | INTRAMUSCULAR | Status: AC
  Administered 2012-06-12: 2 mg via INTRAVENOUS

## 2012-06-12 MED ORDER — METRONIDAZOLE IN NACL 5-0.79 MG/ML-% IV SOLN
500.0000 mg | Freq: Three times a day (TID) | INTRAVENOUS | Status: DC
  Administered 2012-06-12 – 2012-06-14 (×7): 500 mg via INTRAVENOUS
  Filled 2012-06-12 (×9): qty 100

## 2012-06-12 MED ORDER — SUCCINYLCHOLINE CHLORIDE 20 MG/ML IJ SOLN
INTRAMUSCULAR | Status: AC
  Filled 2012-06-12: qty 1

## 2012-06-12 MED ORDER — VANCOMYCIN 50 MG/ML ORAL SOLUTION
125.0000 mg | Freq: Four times a day (QID) | ORAL | Status: DC
  Administered 2012-06-12 – 2012-06-14 (×9): 125 mg via ORAL
  Filled 2012-06-12 (×12): qty 2.5

## 2012-06-12 MED ORDER — ATROPINE SULFATE 1 MG/ML IJ SOLN
INTRAMUSCULAR | Status: AC | PRN
  Administered 2012-06-12: .5 mg via INTRAVENOUS

## 2012-06-12 MED ORDER — PROPOFOL 10 MG/ML IV EMUL
INTRAVENOUS | Status: AC
  Filled 2012-06-12: qty 100

## 2012-06-12 MED ORDER — PIPERACILLIN-TAZOBACTAM 3.375 G IVPB
3.3750 g | Freq: Three times a day (TID) | INTRAVENOUS | Status: DC
  Administered 2012-06-12 – 2012-06-15 (×9): 3.375 g via INTRAVENOUS
  Filled 2012-06-12 (×11): qty 50

## 2012-06-12 MED ORDER — DIPHENOXYLATE-ATROPINE 2.5-0.025 MG PO TABS
2.0000 | ORAL_TABLET | Freq: Once | ORAL | Status: AC
  Administered 2012-06-12: 2 via ORAL
  Filled 2012-06-12: qty 2

## 2012-06-12 MED ORDER — VANCOMYCIN HCL IN DEXTROSE 1-5 GM/200ML-% IV SOLN
1000.0000 mg | Freq: Once | INTRAVENOUS | Status: AC
  Administered 2012-06-12: 1000 mg via INTRAVENOUS
  Filled 2012-06-12: qty 200

## 2012-06-12 MED ORDER — SODIUM CHLORIDE 0.9 % IV SOLN: INTRAVENOUS | Status: DC

## 2012-06-12 MED ORDER — INSULIN REGULAR HUMAN 100 UNIT/ML IJ SOLN
10.0000 [IU] | Freq: Once | INTRAMUSCULAR | Status: DC
  Filled 2012-06-12: qty 0.1

## 2012-06-12 MED ORDER — ONDANSETRON HCL 4 MG/2ML IJ SOLN
4.0000 mg | Freq: Once | INTRAMUSCULAR | Status: AC
  Administered 2012-06-12: 4 mg via INTRAVENOUS
  Filled 2012-06-12: qty 2

## 2012-06-12 MED ORDER — DOPAMINE-DEXTROSE 3.2-5 MG/ML-% IV SOLN: 5.0000 ug/kg/min | Freq: Once | INTRAVENOUS | Status: DC

## 2012-06-12 MED ORDER — FENTANYL CITRATE 0.05 MG/ML IJ SOLN
50.0000 ug | INTRAMUSCULAR | Status: DC | PRN
  Administered 2012-06-12: 100 ug via INTRAVENOUS
  Filled 2012-06-12: qty 2

## 2012-06-12 MED ORDER — PIPERACILLIN-TAZOBACTAM 3.375 G IVPB
3.3750 g | Freq: Once | INTRAVENOUS | Status: AC
  Administered 2012-06-12: 3.375 g via INTRAVENOUS
  Filled 2012-06-12: qty 50

## 2012-06-12 MED ORDER — LORAZEPAM 2 MG/ML IJ SOLN
INTRAMUSCULAR | Status: AC
  Filled 2012-06-12: qty 1

## 2012-06-12 MED ORDER — ETOMIDATE 2 MG/ML IV SOLN
INTRAVENOUS | Status: AC
  Administered 2012-06-12: 8 mg
  Filled 2012-06-12: qty 20

## 2012-06-12 MED ORDER — ONDANSETRON HCL 4 MG/2ML IJ SOLN
INTRAMUSCULAR | Status: AC
  Filled 2012-06-12: qty 2

## 2012-06-12 MED ORDER — NOREPINEPHRINE BITARTRATE 1 MG/ML IJ SOLN
2.0000 ug/min | INTRAVENOUS | Status: DC
  Administered 2012-06-12: 2 ug/min via INTRAVENOUS
  Filled 2012-06-12: qty 4

## 2012-06-12 MED ORDER — PANTOPRAZOLE SODIUM 40 MG IV SOLR
INTRAVENOUS | Status: AC
  Filled 2012-06-12: qty 40

## 2012-06-12 MED ORDER — PANTOPRAZOLE SODIUM 40 MG IV SOLR
40.0000 mg | Freq: Every day | INTRAVENOUS | Status: DC
  Administered 2012-06-12 – 2012-06-13 (×2): 40 mg via INTRAVENOUS
  Filled 2012-06-12 (×3): qty 40

## 2012-06-12 MED ORDER — ETOMIDATE 2 MG/ML IV SOLN
INTRAVENOUS | Status: AC
  Filled 2012-06-12: qty 20

## 2012-06-12 MED ORDER — SODIUM CHLORIDE 0.9 % IV SOLN: 250.0000 mL | INTRAVENOUS | Status: DC | PRN

## 2012-06-12 MED ORDER — PANTOPRAZOLE SODIUM 40 MG IV SOLR
40.0000 mg | Freq: Once | INTRAVENOUS | Status: AC
  Administered 2012-06-12: 40 mg via INTRAVENOUS

## 2012-06-12 MED ORDER — ONDANSETRON HCL 4 MG/2ML IJ SOLN
4.0000 mg | Freq: Once | INTRAMUSCULAR | Status: AC
Start: 2012-06-12 — End: 2012-06-12
  Administered 2012-06-12: 4 mg via INTRAVENOUS

## 2012-06-12 MED ORDER — FENTANYL BOLUS VIA INFUSION
25.0000 ug | Freq: Four times a day (QID) | INTRAVENOUS | Status: DC | PRN
  Filled 2012-06-12: qty 100

## 2012-06-12 MED ORDER — SODIUM CHLORIDE 0.9 % IV SOLN
1.0000 g | Freq: Once | INTRAVENOUS | Status: AC
  Administered 2012-06-12: 1 g via INTRAVENOUS
  Filled 2012-06-12: qty 10

## 2012-06-12 MED ORDER — MORPHINE SULFATE 4 MG/ML IJ SOLN
4.0000 mg | Freq: Once | INTRAMUSCULAR | Status: AC
  Administered 2012-06-12: 4 mg via INTRAVENOUS
  Filled 2012-06-12: qty 1

## 2012-06-12 NOTE — Telephone Encounter (Signed)
Called to get [facility] to check INR today so pt didi have to come to office.  They informed me pt was in route to [facility] intubated and unresponsive.

## 2012-06-12 NOTE — ED Notes (Signed)
Critical K+ of 7.3.  Informed Dr. Colon Branch.  Order received for repeat K+

## 2012-06-12 NOTE — ED Provider Notes (Addendum)
History     CSN: [id number]  Arrival date & time 06/12/12  0153   First MD Initiated Contact with Patient 06/12/12 0204      Chief Complaint  Patient presents with  . Hypotension  . Nausea  . Emesis  . Diarrhea    (Consider location/radiation/quality/duration/timing/severity/associated sxs/prior treatment) HPI   [name] [name] is a 56 y.o. female  With a h/o COPD,alpha-1 anti-trypsin deficiency,    CHF, atrial fibrillation on coumadin, HTN, DM  brought in by ambulance, who presents to the Emergency Department complaining of abdominal pain, nausea, vomiting, diarrhea that began today. She had been discharged from the hospital Friday 06/09/12 for an admission with CHF requiring intubation and prolonged stay in the ICU. Developed abdominal pain today and began with vomiting and diarrhea tonight. Had eaten spagetti for dinner.   PCP Dr. Juanetta Gosling   Past Medical History  Diagnosis Date  . Paroxysmal supraventricular tachycardia     Lost to followup-2006  . Atrial fibrillation     Echocardiogram in 2004-borderline LV function; no valvular abnormalities  . Right bundle branch block   . Anxiety   . Anemia     2011: Hemoglobin of 11.4; hematocrit of 33.5; normal MCV    Past Surgical History  Procedure Date  . Breast excisional biopsy     Left x2    Family History  Problem Relation Age of Onset  . Cancer Father     carcinoma of the lung  . Cancer Sister     carcinoma of the lung    History  Substance Use Topics  . Smoking status: Never Smoker   . Smokeless tobacco: Never Used  . Alcohol Use: No    OB History    Grav Para Term Preterm Abortions TAB SAB Ect Mult Living                  Review of Systems  Constitutional: Negative for fever.       10 Systems reviewed and are negative for acute change except as noted in the HPI.  HENT: Negative for congestion.   Eyes: Negative for discharge and redness.  Respiratory: Negative for cough and shortness of breath.     Cardiovascular: Negative for chest pain.  Gastrointestinal: Positive for nausea, vomiting, abdominal pain and diarrhea.  Musculoskeletal: Negative for back pain.  Skin: Negative for rash.  Neurological: Negative for syncope, numbness and headaches.  Psychiatric/Behavioral:       No behavior change.    Allergies  Review of patient's allergies indicates no known allergies.  Home Medications   Current Outpatient Rx  Name  Route  Sig  Dispense  Refill  . CEPHALEXIN 500 MG PO CAPS   Oral   Take 500 mg by mouth 4 (four) times daily.         . FUROSEMIDE 40 MG PO TABS   Oral   Take 40 mg by mouth 2 (two) times daily.         . INSULIN ASPART 100 UNIT/ML [place] SOLN   Subcutaneous   Inject 0-15 Units into the skin 3 (three) times daily before meals.         . INSULIN ASPART 100 UNIT/ML [place] SOLN   Subcutaneous   Inject 0-5 Units into the skin at bedtime.         . INSULIN GLARGINE 100 UNIT/ML [place] SOLN   Subcutaneous   Inject 5 Units into the skin at bedtime.         Marland Kitchen  PANTOPRAZOLE SODIUM 40 MG PO TBEC   Oral   Take 40 mg by mouth daily.         Marland Kitchen POTASSIUM CHLORIDE CRYS ER 20 MEQ PO TBCR   Oral   Take 40 mEq by mouth 3 (three) times daily.         . SERTRALINE HCL 50 MG PO TABS   Oral   Take 50 mg by mouth daily.         . WARFARIN SODIUM 5 MG PO TABS   Oral   Take 5 mg by mouth daily.         Marland Kitchen ALPRAZOLAM 0.25 MG PO TABS   Oral   Take 0.5 mg by mouth as needed.          . ASPIRIN 325 MG PO TABS   Oral   Take 325 mg by mouth daily.           . ATENOLOL 50 MG PO TABS   Oral   Take 25 mg by mouth 3 (three) times daily.         Marland Kitchen DIGOXIN 0.125 MG PO TABS   Oral   Take 0.25 mg by mouth daily.         Marland Kitchen DILTIAZEM HCL ER COATED BEADS 180 MG PO CP24   Oral   Take 360 mg by mouth daily.         Marland Kitchen SPIRIVA HANDIHALER 18 MCG IN CAPS   Inhalation   Place 18 mcg into inhaler and inhale daily.          Marland Kitchen ZOLPIDEM TARTRATE 5 MG PO TABS    Oral   Take 5 mg by mouth at bedtime as needed.            BP 140/77  Pulse 83  Temp 98.6 F (37 C) (Core (Comment))  Resp 14  Wt 114 lb (51.71 kg)  SpO2 97%  Physical Exam  Nursing note and vitals reviewed. Constitutional: She appears well-developed and well-nourished. She appears distressed.  HENT:  Head: Normocephalic and atraumatic.  Right Ear: External ear normal.  Left Ear: External ear normal.  Mouth/Throat: Oropharynx is clear and moist.  Eyes: EOM are normal. Pupils are equal, round, and reactive to light. Right eye exhibits no discharge. Left eye exhibits no discharge.  Neck: Normal range of motion. Neck supple.  Cardiovascular:       irregular  Pulmonary/Chest: Effort normal. She has no wheezes. She has no rales. She exhibits no tenderness.       Crackles at both bases  Abdominal: Soft. Bowel sounds are normal. She exhibits no distension. There is no tenderness. There is no rebound and no guarding.  Musculoskeletal: She exhibits no edema and no tenderness.       Baseline ROM, no obvious new focal weakness.  Neurological:       Mental status and motor strength appears baseline for patient and situation.  Skin: No rash noted.       Sallow; multiple healing bruises from previous IV sites.   Psychiatric:       anxious    ED Course  Procedures (including critical care time)  Results for orders placed during the hospital encounter of 06/12/12  CBC WITH DIFFERENTIAL      Component Value Range   WBC 12.7 (*) 4.0 - 10.5 K/uL   RBC 4.05  3.87 - 5.11 MIL/uL   Hemoglobin 13.4  12.0 - 15.0 g/dL   HCT 16.1  09.6 -  46.0 %   MCV 99.3  78.0 - 100.0 fL   MCH 33.1  26.0 - 34.0 pg   MCHC 33.3  30.0 - 36.0 g/dL   RDW 16.1 (*) 09.6 - 04.5 %   Platelets 205  150 - 400 K/uL   Neutrophils Relative 67  43 - 77 %   Neutro Abs 8.5 (*) 1.7 - 7.7 K/uL   Lymphocytes Relative 25  12 - 46 %   Lymphs Abs 3.2  0.7 - 4.0 K/uL   Monocytes Relative 6  3 - 12 %   Monocytes Absolute  0.8  0.1 - 1.0 K/uL   Eosinophils Relative 2  0 - 5 %   Eosinophils Absolute 0.2  0.0 - 0.7 K/uL   Basophils Relative 0  0 - 1 %   Basophils Absolute 0.0  0.0 - 0.1 K/uL  COMPREHENSIVE METABOLIC PANEL      Component Value Range   Sodium 132 (*) 135 - 145 mEq/L   Potassium 7.3 (*) 3.5 - 5.1 mEq/L   Chloride 93 (*) 96 - 112 mEq/L   CO2 21  19 - 32 mEq/L   Glucose, Bld 285 (*) 70 - 99 mg/dL   BUN 50 (*) 6 - 23 mg/dL   Creatinine, Ser 4.09 (*) 0.50 - 1.10 mg/dL   Calcium 8.3 (*) 8.4 - 10.5 mg/dL   Total Protein 6.0  6.0 - 8.3 g/dL   Albumin 2.8 (*) 3.5 - 5.2 g/dL   AST 44 (*) 0 - 37 U/L   ALT 66 (*) 0 - 35 U/L   Alkaline Phosphatase 99  39 - 117 U/L   Total Bilirubin 0.4  0.3 - 1.2 mg/dL   GFR calc non Af Amer 38 (*) >90 mL/min   GFR calc Af Amer 44 (*) >90 mL/min  CULTURE, BLOOD (ROUTINE X 2)      Component Value Range   Specimen Description Blood BLOOD RIGHT ARM     Special Requests BOTTLES DRAWN AEROBIC AND ANAEROBIC 6CC     Culture PENDING     Report Status PENDING    URINALYSIS, ROUTINE W REFLEX MICROSCOPIC      Component Value Range   Color, Urine YELLOW  YELLOW   APPearance CLOUDY (*) CLEAR   Specific Gravity, Urine >1.030 (*) 1.005 - 1.030   pH 5.5  5.0 - 8.0   Glucose, UA NEGATIVE  NEGATIVE mg/dL   Hgb urine dipstick NEGATIVE  NEGATIVE   Bilirubin Urine NEGATIVE  NEGATIVE   Ketones, ur TRACE (*) NEGATIVE mg/dL   Protein, ur NEGATIVE  NEGATIVE mg/dL   Urobilinogen, UA 0.2  0.0 - 1.0 mg/dL   Nitrite NEGATIVE  NEGATIVE   Leukocytes, UA NEGATIVE  NEGATIVE  PRO B NATRIURETIC PEPTIDE      Component Value Range   Pro B Natriuretic peptide (BNP) 3356.0 (*) 0 - 125 pg/mL  BLOOD GAS, ARTERIAL      Component Value Range   O2 Content 1.0     Delivery systems VENTILATOR     Mode PRESSURE REGULATED VOLUME CONTROL     VT 500     Rate 14     Peep/cpap 5.0     pH, Arterial 7.370  7.350 - 7.450   pCO2 arterial 45.7 (*) 35.0 - 45.0 mmHg   pO2, Arterial 77.4 (*) 80.0 - 100.0  mmHg   Bicarbonate 25.8 (*) 20.0 - 24.0 mEq/L   TCO2 23.4  0 - 100 mmol/L   Acid-Base Excess 1.1  0.0 - 2.0 mmol/L   O2 Saturation 93.7     Patient temperature 37.0     Collection site LEFT RADIAL     Drawn by [phone]     Sample type ARTERIAL     Allens test (pass/fail) PASS  PASS  LACTIC ACID, PLASMA      Component Value Range   Lactic Acid, Venous 8.7 (*) 0.5 - 2.2 mmol/L  PROTIME-INR      Component Value Range   Prothrombin Time 23.1 (*) 11.6 - 15.2 seconds   INR 2.15 (*) 0.00 - 1.49  APTT      Component Value Range   aPTT 31  24 - 37 seconds  TROPONIN I      Component Value Range   Troponin I <0.30  <0.30 ng/mL  POTASSIUM      Component Value Range   Potassium 7.7 (*) 3.5 - 5.1 mEq/L  DIC (DISSEMINATED INTRAVASCULAR COAGULATION) PANEL      Component Value Range   Prothrombin Time 24.5 (*) 11.6 - 15.2 seconds   INR 2.33 (*) 0.00 - 1.49   aPTT 30  24 - 37 seconds   Fibrinogen 243  204 - 475 mg/dL   D-Dimer, Quant 6.04 (*) 0.00 - 0.48 ug/mL-FEU   Platelets 199  150 - 400 K/uL   Smear Review NO SCHISTOCYTES SEEN    POTASSIUM      Component Value Range   Potassium 5.6 (*) 3.5 - 5.1 mEq/L  TROPONIN I      Component Value Range   Troponin I <0.30  <0.30 ng/mL   Dg Chest Port 1 View  06/12/2012  *RADIOLOGY REPORT*  Clinical Data: Nausea, vomiting.  Post intubation.  PORTABLE CHEST - 1 VIEW  Comparison: 10/28/2004  Findings: Endotracheal tube is 4 cm above the carina.  There is cardiomegaly.  No confluent airspace opacities or effusions.  No acute bony abnormality.  IMPRESSION: Endotracheal tube 4 cm above the carina.  Cardiomegaly.   Original Report Authenticated By: Charlett Nose, M.D.       Date: 06/12/2012   0226  Rate: 51  Rhythm: wide QRS rhythm  QRS Axis: left  Intervals: wide QRS  ST/T Wave abnormalities: nonspecific ST/T changes  Conduction Disutrbances:right bundle branch block  Narrative Interpretation: inferior infarct, age undetermined, anteroseptal infarct     Old EKG Reviewed: EKG of 06/04/12 with atrial fib inferior and anteroseptal infarct unchanged.   #2  Date: 06/12/2012   0508  Rate: 67  Rhythm: atrial fibrillation  QRS Axis: left  Intervals: wide QRS  ST/T Wave abnormalities: nonspecific ST/T changes  Conduction Disutrbances:right bundle branch block  Narrative Interpretation: inr, anterior infarct unchanged and previously cited  Old EKG Reviewed: unchanged   0215 Difficulty finding the recent admission to the hospital. PATIENT HAS TWO MEDICAL RECORD NUMBERS. SECOND ONE IS UNDER ARIONNA WICAL [id number]. 0220 Patient expresses a sense of impending doom. States she is frightened. Family at the bedside.  4782 Called to room. Patient is  In asystole, unresponsive, labored breathing. Bag-mask-valve ventilation begun, Pacer pads placed. Vigorous stimulation and patient aroused and HR picked up to 50's. Poor respiratory effort.  0245 RSI initiated. Etomidate 8 mg, Succinylcholine 100 mg, Using l#4 Mack blade ETT 8 Endotracheal intubation completed. 0246 Family at the bedside. Reviewed with 2 sisters and son what has transpired so far, why the patient was intubated, awaiting labs, critical illness, stabilizing for probable transport to [facility].  INTUBATION Performed by: Annamarie Dawley.  Required items: required  blood products, implants, devices, and special equipment available Patient identity confirmed: provided demographic data and hospital-assigned identification number Time out: Immediately prior to procedure a "time out" was called to verify the correct patient, procedure, equipment, support staff and site/side marked as required. Indications: respiratory arrest Intubation method:Mack blade Preoxygenation: BVM Sedatives: 8 mg Etomidate Paralytic: 100 mg Succinylcholine Tube Size: 8 cuffed Post-procedure assessment: chest rise and ETCO2 monitor Breath sounds: equal and absent over the epigastrium Tube secured with: ETT holder Chest  x-ray interpreted by radiologist and me. Chest x-ray findings: endotracheal tube in appropriate position Patient tolerated the procedure well with no immediate complications.  0304 HR decreasing. 38.  BP 96/48 Atropine, 1 amp ordered. 0314 K level 7.3 called. Asked that it be repeated. Ordered Ca, D50 and insulin. 0329 Potassium confirmed at 7.7.  0330 Ativan 2 mg ordered for sedation.NG placed for vomiting.Family at the bedside. Brought up to date on patient status, current plan of care, continued stabilization for probable transport to [facility]. 0335 Ativan 2 mg ordered.Dopamine drip begun for BP 74/30, HR 57. 3 patent peripheral IVs. 0348 Lactic acid elevated. Patient is septic. Initiated antibiotics Zosyn, Vanc.Additonal IVF bolus. 0410 Patient is restless. Able to follow commands and will calm down with encouragement. Will squeeze your hand, moves all extremities.  9604 Central Line placed right femoral by Dr. Franky Macho.  0417 Propofol drip begun. 0430 Family brought to bedside. Reviewed patient status. Answered questions.  7:39 AM:  T/C to Dr. Emmaline Kluver, CCM, case discussed, including:  HPI, pertinent PM/SHx, VS/PE, dx testing, ED course and treatment.  Agreeable to transfer to [facility] ICU.  Requests to write temporary orders, 3100 or 2900 bed to Dr. Tyson Alias. 0555 Morphine ordered. 5409 Patient is resting. Not restless. HR 77-85, atrial fibrillation. BP 136/81. Skin color has improved.  0630 Family at the bedside Reviewed current patient status. Awaiting transport to [facility].  [phone] Room assignment obtained. Continues to rest. BP stable on Dopamine.  0715 Awaiting transport. ABG was not obtained. Have requested a second one.   MDM  Patient presents with nausea, vomiting, diarrhea and abdominal pain and developed asystole and respiratory failure requiring intubation, treatment for hyperkalemia, and initiation of code sepsis with IVF resuscitation, antibiotics, dopamine for hypotension, propofol for  sedation. Labs with elevated lactic acid, hyperkalemia requiring Ca, D50 and insulin,elevated LFTs which are new, and renal insufficiency.Most likely scenario is hyperkalemia resulting in abdominal discomfort, diarrhea, nausea,  Precipitating the arrythmia, cardiac arrest and subsequent respiratory arrest.Of concern is possibility of pulmonary emboli with atrial fibrillation and elevated d-dimer. INR is therapeutic but she was immobile while previously hospitalized, did have both abdominal pain an leg pain, and expresssed a feeling of impending doom. Further work up for PE and DVT on left leg would be prudent once she is more stable.  Patient has been stabilized for transfer to intensive care. Spoke with Dr. Emmaline Kluver, CCM at [facility]. She has accepted the patient in transfer. Pt stable in ED with no significant deterioration in condition. The patient appears reasonably stabilized for transfer considering the current resources, flow, and capabilities available in the ED at this time, and I doubt any other [facility] requiring further screening and/or treatment in the ED prior to transfer.  MDM Reviewed: previous chart, nursing note and vitals Reviewed previous: labs, ECG and x-ray Interpretation: labs, ECG and x-ray Consults: critical care and general surgery  CRITICAL CARE Performed by: Annamarie Dawley. Total critical care time: 90 Critical care time was exclusive of separately billable  procedures and treating other patients. Critical care was necessary to treat or prevent imminent or life-threatening deterioration. Critical care was time spent personally by me on the following activities: development of treatment plan with patient and/or surrogate as well as nursing, discussions with consultants, evaluation of patient's response to treatment, examination of patient, obtaining history from patient or surrogate, ordering and performing treatments and interventions, ordering and review of laboratory studies, ordering and  review of radiographic studies, pulse oximetry and re-evaluation of patient's condition.         Nicoletta Dress. Colon Branch, MD 06/12/12 Alm [place]ard  Nicoletta Dress. Colon Branch, MD 06/12/12 4540  Nicoletta Dress. Colon Branch, MD 06/12/12 9811  Nicoletta Dress. Colon Branch, MD 06/12/12 [phone]

## 2012-06-12 NOTE — ED Notes (Signed)
Patient was awake and talking during assessment. Patient stated that she felt like she was going to pass out. Patient then became unresponsive. MD called to bedside. Patient heartrate asystole on monitor; however, would increase to 45 - 50 bpm.  Bagged patient with bag valve mask. Respiratory paged.

## 2012-06-12 NOTE — ED Notes (Signed)
Family at bedside. Patient remains sedated and calm at this time.

## 2012-06-12 NOTE — ED Notes (Signed)
Patient c/o nausea, vomiting phlegm and diarrhea since last night.  Per EMS, patient hypotensive

## 2012-06-12 NOTE — Consult Note (Signed)
CARDIOLOGY CONSULT NOTE  Patient ID: [name] [name], MRN: [id number], DOB/AGE: 09-17-1955 56 y.o. Admit date: 06/12/2012   Date of Consult: 06/12/2012 Primary Physician: Fredirick Maudlin, MD Primary Cardiologist: [facility] cardiology Dr. Worton Bing  Chief Complaint: Cardiac arrest Reason for Consult: Cardiac arrest  HPI:  [name] [name] is a 56 y.o. woman with PMH of AFib on coumadin, CHF, HTN, DM, thyroid disease, COPD and alpha-1 anti-trypsin deficiency who presents to the Emergency Department complaining of abdominal pain, nausea, vomiting, diarrhea on admission day. She developed asystole and respiratory failure requiring intubation, treatment of hyperkalemia ( K 7.7), and initiation of code sepsis with IVF resuscitation, antibiotics, dopamine for hypotension, propofol for sedation. Cardiology is called for consultation.    Of note, she had been discharged from the hospital Friday 06/09/12 for an admission with CHF requiring intubation and prolonged stay in the ICU.    Past Medical History  Diagnosis Date  . Paroxysmal supraventricular tachycardia     Lost to followup-2006  . Atrial fibrillation     Echocardiogram in 2004-borderline LV function; no valvular abnormalities  . Right bundle branch block   . Anxiety   . Anemia     2011: Hemoglobin of 11.4; hematocrit of 33.5; normal MCV      Most Recent Cardiac Studies: None   Surgical History:  Past Surgical History  Procedure Date  . Breast excisional biopsy     Left x2     Home Meds: Prior to Admission medications   Medication Sig Start Date End Date Taking? Authorizing Provider  ALPRAZolam Prudy Feeler) 0.25 MG tablet Take 0.5 mg by mouth at bedtime as needed. For anxiety 01/31/12  Yes Historical Provider, MD  atenolol (TENORMIN) 50 MG tablet Take 25 mg by mouth 3 (three) times daily. 12/07/11  Yes Kathlen Brunswick, MD  cephALEXin (KEFLEX) 500 MG capsule Take 500 mg by mouth 4 (four) times daily.   Yes Historical Provider, MD    digoxin (LANOXIN) 0.125 MG tablet Take 0.25 mg by mouth daily. 09/03/11  Yes Kathlen Brunswick, MD  diltiazem (CARDIZEM CD) 180 MG 24 hr capsule Take 360 mg by mouth daily. 07/16/11  Yes Jodelle Gross, NP  furosemide (LASIX) 40 MG tablet Take 40 mg by mouth 2 (two) times daily.   Yes Historical Provider, MD  insulin glargine (LANTUS) 100 UNIT/ML injection Inject 5 Units into the skin at bedtime.   Yes Historical Provider, MD  pantoprazole (PROTONIX) 40 MG tablet Take 40 mg by mouth daily.   Yes Historical Provider, MD  potassium chloride SA (K-DUR,KLOR-CON) 20 MEQ tablet Take 40 mEq by mouth 3 (three) times daily.   Yes Historical Provider, MD  sertraline (ZOLOFT) 50 MG tablet Take 50 mg by mouth daily.   Yes Historical Provider, MD  SPIRIVA HANDIHALER 18 MCG inhalation capsule Place 18 mcg into inhaler and inhale daily.  01/03/12  Yes Historical Provider, MD  warfarin (COUMADIN) 5 MG tablet Take 5 mg by mouth daily at 6 PM.    Yes Historical Provider, MD  zolpidem (AMBIEN) 5 MG tablet Take 5 mg by mouth at bedtime as needed. For sleep 12/30/11  Yes Historical Provider, MD  aspirin 325 MG tablet Take 325 mg by mouth daily.      Historical Provider, MD    Inpatient Medications:    . [COMPLETED] sodium chloride   Intravenous Once  . [COMPLETED] calcium chloride  IV  1 g Intravenous Once  . [COMPLETED] dextrose  1 ampule Intravenous  Once  . dextrose      . [COMPLETED] diphenoxylate-atropine  2 tablet Oral Once  . DOPamine  5 mcg/kg/min Intravenous Once  . [COMPLETED] etomidate      . [COMPLETED] insulin aspart      . insulin regular  10 Units Intravenous Once  . lidocaine (cardiac) 100 mg/66ml      . [COMPLETED] LORazepam      . LORazepam      . LORazepam      . [COMPLETED] LORazepam      . [COMPLETED] LORazepam  2 mg Intravenous Once  . [COMPLETED] morphine  4 mg Intravenous Once  . ondansetron      . [COMPLETED] ondansetron  4 mg Intravenous Once  . [COMPLETED] ondansetron  4 mg  Intravenous Once  . pantoprazole      . [COMPLETED] pantoprazole  40 mg Intravenous Once  . [COMPLETED] piperacillin-tazobactam  3.375 g Intravenous Once  . [COMPLETED] propofol      . propofol  5-70 mcg/kg/min Intravenous Once  . rocuronium      . [COMPLETED] sodium chloride  1,000 mL Intravenous Once  . [COMPLETED] sodium chloride  1,000 mL Intravenous Once  . sodium chloride  10-40 mL Intracatheter Q12H  . [COMPLETED] succinylcholine      . [COMPLETED] vancomycin  1,000 mg Intravenous Once  . [DISCONTINUED] propofol  5-70 mcg/kg/min Intravenous Once    Allergies: No Known Allergies  History   Social History  . Marital Status: Divorced    Spouse Name: N/A    Number of Children: 2  . Years of Education: N/A   Occupational History  . 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 Smoking status: Never Smoker   . Smokeless tobacco: Never Used  . Alcohol Use: No  . Drug Use: No  . Sexually Active: Not on file   Other Topics Concern  . Not on file   Social History Narrative  . No narrative on file     Family History  Problem Relation Age of Onset  . Cancer Father     carcinoma of the lung  . Cancer Sister     carcinoma of the lung     Review of Systems: General: negative for chills, fever, night sweats or weight changes.  Cardiovascular: negative for chest pain, edema, orthopnea, palpitations, paroxysmal nocturnal dyspnea, shortness of breath or dyspnea on exertion Dermatological: negative for rash Respiratory: negative for cough or wheezing Urologic: negative for hematuria Abdominal: positive for nausea, abdominal pain, and diarrhea. Denies bright red blood per rectum, melena, or hematemesis Neurologic: negative for visual changes, syncope, or dizziness All other systems reviewed and are otherwise negative except as noted above.  Labs:  [facility] 06/12/12 0443 06/12/12 0226  CKTOTAL -- --  CKMB -- --  TROPONINI <0.30 <0.30   Lab  Results  Component Value Date   WBC 12.7* 06/12/2012   HGB 13.4 06/12/2012   HCT 40.2 06/12/2012   MCV 99.3 06/12/2012   PLT 199 06/12/2012    Lab 06/12/12 0443 06/12/12 0226  NA -- 132*  K 5.6* --  CL -- 93*  CO2 -- 21  BUN -- 50*  CREATININE -- 1.50*  CALCIUM -- 8.3*  PROT -- 6.0  BILITOT -- 0.4  ALKPHOS -- 99  ALT -- 66*  AST -- 44*  GLUCOSE -- 285*   No results found for this basename: CHOL, HDL, LDLCALC, TRIG   Lab Results  Component Value Date   DDIMER 1.16* 06/12/2012    Radiology/Studies:  Dg Chest Port 1 View  06/12/2012  *RADIOLOGY REPORT*  Clinical Data: Nausea, vomiting.  Post intubation.  PORTABLE CHEST - 1 VIEW  Comparison: 10/28/2004  Findings: Endotracheal tube is 4 cm above the carina.  There is cardiomegaly.  No confluent airspace opacities or effusions.  No acute bony abnormality.  IMPRESSION: Endotracheal tube 4 cm above the carina.  Cardiomegaly.   Original Report Authenticated By: Charlett Nose, M.D.     EKG: RBBB, LAD  Physical Exam: Blood pressure 126/55, pulse 105, temperature 98.8 F (37.1 C), temperature source Core (Comment), resp. rate 22, weight 114 lb (51.71 kg), SpO2 98.00%. General: sedated and intubated  Neck: Negative for carotid bruits. JVD not elevated. Lungs: Clear bilaterally to auscultation without wheezes, rales, or rhonchi.  Heart: irregularly irregular  No murmurs, rubs, or gallops appreciated. Abdomen: Soft, non-tender, non-distended.Hypoactive bowel sounds.  Extremities: No clubbing or cyanosis. No edema.  Distal pedal pulses are 2+ and equal bilaterally. Neuro: Unable to assess due to sedation and intubation     Assessment and Plan:  1. Cardiac arrest, Asystole              Asystole cardiac arrest in the setting of hyperkalemia of K 7.7 which likely associated with her admitting s/s of nausea, abdominal discomfort and diarrhea.  Of concern is possibility of pulmonary emboli with elevated d-dimer. INR is therapeutic but  she was immobile while previously hospitalized, did have both abdominal pain and leg pain.  Her Troponin are negative x 2 in ED with EKG. BNP elevated. NO signs of fluid overload.  - Off pressors with good blood pressures - Her Troponin are negative x 2 in ED with EKG - will add Digoxin level, TSH, HB A1C and Lipid panel. - 2D echo today  2. Acute respiratory failure, on Vent. PCCM manage  3. Sepsis, pre PCCM - WBC 12.7, Lactic acid 8.7, Blood cultures x 2 - IV Fentanyl and Propofol for sedation - IV Vanc, Zosyn and Flagyl  4. Acute renal failure and hyperkalemia, K 5.6 < 7.7, PCCM manage      5. Elevated transaminase level, likely associated with Cardiac arrest, will trend 6. Elevated D-Dimer    -Well score low probability.  Could related to her hospital visit, No right heart strains on EKG. Echo pending.     - May consider CTA chest once stabilized  7. Chronic A fib on coumadin, Therapeutic INR 2.15. Pharmacy dose coumadin.  8. HTN hold home meds   Code status: Full code VTE: Heparin SQ  Signed, LI, NA PGY-2 06/12/2012, 10:10 AM   I have personally seen and examined this patient with Dr. Ian Bushman. I agree with the assessment and plan as outlined above. In hospital cardiac arrest in setting of hyperkalemia. EKG does not suggest ischemia. Cardiac markers negative. Currently intubated. Repeat BMET pending. Continue supportive care. Echo today. She is known to have LvEF of 40% by echo at [facility]. If LVEF is still depressed, will need ischemic testing once stable from this event. Consider stress testing or cardiac cath. We will follow and make further plans as the hospitalization progresses.   Charmika Macdonnell 11:36 AM 06/12/2012

## 2012-06-12 NOTE — Procedures (Signed)
Central Venous Catheter Insertion Procedure Note KAMORAH NEVILS [id number] 08-13-1955  Procedure: Insertion of Central Venous Catheter Indications: Assessment of intravascular volume, Drug and/or fluid administration and blood pressure support.  Procedure Details Consent: Unable to obtain consent because of altered level of consciousness. Time Out: Verified patient identification, verified procedure, site/side was marked, verified correct patient position, special equipment/implants available, medications/allergies/relevent history reviewed, required imaging and test results available.  Performed  Maximum sterile technique was used including antiseptics, gloves, gown, hand hygiene, mask and sheet. Skin prep: Chlorhexidine; local anesthetic administered A antimicrobial bonded/coated triple lumen catheter was placed in the left femoral vein due to emergent situation using the Seldinger technique.  Evaluation Blood flow good Complications: No apparent complications Patient did tolerate procedure well.   Mathilde Mcwherter A 06/12/2012, 4:15 AM

## 2012-06-12 NOTE — ED Notes (Signed)
Placed heel protectors on patient's bilateral feet.

## 2012-06-12 NOTE — ED Notes (Signed)
Patient is in A-Fib with a rate between 98-128 with multifocal PVC.

## 2012-06-12 NOTE — ED Notes (Signed)
Patient lines patent, foley draining, ETT and OG tube secured well. Patient dry and clean at present.

## 2012-06-12 NOTE — ED Notes (Signed)
Repeat K+ called from lab.  Result 7.7   Dr. Colon Branch notified.

## 2012-06-12 NOTE — ED Notes (Signed)
Increased diprovan infusion to 63mcg/min. B/P - 136/85

## 2012-06-12 NOTE — H&P (Signed)
PULMONARY  / CRITICAL CARE MEDICINE  Name: [name] [name] MRN: [id number] DOB: 07-Aug-1955    LOS: 0  REFERRING MD :  [facility]  CHIEF COMPLAINT:  Post arrest   BRIEF PATIENT DESCRIPTION:  56 yo female with hx Afib, COPD (alpha-1), CHF, HTN, DM tx from [facility] 11/11 post arrest likely r/t hyperkalemia.  Followed commands post arrest therefor no hypothermia.   LINES / TUBES: ETT 11/11>>> L fem CVL 11/11>>>  CULTURES: BC x 2 11/11>>> Urine 11/11>>>  Sputum 11/11>>>  ANTIBIOTICS: Vanc 11/11>>> Zosyn 11/11>>> Oral Vanc 11/11>>> Flagyl 11/11>>>  SIGNIFICANT EVENTS:  2D echo 11/11>>>  LEVEL OF CARE:  ICU PRIMARY SERVICE:  PCCM CONSULTANTS:  cardiology CODE STATUS: full DIET:  NPO DVT Px:  heparin GI Px:  protonix  HISTORY OF PRESENT ILLNESS:  56 yo female with hx COPD (alpha-1), HTN, DM, Afib on coumadin, ?thyroid disease presented 11/11 to [facility] er with c/o abd pain, nausea, vomiting and diarrhea.  Just d/c 11/8 after an admission for decompensated CHF requiring intubation and prolonged ICU stay.  While being evaluated in ER 11/11 she became unresponsive, asystolic.  She was intubated and HR returned. Found to be hyperkalemic with K=7.7.  She cont to have issues with bradycardia and intermittent VT.  She followed commands post arrest and received insulin, D50 for hyperkalemia and tx to [facility].    PAST MEDICAL HISTORY :  Past Medical History  Diagnosis Date  . Paroxysmal supraventricular tachycardia     Lost to followup-2006  . Atrial fibrillation     Echocardiogram in 2004-borderline LV function; no valvular abnormalities  . Right bundle branch block   . Anxiety   . Anemia     2011: Hemoglobin of 11.4; hematocrit of 33.5; normal MCV   Past Surgical History  Procedure Date  . Breast excisional biopsy     Left x2   Prior to Admission medications   Medication Sig Start Date End Date Taking? Authorizing Provider  cephALEXin (KEFLEX) 500 MG capsule Take 500 mg  by mouth 4 (four) times daily.   Yes Historical Provider, MD  furosemide (LASIX) 40 MG tablet Take 40 mg by mouth 2 (two) times daily.   Yes Historical Provider, MD  insulin aspart (NOVOLOG) 100 UNIT/ML injection Inject 0-15 Units into the skin 3 (three) times daily before meals.   Yes Historical Provider, MD  insulin aspart (NOVOLOG) 100 UNIT/ML injection Inject 0-5 Units into the skin at bedtime.   Yes Historical Provider, MD  insulin glargine (LANTUS) 100 UNIT/ML injection Inject 5 Units into the skin at bedtime.   Yes Historical Provider, MD  pantoprazole (PROTONIX) 40 MG tablet Take 40 mg by mouth daily.   Yes Historical Provider, MD  potassium chloride SA (K-DUR,KLOR-CON) 20 MEQ tablet Take 40 mEq by mouth 3 (three) times daily.   Yes Historical Provider, MD  sertraline (ZOLOFT) 50 MG tablet Take 50 mg by mouth daily.   Yes Historical Provider, MD  warfarin (COUMADIN) 5 MG tablet Take 5 mg by mouth daily.   Yes Historical Provider, MD  ALPRAZolam Prudy Feeler) 0.25 MG tablet Take 0.5 mg by mouth as needed.  01/31/12   Historical Provider, MD  aspirin 325 MG tablet Take 325 mg by mouth daily.      Historical Provider, MD  atenolol (TENORMIN) 50 MG tablet Take 25 mg by mouth 3 (three) times daily. 12/07/11   Kathlen Brunswick, MD  digoxin (LANOXIN) 0.125 MG tablet Take 0.25 mg by  mouth daily. 09/03/11   Kathlen Brunswick, MD  diltiazem (CARDIZEM CD) 180 MG 24 hr capsule Take 360 mg by mouth daily. 07/16/11   Jodelle Gross, NP  SPIRIVA HANDIHALER 18 MCG inhalation capsule Place 18 mcg into inhaler and inhale daily.  01/03/12   Historical Provider, MD  zolpidem (AMBIEN) 5 MG tablet Take 5 mg by mouth at bedtime as needed.  12/30/11   Historical Provider, MD   No Known Allergies  FAMILY HISTORY:  Family History  Problem Relation Age of Onset  . Cancer Father     carcinoma of the lung  . Cancer Sister     carcinoma of the lung   SOCIAL HISTORY:  reports that she has never smoked. She has never used  smokeless tobacco. She reports that she does not drink alcohol or use illicit drugs.  REVIEW OF SYSTEMS:  Unable, pt sedated on vent, see HPI.   VITAL SIGNS: Temp:  [94.9 F (34.9 C)-98.8 F (37.1 C)] 98.8 F (37.1 C) (11/11 0745) Pulse Rate:  [49-105] 105  (11/11 0910) Resp:  [14-25] 22  (11/11 0910) BP: (79-149)/(54-99) 126/55 mmHg (11/11 0910) SpO2:  [93 %-100 %] 98 % (11/11 0910) FiO2 (%):  [100 %] 100 % (11/11 0910) Weight:  [114 lb (51.71 kg)] 114 lb (51.71 kg) (11/11 0155)    PHYSICAL EXAMINATION: General:  Chronically ill appearing female, NAD on vent  Neuro:  Sedated on vent, follows some commands, opens eyes to voice  HEENT:  Mm moist, no jvd Cardiovascular:  s1s2 irreg, Afib with PVC's Lungs:  resps even non labored on vent, few scattered crackles Abdomen:  Soft, non tender, +bs  Ext: warm and dry, no edema   INTAKE / OUTPUT: Intake/Output      11/10 0701 - 11/11 0700 11/11 0701 - 11/12 0700   Urine (mL/kg/hr) 1200 (1)    Total Output 1200    Net -1200          VENTILATOR SETTINGS: Vent Mode:  [-] PRVC FiO2 (%):  [100 %] 100 % Set Rate:  [14 bmp] 14 bmp Vt Set:  [500 mL] 500 mL PEEP:  [5 cmH20] 5 cmH20 Plateau Pressure:  [19 cmH20-34 cmH20] 19 cmH20  LABS:  Lab 06/12/12 0610 06/12/12 0443 06/12/12 0436 06/12/12 0300 06/12/12 0228 06/12/12 0226  HGB -- -- -- -- -- 13.4  WBC -- -- -- -- -- 12.7*  PLT -- -- 199 -- -- 205  NA -- -- -- -- -- 132*  K -- 5.6* -- 7.7* -- --  CL -- -- -- -- -- 93*  CO2 -- -- -- -- -- 21  GLUCOSE -- -- -- -- -- 285*  BUN -- -- -- -- -- 50*  CREATININE -- -- -- -- -- 1.50*  CALCIUM -- -- -- -- -- 8.3*  MG -- -- -- -- -- --  PHOS -- -- -- -- -- --  AST -- -- -- -- -- 44*  ALT -- -- -- -- -- 66*  ALKPHOS -- -- -- -- -- 99  BILITOT -- -- -- -- -- 0.4  PROT -- -- -- -- -- 6.0  ALBUMIN -- -- -- -- -- 2.8*  APTT -- -- 30 -- 31 --  INR -- -- 2.33* -- 2.15* --  LATICACIDVEN -- -- -- 8.7* -- --  TROPONINI -- <0.30 -- -- --  <0.30  PROCALCITON -- -- -- -- -- --  PROBNP -- -- -- -- -- 3356.0*  O2SATVEN -- -- -- -- -- --  PHART 7.370 -- -- -- -- --  PCO2ART 45.7* -- -- -- -- --  PO2ART 77.4* -- -- -- -- --   No results found for this basename: GLUCAP:5 in the last 168 hours  IMAGING: Dg Chest Port 1 View  06/12/2012  *RADIOLOGY REPORT*  Clinical Data: Nausea, vomiting.  Post intubation.  PORTABLE CHEST - 1 VIEW  Comparison: 10/28/2004  Findings: Endotracheal tube is 4 cm above the carina.  There is cardiomegaly.  No confluent airspace opacities or effusions.  No acute bony abnormality.  IMPRESSION: Endotracheal tube 4 cm above the carina.  Cardiomegaly.   Original Report Authenticated By: Charlett Nose, M.D.     ECG: Afib with PVC  DIAGNOSES: Active Problems:  * No active hospital problems. *    ASSESSMENT / PLAN:  PULMONARY  ASSESSMENT: Acute respiratory failure - post cardiac arrest presumably from hyperkalemia  ?HCAP ?aspiration - LLL collapse   PLAN:   Vent support, 8cc/kg.  F/u ABG. F/u CXR. Abx for HCAP coverage - see ID. SBT in AM.  CARDIOVASCULAR  ASSESSMENT:  Cardiac arrest - likely r/t hyperkalemia  Bradycardia Afib - on coumadin  PVC's  Diastolic CHF   PLAN:  2D echo  Trend cardiac enzymes  Cards to see  Hold coumadin for now  Consider heparin gtt - INR remains therapeutic at this time  F/u chem - see renal   RENAL  ASSESSMENT:   Hyperkalemia - Per family placed on K supplement after she was found to be hypokalemic r/t vomiting/diarrhea.  Renal insufficiency - mild   PLAN:   Stat bmet now  No further K supp  Hold further volume r/t CHF   GASTROINTESTINAL  ASSESSMENT:   Abd pain  Nausea/vomiting  Diarrhea -- ??CDiff   PLAN:   KUB  Empiric flagyl, po vanc NG tube to suction    HEMATOLOGIC  ASSESSMENT:   No acute issue   PLAN:  F/u cbc    INFECTIOUS  ASSESSMENT:   Leukocytosis  ?HCAP v aspiration   PLAN:   Empiric abx for HCAP, ?CDiff    Check pct  F/u lactate  Pan culture    ENDOCRINE  ASSESSMENT:   DM ?thyroid disease - on no supp     PLAN:   ICU hyperglycemia protocol  Check TSH, free t4    NEUROLOGIC  ASSESSMENT:   AMS - post arrest.  Does follow commands so no hypothermia   PLAN:   Supportive care  Cont propofol gtt  PRN fentanyl    WHITEHEART,KATHRYN, NP 06/12/2012  9:47 AM Pager: [phone] or [phone]  *Care during the described time interval was provided by me and/or other providers on the critical care team. I have reviewed this patient's available data, including medical history, events of note, physical examination and test results as part of my evaluation.  I have personally obtained a history, examined the patient, evaluated laboratory and imaging results, formulated the assessment and plan and placed orders.  CRITICAL CARE: The patient is critically ill with multiple organ systems failure and requires high complexity decision making for assessment and support, frequent evaluation and titration of therapies, application of advanced monitoring technologies and extensive interpretation of multiple databases. Critical Care Time devoted to patient care services described in this note is 90 minutes.   Alyson Reedy, M.D. [facility] Pulmonary/Critical Care Medicine. Pager: [phone]. After hours pager: [phone].

## 2012-06-12 NOTE — ED Notes (Signed)
Patient left ED at this time with [facility]. 

## 2012-06-12 NOTE — ED Notes (Signed)
Patient remains in soft leather restraints x 4 extremities. Palpable pulses in all 4 extremities. Patient remains agitated moving bilateral arms and legs.

## 2012-06-12 NOTE — Procedures (Signed)
Bronchoscopy Procedure Note [name] [name] [id number] Feb 13, 1956  Procedure: Bronchoscopy Indications: Obtain specimens for culture and/or other diagnostic studies  Procedure Details Consent: Risks of procedure as well as the alternatives and risks of each were explained to the (patient/caregiver).  Consent for procedure obtained. Time Out: Verified patient identification, verified procedure, site/side was marked, verified correct patient position, special equipment/implants available, medications/allergies/relevent history reviewed, required imaging and test results available.  Performed  In preparation for procedure, patient was given 100% FiO2 and bronchoscope lubricated. Sedation: Benzodiazepines  Airway entered and the following bronchi were examined: RUL, RML, RLL, LUL, LLL and Bronchi.   Procedures performed: Brushings performed Bronchoscope removed.    Evaluation Hemodynamic Status: BP stable throughout; O2 sats: stable throughout Patient's Current Condition: stable Specimens:  Sent serosanguinous fluid Complications: No apparent complications Patient did tolerate procedure well.  Evidence of aspiration, sample sent for culture.  Kolin Erdahl 06/12/2012

## 2012-06-12 NOTE — Progress Notes (Signed)
  Echocardiogram 2D Echocardiogram has been performed.  Verneda Hollopeter 06/12/2012, 1:08 PM

## 2012-06-12 NOTE — ED Notes (Signed)
[facility] states room change for patient. Attempted to call report to [phone]. Charge RN off floor. Unable to give report.

## 2012-06-12 NOTE — ED Notes (Signed)
[facility] at bedside for transport to [facility].

## 2012-06-12 NOTE — Progress Notes (Signed)
Patient placed on contact for r/o cdiff. Will inform family when they return to bedside.

## 2012-06-12 NOTE — Telephone Encounter (Signed)
Chart reviewed. Pt d/c from [facility] on 06/10/12. Noted in RN notes from [facility] that pt is en route to [facility], intubated and unresponsive. Will hold off on contacting pt at this time.

## 2012-06-12 NOTE — ED Notes (Signed)
Report given to [facility] RN. ETA 25 minutes to [facility]. Awaiting transport. Patient's family made aware.

## 2012-06-12 NOTE — Care Management Note (Addendum)
    Page 1 of 2   06/27/2012     4:05:25 PM   CARE MANAGEMENT NOTE 06/27/2012  Patient:  [name] [name], [name] [name]   Account Number:  [id number]  Date Initiated:  06/12/2012  Documentation initiated by:  Junius Creamer  Subjective/Objective Assessment:   adm w cardiac arrest     Action/Plan:   lives alone, pcp dr ed Juanetta Gosling   Anticipated DC Date:  06/22/2012   Anticipated DC Plan:  SKILLED NURSING FACILITY  In-house referral  Clinical Social Worker      DC Associate Professor  CM consult      [facility] Choice  HOME HEALTH   Choice offered to / List presented to:  C-1 Patient        HH arranged  HH-1 RN  HH-10 DISEASE MANAGEMENT  HH-2 PT  HH-3 OT  HH-4 NURSE'S AIDE      HH agency  [facility]   Status of service:  In process, will continue to follow Medicare Important Message given?   (If response is "NO", the following Medicare IM given date fields will be blank) Date Medicare IM given:   Date Additional Medicare IM given:    Discharge Disposition:  HOME W HOME HEALTH SERVICES  Per UR Regulation:  Reviewed for med. necessity/level of care/duration of stay  If discussed at Long Length of Stay Meetings, dates discussed:   06/27/2012    Comments:  samantha at [place] [facility] Kinder [phone] ext [phone] for dc planning needs  06-27-12 [address], [place] [phone] Per PT recommendations plan is for home with [facility] services. Pt chose [facility] for services. CM did place call to PA for orders. CM will make referral with [facility]. SOC to begin within 24-48 hours post d/c.    06/19/12 JULIE AMERSON,RN,BSN [phone] PT S/P CARDIAC ARREST ON 06/12/12.  PTA, PT LIVED ALONE AT HOME.  MET WITH PT AND SISTER AT BEDSIDE TO DISCUSS DC PLANS.  PT WILL NEED SNF AT DC, PER P.T. RECOMMENDATIONS AND PT IS AGREEABLE.  PT /FAMILY REQUESTING [facility] IN [place] AS FIRST CHOICE.  WILL CONSULT CSW TO FACILITATE DC TO SNF WHEN MEDICALLY STABLE FOR DC.  11/11 10:18a debbie dowell  rn,bsn [phone]

## 2012-06-12 NOTE — ED Notes (Signed)
Family at bedside. 

## 2012-06-12 NOTE — Progress Notes (Signed)
ANTIBIOTIC CONSULT NOTE - INITIAL  Pharmacy Consult for Vancomycin/Zosyn Indication: pneumonia  No Known Allergies  Patient Measurements: Height: 5' (152.4 cm) Weight: 114 lb (51.71 kg) IBW/kg (Calculated) : 45.5   Vital Signs: Temp: 99.3 F (37.4 C) (11/11 1100) Temp src: Core (Comment) (11/11 0507) BP: 93/79 mmHg (11/11 1100) Pulse Rate: 54  (11/11 1100) Intake/Output from previous day: 11/10 0701 - 11/11 0700 In: -  Out: 1200 [Urine:1200] Intake/Output from this shift: Total I/O In: 14.5 [I.V.:14.5] Out: -   Labs:  Basename 06/12/12 1012 06/12/12 0436 06/12/12 0226  WBC 12.0* -- 12.7*  HGB 12.6 -- 13.4  PLT 163 199 205  LABCREA -- -- --  CREATININE -- -- 1.50*   Estimated Creatinine Clearance: 29.7 ml/min (by C-G formula based on Cr of 1.5). No results found for this basename: VANCOTROUGH:2,VANCOPEAK:2,VANCORANDOM:2,GENTTROUGH:2,GENTPEAK:2,GENTRANDOM:2,TOBRATROUGH:2,TOBRAPEAK:2,TOBRARND:2,AMIKACINPEAK:2,AMIKACINTROU:2,AMIKACIN:2, in the last 72 hours   Microbiology: Recent Results (from the past 720 hour(s))  CULTURE, BLOOD (ROUTINE X 2)     Status: Normal (Preliminary result)   Collection Time   06/12/12  2:26 AM      Component Value Range Status Comment   Specimen Description BLOOD RIGHT ARM   Final    Special Requests BOTTLES DRAWN AEROBIC AND ANAEROBIC 6CC   Final    Culture NO GROWTH <24 HRS   Final    Report Status PENDING   Incomplete     Medical History: Past Medical History  Diagnosis Date  . Paroxysmal supraventricular tachycardia     Lost to followup-2006  . Atrial fibrillation     Echocardiogram in 2004-borderline LV function; no valvular abnormalities  . Right bundle branch block   . Anxiety   . Anemia     2011: Hemoglobin of 11.4; hematocrit of 33.5; normal MCV    Medications:  Scheduled:    . [COMPLETED] sodium chloride   Intravenous Once  . sodium chloride   Intravenous STAT  . [COMPLETED] calcium chloride  IV  1 g Intravenous  Once  . [COMPLETED] dextrose  1 ampule Intravenous Once  . dextrose      . [COMPLETED] diphenoxylate-atropine  2 tablet Oral Once  . DOPamine  5 mcg/kg/min Intravenous Once  . [COMPLETED] etomidate      . heparin  5,000 Units Subcutaneous Q8H  . [COMPLETED] insulin aspart      . insulin aspart  2-6 Units Subcutaneous Q4H  . lidocaine (cardiac) 100 mg/54ml      . [COMPLETED] LORazepam      . LORazepam      . LORazepam      . [COMPLETED] LORazepam      . [COMPLETED] LORazepam  2 mg Intravenous Once  . metronidazole  500 mg Intravenous Q8H  . [COMPLETED] morphine  4 mg Intravenous Once  . ondansetron      . [COMPLETED] ondansetron  4 mg Intravenous Once  . [COMPLETED] ondansetron  4 mg Intravenous Once  . pantoprazole      . [COMPLETED] pantoprazole  40 mg Intravenous Once  . pantoprazole (PROTONIX) IV  40 mg Intravenous QHS  . [COMPLETED] piperacillin-tazobactam  3.375 g Intravenous Once  . [COMPLETED] propofol      . rocuronium      . [COMPLETED] sodium chloride  1,000 mL Intravenous Once  . [COMPLETED] sodium chloride  1,000 mL Intravenous Once  . [COMPLETED] succinylcholine      . vancomycin  125 mg Oral Q6H  . [COMPLETED] vancomycin  1,000 mg Intravenous Once  . [DISCONTINUED] insulin regular  10 Units Intravenous Once  . [DISCONTINUED] propofol  5-70 mcg/kg/min Intravenous Once  . [DISCONTINUED] propofol  5-70 mcg/kg/min Intravenous Once  . [DISCONTINUED] sodium chloride  10-40 mL Intracatheter Q12H   Assessment: Pt is a 56 y/o F transfer from [facility] s/p arrest likely d/t hyperkalemia (7.7). Pt was just d/c from the hospital after a prolonged stay for decompensated HF and prolonged ICU stay. Pharmacy is consulted to started vancomycin and zosyn for possible HCAP. Pt is also on vanco PO/IV flagyl for possible C-Diff. WBC 12, Afebrile, Lactic Acid 8.7. Cultures ordered. Pt received vancomycin 1000mg  x 1 and zosyn 3.375g x 1 in the ED at 0400. Scr 1.50, CrCl ~25-30 mL/min.   Goal  of Therapy:  Vancomycin trough level 10-15 mcg/ml Eradication of infection  Plan:  - Vancomycin 500 mg IV q24h starting 11/12 at 0400 - Zosyn 3.375G IV q8h to be infused over 4 hours - f/u renal function, WBC, CXR - Antibiotic levels at steady state - f/u Micro data  Abran Duke, PharmD Clinical Pharmacist Phone: [phone] Pager: [phone] 06/12/2012 11:24 AM

## 2012-06-12 NOTE — ED Notes (Signed)
Increased dopamine infusion to 25mcg/min as ordered per MD.

## 2012-06-13 ENCOUNTER — Inpatient Hospital Stay (HOSPITAL_COMMUNITY): Payer: BC Managed Care – PPO

## 2012-06-13 DIAGNOSIS — I509 Heart failure, unspecified: Secondary | ICD-10-CM

## 2012-06-13 DIAGNOSIS — I5022 Chronic systolic (congestive) heart failure: Secondary | ICD-10-CM

## 2012-06-13 DIAGNOSIS — I4891 Unspecified atrial fibrillation: Secondary | ICD-10-CM

## 2012-06-13 LAB — CBC
Platelets: 174 10*3/uL (ref 150–400)
RDW: 15.2 % (ref 11.5–15.5)
WBC: 10.2 10*3/uL (ref 4.0–10.5)

## 2012-06-13 LAB — BLOOD GAS, ARTERIAL
Acid-Base Excess: 4.1 mmol/L — ABNORMAL HIGH (ref 0.0–2.0)
Drawn by: 33176
FIO2: 60 %
MECHVT: 500 mL
PEEP: 10 cmH2O
RATE: 14 resp/min
pCO2 arterial: 39.2 mmHg (ref 35.0–45.0)
pH, Arterial: 7.465 — ABNORMAL HIGH (ref 7.350–7.450)

## 2012-06-13 LAB — LIPID PANEL
Cholesterol: 105 mg/dL (ref 0–200)
LDL Cholesterol: 46 mg/dL (ref 0–99)
Total CHOL/HDL Ratio: 2.6 RATIO
VLDL: 18 mg/dL (ref 0–40)

## 2012-06-13 LAB — BASIC METABOLIC PANEL
Chloride: 99 mEq/L (ref 96–112)
Creatinine, Ser: 1.1 mg/dL (ref 0.50–1.10)
GFR calc Af Amer: 63 mL/min — ABNORMAL LOW (ref >90)
Potassium: 4 mEq/L (ref 3.5–5.1)
Sodium: 138 mEq/L (ref 135–145)

## 2012-06-13 LAB — MAGNESIUM: Magnesium: 1.7 mg/dL (ref 1.5–2.5)

## 2012-06-13 LAB — URINE CULTURE
Colony Count: NO GROWTH
Culture: NO GROWTH

## 2012-06-13 LAB — GLUCOSE, CAPILLARY
Glucose-Capillary: 86 mg/dL (ref 70–99)
Glucose-Capillary: 83 mg/dL (ref 70–99)

## 2012-06-13 LAB — PHOSPHORUS: Phosphorus: 3.8 mg/dL (ref 2.3–4.6)

## 2012-06-13 LAB — PROTIME-INR: INR: 2.01 — ABNORMAL HIGH (ref 0.00–1.49)

## 2012-06-13 MED ORDER — DEXTROSE 50 % IV SOLN
INTRAVENOUS | Status: AC
  Filled 2012-06-13: qty 50

## 2012-06-13 MED ORDER — JEVITY 1.2 CAL PO LIQD
1000.0000 mL | ORAL | Status: DC
  Administered 2012-06-13: 1000 mL
  Filled 2012-06-13 (×3): qty 1000

## 2012-06-13 MED ORDER — FUROSEMIDE 10 MG/ML IJ SOLN
INTRAMUSCULAR | Status: AC
  Filled 2012-06-13: qty 4

## 2012-06-13 MED ORDER — PRO-STAT SUGAR FREE PO LIQD
30.0000 mL | Freq: Three times a day (TID) | ORAL | Status: DC
  Administered 2012-06-13 (×2): 30 mL
  Filled 2012-06-13 (×5): qty 30

## 2012-06-13 MED ORDER — MAGNESIUM SULFATE 50 % IJ SOLN
2.0000 g | Freq: Once | INTRAVENOUS | Status: AC
  Administered 2012-06-13: 2 g via INTRAVENOUS
  Filled 2012-06-13: qty 4

## 2012-06-13 MED ORDER — FUROSEMIDE 10 MG/ML IJ SOLN
INTRAMUSCULAR | Status: AC
  Administered 2012-06-13: 20 mg via INTRAVENOUS
  Filled 2012-06-13: qty 4

## 2012-06-13 MED ORDER — DEXTROSE 50 % IV SOLN
25.0000 mL | Freq: Once | INTRAVENOUS | Status: AC | PRN
  Administered 2012-06-13: 25 mL via INTRAVENOUS

## 2012-06-13 MED ORDER — FUROSEMIDE 10 MG/ML IJ SOLN
20.0000 mg | Freq: Four times a day (QID) | INTRAMUSCULAR | Status: AC
  Administered 2012-06-13 – 2012-06-14 (×3): 20 mg via INTRAVENOUS

## 2012-06-13 MED ORDER — ADULT MULTIVITAMIN LIQUID CH
5.0000 mL | Freq: Every day | ORAL | Status: DC
  Administered 2012-06-13: 5 mL
  Filled 2012-06-13 (×4): qty 5

## 2012-06-13 MED FILL — Medication: Qty: 1 | Status: AC

## 2012-06-13 NOTE — Progress Notes (Signed)
Dr. Dierdre Searles notified of ?VT vs. SVT. Also notified of am labs. New order obtained. Will monitor.

## 2012-06-13 NOTE — Progress Notes (Signed)
Brief nutrition note:  RD consulted for initiation and management of TF. RD will implement as recommended in previous note:   INTERVENTION:  1. Recommend if pt to remains intubated, initiate Jevity 1.2 @ 20 ml/hr and increase by 10 ml q 4 hr to a goal rate of 35 ml/hr. Provide 30 ml Pro-stat TID. This EN regimen will provide 1308 kcal, 92 gm protein and 694 ml free water.  2. Will need additional free water flushes if no IVF.  3. Will require multivitamin supplement at this rate.  4. RD will continue to follow    Please see RD initial nutrition assessment for complete details.   Clarene Duke RD, LDN Pager [phone] After Hours pager [phone]

## 2012-06-13 NOTE — Progress Notes (Signed)
Pharmacist Heart Failure Core Measure Documentation  Assessment: [name] [name] has an EF documented as 25-30%  on 06/13/11 by ECHO.  Rationale: Heart failure patients with left ventricular systolic dysfunction (LVSD) and an EF < 40% should be prescribed an angiotensin converting enzyme inhibitor (ACEI) or angiotensin receptor blocker (ARB) at discharge unless a contraindication is documented in the medical record.  This patient is not currently on an ACEI or ARB for HF.  This note is being placed in the record in order to provide documentation that a contraindication to the use of these agents is present for this encounter.  ACE Inhibitor or Angiotensin Receptor Blocker is contraindicated (specify all that apply)  []   ACEI allergy AND ARB allergy []   Angioedema []   Moderate or severe aortic stenosis [x]   Hyperkalemia [x]   Hypotension []   Renal artery stenosis []   Worsening renal function, preexisting renal disease or dysfunction Pt is on levophed, has a cardiac arrest likely due to K of 7.7  JLedford, PharmD Pager [phone] 9:57 AM

## 2012-06-13 NOTE — Progress Notes (Signed)
Patient: [name] [name] Date of Encounter: 06/13/2012, 7:00 AM Admit date: 06/12/2012     Subjective  Patient is arousable on sedation of Versed and Fentanyl. Restrains off.  Levo started for MAP of 50's. Patient is noted to have 27 beats of tachyarrhythmia at 0550 this am--? SVT, which resolved spontaneously. Hemodynamically stable.    Objective  Physical Exam: Vitals: BP 120/63  Pulse 60  Temp 99.5 F (37.5 C) (Core (Comment))  Resp 19  Ht 5' (1.524 m)  Wt 117 lb 4.6 oz (53.2 kg)  BMI 22.91 kg/m2  SpO2 99% General: sedated and intubated, follow simple commands.  Neck: Negative for carotid bruits. JVD not elevated.  Lungs: Clear bilaterally to auscultation without wheezes, rales, or rhonchi.  Heart: irregularly irregular. No murmurs, rubs, or gallops appreciated.  Abdomen: NG tube,Soft, non-tender, non-distended.Hypoactive bowel sounds.  Extremities: No clubbing or cyanosis. No edema. Distal pedal pulses are 2+ and equal bilaterally.  Neuro: Unable to assess due to sedation and intubation    Intake/Output:  Intake/Output Summary (Last 24 hours) at 06/13/12 0700 Last data filed at 06/13/12 0600  Gross per 24 hour  Intake 1094.73 ml  Output   2340 ml  Net -1245.27 ml    Inpatient Medications:     . antiseptic oral rinse  15 mL Mouth Rinse QID  . chlorhexidine  15 mL Mouth Rinse BID  . [EXPIRED] dextrose      . heparin  5,000 Units Subcutaneous Q8H  . insulin aspart  2-6 Units Subcutaneous Q4H  . [EXPIRED] lidocaine (cardiac) 100 mg/105ml      . magnesium sulfate LVP 250-500 ml  2 g Intravenous Once  . metronidazole  500 mg Intravenous Q8H  . [COMPLETED] midazolam      . [EXPIRED] ondansetron      . pantoprazole      . pantoprazole (PROTONIX) IV  40 mg Intravenous QHS  . [COMPLETED] piperacillin-tazobactam  3.375 g Intravenous Once  . piperacillin-tazobactam (ZOSYN)  IV  3.375 g Intravenous Q8H  . [EXPIRED] rocuronium      . vancomycin  125 mg Oral Q6H  .  vancomycin  500 mg Intravenous Q24H  . [COMPLETED] sodium chloride   Intravenous STAT  . [DISCONTINUED] DOPamine  5 mcg/kg/min Intravenous Once  . [DISCONTINUED] insulin regular  10 Units Intravenous Once  . [DISCONTINUED] LORazepam      . [DISCONTINUED] LORazepam      . [DISCONTINUED] propofol  5-70 mcg/kg/min Intravenous Once  . [DISCONTINUED] sodium chloride  10-40 mL Intracatheter Q12H      . sodium chloride 15 mL/hr (06/12/12 1100)  . sodium chloride 5 mL/hr at 06/13/12 0600  . fentaNYL infusion INTRAVENOUS 50 mcg/hr (06/12/12 1412)  . midazolam (VERSED) infusion 3 mg/hr (06/12/12 1412)  . norepinephrine (LEVOPHED) Adult infusion 2 mcg/min (06/12/12 2031)  . [DISCONTINUED] propofol 35 mcg/kg/min (06/12/12 0900)    Labs:  Basename 06/13/12 0500 06/12/12 1012  NA 138 138  K 4.0 4.5  CL 99 97  CO2 29 31  GLUCOSE 82 87  BUN 28* 35*  CREATININE 1.10 1.08  CALCIUM 8.6 8.7  MG 1.7 1.5  PHOS 3.8 4.7*    Basename 06/12/12 1012 06/12/12 0226  AST 113* 44*  ALT 120* 66*  ALKPHOS 97 99  BILITOT 1.0 0.4  PROT 6.1 6.0  ALBUMIN 3.2* 2.8*    Basename 06/13/12 0500 06/12/12 1012 06/12/12 0226  WBC 10.2 12.0* --  NEUTROABS -- -- 8.5*  HGB 11.3* 12.6 --  HCT 32.7* 36.1 --  MCV 95.9 94.5 --  PLT 174 163 --    Basename 06/12/12 2110 06/12/12 0443 06/12/12 0226  CKTOTAL -- -- --  CKMB -- -- --  TROPONINI <0.30 <0.30 <0.30    Basename 06/12/12 0436  DDIMER 1.16*    Basename 06/12/12 1012  TSH 0.537  T4TOTAL --  T3FREE --  THYROIDAB --    Radiology/Studies: Dg Chest Port 1 View  06/13/2012  *RADIOLOGY REPORT*  Clinical Data: Ventilator.  PORTABLE CHEST - 1 VIEW  Comparison: 06/12/2012  Findings: Endotracheal tube and NG tube remain in place, unchanged. Left base atelectasis or infiltrate, improved since prior study. Right lung is clear.  Heart is borderline in size.  No acute bony abnormality.  IMPRESSION: Decreasing left base atelectasis or infiltrate.   Original  Report Authenticated By: Charlett Nose, M.D.    Portable Chest Xray To Verify Ett/ Central Line Placement  06/12/2012  *RADIOLOGY REPORT*  Clinical Data: ETT placement.  PORTABLE CHEST - 1 VIEW  Comparison: Chest x-ray 06/12/2012.  Findings: An endotracheal tube is in place with tip 4.3 cm above the carina. A nasogastric tube is seen extending into the stomach, however, the tip of the nasogastric tube extends below the lower margin of the image.  Compared to the prior examination there is now complete opacification of the base of the left hemithorax and slight shift of structures toward the left hemithorax, suggestive of complete left lower lobe atelectasis.  Lungs are otherwise clear.  No definite pleural effusions.  No evidence of pulmonary edema.  Mild cardiomegaly is unchanged.  Atherosclerosis of the thoracic aorta.  IMPRESSION: 1.  Support apparatus, as above. 2.  Interval development of complete left lower lobe atelectasis. Correlation for signs and symptoms of aspiration or mucous plugging is recommended.  These results were called by telephone on 06/12/2012 at 10:40 a.m. to Dr. Molli Knock, who verbally acknowledged these results.   Original Report Authenticated By: Trudie Reed, M.D.    Dg Chest Port 1 View  06/12/2012  *RADIOLOGY REPORT*  Clinical Data: Nausea, vomiting.  Post intubation.  PORTABLE CHEST - 1 VIEW  Comparison: 10/28/2004  Findings: Endotracheal tube is 4 cm above the carina.  There is cardiomegaly.  No confluent airspace opacities or effusions.  No acute bony abnormality.  IMPRESSION: Endotracheal tube 4 cm above the carina.  Cardiomegaly.   Original Report Authenticated By: Charlett Nose, M.D.    Dg Abd Portable 1v  06/12/2012  *RADIOLOGY REPORT*  Clinical Data: Abdominal pain  PORTABLE ABDOMEN - 1 VIEW  Comparison: None.  Findings: NG tube tip projects over the antrum of the stomach.  No disproportionate dilatation of small bowel.  Right femoral vascular catheter is in place.  No  obvious free intraperitoneal gas.  IMPRESSION: Nonobstructive bowel gas pattern.  NG tube.   Original Report Authenticated By: Jolaine Click, M.D.     Echocardiogram:06/12/12 LVEF 25-30%, diffuse hypokinesis. Moderate AR and MR  Telemetry: Afib   Assessment and Plan   1. Cardiac arrest, Asystole       Asystole cardiac arrest in the setting of hyperkalemia of K 7.7 which likely associated with her admitting s/s of nausea, abdominal discomfort and diarrhea.  Her Troponin are negative x 2 in ED with EKG. BNP elevated (actually trending down comparing to one week ago at 25, 000). NO signs of fluid overload. CVP 4  - follow commands so no hypothermia  - low dose Levo to keep MAP > 65 per PCCM  -  On Afib with controlled HR. 27 Beats ? SVT this am. K is ok and Mg 1.7--will replete 2g IV this am.  - Echo shows LVEF 25-30% >> Last LVEF 40% on 06/05/12     >>Consider cardiac cath ( No ischemic findings on her CE and EKG this admission)     >> consider to add Beta blocker, ACEI, statin, and may be spirolactone in near future     >> May need ICD.   2. Cardiomyopathy with recent hx of acute systolic CHF on 06/04/12  3. Acute respiratory failure, ? HCAP, ? Aspiration LLL collapse, on Vent. PCCM manage      Anticipating extubation soon  4. Sepsis, pre PCCM  -  Pancultures, C-diff pending - IV Fentanyl and versed for sedation  - IV Vanc, Zosyn and Flagyl  - PO Vanc for ? C-diff  5. Hypomagnesemia      Mg 1.7, repleted today  6. Acute renal failure and hyperkalemia, resolved,PCCM manage   7. Elevated transaminase level, likely associated with Cardiac arrest, will trend   8. Elevated D-Dimer, INR is therapeutic but she was immobile while previously hospitalized, did have both abdominal pain and leg pain.   -Well score low probability. Could related to her hospital visit, No right heart strains on EKG.  9. Chronic A fib on coumadin, Therapeutic INR. Pharmacy dose coumadin.   10. HTN hold home  meds   Code status: Full code   VTE: Heparin SQ  Signed, LI, NA PGY-2 7:19 AM   As above; patient seen and examined; alert on vent; no chest pain; presented with cardiac arrest most likely related to hyperkalemia; probable aspiration as well; plan continue vent wean and antibiotics; wean pressors as tolerated; once she is stable, plan cath given EF25-30; add ACEI and beta blocker later when BP allows. Hold coumadin in anticipation of cath. Patient with NSVT on telemetry; although cardiac arrest most likely from increased K, she will most likely need life vest at DC; repeat echo 3 months after meds titrated and if EF < 35, ICD. Olga Millers 8:53 AM

## 2012-06-13 NOTE — Progress Notes (Signed)
PULMONARY  / CRITICAL CARE MEDICINE  Name: [name] [name] MRN: [id number] DOB: Jun 01, 1956    LOS: 1  REFERRING MD :  [facility]  CHIEF COMPLAINT:  Post arrest   BRIEF PATIENT DESCRIPTION:  56 yo female with hx Afib, COPD (alpha-1), CHF, HTN, DM tx from [facility] 11/11 post arrest likely r/t hyperkalemia.  Followed commands post arrest therefor no hypothermia.   LINES / TUBES: ETT 11/11>>> L fem CVL 11/11>>>  CULTURES: BC x 2 11/11>>>NTD Urine 11/11>>>NTD Sputum 11/11>>>NTD  ANTIBIOTICS: Vanc 11/11>>> Zosyn 11/11>>> Oral Vanc 11/11>>> Flagyl 11/11>>>  SIGNIFICANT EVENTS:  2D echo 11/11>>>  LEVEL OF CARE:  ICU PRIMARY SERVICE:  PCCM CONSULTANTS:  cardiology CODE STATUS: full DIET:  NPO DVT Px:  heparin GI Px:  protonix  HISTORY OF PRESENT ILLNESS:  56 yo female with hx COPD (alpha-1), HTN, DM, Afib on coumadin, ?thyroid disease presented 11/11 to [facility] er with c/o abd pain, nausea, vomiting and diarrhea.  Just d/c 11/8 after an admission for decompensated CHF requiring intubation and prolonged ICU stay.  While being evaluated in ER 11/11 she became unresponsive, asystolic.  She was intubated and HR returned. Found to be hyperkalemic with K=7.7.  She cont to have issues with bradycardia and intermittent VT.  She followed commands post arrest and received insulin, D50 for hyperkalemia and tx to [facility].   VITAL SIGNS: Temp:  [99.5 F (37.5 C)-100 F (37.8 C)] 99.7 F (37.6 C) (11/12 1200) Pulse Rate:  [47-90] 85  (11/12 1222) Resp:  [12-25] 14  (11/12 1200) BP: (83-137)/(41-90) 133/59 mmHg (11/12 1222) SpO2:  [96 %-100 %] 97 % (11/12 1222) FiO2 (%):  [40 %-60 %] 40 % (11/12 1222) Weight:  [53.2 kg (117 lb 4.6 oz)] 53.2 kg (117 lb 4.6 oz) (11/12 0500) CVP:  [4 mmHg] 4 mmHg  PHYSICAL EXAMINATION: General:  Chronically ill appearing female, NAD on vent  Neuro:  Sedated on vent, follows some commands, opens eyes to voice  HEENT:  Mm moist, no jvd Cardiovascular:   s1s2 irreg, Afib with PVC's Lungs:  resps even non labored on vent, few scattered crackles Abdomen:  Soft, non tender, +bs  Ext: warm and dry, no edema   INTAKE / OUTPUT: Intake/Output      11/11 0701 - 11/12 0700 11/12 0701 - 11/13 0700   I.V. (mL/kg) 582.7 (11) 57.5 (1.1)   NG/GT 120 45   IV Piggyback 425 125   Total Intake(mL/kg) 1127.7 (21.2) 227.5 (4.3)   Urine (mL/kg/hr) 2340 (1.8) 490 (1.7)   Total Output 2340 490   Net -1212.3 -262.5         VENTILATOR SETTINGS: Vent Mode:  [-] PRVC FiO2 (%):  [40 %-60 %] 40 % Set Rate:  [12 bmp-14 bmp] 12 bmp Vt Set:  [500 mL] 500 mL PEEP:  [5 cmH20-10 cmH20] 5 cmH20 Plateau Pressure:  [18 cmH20-27 cmH20] 18 cmH20  LABS:  Lab 06/13/12 0848 06/13/12 0504 06/13/12 0500 06/12/12 2110 06/12/12 1100 06/12/12 1012 06/12/12 0610 06/12/12 0443 06/12/12 0436 06/12/12 0300 06/12/12 0228 06/12/12 0226  HGB -- -- 11.3* -- -- 12.6 -- -- -- -- -- 13.4  WBC -- -- 10.2 -- -- 12.0* -- -- -- -- -- 12.7*  PLT -- -- 174 -- -- 163 -- -- 199 -- -- --  NA -- -- 138 -- -- 138 -- -- -- -- -- 132*  K -- -- 4.0 -- -- 4.5 -- -- -- -- -- --  CL -- -- 99 -- -- 97 -- -- -- -- -- 93*  CO2 -- -- 29 -- -- 31 -- -- -- -- -- 21  GLUCOSE -- -- 82 -- -- 87 -- -- -- -- -- 285*  BUN -- -- 28* -- -- 35* -- -- -- -- -- 50*  CREATININE -- -- 1.10 -- -- 1.08 -- -- -- -- -- 1.50*  CALCIUM -- -- 8.6 -- -- 8.7 -- -- -- -- -- 8.3*  MG -- -- 1.7 -- -- 1.5 -- -- -- -- -- --  PHOS -- -- 3.8 -- -- 4.7* -- -- -- -- -- --  AST -- -- -- -- -- 113* -- -- -- -- -- 44*  ALT -- -- -- -- -- 120* -- -- -- -- -- 66*  ALKPHOS -- -- -- -- -- 97 -- -- -- -- -- 99  BILITOT -- -- -- -- -- 1.0 -- -- -- -- -- 0.4  PROT -- -- -- -- -- 6.1 -- -- -- -- -- 6.0  ALBUMIN -- -- -- -- -- 3.2* -- -- -- -- -- 2.8*  APTT -- -- -- -- -- -- -- -- 30 -- 31 --  INR 2.01* -- -- -- -- 2.13* -- -- 2.33* -- -- --  LATICACIDVEN -- -- -- -- -- 0.8 -- -- -- 8.7* -- --  TROPONINI -- -- -- <0.30 -- -- -- <0.30 -- --  -- <0.30  PROCALCITON -- -- -- -- -- 0.60 -- -- -- -- -- --  PROBNP -- -- -- 4270.0* -- -- -- -- -- -- -- 3356.0*  O2SATVEN -- -- -- -- -- -- -- -- -- -- -- --  PHART -- 7.465* -- -- 7.468* -- 7.370 -- -- -- -- --  PCO2ART -- 39.2 -- -- 40.7 -- 45.7* -- -- -- -- --  PO2ART -- 123.0* -- -- 184.0* -- 77.4* -- -- -- -- --   Lab 06/13/12 0837 06/13/12 0746 06/13/12 0359 06/12/12 2349 06/12/12 2023  GLUCAP 191* 69* 72 79 76   IMAGING: Dg Chest Port 1 View  06/13/2012  *RADIOLOGY REPORT*  Clinical Data: Ventilator.  PORTABLE CHEST - 1 VIEW  Comparison: 06/12/2012  Findings: Endotracheal tube and NG tube remain in place, unchanged. Left base atelectasis or infiltrate, improved since prior study. Right lung is clear.  Heart is borderline in size.  No acute bony abnormality.  IMPRESSION: Decreasing left base atelectasis or infiltrate.   Original Report Authenticated By: Charlett Nose, M.D.    Portable Chest Xray To Verify Ett/ Central Line Placement  06/12/2012  *RADIOLOGY REPORT*  Clinical Data: ETT placement.  PORTABLE CHEST - 1 VIEW  Comparison: Chest x-ray 06/12/2012.  Findings: An endotracheal tube is in place with tip 4.3 cm above the carina. A nasogastric tube is seen extending into the stomach, however, the tip of the nasogastric tube extends below the lower margin of the image.  Compared to the prior examination there is now complete opacification of the base of the left hemithorax and slight shift of structures toward the left hemithorax, suggestive of complete left lower lobe atelectasis.  Lungs are otherwise clear.  No definite pleural effusions.  No evidence of pulmonary edema.  Mild cardiomegaly is unchanged.  Atherosclerosis of the thoracic aorta.  IMPRESSION: 1.  Support apparatus, as above. 2.  Interval development of complete left lower lobe atelectasis. Correlation for signs and symptoms of aspiration or mucous plugging is recommended.  These results  were called by telephone on 06/12/2012  at 10:40 a.m. to Dr. Molli Knock, who verbally acknowledged these results.   Original Report Authenticated By: Trudie Reed, M.D.    Dg Chest Port 1 View  06/12/2012  *RADIOLOGY REPORT*  Clinical Data: Nausea, vomiting.  Post intubation.  PORTABLE CHEST - 1 VIEW  Comparison: 10/28/2004  Findings: Endotracheal tube is 4 cm above the carina.  There is cardiomegaly.  No confluent airspace opacities or effusions.  No acute bony abnormality.  IMPRESSION: Endotracheal tube 4 cm above the carina.  Cardiomegaly.   Original Report Authenticated By: Charlett Nose, M.D.    Dg Abd Portable 1v  06/12/2012  *RADIOLOGY REPORT*  Clinical Data: Abdominal pain  PORTABLE ABDOMEN - 1 VIEW  Comparison: None.  Findings: NG tube tip projects over the antrum of the stomach.  No disproportionate dilatation of small bowel.  Right femoral vascular catheter is in place.  No obvious free intraperitoneal gas.  IMPRESSION: Nonobstructive bowel gas pattern.  NG tube.   Original Report Authenticated By: Jolaine Click, M.D.     ECG: Afib with PVC  DIAGNOSES: Active Problems:  Cardiac arrest  Nonischemic cardiomyopathy  Chronic systolic CHF (congestive heart failure)  Acute respiratory failure  Sepsis  Hyperkalemia   ASSESSMENT / PLAN:  PULMONARY  ASSESSMENT: Acute respiratory failure - post cardiac arrest presumably from hyperkalemia  ?HCAP ?aspiration - LLL collapse   PLAN:   Decrease PEEP to 5 and FIO2 to 40%.  F/u ABG. F/u CXR. Abx for HCAP coverage - see ID. PS trials today and SBT in AM.  CARDIOVASCULAR  ASSESSMENT:  Cardiac arrest - likely r/t hyperkalemia  Bradycardia Afib - on coumadin  PVC's  Diastolic CHF   PLAN:  2D echo pending. Ccardiac enzymes negative. Cards input appreciated. Hold coumadin for now, once more stable (in AM would imagine) will start heparin. Consider heparin gtt - INR remains therapeutic at this time. F/u chem - see renal.  RENAL  ASSESSMENT:   Hyperkalemia - Per  family placed on K supplement after she was found to be hypokalemic r/t vomiting/diarrhea.  Renal insufficiency - mild   PLAN:   K down to 4. No further K supp  Once BP is more stable will begin diuresing prior to serious consideration for extubation.  GASTROINTESTINAL  ASSESSMENT:   Abd pain  Nausea/vomiting  Diarrhea -- ??CDiff   PLAN:   KUB  Empiric flagyl, po vanc. Start nutrition via TF, will need nutrition input after extubation given severe malnutrition.  HEMATOLOGIC  ASSESSMENT:   No acute issue   PLAN:  F/u cbc    INFECTIOUS  ASSESSMENT:   Leukocytosis  ?HCAP v aspiration   PLAN:   Empiric abx for HCAP, ?CDiff  Pan culture  Will narrow once cultures are present.  ENDOCRINE  ASSESSMENT:   DM ?thyroid disease - on no supp     PLAN:   ICU hyperglycemia protocol  Check TSH, free t4   NEUROLOGIC  ASSESSMENT:   AMS - post arrest.  Does follow commands so no hypothermia   PLAN:   Supportive care  PRN versed. PRN fentanyl   Family updated at length, the patient is clearly very emaciated, her over all health is very poor and I doubt she will survive too many more bouts of sepsis and respiratory failure.  I stressed the importance of rehab and diet to the family and treatment of her underlying depression as that seems to be the consensus as the reason for most of  her current issues.  CC time 35 min.  Alyson Reedy, M.D. [facility] Pulmonary/Critical Care Medicine. Pager: [phone]. After hours pager: [phone].

## 2012-06-13 NOTE — Progress Notes (Signed)
Pt had 27 beat run VT. Pt asymptomatic. VSS. Jonette Pesa PA paged. AM labs pending. Will monitor.

## 2012-06-13 NOTE — Progress Notes (Signed)
Restraints removed. Pt on sedation and follow simple commands.

## 2012-06-13 NOTE — Progress Notes (Signed)
INITIAL ADULT NUTRITION ASSESSMENT Date: 06/13/2012   Time: 10:48 AM Reason for Assessment: vent   INTERVENTION: 1. Recommend if pt to remains intubated, initiate Jevity 1.2 @ 20 ml/hr and increase by 10 ml q 4 hr to a goal rate of 35 ml/hr. Provide 30 ml Pro-stat TID. This EN regimen will provide 1308 kcal, 92 gm protein and 694 ml free water.  2. Will need additional free water flushes if no IVF.  3. Will require multivitamin supplement at this rate.  4. RD will continue to follow     DOCUMENTATION CODES Per approved criteria  -Not Applicable    ASSESSMENT: Female 56 y.o.  Dx: Cardiac arrest  Hx:  Past Medical History  Diagnosis Date  . Paroxysmal supraventricular tachycardia     Lost to followup-2006  . Atrial fibrillation     Echocardiogram in 2004-borderline LV function; no valvular abnormalities  . Right bundle branch block   . Anxiety   . Anemia     2011: Hemoglobin of 11.4; hematocrit of 33.5; normal MCV    Past Surgical History  Procedure Date  . Breast excisional biopsy     Left x2     Related Meds:     . antiseptic oral rinse  15 mL Mouth Rinse QID  . chlorhexidine  15 mL Mouth Rinse BID  . [EXPIRED] dextrose      . [COMPLETED] dextrose      . insulin aspart  2-6 Units Subcutaneous Q4H  . [EXPIRED] lidocaine (cardiac) 100 mg/27ml      . magnesium sulfate LVP 250-500 ml  2 g Intravenous Once  . metronidazole  500 mg Intravenous Q8H  . [COMPLETED] midazolam      . [EXPIRED] ondansetron      . pantoprazole      . pantoprazole (PROTONIX) IV  40 mg Intravenous QHS  . piperacillin-tazobactam (ZOSYN)  IV  3.375 g Intravenous Q8H  . [EXPIRED] rocuronium      . vancomycin  125 mg Oral Q6H  . vancomycin  500 mg Intravenous Q24H  . [COMPLETED] sodium chloride   Intravenous STAT  . [DISCONTINUED] DOPamine  5 mcg/kg/min Intravenous Once  . [DISCONTINUED] heparin  5,000 Units Subcutaneous Q8H  . [DISCONTINUED] LORazepam      . [DISCONTINUED] LORazepam           Ht: 5' (152.4 cm)  Wt: 117 lb 4.6 oz (53.2 kg)  Ideal Wt: 45.4 kg  % Ideal Wt: 117%  Usual Wt:  Wt Readings from Last 5 Encounters:  06/13/12 117 lb 4.6 oz (53.2 kg)  02/15/12 114 lb (51.71 kg)  08/06/11 118 lb (53.524 kg)  12/07/10 116 lb (52.617 kg)  admission weight 114 lbs.   % Usual Wt: ~100%   Body mass index is 22.91 kg/(m^2). Pt is WNL per current BMI   Food/Nutrition Related Hx: Indicated weight loss of unknown amount PTA per MST (Malnutrition Screening Tool)   Labs:  CMP     Component Value Date/Time   NA 138 06/13/2012 0500   K 4.0 06/13/2012 0500   CL 99 06/13/2012 0500   CO2 29 06/13/2012 0500   GLUCOSE 82 06/13/2012 0500   BUN 28* 06/13/2012 0500   CREATININE 1.10 06/13/2012 0500   CALCIUM 8.6 06/13/2012 0500   PROT 6.1 06/12/2012 1012   ALBUMIN 3.2* 06/12/2012 1012   AST 113* 06/12/2012 1012   ALT 120* 06/12/2012 1012   ALKPHOS 97 06/12/2012 1012   BILITOT 1.0 06/12/2012 1012  GFRNONAA 55* 06/13/2012 0500   GFRAA 63* 06/13/2012 0500       Intake/Output Summary (Last 24 hours) at 06/13/12 1051 Last data filed at 06/13/12 1000  Gross per 24 hour  Intake 1139.83 ml  Output   1905 ml  Net -765.17 ml     Diet Order: NPO  Supplements/Tube Feeding: none   IVF:    sodium chloride Last Rate: 15 mL/hr (06/12/12 1100)  sodium chloride Last Rate: 5 mL/hr at 06/13/12 0600  fentaNYL infusion INTRAVENOUS Last Rate: 50 mcg/hr (06/12/12 1412)  midazolam (VERSED) infusion Last Rate: 3 mg/hr (06/12/12 1412)  norepinephrine (LEVOPHED) Adult infusion Last Rate: 2 mcg/min (06/12/12 2031)  [DISCONTINUED] propofol Last Rate: 35 mcg/kg/min (06/12/12 0900)   Patient is currently intubated on ventilator support.  MV: 7 Temp:Temp (24hrs), Avg:99.8 F (37.7 C), Min:99.3 F (37.4 C), Max:100 F (37.8 C)  Propofol: d/c'd   Estimated Nutritional Needs:   Kcal: 1314 Protein: 80-95 gm  Fluid:  1.3-1.5 L   Pt was just d/c on 11/8. Went to [facility] on  11/11 for abdominal pain, N/V/D. Became unresponsive in ED and required intubation. Found to have K=7.7. Pt was responsive post arrest so hypothermia protocol was not started. Remains intubated at this time.  Indicated that pt had lost weight recently per MST (Malnutrition Screening Tool), weight hx shows admission weight to be stable with weight from several months ago. Weight is appropriate for height and age.   Recommend initiation of EN if expected to remain intubated.   NUTRITION DIAGNOSIS: Inadequate oral intake r/t inability to eat AEB NPO.  EDUCATION NEEDS: -No education needs identified at this time   Clarene Duke RD, LDN Pager [phone] After Hours pager [phone]  06/13/2012, 10:48 AM

## 2012-06-14 ENCOUNTER — Inpatient Hospital Stay (HOSPITAL_COMMUNITY): Payer: BC Managed Care – PPO

## 2012-06-14 DIAGNOSIS — I428 Other cardiomyopathies: Secondary | ICD-10-CM

## 2012-06-14 DIAGNOSIS — I4510 Unspecified right bundle-branch block: Secondary | ICD-10-CM

## 2012-06-14 LAB — BASIC METABOLIC PANEL
BUN: 24 mg/dL — ABNORMAL HIGH (ref 6–23)
CO2: 32 mEq/L (ref 19–32)
Calcium: 8.3 mg/dL — ABNORMAL LOW (ref 8.4–10.5)
Chloride: 94 mEq/L — ABNORMAL LOW (ref 96–112)
Creatinine, Ser: 0.93 mg/dL (ref 0.50–1.10)
GFR calc Af Amer: 78 mL/min — ABNORMAL LOW (ref >90)
GFR calc non Af Amer: 67 mL/min — ABNORMAL LOW (ref >90)
Glucose, Bld: 128 mg/dL — ABNORMAL HIGH (ref 70–99)
Potassium: 3.3 mEq/L — ABNORMAL LOW (ref 3.5–5.1)
Sodium: 137 mEq/L (ref 135–145)

## 2012-06-14 LAB — CULTURE, BAL-QUANTITATIVE W GRAM STAIN: Colony Count: NO GROWTH

## 2012-06-14 LAB — CULTURE, RESPIRATORY W GRAM STAIN: Gram Stain: NONE SEEN

## 2012-06-14 LAB — CBC
HCT: 34 % — ABNORMAL LOW (ref 36.0–46.0)
Hemoglobin: 11.6 g/dL — ABNORMAL LOW (ref 12.0–15.0)
MCH: 32.6 pg (ref 26.0–34.0)
MCHC: 34.1 g/dL (ref 30.0–36.0)
MCV: 95.5 fL (ref 78.0–100.0)
Platelets: 159 10*3/uL (ref 150–400)
RBC: 3.56 MIL/uL — ABNORMAL LOW (ref 3.87–5.11)
RDW: 14.8 % (ref 11.5–15.5)
WBC: 8.4 10*3/uL (ref 4.0–10.5)

## 2012-06-14 LAB — GLUCOSE, CAPILLARY
Glucose-Capillary: 130 mg/dL — ABNORMAL HIGH (ref 70–99)
Glucose-Capillary: 109 mg/dL — ABNORMAL HIGH (ref 70–99)
Glucose-Capillary: 158 mg/dL — ABNORMAL HIGH (ref 70–99)
Glucose-Capillary: 100 mg/dL — ABNORMAL HIGH (ref 70–99)
Glucose-Capillary: 100 mg/dL — ABNORMAL HIGH (ref 70–99)
Glucose-Capillary: 93 mg/dL (ref 70–99)

## 2012-06-14 LAB — HEPATIC FUNCTION PANEL
ALT: 59 U/L — ABNORMAL HIGH (ref 0–35)
Alkaline Phosphatase: 78 U/L (ref 39–117)
Bilirubin, Direct: 0.4 mg/dL — ABNORMAL HIGH (ref 0.0–0.3)
Indirect Bilirubin: 0.8 mg/dL (ref 0.3–0.9)

## 2012-06-14 LAB — BLOOD GAS, ARTERIAL
Bicarbonate: 32.2 mEq/L — ABNORMAL HIGH (ref 20.0–24.0)
PEEP: 5 cmH2O
Patient temperature: 98.6
pCO2 arterial: 40.9 mmHg (ref 35.0–45.0)
pH, Arterial: 7.507 — ABNORMAL HIGH (ref 7.350–7.450)
pO2, Arterial: 78.3 mmHg — ABNORMAL LOW (ref 80.0–100.0)

## 2012-06-14 LAB — MAGNESIUM: Magnesium: 2.1 mg/dL (ref 1.5–2.5)

## 2012-06-14 LAB — PROTIME-INR: Prothrombin Time: 18.5 seconds — ABNORMAL HIGH (ref 11.6–15.2)

## 2012-06-14 MED ORDER — CARVEDILOL 3.125 MG PO TABS: 3.1250 mg | ORAL_TABLET | Freq: Two times a day (BID) | ORAL | Status: DC

## 2012-06-14 MED ORDER — POTASSIUM CHLORIDE CRYS ER 20 MEQ PO TBCR
40.0000 meq | EXTENDED_RELEASE_TABLET | Freq: Once | ORAL | Status: AC
  Administered 2012-06-14: 40 meq via ORAL
  Filled 2012-06-14: qty 2

## 2012-06-14 MED ORDER — SODIUM CHLORIDE 0.9 % IJ SOLN: 3.0000 mL | Freq: Two times a day (BID) | INTRAMUSCULAR | Status: DC

## 2012-06-14 MED ORDER — HEPARIN (PORCINE) IN NACL 100-0.45 UNIT/ML-% IJ SOLN
700.0000 [IU]/h | INTRAMUSCULAR | Status: DC
  Administered 2012-06-14: 600 [IU]/h via INTRAVENOUS
  Administered 2012-06-14: 750 [IU]/h via INTRAVENOUS
  Filled 2012-06-14 (×2): qty 250

## 2012-06-14 MED ORDER — POTASSIUM CHLORIDE 20 MEQ/15ML (10%) PO LIQD
40.0000 meq | Freq: Once | ORAL | Status: AC
  Administered 2012-06-14: 40 meq
  Filled 2012-06-14: qty 30

## 2012-06-14 MED ORDER — PANTOPRAZOLE SODIUM 40 MG PO TBEC
40.0000 mg | DELAYED_RELEASE_TABLET | Freq: Every day | ORAL | Status: DC
  Administered 2012-06-14 – 2012-06-28 (×15): 40 mg via ORAL
  Filled 2012-06-14 (×14): qty 1

## 2012-06-14 MED ORDER — SODIUM CHLORIDE 0.9 % IV SOLN
INTRAVENOUS | Status: DC
  Administered 2012-06-15: 08:00:00 via INTRAVENOUS

## 2012-06-14 MED ORDER — FENTANYL CITRATE 0.05 MG/ML IJ SOLN
50.0000 ug | INTRAMUSCULAR | Status: DC | PRN
  Administered 2012-06-14: 100 ug via INTRAVENOUS
  Administered 2012-06-14: 50 ug via INTRAVENOUS
  Filled 2012-06-14 (×2): qty 2

## 2012-06-14 MED ORDER — FUROSEMIDE 10 MG/ML IJ SOLN
INTRAMUSCULAR | Status: AC
  Administered 2012-06-14: 20 mg via INTRAVENOUS
  Filled 2012-06-14: qty 4

## 2012-06-14 MED ORDER — SODIUM CHLORIDE 0.9 % IV SOLN: 250.0000 mL | INTRAVENOUS | Status: DC | PRN

## 2012-06-14 MED ORDER — MIDAZOLAM HCL 2 MG/2ML IJ SOLN: 2.0000 mg | INTRAMUSCULAR | Status: DC | PRN

## 2012-06-14 MED ORDER — HEPARIN BOLUS VIA INFUSION
1350.0000 [IU] | Freq: Once | INTRAVENOUS | Status: AC
  Administered 2012-06-14: 1350 [IU] via INTRAVENOUS
  Filled 2012-06-14: qty 1350

## 2012-06-14 MED ORDER — SODIUM CHLORIDE 0.9 % IJ SOLN: 3.0000 mL | INTRAMUSCULAR | Status: DC | PRN

## 2012-06-14 MED ORDER — CARVEDILOL 3.125 MG PO TABS
3.1250 mg | ORAL_TABLET | Freq: Two times a day (BID) | ORAL | Status: DC
  Administered 2012-06-14 (×2): 3.125 mg via ORAL
  Filled 2012-06-14 (×4): qty 1

## 2012-06-14 NOTE — Discharge Summary (Signed)
Physician Discharge Summary  Patient ID: [name] [name] MRN: [id number] DOB/AGE: 57-Jul-1957 57 y.o. Primary Care Physician:Danell Verno L, MD Admit date: 06/04/2012 Discharge date: 06/14/2012    Discharge Diagnoses:   Principal Problem:  *CHF (congestive heart failure) Active Problems:  Atrial fibrillation with rapid ventricular response  COPD exacerbation  Anxiety  Depression  Hypokalemia  Diabetes mellitus due to underlying condition  Anemia     Medication List     As of 06/14/2012  8:43 AM    STOP taking these medications         aspirin 325 MG EC tablet      TAKE these medications         ALPRAZolam 0.5 MG tablet   Commonly known as: XANAX   Take 0.5 mg by mouth daily as needed. Nerves/anxiety      atenolol 25 MG tablet   Commonly known as: TENORMIN   Take 1 tablet (25 mg total) by mouth 3 (three) times daily.      cephALEXin 500 MG capsule   Commonly known as: KEFLEX   Take 1 capsule (500 mg total) by mouth 4 (four) times daily.      digoxin 0.25 MG tablet   Commonly known as: LANOXIN   Take 250 mcg by mouth every morning.      diltiazem 360 MG 24 hr capsule   Commonly known as: CARDIZEM CD   Take 1 capsule (360 mg total) by mouth daily.      furosemide 40 MG tablet   Commonly known as: LASIX   Take 1 tablet (40 mg total) by mouth 2 (two) times daily.      insulin aspart 100 UNIT/ML injection   Commonly known as: novoLOG   Inject 0-15 Units into the skin 3 (three) times daily with meals.      insulin aspart 100 UNIT/ML injection   Commonly known as: novoLOG   Inject 0-5 Units into the skin at bedtime.      insulin glargine 100 UNIT/ML injection   Commonly known as: LANTUS   Inject 5 Units into the skin at bedtime.      pantoprazole 40 MG tablet   Commonly known as: PROTONIX   Take 1 tablet (40 mg total) by mouth daily.      potassium chloride SA 20 MEQ tablet   Commonly known as: K-DUR,KLOR-CON   Take 2 tablets (40 mEq total) by mouth  3 (three) times daily.      sertraline 50 MG tablet   Commonly known as: ZOLOFT   Take 50 mg by mouth daily.      tiotropium 18 MCG inhalation capsule   Commonly known as: SPIRIVA   Place 18 mcg into inhaler and inhale daily.      warfarin 5 MG tablet   Commonly known as: COUMADIN   Take 1 tablet (5 mg total) by mouth daily.        Discharged Condition: Improved    Consults: Cardiology  Significant Diagnostic Studies: Dg Chest Port 1 View  06/07/2012  *RADIOLOGY REPORT*  Clinical Data: CHF  PORTABLE CHEST - 1 VIEW  Comparison: Portable exam 1537 hours compared to 06/04/2012  Findings: Marked enlargement of cardiac silhouette. Decreased pulmonary vascular congestion. Resolution of pulmonary edema seen on previous exam. Tiny residual left pleural effusion. Lungs otherwise clear. No pneumothorax. Bones demineralized.  IMPRESSION: Resolved CHF.   Original Report Authenticated By: Ulyses Southward, M.D.    Dg Chest Port 1 View  06/04/2012  *RADIOLOGY REPORT*  Clinical Data: Cough.  Shortness of breath.  PORTABLE CHEST - 1 VIEW 06/04/2012 1958 hours:  Comparison: None.  Findings: Cardiac silhouette moderately to markedly enlarged. Diffuse interstitial and airspace pulmonary edema.  Bilateral pleural effusions and associated passive atelectasis in the lower lobes.  IMPRESSION: CHF, with moderate diffuse interstitial and airspace pulmonary edema.  Bilateral pleural effusions with associated passive atelectasis in the lower lobes.   Original Report Authenticated By: Hulan Saas, M.D.     Lab Results: Basic Metabolic Panel: No results found for this basename: NA:2,K:2,CL:2,CO2:2,GLUCOSE:2,BUN:2,CREATININE:2,CALCIUM:2,MG:2,PHOS:2 in the last 72 hours Liver Function Tests: No results found for this basename: AST:2,ALT:2,ALKPHOS:2,BILITOT:2,PROT:2,ALBUMIN:2 in the last 72 hours   CBC: No results found for this basename: WBC:2,NEUTROABS:2,HGB:2,HCT:2,MCV:2,PLT:2 in the last 72 hours  Recent  Results (from the past 240 hour(s))  URINE CULTURE     Status: Normal   Collection Time   06/04/12  9:30 PM      Component Value Range Status Comment   Specimen Description URINE, CATHETERIZED   Final    Special Requests NONE   Final    Culture  Setup Time 06/04/2012 23:20   Final    Colony Count NO GROWTH   Final    Culture NO GROWTH   Final    Report Status 06/06/2012 FINAL   Final   MRSA PCR SCREENING     Status: Normal   Collection Time   06/04/12 10:49 PM      Component Value Range Status Comment   MRSA by PCR NEGATIVE  NEGATIVE Final      Hospital Course: She was admitted with the new onset of atrial fibrillation and congestive heart failure. It was felt that the atrial fibrillation may have caused her to develop congestive heart failure but her echocardiogram did show some decrease in ejection fraction. She was also found to have new onset of diabetes. Her potassium was very low and required large amounts of potassium to replace it partially because she was being diuresed. She was admitted to the intensive care unit placed on diltiazem drip and had difficulty getting her rate controlled. She was given intravenous diuretics. 2 days prior to discharge her chest x-ray had totally cleared. She was instructed about congestive heart failure and about diabetes. She was able to ambulate in the hall. She was not dyspneic  Discharge Exam: Blood pressure 115/66, pulse 70, temperature 97.6 F (36.4 C), temperature source Oral, resp. rate 18, height 5\' 5"  (1.651 m), weight 56.8 kg (125 lb 3.5 oz), SpO2 98.00%. She is awake and alert. She is very comfortable. Her chest is clear now. She is still in atrial fibrillation. Her heart did not show a gallop. Her abdomen was soft. She had no edema of the extremities but did not have edema of the extremities even on admission  Disposition: Home with home health services. She will need extensive support. She is going to be with her sister which I think is  appropriate. She will have laboratory work in 3 days because she's on Coumadin and because she's on high-dose potassium and diuretics. She will followup in my office and with the cardiologist.      Discharge Orders    Future Appointments: Provider: Department: Dept Phone: Center:   06/21/2012 1:20 PM Jodelle Gross, NP [facility] [place] [phone] [facility]     Future Orders Please Complete By Expires   Home Health      Questions: Responses:   To provide the following care/treatments PT  RN   Face-to-face encounter      Comments:   I Renaye Janicki L certify that this patient is under my care and that I, or a nurse practitioner or physician's assistant working with me, had a face-to-face encounter that meets the physician face-to-face encounter requirements with this patient on 06/09/2012.   Questions: Responses:   The encounter with the patient was in whole, or in part, for the following medical condition, which is the primary reason for home health care chf   I certify that, based on my findings, the following services are medically necessary home health services Nursing    Physical therapy   My clinical findings support the need for the above services Acute exacerbation of CHF   Further, I certify that my clinical findings support that this patient is homebound due to: Shortness of Breath with activity   To provide the following care/treatments PT    RN   Discharge patient      Comments:   She needs a prothrombin time done on 11/11 and this should be called to the [facility]. She needs be met on 06/12/2012 also      Follow-up Information    Follow up with Joni Reining, NP. On 06/21/2012. (1:20 p)    Contact information:   [address] [place], [place] [phone]       Follow up with [facility]. On 06/12/2012. (10am)       Follow up with [facility]. (RN, call with your location)    Contact information:    [address] [place] [address] [phone]         Signed: Tommy Rainwater [phone]  06/14/2012, 8:43 AM

## 2012-06-14 NOTE — Progress Notes (Signed)
Patient: [name] [name] Date of Encounter: 06/14/2012, 7:09 AM Admit date: 06/12/2012     Subjective  Patient is arousable. Off all sedation meds and pressors. Still have multifocal PVCs.    Objective  Physical Exam: Vitals: BP 114/65  Pulse 86  Temp 99.1 F (37.3 C) (Oral)  Resp 14  Ht 5' (1.524 m)  Wt 117 lb 4.6 oz (53.2 kg)  BMI 22.91 kg/m2  SpO2 97% General: sedated and intubated, follow simple commands. Opens eyes to voice. Neck: Negative for carotid bruits. JVD not elevated.  Lungs: Clear bilaterally to auscultation without wheezes, rales, or rhonchi.  Heart: irregularly irregular. A Fib with PVCs. No murmurs, rubs, or gallops appreciated.  Abdomen: NG tube,Soft, non-tender, non-distended.Hypoactive bowel sounds.  Extremities: No clubbing or cyanosis. No edema. Distal pedal pulses are 2+ and equal bilaterally.  Neuro:    Intake/Output:  Intake/Output Summary (Last 24 hours) at 06/14/12 0709 Last data filed at 06/14/12 0600  Gross per 24 hour  Intake   1256 ml  Output   2490 ml  Net  -1234 ml    Inpatient Medications:     . antiseptic oral rinse  15 mL Mouth Rinse QID  . chlorhexidine  15 mL Mouth Rinse BID  . [COMPLETED] dextrose      . feeding supplement (JEVITY 1.2 CAL)  1,000 mL Per Tube Q24H  . feeding supplement  30 mL Per Tube TID  . [COMPLETED] furosemide      . [COMPLETED] furosemide  20 mg Intravenous Q6H  . insulin aspart  2-6 Units Subcutaneous Q4H  . [COMPLETED] magnesium sulfate LVP 250-500 ml  2 g Intravenous Once  . metronidazole  500 mg Intravenous Q8H  . multivitamin  5 mL Per Tube Daily  . pantoprazole (PROTONIX) IV  40 mg Intravenous QHS  . piperacillin-tazobactam (ZOSYN)  IV  3.375 g Intravenous Q8H  . [COMPLETED] potassium chloride  40 mEq Per Tube Once  . vancomycin  125 mg Oral Q6H  . vancomycin  500 mg Intravenous Q24H  . [DISCONTINUED] heparin  5,000 Units Subcutaneous Q8H      . sodium chloride 15 mL/hr (06/12/12 1100)   . sodium chloride 5 mL/hr at 06/13/12 0600  . norepinephrine (LEVOPHED) Adult infusion Stopped (06/13/12 1236)  . [DISCONTINUED] fentaNYL infusion INTRAVENOUS 50 mcg/hr (06/12/12 1412)  . [DISCONTINUED] midazolam (VERSED) infusion 3 mg/hr (06/12/12 1412)    Labs:  [facility] 06/14/12 0430 06/13/12 1130 06/13/12 0500  NA 137 -- 138  K 3.3* -- 4.0  CL 94* -- 99  CO2 32 -- 29  GLUCOSE 128* -- 82  BUN 24* -- 28*  CREATININE 0.93 -- 1.10  CALCIUM 8.3* -- 8.6  MG 2.1 2.4 --  PHOS 2.4 -- 3.8    Basename 06/14/12 0430 06/12/12 1012  AST 35 113*  ALT 59* 120*  ALKPHOS 78 97  BILITOT 1.2 1.0  PROT 5.8* 6.1  ALBUMIN 2.7* 3.2*    Basename 06/14/12 0430 06/13/12 0500 06/12/12 0226  WBC 8.4 10.2 --  NEUTROABS -- -- 8.5*  HGB 11.6* 11.3* --  HCT 34.0* 32.7* --  MCV 95.5 95.9 --  PLT 159 174 --    Basename 06/12/12 2110 06/12/12 0443 06/12/12 0226  CKTOTAL -- -- --  CKMB -- -- --  TROPONINI <0.30 <0.30 <0.30    Basename 06/12/12 0436  DDIMER 1.16*    Basename 06/13/12 0848  CHOL 105  HDL 41  LDLCALC 46  TRIG 88  CHOLHDL  2.6    Basename 06/12/12 1012  TSH 0.537  T4TOTAL --  T3FREE --  THYROIDAB --    Radiology/Studies: Dg Chest Port 1 View  06/14/2012  *RADIOLOGY REPORT*  Clinical Data: Ventilator, shortness of breath.  PORTABLE CHEST - 1 VIEW  Comparison: 06/13/2012  Findings: Support devices including endotracheal tube are unchanged.  There is left base atelectasis or infiltrate, stable. Right lung is clear.  Heart is mildly enlarged.  No visible effusions.  No acute bony abnormality.  IMPRESSION: Stable left base atelectasis or infiltrate.  Mild cardiomegaly.   Original Report Authenticated By: Charlett Nose, M.D.    Dg Chest Port 1 View  06/13/2012  *RADIOLOGY REPORT*  Clinical Data: Ventilator.  PORTABLE CHEST - 1 VIEW  Comparison: 06/12/2012  Findings: Endotracheal tube and NG tube remain in place, unchanged. Left base atelectasis or infiltrate, improved  since prior study. Right lung is clear.  Heart is borderline in size.  No acute bony abnormality.  IMPRESSION: Decreasing left base atelectasis or infiltrate.   Original Report Authenticated By: Charlett Nose, M.D.    Portable Chest Xray To Verify Ett/ Central Line Placement  06/12/2012  *RADIOLOGY REPORT*  Clinical Data: ETT placement.  PORTABLE CHEST - 1 VIEW  Comparison: Chest x-ray 06/12/2012.  Findings: An endotracheal tube is in place with tip 4.3 cm above the carina. A nasogastric tube is seen extending into the stomach, however, the tip of the nasogastric tube extends below the lower margin of the image.  Compared to the prior examination there is now complete opacification of the base of the left hemithorax and slight shift of structures toward the left hemithorax, suggestive of complete left lower lobe atelectasis.  Lungs are otherwise clear.  No definite pleural effusions.  No evidence of pulmonary edema.  Mild cardiomegaly is unchanged.  Atherosclerosis of the thoracic aorta.  IMPRESSION: 1.  Support apparatus, as above. 2.  Interval development of complete left lower lobe atelectasis. Correlation for signs and symptoms of aspiration or mucous plugging is recommended.  These results were called by telephone on 06/12/2012 at 10:40 a.m. to Dr. Molli Knock, who verbally acknowledged these results.   Original Report Authenticated By: Trudie Reed, M.D.    Dg Chest Port 1 View  06/12/2012  *RADIOLOGY REPORT*  Clinical Data: Nausea, vomiting.  Post intubation.  PORTABLE CHEST - 1 VIEW  Comparison: 10/28/2004  Findings: Endotracheal tube is 4 cm above the carina.  There is cardiomegaly.  No confluent airspace opacities or effusions.  No acute bony abnormality.  IMPRESSION: Endotracheal tube 4 cm above the carina.  Cardiomegaly.   Original Report Authenticated By: Charlett Nose, M.D.    Dg Abd Portable 1v  06/12/2012  *RADIOLOGY REPORT*  Clinical Data: Abdominal pain  PORTABLE ABDOMEN - 1 VIEW  Comparison:  None.  Findings: NG tube tip projects over the antrum of the stomach.  No disproportionate dilatation of small bowel.  Right femoral vascular catheter is in place.  No obvious free intraperitoneal gas.  IMPRESSION: Nonobstructive bowel gas pattern.  NG tube.   Original Report Authenticated By: Jolaine Click, M.D.     Echocardiogram:06/12/12  LVEF 25-30%, diffuse hypokinesis. Moderate AR and MR  Telemetry: Afib   Assessment and Plan   1. Cardiac arrest, Asystole  Asystole cardiac arrest in the setting of hyperkalemia of K 7.7 which likely associated with her admitting s/s of nausea, abdominal discomfort and diarrhea. Her Troponin are negative x 2 in ED with EKG. BNP elevated (actually trending down comparing to  one week ago at 25, 000). NO signs of fluid overload.    - follow commands so no hypothermia  - off all pressors and sedation meds - On Afib with controlled HR.  - Echo shows LVEF 25-30% >> Last LVEF 40% on 06/05/12   >> Consider cardiac cath once she is extubated and stable ( No ischemic findings on her CE and EKG this admission)         Coumadin on hold and Heparin Gtt will be initiated today ( INR 1.59)  >> Consider to add Beta blocker, ACEI,   >> LDL 46, HB A1C 5.6 (06/04/12)  2. Cardiomyopathy with recent hx of acute systolic CHF on 06/04/12   3. Acute respiratory failure, ? HCAP, ? Aspiration LLL collapse, on Vent. PCCM manage     SBT today per PCCM  4. Sepsis, pre PCCM  - Pancultures, C-diff pending  - IV Fentanyl and versed for sedation  - IV Vanc, Zosyn and Flagyl  - PO Vanc for ? C-diff   5. Hypokalemia and hypomagnesemia, Mg is normalized. K is repleted by PCCM   6. Acute renal failure and hyperkalemia, resolved,PCCM manage   7. Elevated transaminase level, resolving, likely associated with Cardiac arrest.  8. Elevated D-Dimer, INR is therapeutic but she was immobile while previously hospitalized, did have both abdominal pain and leg pain.  -Well score low  probability. Could related to her hospital visit, No right heart strains on EKG.   9. Chronic A fib on coumadin,   Coumadin on hold and will start Heparin gtt bridging to cath.   10. HTN hold home meds   Code status: Full code   VTE: Heparin SQ      Signed, LI, NA PGY-2 7:27 AM Patient seen and examined and history reviewed. Agree with above findings and plan. Patient is a 56 yo WF admitted with asystolic cardiac arrest. She was severely hyperkalemic. Now intubated but alert and awake. Anticipate extubation today. Now hypokalemic. Being repleated. Exam reveals patient is alert without complaints. Lungs clear. No S3. No edema. Plan resuming beta blocker today for Afib rate control and CHF. Dig on hold for concern of Dig toxicity- level 1.8 on admit. Patient has worsening systolic LV function by Echo. She has had runs of NSVT on monitor. Needs full ischemic work up with right and left cardiac cath once extubated. Coumadin on hold for procedure. Will start heparin. Plan cath tomorrow if stable.   Theron Arista [facility] 06/14/2012 9:18 AM

## 2012-06-14 NOTE — Progress Notes (Signed)
ANTICOAGULATION CONSULT NOTE - Initial Consult  Pharmacy Consult for Heparin Indication: atrial fibrillation  No Known Allergies  Patient Measurements: Height: 5' (152.4 cm) Weight: 117 lb 4.6 oz (53.2 kg) IBW/kg (Calculated) : 45.5   Vital Signs: Temp: 99.5 F (37.5 C) (11/13 1600) BP: 132/79 mmHg (11/13 1900) Pulse Rate: 54  (11/13 1900)  Labs:  Basename 06/14/12 1640 06/14/12 0430 06/13/12 0848 06/13/12 0500 06/12/12 2110 06/12/12 1012 06/12/12 0443 06/12/12 0436 06/12/12 0228 06/12/12 0226  HGB -- 11.6* -- 11.3* -- -- -- -- -- --  HCT -- 34.0* -- 32.7* -- 36.1 -- -- -- --  PLT -- 159 -- 174 -- 163 -- -- -- --  APTT -- -- -- -- -- -- -- 30 31 --  LABPROT -- 18.5* 22.0* -- -- 22.9* -- -- -- --  INR -- 1.59* 2.01* -- -- 2.13* -- -- -- --  HEPARINUNFRC 1.18* -- -- -- -- -- -- -- -- --  CREATININE -- 0.93 -- 1.10 -- 1.08 -- -- -- --  CKTOTAL -- -- -- -- -- -- -- -- -- --  CKMB -- -- -- -- -- -- -- -- -- --  TROPONINI -- -- -- -- <0.30 -- <0.30 -- -- <0.30    Estimated Creatinine Clearance: 47.9 ml/min (by C-G formula based on Cr of 0.93).   Medical History: Past Medical History  Diagnosis Date  . Paroxysmal supraventricular tachycardia     Lost to followup-2006  . Atrial fibrillation     Echocardiogram in 2004-borderline LV function; no valvular abnormalities  . Right bundle branch block   . Anxiety   . Anemia     2011: Hemoglobin of 11.4; hematocrit of 33.5; normal MCV    Medications:  Scheduled:     . carvedilol  3.125 mg Oral BID  . chlorhexidine  15 mL Mouth Rinse BID  . [COMPLETED] furosemide  20 mg Intravenous Q6H  . [COMPLETED] heparin  1,350 Units Intravenous Once  . insulin aspart  2-6 Units Subcutaneous Q4H  . multivitamin  5 mL Per Tube Daily  . pantoprazole  40 mg Oral Q1200  . piperacillin-tazobactam (ZOSYN)  IV  3.375 g Intravenous Q8H  . [COMPLETED] potassium chloride  40 mEq Per Tube Once  . [COMPLETED] potassium chloride  40 mEq Oral Once   . sodium chloride  3 mL Intravenous Q12H  . vancomycin  500 mg Intravenous Q24H  . [DISCONTINUED] antiseptic oral rinse  15 mL Mouth Rinse QID  . [DISCONTINUED] carvedilol  3.125 mg Oral BID WC  . [DISCONTINUED] feeding supplement (JEVITY 1.2 CAL)  1,000 mL Per Tube Q24H  . [DISCONTINUED] feeding supplement  30 mL Per Tube TID  . [DISCONTINUED] metronidazole  500 mg Intravenous Q8H  . [DISCONTINUED] pantoprazole (PROTONIX) IV  40 mg Intravenous QHS  . [DISCONTINUED] vancomycin  125 mg Oral Q6H    Assessment: Pt is a 56 y/o F s/p cardiac arrest likely d/t hyperkalemia with a history of afib on chronic coumadin therapy. Coumadin on hold, pharmacy consulted to dose heparin when INR <2 in anticipation of cardiac cath on 11/14. INR this am in 1.59<2.01<2.13 H/H is 11.6/34, Plts 159, baseline PTT is 30. No evidence of bleeding reported. Scr 0.93, CrCl ~ 48. Will give a half-bolus given chronic coumadin and INR of 1.59.   Tonight her heparin level is resulted at 1.18 IU/ml.  I spoke with the nurse who states she is without noted bleeding.  The lab was drawn a little  early and likely still reflects the bolus that was given.  To ensure therapeutic range, we will decrease her dose and recheck her level with AM labs.  Goal of Therapy:  Heparin level 0.3-0.7 units/ml Monitor platelets by anticoagulation protocol: Yes   Plan:  - Decrease IV heparin infusion to 600 units/hr - Check AM heparin level and CBC - Monitor closely for bleeding  Nadara Mustard, PharmD., MS Clinical Pharmacist Pager:  [phone] Thank you for allowing pharmacy to be part of this patients care team. 06/14/2012 7:47 PM

## 2012-06-14 NOTE — Progress Notes (Signed)
PULMONARY  / CRITICAL CARE MEDICINE  Name: [name] [name] MRN: [id number] DOB: [birth year]    LOS: 2  REFERRING MD :  [facility]  CHIEF COMPLAINT:  Post arrest   BRIEF PATIENT DESCRIPTION:  56 yo female with hx Afib, COPD (alpha-1), CHF, HTN, DM tx from [facility] 11/11 post arrest likely r/t hyperkalemia.  Followed commands post arrest therefor no hypothermia.   LINES / TUBES: ETT 11/11>>>11/13 L fem CVL 11/11>>>11/13  CULTURES: BC x 2 11/11>>>NTD Urine 11/11>>>NTD Sputum 11/11>>>NTD C. Diff never sent since patient never had diarrhea.  ANTIBIOTICS: Vanc 11/11>>> Zosyn 11/11>>> Oral Vanc 11/11>>>11/13 Flagyl 11/11>>>11/13  SIGNIFICANT EVENTS:  2D echo 11/11>>>  LEVEL OF CARE:  ICU PRIMARY SERVICE:  PCCM CONSULTANTS:  cardiology CODE STATUS: full DIET:  NPO DVT Px:  Heparin drip then coumadin GI Px:  protonix  HISTORY OF PRESENT ILLNESS:  56 yo female with hx COPD (alpha-1), HTN, DM, Afib on coumadin, ?thyroid disease presented 11/11 to [facility] er with c/o abd pain, nausea, vomiting and diarrhea.  Just d/c 11/8 after an admission for decompensated CHF requiring intubation and prolonged ICU stay.  While being evaluated in ER 11/11 she became unresponsive, asystolic.  She was intubated and HR returned. Found to be hyperkalemic with K=7.7.  She cont to have issues with bradycardia and intermittent VT.  She followed commands post arrest and received insulin, D50 for hyperkalemia and tx to [facility].   VITAL SIGNS: Temp:  [98.1 F (36.7 C)-99.9 F (37.7 C)] 99 F (37.2 C) (11/13 1300) Pulse Rate:  [51-106] 84  (11/13 1300) Resp:  [11-27] 20  (11/13 1300) BP: (94-142)/(41-100) 126/63 mmHg (11/13 1300) SpO2:  [95 %-100 %] 98 % (11/13 1300) FiO2 (%):  [40 %] 40 % (11/13 0843)    PHYSICAL EXAMINATION: General:  Chronically ill appearing female, NAD on vent  Neuro:  Sedated on vent, follows some commands, opens eyes to voice  HEENT:  Mm moist, no jvd Cardiovascular:  s1s2  irreg, Afib with PVC's Lungs:  resps even non labored on vent, few scattered crackles Abdomen:  Soft, non tender, +bs  Ext: warm and dry, no edema   INTAKE / OUTPUT: Intake/Output      11/12 0701 - 11/13 0700 11/13 0701 - 11/14 0700   I.V. (mL/kg) 422 (7.9) 135 (2.5)   NG/GT 505 120   IV Piggyback 379 100   Total Intake(mL/kg) 1306 (24.5) 355 (6.7)   Urine (mL/kg/hr) 2340 (1.8) 610 (1.5)   Emesis/NG output 150    Total Output 2490 610   Net -1184 -255         VENTILATOR SETTINGS: Vent Mode:  [-] PSV;CPAP FiO2 (%):  [40 %] 40 % Set Rate:  [12 bmp] 12 bmp Vt Set:  [500 mL] 500 mL PEEP:  [5 cmH20] 5 cmH20 Pressure Support:  [5 cmH20] 5 cmH20 Plateau Pressure:  [18 cmH20-21 cmH20] 18 cmH20  LABS:  Lab 06/14/12 0439 06/14/12 0430 06/13/12 1130 06/13/12 0848 06/13/12 0504 06/13/12 0500 06/12/12 2110 06/12/12 1100 06/12/12 1012 06/12/12 0443 06/12/12 0436 06/12/12 0300 06/12/12 0228 06/12/12 0226  HGB -- 11.6* -- -- -- 11.3* -- -- 12.6 -- -- -- -- --  WBC -- 8.4 -- -- -- 10.2 -- -- 12.0* -- -- -- -- --  PLT -- 159 -- -- -- 174 -- -- 163 -- -- -- -- --  NA -- 137 -- -- -- 138 -- -- 138 -- -- -- -- --  K --  3.3* -- -- -- 4.0 -- -- -- -- -- -- -- --  CL -- 94* -- -- -- 99 -- -- 97 -- -- -- -- --  CO2 -- 32 -- -- -- 29 -- -- 31 -- -- -- -- --  GLUCOSE -- 128* -- -- -- 82 -- -- 87 -- -- -- -- --  BUN -- 24* -- -- -- 28* -- -- 35* -- -- -- -- --  CREATININE -- 0.93 -- -- -- 1.10 -- -- 1.08 -- -- -- -- --  CALCIUM -- 8.3* -- -- -- 8.6 -- -- 8.7 -- -- -- -- --  MG -- 2.1 2.4 -- -- 1.7 -- -- -- -- -- -- -- --  PHOS -- 2.4 -- -- -- 3.8 -- -- 4.7* -- -- -- -- --  AST -- 35 -- -- -- -- -- -- 113* -- -- -- -- 44*  ALT -- 59* -- -- -- -- -- -- 120* -- -- -- -- 66*  ALKPHOS -- 78 -- -- -- -- -- -- 97 -- -- -- -- 99  BILITOT -- 1.2 -- -- -- -- -- -- 1.0 -- -- -- -- 0.4  PROT -- 5.8* -- -- -- -- -- -- 6.1 -- -- -- -- 6.0  ALBUMIN -- 2.7* -- -- -- -- -- -- 3.2* -- -- -- -- 2.8*  APTT -- -- --  -- -- -- -- -- -- -- 30 -- 31 --  INR -- 1.59* -- 2.01* -- -- -- -- 2.13* -- -- -- -- --  LATICACIDVEN -- -- -- -- -- -- -- -- 0.8 -- -- 8.7* -- --  TROPONINI -- -- -- -- -- -- <0.30 -- -- <0.30 -- -- -- <0.30  PROCALCITON -- -- -- -- -- -- -- -- 0.60 -- -- -- -- --  PROBNP -- -- -- -- -- -- 4270.0* -- -- -- -- -- -- 3356.0*  O2SATVEN -- -- -- -- -- -- -- -- -- -- -- -- -- --  PHART 7.507* -- -- -- 7.465* -- -- 7.468* -- -- -- -- -- --  PCO2ART 40.9 -- -- -- 39.2 -- -- 40.7 -- -- -- -- -- --  PO2ART 78.3* -- -- -- 123.0* -- -- 184.0* -- -- -- -- -- --    Intake/Output Summary (Last 24 hours) at 06/14/12 1438 Last data filed at 06/14/12 1300  Gross per 24 hour  Intake   1339 ml  Output   2510 ml  Net  -1171 ml    Lab 06/14/12 0744 06/14/12 0400 06/13/12 2325 06/13/12 1943 06/13/12 1637  GLUCAP 158* 109* 130* 112* 83   IMAGING: Dg Chest Port 1 View  06/14/2012  *RADIOLOGY REPORT*  Clinical Data: Ventilator, shortness of breath.  PORTABLE CHEST - 1 VIEW  Comparison: 06/13/2012  Findings: Support devices including endotracheal tube are unchanged.  There is left base atelectasis or infiltrate, stable. Right lung is clear.  Heart is mildly enlarged.  No visible effusions.  No acute bony abnormality.  IMPRESSION: Stable left base atelectasis or infiltrate.  Mild cardiomegaly.   Original Report Authenticated By: Charlett Nose, M.D.    Dg Chest Port 1 View  06/13/2012  *RADIOLOGY REPORT*  Clinical Data: Ventilator.  PORTABLE CHEST - 1 VIEW  Comparison: 06/12/2012  Findings: Endotracheal tube and NG tube remain in place, unchanged. Left base atelectasis or infiltrate, improved since prior study. Right lung is clear.  Heart is borderline in size.  No acute bony abnormality.  IMPRESSION: Decreasing left base atelectasis or infiltrate.   Original Report Authenticated By: Charlett Nose, M.D.    ECG: Afib with PVC  DIAGNOSES: Active Problems:  Cardiac arrest  Nonischemic cardiomyopathy  Chronic  systolic CHF (congestive heart failure)  Acute respiratory failure  Sepsis  Hyperkalemia  ASSESSMENT / PLAN:  PULMONARY  ASSESSMENT: Acute respiratory failure - post cardiac arrest presumably from hyperkalemia  ?HCAP ?aspiration - LLL collapse   PLAN:   SBT to extubate today.  Abx for HCAP coverage - see ID. Titrate O2 for sat of 92-95%. IS and flutter valve. PT/OT evaluation. OOB to chair.  CARDIOVASCULAR  ASSESSMENT:  Cardiac arrest - likely r/t hyperkalemia  Bradycardia Afib - on coumadin  PVC's  Diastolic CHF   PLAN:  2D echo noted. Cardiac enzymes negative. Cards input appreciated. Start coumadin per pharmacy. Consider heparin gtt - INR remains therapeutic at this time. F/u chem - see renal.  RENAL  ASSESSMENT:   Hyperkalemia - Per family placed on K supplement after she was found to be hypokalemic r/t vomiting/diarrhea.  Renal insufficiency - mild   PLAN:   K down to 3.3, will give a single dose of k-dur and recheck. Hold diureses for today.  GASTROINTESTINAL  ASSESSMENT:   Abd pain  Nausea/vomiting  Diarrhea -- ??CDiff   PLAN:   KUB negative Stop empiric flagyl, po vanc. Nutrition consult for nutrition assessment for severe malnutrition.  HEMATOLOGIC  ASSESSMENT:   No acute issue   PLAN:  F/u cbc    INFECTIOUS  ASSESSMENT:   Leukocytosis  ?HCAP v aspiration   PLAN:   Empiric abx for HCAP, for C-diff was stopped due to no diarrhea since patient has been here. Pan culture  Will narrow once cultures are present.  ENDOCRINE  ASSESSMENT:   DM ?thyroid disease - on no supp     PLAN:   ICU hyperglycemia protocol  Check TSH, free t4   NEUROLOGIC  ASSESSMENT:   AMS - post arrest.  Does follow commands so no hypothermia   PLAN:   D/C all sedation  Family updated at length, stressed need for rehab, will likely need a rehab facility once ready to leave the hospital.  CC time 35 min.  Alyson Reedy, M.D. [facility]  Pulmonary/Critical Care Medicine. Pager: [phone]. After hours pager: [phone].

## 2012-06-14 NOTE — Progress Notes (Signed)
ANTICOAGULATION CONSULT NOTE - Initial Consult  Pharmacy Consult for Heparin Indication: atrial fibrillation  No Known Allergies  Patient Measurements: Height: 5' (152.4 cm) Weight: 117 lb 4.6 oz (53.2 kg) IBW/kg (Calculated) : 45.5   Vital Signs: Temp: 99.7 F (37.6 C) (11/13 0900) Temp src: Oral (11/13 0747) BP: 111/41 mmHg (11/13 0900) Pulse Rate: 71  (11/13 0900)  Labs:  Basename 06/14/12 0430 06/13/12 0848 06/13/12 0500 06/12/12 2110 06/12/12 1012 06/12/12 0443 06/12/12 0436 06/12/12 0228 06/12/12 0226  HGB 11.6* -- 11.3* -- -- -- -- -- --  HCT 34.0* -- 32.7* -- 36.1 -- -- -- --  PLT 159 -- 174 -- 163 -- -- -- --  APTT -- -- -- -- -- -- 30 31 --  LABPROT 18.5* 22.0* -- -- 22.9* -- -- -- --  INR 1.59* 2.01* -- -- 2.13* -- -- -- --  HEPARINUNFRC -- -- -- -- -- -- -- -- --  CREATININE 0.93 -- 1.10 -- 1.08 -- -- -- --  CKTOTAL -- -- -- -- -- -- -- -- --  CKMB -- -- -- -- -- -- -- -- --  TROPONINI -- -- -- <0.30 -- <0.30 -- -- <0.30    Estimated Creatinine Clearance: 47.9 ml/min (by C-G formula based on Cr of 0.93).   Medical History: Past Medical History  Diagnosis Date  . Paroxysmal supraventricular tachycardia     Lost to followup-2006  . Atrial fibrillation     Echocardiogram in 2004-borderline LV function; no valvular abnormalities  . Right bundle branch block   . Anxiety   . Anemia     2011: Hemoglobin of 11.4; hematocrit of 33.5; normal MCV    Medications:  Scheduled:    . antiseptic oral rinse  15 mL Mouth Rinse QID  . carvedilol  3.125 mg Oral BID  . chlorhexidine  15 mL Mouth Rinse BID  . feeding supplement (JEVITY 1.2 CAL)  1,000 mL Per Tube Q24H  . feeding supplement  30 mL Per Tube TID  . [COMPLETED] furosemide      . [COMPLETED] furosemide  20 mg Intravenous Q6H  . insulin aspart  2-6 Units Subcutaneous Q4H  . [COMPLETED] magnesium sulfate LVP 250-500 ml  2 g Intravenous Once  . metronidazole  500 mg Intravenous Q8H  . multivitamin  5 mL  Per Tube Daily  . pantoprazole (PROTONIX) IV  40 mg Intravenous QHS  . piperacillin-tazobactam (ZOSYN)  IV  3.375 g Intravenous Q8H  . [COMPLETED] potassium chloride  40 mEq Per Tube Once  . vancomycin  125 mg Oral Q6H  . vancomycin  500 mg Intravenous Q24H  . [DISCONTINUED] carvedilol  3.125 mg Oral BID WC    Assessment: Pt is a 56 y/o F s/p cardiac arrest likely d/t hyperkalemia with a history of afib on chronic coumadin therapy. Coumadin on hold, pharmacy consulted to dose heparin when INR <2 in anticipation of cardiac cath on 11/14. INR this am in 1.59<2.01<2.13 H/H is 11.6/34, Plts 159, baseline PTT is 30. No evidence of bleeding reported. Scr 0.93, CrCl ~ 48. Will give a half-bolus given chronic coumadin and INR of 1.59.   Goal of Therapy:  Heparin level 0.3-0.7 units/ml Monitor platelets by anticoagulation protocol: Yes   Plan:  - Heparin 1350 units bolus x 1  - Start heparin infusion at 750 units/hr - 6 hour heparin level - Daily heparin level/CBC - Monitor closely for bleeding  Abran Duke, PharmD Clinical Pharmacist Phone: [phone] Pager: [phone] 06/14/2012 9:29  AM

## 2012-06-14 NOTE — Procedures (Signed)
Extubation Procedure Note  Patient Details:   Name: [name] [name] DOB: April 02, 1956 MRN: [id number]   Airway Documentation:     Evaluation  O2 sats: stable throughout Complications: No apparent complications Patient did tolerate procedure well. Bilateral Breath Sounds: Clear Suctioning: Airway Yes  Patient extubated and placed on 2LNC. No complications, no stridor. HR 95 100% RR-24 120/67  Adolm Joseph 06/14/2012, 10:17 AM

## 2012-06-15 ENCOUNTER — Ambulatory Visit (HOSPITAL_COMMUNITY): Admit: 2012-06-15 | Payer: Self-pay | Admitting: Cardiovascular Disease

## 2012-06-15 ENCOUNTER — Encounter (HOSPITAL_COMMUNITY): Payer: Self-pay

## 2012-06-15 ENCOUNTER — Encounter (HOSPITAL_COMMUNITY): Payer: Self-pay | Admitting: Cardiology

## 2012-06-15 ENCOUNTER — Encounter (HOSPITAL_COMMUNITY)
Admission: EM | Disposition: A | Discharge status: Home / Self Care | Payer: Self-pay | Source: Home / Self Care | Attending: Cardiology

## 2012-06-15 DIAGNOSIS — I2510 Atherosclerotic heart disease of native coronary artery without angina pectoris: Secondary | ICD-10-CM

## 2012-06-15 HISTORY — PX: LEFT HEART CATHETERIZATION WITH CORONARY ANGIOGRAM: SHX5451

## 2012-06-15 LAB — HEPARIN LEVEL (UNFRACTIONATED)
Heparin Unfractionated: 0.15 IU/mL — ABNORMAL LOW (ref 0.30–0.70)
Heparin Unfractionated: 0.17 IU/mL — ABNORMAL LOW (ref 0.30–0.70)

## 2012-06-15 LAB — CBC
MCH: 31.8 pg (ref 26.0–34.0)
MCHC: 33.2 g/dL (ref 30.0–36.0)
Platelets: 158 10*3/uL (ref 150–400)
RDW: 14.5 % (ref 11.5–15.5)

## 2012-06-15 LAB — BASIC METABOLIC PANEL
Calcium: 8.2 mg/dL — ABNORMAL LOW (ref 8.4–10.5)
GFR calc Af Amer: >90 mL/min (ref >90)
GFR calc non Af Amer: >90 mL/min (ref >90)
Potassium: 3.8 mEq/L (ref 3.5–5.1)
Sodium: 134 mEq/L — ABNORMAL LOW (ref 135–145)

## 2012-06-15 LAB — PHOSPHORUS: Phosphorus: 2 mg/dL — ABNORMAL LOW (ref 2.3–4.6)

## 2012-06-15 LAB — GLUCOSE, CAPILLARY
Glucose-Capillary: 72 mg/dL (ref 70–99)
Glucose-Capillary: 81 mg/dL (ref 70–99)
Glucose-Capillary: 84 mg/dL (ref 70–99)

## 2012-06-15 SURGERY — LEFT HEART CATHETERIZATION WITH CORONARY ANGIOGRAM
Anesthesia: LOCAL

## 2012-06-15 MED ORDER — SODIUM CHLORIDE 0.9 % IV SOLN
1.5000 g | Freq: Four times a day (QID) | INTRAVENOUS | Status: AC
  Administered 2012-06-15 – 2012-06-19 (×16): 1.5 g via INTRAVENOUS
  Filled 2012-06-15 (×18): qty 1.5

## 2012-06-15 MED ORDER — LIDOCAINE HCL (PF) 1 % IJ SOLN
INTRAMUSCULAR | Status: AC
  Filled 2012-06-15: qty 30

## 2012-06-15 MED ORDER — CARVEDILOL 6.25 MG PO TABS
6.2500 mg | ORAL_TABLET | Freq: Two times a day (BID) | ORAL | Status: DC
  Administered 2012-06-15 – 2012-06-17 (×4): 6.25 mg via ORAL
  Filled 2012-06-15 (×8): qty 1

## 2012-06-15 MED ORDER — WARFARIN SODIUM 5 MG PO TABS
5.0000 mg | ORAL_TABLET | Freq: Once | ORAL | Status: AC
  Administered 2012-06-15: 5 mg via ORAL
  Filled 2012-06-15: qty 1

## 2012-06-15 MED ORDER — ONDANSETRON HCL 4 MG/2ML IJ SOLN
4.0000 mg | Freq: Four times a day (QID) | INTRAMUSCULAR | Status: DC | PRN
  Administered 2012-06-17: 4 mg via INTRAVENOUS
  Filled 2012-06-15: qty 2

## 2012-06-15 MED ORDER — FENTANYL CITRATE 0.05 MG/ML IJ SOLN
INTRAMUSCULAR | Status: AC
  Filled 2012-06-15: qty 2

## 2012-06-15 MED ORDER — DIAZEPAM 2 MG PO TABS
2.0000 mg | ORAL_TABLET | ORAL | Status: AC
  Administered 2012-06-15: 2 mg via ORAL
  Filled 2012-06-15: qty 1

## 2012-06-15 MED ORDER — SODIUM PHOSPHATE 3 MMOLE/ML IV SOLN: 30.0000 mmol | Freq: Once | INTRAVENOUS | Status: DC

## 2012-06-15 MED ORDER — WARFARIN - PHARMACIST DOSING INPATIENT
Freq: Every day | Status: DC
  Administered 2012-06-15 – 2012-06-24 (×3)

## 2012-06-15 MED ORDER — SODIUM CHLORIDE 0.9 % IV SOLN: INTRAVENOUS | Status: AC

## 2012-06-15 MED ORDER — LISINOPRIL 2.5 MG PO TABS
2.5000 mg | ORAL_TABLET | Freq: Two times a day (BID) | ORAL | Status: DC
  Administered 2012-06-15 – 2012-06-16 (×3): 2.5 mg via ORAL
  Filled 2012-06-15 (×5): qty 1

## 2012-06-15 MED ORDER — MIDAZOLAM HCL 2 MG/2ML IJ SOLN
INTRAMUSCULAR | Status: AC
  Filled 2012-06-15: qty 2

## 2012-06-15 MED ORDER — HEPARIN (PORCINE) IN NACL 2-0.9 UNIT/ML-% IJ SOLN
INTRAMUSCULAR | Status: AC
  Filled 2012-06-15: qty 1500

## 2012-06-15 MED ORDER — NITROGLYCERIN 0.2 MG/ML ON CALL CATH LAB
INTRAVENOUS | Status: AC
  Filled 2012-06-15: qty 1

## 2012-06-15 MED ORDER — ASPIRIN 81 MG PO CHEW
324.0000 mg | CHEWABLE_TABLET | ORAL | Status: AC
  Administered 2012-06-15: 324 mg via ORAL
  Filled 2012-06-15: qty 4

## 2012-06-15 MED ORDER — GLUCOSE 40 % PO GEL
ORAL | Status: AC
  Administered 2012-06-15: 37.5 g
  Filled 2012-06-15: qty 1

## 2012-06-15 MED ORDER — SODIUM GLYCEROPHOSPHATE 1 MMOLE/ML IV SOLN
30.0000 mmol | Freq: Once | INTRAVENOUS | Status: AC
  Administered 2012-06-15: 30 mmol via INTRAVENOUS
  Filled 2012-06-15: qty 30

## 2012-06-15 NOTE — Progress Notes (Signed)
ANTICOAGULATION CONSULT NOTE - FOLLOW UP  Pharmacy Consult for Coumadin Indication:  a-fib  No Known Allergies  Patient Measurements: Height: 5' (152.4 cm) Weight: 113 lb 1.5 oz (51.3 kg) IBW/kg (Calculated) : 45.5   Vital Signs: Temp: 98.3 F (36.8 C) (11/14 1128) Temp src: Oral (11/14 1128) BP: 128/72 mmHg (11/14 1200) Pulse Rate: 107  (11/14 1247) Intake/Output from previous day: 11/13 0701 - 11/14 0700 In: 926.5 [I.V.:706.5; NG/GT:120; IV Piggyback:100] Out: 1095 [Urine:1095] Intake/Output from this shift: Total I/O In: 544.2 [I.V.:264.2; IV Piggyback:280] Out: 400 [Urine:400]  Labs:  [facility] 06/15/12 0355 06/14/12 0430 06/13/12 0500  WBC 6.8 8.4 10.2  HGB 11.2* 11.6* 11.3*  PLT 158 159 174  LABCREA -- -- --  CREATININE 0.77 0.93 1.10   Estimated Creatinine Clearance: 55.7 ml/min (by C-G formula based on Cr of 0.77).  Assessment:  Pt is a 56 y/o F transfer from [facility] s/p arrest likely d/t hyperkalemia (7.7). Pt was just d/c from the hospital after a prolonged stay for decompensated HF and prolonged ICU stay. Pt s/p LHC today - no significant coronary obstruction at this time. Heparin d/c post-cath and plan to restart coumadin tonight. Pt was on 5mg  daily at home with therapeutic INR on admit.   Goal of Therapy:  INR 2-3  Plan:  - Coumadin 5mg  po tonight - Daily INR  Christoper Fabian, PharmD, BCPS Clinical pharmacist, pager [phone] 06/15/2012 3:59 PM

## 2012-06-15 NOTE — Progress Notes (Signed)
ANTIBIOTIC/ANTICOAGULATION CONSULT NOTE - FOLLOW UP  Pharmacy Consult for Unasyn/Heparin Indication: pneumonia (HCAP vs aspiration), a-fib  No Known Allergies  Patient Measurements: Height: 5' (152.4 cm) Weight: 113 lb 1.5 oz (51.3 kg) IBW/kg (Calculated) : 45.5   Vital Signs: Temp: 98.3 F (36.8 C) (11/14 1128) Temp src: Oral (11/14 1128) BP: 128/72 mmHg (11/14 1200) Pulse Rate: 47  (11/14 1200) Intake/Output from previous day: 11/13 0701 - 11/14 0700 In: 926.5 [I.V.:706.5; NG/GT:120; IV Piggyback:100] Out: 1095 [Urine:1095] Intake/Output from this shift: Total I/O In: 544.2 [I.V.:264.2; IV Piggyback:280] Out: 400 [Urine:400]  Labs:  [facility] 06/15/12 0355 06/14/12 0430 06/13/12 0500  WBC 6.8 8.4 10.2  HGB 11.2* 11.6* 11.3*  PLT 158 159 174  LABCREA -- -- --  CREATININE 0.77 0.93 1.10   Estimated Creatinine Clearance: 55.7 ml/min (by C-G formula based on Cr of 0.77). No results found for this basename: VANCOTROUGH:2,VANCOPEAK:2,VANCORANDOM:2,GENTTROUGH:2,GENTPEAK:2,GENTRANDOM:2,TOBRATROUGH:2,TOBRAPEAK:2,TOBRARND:2,AMIKACINPEAK:2,AMIKACINTROU:2,AMIKACIN:2, in the last 72 hours   Microbiology: Recent Results (from the past 720 hour(s))  CULTURE, BLOOD (ROUTINE X 2)     Status: Normal (Preliminary result)   Collection Time   06/12/12  2:26 AM      Component Value Range Status Comment   Specimen Description BLOOD RIGHT ARM   Final    Special Requests BOTTLES DRAWN AEROBIC AND ANAEROBIC 6CC   Final    Culture NO GROWTH 3 DAYS   Final    Report Status PENDING   Incomplete   MRSA PCR SCREENING     Status: Normal   Collection Time   06/12/12  9:33 AM      Component Value Range Status Comment   MRSA by PCR NEGATIVE  NEGATIVE Final   CULTURE, BLOOD (ROUTINE X 2)     Status: Normal (Preliminary result)   Collection Time   06/12/12 12:15 PM      Component Value Range Status Comment   Specimen Description BLOOD LEFT ARM   Final    Special Requests BOTTLES DRAWN AEROBIC  AND ANAEROBIC 10CC   Final    Culture  Setup Time 06/12/2012 18:23   Final    Culture     Final    Value:        BLOOD CULTURE RECEIVED NO GROWTH TO DATE CULTURE WILL BE HELD FOR 5 DAYS BEFORE ISSUING A FINAL NEGATIVE REPORT   Report Status PENDING   Incomplete   CULTURE, BLOOD (ROUTINE X 2)     Status: Normal (Preliminary result)   Collection Time   06/12/12 12:27 PM      Component Value Range Status Comment   Specimen Description BLOOD LEFT WRIST   Final    Special Requests BOTTLES DRAWN AEROBIC ONLY 10CC   Final    Culture  Setup Time 06/12/2012 18:23   Final    Culture     Final    Value:        BLOOD CULTURE RECEIVED NO GROWTH TO DATE CULTURE WILL BE HELD FOR 5 DAYS BEFORE ISSUING A FINAL NEGATIVE REPORT   Report Status PENDING   Incomplete   URINE CULTURE     Status: Normal   Collection Time   06/12/12 12:40 PM      Component Value Range Status Comment   Specimen Description URINE, CATHETERIZED   Final    Special Requests NONE   Final    Culture  Setup Time 06/12/2012 13:19   Final    Colony Count NO GROWTH   Final    Culture NO GROWTH  Final    Report Status 06/13/2012 FINAL   Final   CULTURE, RESPIRATORY     Status: Normal   Collection Time   06/12/12  2:00 PM      Component Value Range Status Comment   Specimen Description BRONCHIAL WASHINGS   Final    Special Requests NONE   Final    Gram Stain     Final    Value: NO WBC SEEN     NO SQUAMOUS EPITHELIAL CELLS SEEN     NO ORGANISMS SEEN   Culture NO GROWTH 2 DAYS   Final    Report Status 06/14/2012 FINAL   Final   AFB CULTURE WITH SMEAR     Status: Normal (Preliminary result)   Collection Time   06/12/12  2:00 PM      Component Value Range Status Comment   Specimen Description BRONCHIAL WASHINGS   Final    Special Requests NONE   Final    ACID FAST SMEAR NO ACID FAST BACILLI SEEN   Final    Culture     Final    Value: CULTURE WILL BE EXAMINED FOR 6 WEEKS BEFORE ISSUING A FINAL REPORT   Report Status PENDING    Incomplete   CULTURE, BAL-QUANTITATIVE     Status: Normal   Collection Time   06/12/12  2:00 PM      Component Value Range Status Comment   Specimen Description BRONCHIAL WASHINGS   Final    Special Requests NONE   Final    Gram Stain     Final    Value: MODERATE WBC PRESENT, PREDOMINANTLY PMN     NO SQUAMOUS EPITHELIAL CELLS SEEN     NO ORGANISMS SEEN   Colony Count NO GROWTH   Final    Culture NO GROWTH 2 DAYS   Final    Report Status 06/14/2012 FINAL   Final   FUNGUS CULTURE W SMEAR     Status: Normal (Preliminary result)   Collection Time   06/12/12  2:00 PM      Component Value Range Status Comment   Specimen Description BRONCHIAL WASHINGS   Final    Special Requests NONE   Final    Fungal Smear NO YEAST OR FUNGAL ELEMENTS SEEN   Final    Culture CULTURE IN PROGRESS FOR FOUR WEEKS   Final    Report Status PENDING   Incomplete     Anti-infectives     Start     Dose/Rate Route Frequency Ordered Stop   06/15/12 1600   ampicillin-sulbactam (UNASYN) 1.5 g in sodium chloride 0.9 % 50 mL IVPB        1.5 g 100 mL/hr over 30 Minutes Intravenous Every 6 hours 06/15/12 1203 06/19/12 1559   06/13/12 0400   vancomycin (VANCOCIN) 500 mg in sodium chloride 0.9 % 100 mL IVPB  Status:  Discontinued        500 mg 100 mL/hr over 60 Minutes Intravenous Every 24 hours 06/12/12 1127 06/15/12 1159   06/12/12 1400   piperacillin-tazobactam (ZOSYN) IVPB 3.375 g  Status:  Discontinued        3.375 g 12.5 mL/hr over 240 Minutes Intravenous 3 times per day 06/12/12 1127 06/15/12 1159   06/12/12 1200   vancomycin (VANCOCIN) 50 mg/mL oral solution 125 mg  Status:  Discontinued        125 mg Oral 4 times per day 06/12/12 1046 06/14/12 1443   06/12/12 1045   metroNIDAZOLE (FLAGYL) IVPB 500  mg  Status:  Discontinued        500 mg 100 mL/hr over 60 Minutes Intravenous Every 8 hours 06/12/12 1046 06/14/12 1443   06/12/12 0400  piperacillin-tazobactam (ZOSYN) IVPB 3.375 g       3.375 g 12.5 mL/hr  over 240 Minutes Intravenous  Once 06/12/12 0348 06/12/12 0754   06/12/12 0400   vancomycin (VANCOCIN) IVPB 1000 mg/200 mL premix        1,000 mg 200 mL/hr over 60 Minutes Intravenous  Once 06/12/12 0348 06/12/12 0525          Assessment:  Pt is a 56 y/o F transfer from [facility] s/p arrest likely d/t hyperkalemia (7.7). Pt was just d/c from the hospital after a prolonged stay for decompensated HF and prolonged ICU stay. Pharmacy is consulted to start unasyn after d/c of vanco/zosyn for a total 8 days course of ABX. WBC 6.8<8.4, Afebrile. Scr 0.77, CrCl ~ 56. All cultures negative so far. CXR reveals stable L- basilar atelectasis/infiltrate. HL at 1136 on 700 units/hr of heparin was 0.17. Pt is gone to cath and will likely d/c heparin and start coumadin tonight if no bleeding issues occur. If heparin is to continue would need dose adjustment and f/u level.   11/14 Unasyn >> end date in place 11/18 11/11 Vanco >> 11/14 11/11 Zosyn >> 11/14 11/11 PO vanco >>11/13 11/11 Flagyl >>11/13  Goal of Therapy:  Eradication of infection  Plan:  -Start Unasyn 1.5g IV q6h with end date of 11/18 (8 day course of total antibiotics) -Likely off heparin after cardiac cath and start warfarin tonight if no bleeding issues arise -Trend WBC, temp -f/u renal function -follow clinical progression  Abran Duke, PharmD Clinical Pharmacist Phone: [phone] Pager: [phone] 06/15/2012 1:15 PM

## 2012-06-15 NOTE — Progress Notes (Signed)
PT Cancellation Note  Patient Details Name: [name] [name] MRN: [id number] DOB: 07-Dec-1955   Cancelled Treatment:    Reason Eval/Treat Not Completed: Patient at procedure or test/unavailable. Pt at cardiac cath will f/u 11/15.    [facility] HELEN 06/15/2012, 12:48 PM Pager: [phone]

## 2012-06-15 NOTE — Progress Notes (Signed)
Patient transported to cath lab on cardiac monitor accompanied by cath lab tec and RN. Vitals remained stable during transport. O2 continued at 2Lpm. Report given to cath lab staff. Patient denied complaints.

## 2012-06-15 NOTE — Progress Notes (Signed)
Foley discontinued per MD order. Patient tolerated well. Will continue to monitor urine output and assess for any discomfort.   [name] [name] Vin  06/15/2012

## 2012-06-15 NOTE — Progress Notes (Signed)
ANTICOAGULATION CONSULT NOTE - Follow Up Consult  Pharmacy Consult for heparin Indication: atrial fibrillation  Labs:  [facility] 06/15/12 0355 06/14/12 1640 06/14/12 0430 06/13/12 0848 06/13/12 0500 06/12/12 2110 06/12/12 1012  HGB 11.2* -- 11.6* -- -- -- --  HCT 33.7* -- 34.0* -- 32.7* -- --  PLT 158 -- 159 -- 174 -- --  APTT -- -- -- -- -- -- --  LABPROT -- -- 18.5* 22.0* -- -- 22.9*  INR -- -- 1.59* 2.01* -- -- 2.13*  HEPARINUNFRC 0.15* 1.18* -- -- -- -- --  CREATININE -- -- 0.93 -- 1.10 -- 1.08  CKTOTAL -- -- -- -- -- -- --  CKMB -- -- -- -- -- -- --  TROPONINI -- -- -- -- -- <0.30 --    Assessment: 56yo female now subtherapeutic on heparin after rate decrease for supratherapeutic level possibly due to bolus.  Goal of Therapy:  Heparin level 0.3-0.7 units/ml   Plan:  Will increase heparin gtt to 700 units/hr (had been high at 750/hr) and check level in 6hr.  Colleen Can PharmD BCPS 06/15/2012,5:25 AM

## 2012-06-15 NOTE — CV Procedure (Signed)
   Cardiac Catheterization Procedure Note  Name: [name] [name] MRN: [id number] DOB: March 01, 1956  Procedure: Left Heart Cath, Selective Coronary Angiography, LV angiography  Indication: cardiac arrest with cardiomyopathy.  She has multiple radial sticks with some hematoma.  Therefore, 29F will be used from the groin.  Her INR remains 1.56.    Procedural details: The right groin was prepped, draped, and anesthetized with 1% lidocaine. Using modified Seldinger technique, a 4 French sheath was introduced into the right femoral artery. Standard Judkins catheters were used for coronary angiography and left ventriculography. Catheter exchanges were performed over a guidewire. There were no immediate procedural complications. The patient was transferred to the post catheterization recovery area for further monitoring.  Procedural Findings: Hemodynamics:  AO 118/68 (83) LV 119/8 Cannot calculate gradient due to rapid atrial fib    Coronary angiography: Coronary dominance: right  Left mainstem: Short without significant disease  Left anterior descending (LAD): There is calcification proximally. There are two major diagonal branches.  Between the first and second diagonal branches are approximately 30-40% plaquing.  The distal LAD has mild irregularity.  Both diagonals have minimal irregularity.  Left circumflex (LCx): Provides one large bifurcation marginal branch and a small AV circumflex.  There is minor distal irregularity.  The AV circ is small  Right coronary artery (RCA): Provides a large PDA and a large and smaller PLA branches.  No significant focal obstruction.    Left ventriculography: Due to ectopy, EF cannot be calculated.  With AF, difficult to be sure of function but appears 35%.  There does appear to be some MR.     Final Conclusions:   1.  Atrial fib with rapid ventricular response.   2.  Mild calcification of proximal LAD without critical obstruction.  No high grade coronary  artery disease.    Recommendations:  1.  She will need to be re echoed once she slows down and stabilizes.  Her LV dysfunction seems likely related to her rapid rate.  Once her rate and LV is stabilized, her valvular abnormalities can be reassessed.  There is no significant coronary obstruction at this time.  Full hemodynamic study can be undertaken later if it is clinically significant.  We avoided RHC due to need for reanticoagulation at this time, and normal LVEDP.  [name] 06/15/2012, 2:11 PM

## 2012-06-15 NOTE — Progress Notes (Addendum)
PULMONARY  / CRITICAL CARE MEDICINE  Name: [name] [name] MRN: [id number] DOB: April 28, 1956    LOS: 3  [facility]ERRING MD :  [facility]  CHIEF COMPLAINT:  Post arrest   BRIEF PATIENT DESCRIPTION:  56 yo female with hx Afib, COPD (alpha-1), CHF, HTN, DM tx from [facility] 11/11 post arrest likely r/t hyperkalemia.  Followed commands post arrest therefor no hypothermia.   LINES / TUBES: ETT 11/11>>>11/13 L fem CVL 11/11>>>11/13  CULTURES: BC x 2 11/11>>>NTD Urine 11/11>>>NTD Sputum 11/11>>>NTD C. Diff never sent since patient never had diarrhea.  ANTIBIOTICS: Vanc 11/11>>>11/14 Zosyn 11/11>>>11/14 Oral Vanc 11/11>>>11/13 Flagyl 11/11>>>11/13 Unasyn 11/14>>>  SIGNIFICANT EVENTS:  2D echo 11/11>>>  LEVEL OF CARE:  ICU PRIMARY SERVICE:  PCCM CONSULTANTS:  cardiology CODE STATUS: full DIET:  NPO DVT Px:  Heparin drip then coumadin GI Px:  protonix  HISTORY OF PRESENT ILLNESS:  56 yo female with hx COPD (alpha-1), HTN, DM, Afib on coumadin, ?thyroid disease presented 11/11 to [facility] er with c/o abd pain, nausea, vomiting and diarrhea.  Just d/c 11/8 after an admission for decompensated CHF requiring intubation and prolonged ICU stay.  While being evaluated in ER 11/11 she became unresponsive, asystolic.  She was intubated and HR returned. Found to be hyperkalemic with K=7.7.  She cont to have issues with bradycardia and intermittent VT.  She followed commands post arrest and received insulin, D50 for hyperkalemia and tx to [facility].   VITAL SIGNS: Temp:  [97.8 F (36.6 C)-99.5 F (37.5 C)] 98.4 F (36.9 C) (11/14 0800) Pulse Rate:  [49-124] 92  (11/14 1029) Resp:  [0-30] 16  (11/14 1000) BP: (112-134)/(38-100) 120/71 mmHg (11/14 1029) SpO2:  [92 %-100 %] 97 % (11/14 1000) Weight:  [51.3 kg (113 lb 1.5 oz)] 51.3 kg (113 lb 1.5 oz) (11/14 0500)    PHYSICAL EXAMINATION: General:  Chronically ill appearing female, NAD on vent  Neuro:  Sedated on vent, follows some commands,  opens eyes to voice  HEENT:  Mm moist, no jvd Cardiovascular:  s1s2 irreg, Afib with PVC's Lungs:  resps even non labored on vent, few scattered crackles Abdomen:  Soft, non tender, +bs  Ext: warm and dry, no edema   INTAKE / OUTPUT: Intake/Output      11/13 0701 - 11/14 0700 11/14 0701 - 11/15 0700   I.V. (mL/kg) 706.5 (13.8) 127.7 (2.5)   NG/GT 120    IV Piggyback 100 280   Total Intake(mL/kg) 926.5 (18.1) 407.7 (7.9)   Urine (mL/kg/hr) 1095 (0.9)    Emesis/NG output     Total Output 1095    Net -168.5 +407.7         VENTILATOR SETTINGS:    LABS:  Lab 06/15/12 0355 06/14/12 0439 06/14/12 0430 06/13/12 1130 06/13/12 0848 06/13/12 0504 06/13/12 0500 06/12/12 2110 06/12/12 1100 06/12/12 1012 06/12/12 0443 06/12/12 0436 06/12/12 0300 06/12/12 0228 06/12/12 0226  HGB 11.2* -- 11.6* -- -- -- 11.3* -- -- -- -- -- -- -- --  WBC 6.8 -- 8.4 -- -- -- 10.2 -- -- -- -- -- -- -- --  PLT 158 -- 159 -- -- -- 174 -- -- -- -- -- -- -- --  NA 134* -- 137 -- -- -- 138 -- -- -- -- -- -- -- --  K 3.8 -- 3.3* -- -- -- -- -- -- -- -- -- -- -- --  CL 97 -- 94* -- -- -- 99 -- -- -- -- -- -- -- --  CO2 29 -- 32 -- -- -- 29 -- -- -- -- -- -- -- --  GLUCOSE 87 -- 128* -- -- -- 82 -- -- -- -- -- -- -- --  BUN 18 -- 24* -- -- -- 28* -- -- -- -- -- -- -- --  CREATININE 0.77 -- 0.93 -- -- -- 1.10 -- -- -- -- -- -- -- --  CALCIUM 8.2* -- 8.3* -- -- -- 8.6 -- -- -- -- -- -- -- --  MG 2.0 -- 2.1 2.4 -- -- -- -- -- -- -- -- -- -- --  PHOS 2.0* -- 2.4 -- -- -- 3.8 -- -- -- -- -- -- -- --  AST -- -- 35 -- -- -- -- -- -- 113* -- -- -- -- 44*  ALT -- -- 59* -- -- -- -- -- -- 120* -- -- -- -- 66*  ALKPHOS -- -- 78 -- -- -- -- -- -- 97 -- -- -- -- 99  BILITOT -- -- 1.2 -- -- -- -- -- -- 1.0 -- -- -- -- 0.4  PROT -- -- 5.8* -- -- -- -- -- -- 6.1 -- -- -- -- 6.0  ALBUMIN -- -- 2.7* -- -- -- -- -- -- 3.2* -- -- -- -- 2.8*  APTT -- -- -- -- -- -- -- -- -- -- -- 30 -- 31 --  INR 1.56* -- 1.59* -- 2.01* -- -- -- --  -- -- -- -- -- --  LATICACIDVEN -- -- -- -- -- -- -- -- -- 0.8 -- -- 8.7* -- --  TROPONINI -- -- -- -- -- -- -- <0.30 -- -- <0.30 -- -- -- <0.30  PROCALCITON -- -- -- -- -- -- -- -- -- 0.60 -- -- -- -- --  PROBNP -- -- -- -- -- -- -- 4270.0* -- -- -- -- -- -- 3356.0*  O2SATVEN -- -- -- -- -- -- -- -- -- -- -- -- -- -- --  PHART -- 7.507* -- -- -- 7.465* -- -- 7.468* -- -- -- -- -- --  PCO2ART -- 40.9 -- -- -- 39.2 -- -- 40.7 -- -- -- -- -- --  PO2ART -- 78.3* -- -- -- 123.0* -- -- 184.0* -- -- -- -- -- --    Intake/Output Summary (Last 24 hours) at 06/15/12 1054 Last data filed at 06/15/12 1000  Gross per 24 hour  Intake 1154.17 ml  Output    610 ml  Net 544.17 ml    Lab 06/15/12 0442 06/14/12 2309 06/14/12 2029 06/14/12 1625 06/14/12 1404  GLUCAP 84 93 78 100* 100*   IMAGING: Dg Chest Port 1 View  06/14/2012  *RADIOLOGY REPORT*  Clinical Data: Ventilator, shortness of breath.  PORTABLE CHEST - 1 VIEW  Comparison: 06/13/2012  Findings: Support devices including endotracheal tube are unchanged.  There is left base atelectasis or infiltrate, stable. Right lung is clear.  Heart is mildly enlarged.  No visible effusions.  No acute bony abnormality.  IMPRESSION: Stable left base atelectasis or infiltrate.  Mild cardiomegaly.   Original Report Authenticated By: Charlett Nose, M.D.    ECG: Afib with PVC  DIAGNOSES: Active Problems:  Cardiac arrest  Nonischemic cardiomyopathy  Chronic systolic CHF (congestive heart failure)  Acute respiratory failure  Sepsis  Hyperkalemia  ASSESSMENT / PLAN:  PULMONARY  ASSESSMENT: Acute respiratory failure - post cardiac arrest presumably from hyperkalemia  ?HCAP ?aspiration - LLL collapse   PLAN:   Titrate O2 for sat of  88-92%.  Abx for HCAP coverage - see ID. Titrate O2 for sat of 92-95%. IS and flutter valve. PT/OT evaluation. OOB to chair.  CARDIOVASCULAR  ASSESSMENT:  Cardiac arrest - likely r/t hyperkalemia  Bradycardia Afib -  on coumadin  PVC's  Diastolic CHF   PLAN:  2D echo noted. Cardiac enzymes negative. Cath today then transfer to tele per cards. Cardiology to pick patient up. Coumadin per pharmacy pending cath. Heparin gtt - INR remains therapeutic at this time. F/u chem - see renal.  RENAL  ASSESSMENT:   Hyperkalemia - Per family placed on K supplement after she was found to be hypokalemic r/t vomiting/diarrhea.  Renal insufficiency - mild   PLAN:   K up to 3.8, monitor for now. Hold diureses for today for cath.  GASTROINTESTINAL  ASSESSMENT:   Abd pain  Nausea/vomiting  Diarrhea -- ??CDiff   PLAN:   KUB negative. Stopped empiric flagyl, po vanc. Heart healthy diet.  HEMATOLOGIC  ASSESSMENT:   No acute issue   PLAN:  F/u cbc   INFECTIOUS  ASSESSMENT:   Leukocytosis  ?HCAP v aspiration   PLAN:   D/C all abx. Start unasyn per pharmacy, if ready for D/C change to augmentin and finish a total abx days of 8 days (started on 11/11>>>11/18). Pan culture NTD. Will narrow once cultures are present.  ENDOCRINE  ASSESSMENT:   DM ?thyroid disease - on no supp     PLAN:   ICU hyperglycemia protocol  TSH, free T4 noted.  NEUROLOGIC  ASSESSMENT:   AMS - post arrest.  Does follow commands so no hypothermia   PLAN:   D/C all sedation Rehab  Transfer to tele per cards, will transfer to cards service, PCCM will sign off, please call back if needed.  Family updated, stressed need for rehab, will likely need a rehab facility once ready to leave the hospital.  Alyson Reedy, M.D. [facility] Pulmonary/Critical Care Medicine. Pager: [phone]. After hours pager: [phone].

## 2012-06-15 NOTE — Progress Notes (Signed)
Re-attempted swallow evaluation however patient currently out of room for procedure. SLP will plan to f/u in am 11/15.   Ferdinand Lango MA, CCC-SLP [phone]

## 2012-06-15 NOTE — Interval H&P Note (Signed)
History and Physical Interval Note:  06/15/2012 1:19 PM  [name] [name]  has presented today for surgery, with the diagnosis of cp  The various methods of treatment have been discussed with the patient. After consideration of risks, benefits and other options for treatment, the patient has consented to  Procedure(s) (LRB) with comments: LEFT HEART CATHETERIZATION WITH CORONARY ANGIOGRAM (N/A) as a surgical intervention .  The patient's history has been reviewed, patient examined, no change in status, stable for surgery.  I have reviewed the patient's chart and labs.  Questions were answered to the patient's satisfaction.     [name]

## 2012-06-15 NOTE — H&P (View-Only) (Signed)
Patient: [name] [name] Date of Encounter: 06/15/2012, 6:45 AM Admit date: 06/12/2012     Subjective  Doing well. Tolerated well after extubation. Still on Afib with frequent PVCs. Cath today.    Objective  Physical Exam: Vitals: BP 132/81  Pulse 49  Temp 98 F (36.7 C) (Oral)  Resp 20  Ht 5' (1.524 m)  Wt 113 lb 1.5 oz (51.3 kg)  BMI 22.09 kg/m2  SpO2 99% General:  no acute distress. Neck: Supple. JVD not elevated. Lungs: Clear bilaterally to auscultation without wheezes, rales, or rhonchi. Breathing is unlabored. Heart: irregularly irregular. A Fib with PVCs. No murmurs, rubs, or gallops appreciated Abdomen: Soft, non-distended. BS active Extremities: No clubbing or cyanosis. No edema.  Distal pedal pulses are 2+ and equal bilaterally. Neuro: Alert and oriented X 3. Moves all extremities spontaneously. No focal deficits.   Intake/Output:  Intake/Output Summary (Last 24 hours) at 06/15/12 0645 Last data filed at 06/15/12 0600  Gross per 24 hour  Intake  848.5 ml  Output    920 ml  Net  -71.5 ml    Inpatient Medications:     . carvedilol  3.125 mg Oral BID  . [COMPLETED] heparin  1,350 Units Intravenous Once  . insulin aspart  2-6 Units Subcutaneous Q4H  . multivitamin  5 mL Per Tube Daily  . pantoprazole  40 mg Oral Q1200  . piperacillin-tazobactam (ZOSYN)  IV  3.375 g Intravenous Q8H  . [COMPLETED] potassium chloride  40 mEq Oral Once  . sodium chloride  3 mL Intravenous Q12H  . sodium glycerophosphate 0.9% NaCl IVPB  30 mmol Intravenous Once  . vancomycin  500 mg Intravenous Q24H  . [DISCONTINUED] antiseptic oral rinse  15 mL Mouth Rinse QID  . [DISCONTINUED] carvedilol  3.125 mg Oral BID WC  . [DISCONTINUED] chlorhexidine  15 mL Mouth Rinse BID  . [DISCONTINUED] feeding supplement (JEVITY 1.2 CAL)  1,000 mL Per Tube Q24H  . [DISCONTINUED] feeding supplement  30 mL Per Tube TID  . [DISCONTINUED] metronidazole  500 mg Intravenous Q8H  . [DISCONTINUED]  pantoprazole (PROTONIX) IV  40 mg Intravenous QHS  . [DISCONTINUED] sodium phosphate  Dextrose 5% IVPB  30 mmol Intravenous Once  . [DISCONTINUED] vancomycin  125 mg Oral Q6H      . sodium chloride 15 mL/hr (06/12/12 1100)  . sodium chloride 5 mL/hr at 06/13/12 0600  . sodium chloride    . heparin 700 Units/hr (06/15/12 0600)  . [DISCONTINUED] norepinephrine (LEVOPHED) Adult infusion Stopped (06/13/12 1236)    Labs:  [facility] 06/15/12 0355 06/14/12 0430  NA 134* 137  K 3.8 3.3*  CL 97 94*  CO2 29 32  GLUCOSE 87 128*  BUN 18 24*  CREATININE 0.77 0.93  CALCIUM 8.2* 8.3*  MG 2.0 2.1  PHOS 2.0* 2.4    Basename 06/14/12 0430 06/12/12 1012  AST 35 113*  ALT 59* 120*  ALKPHOS 78 97  BILITOT 1.2 1.0  PROT 5.8* 6.1  ALBUMIN 2.7* 3.2*     Basename 06/15/12 0355 06/14/12 0430  WBC 6.8 8.4  NEUTROABS -- --  HGB 11.2* 11.6*  HCT 33.7* 34.0*  MCV 95.7 95.5  PLT 158 159    Basename 06/12/12 2110  CKTOTAL --  CKMB --  TROPONINI <0.30     Basename 06/15/12 0355 06/13/12 0848  CHOL -- 105  HDL -- 41  LDLCALC -- 46  TRIG 85 --  CHOLHDL -- 2.6    Basename 06/12/12 1012  TSH 0.537  T4TOTAL --  T3FREE --  THYROIDAB --    Radiology/Studies: Dg Chest Port 1 View  06/14/2012  *RADIOLOGY REPORT*  Clinical Data: Ventilator, shortness of breath.  PORTABLE CHEST - 1 VIEW  Comparison: 06/13/2012  Findings: Support devices including endotracheal tube are unchanged.  There is left base atelectasis or infiltrate, stable. Right lung is clear.  Heart is mildly enlarged.  No visible effusions.  No acute bony abnormality.  IMPRESSION: Stable left base atelectasis or infiltrate.  Mild cardiomegaly.   Original Report Authenticated By: Charlett Nose, M.D.    Dg Chest Port 1 View  06/13/2012  *RADIOLOGY REPORT*  Clinical Data: Ventilator.  PORTABLE CHEST - 1 VIEW  Comparison: 06/12/2012  Findings: Endotracheal tube and NG tube remain in place, unchanged. Left base atelectasis or  infiltrate, improved since prior study. Right lung is clear.  Heart is borderline in size.  No acute bony abnormality.  IMPRESSION: Decreasing left base atelectasis or infiltrate.   Original Report Authenticated By: Charlett Nose, M.D.    Portable Chest Xray To Verify Ett/ Central Line Placement  06/12/2012  *RADIOLOGY REPORT*  Clinical Data: ETT placement.  PORTABLE CHEST - 1 VIEW  Comparison: Chest x-ray 06/12/2012.  Findings: An endotracheal tube is in place with tip 4.3 cm above the carina. A nasogastric tube is seen extending into the stomach, however, the tip of the nasogastric tube extends below the lower margin of the image.  Compared to the prior examination there is now complete opacification of the base of the left hemithorax and slight shift of structures toward the left hemithorax, suggestive of complete left lower lobe atelectasis.  Lungs are otherwise clear.  No definite pleural effusions.  No evidence of pulmonary edema.  Mild cardiomegaly is unchanged.  Atherosclerosis of the thoracic aorta.  IMPRESSION: 1.  Support apparatus, as above. 2.  Interval development of complete left lower lobe atelectasis. Correlation for signs and symptoms of aspiration or mucous plugging is recommended.  These results were called by telephone on 06/12/2012 at 10:40 a.m. to Dr. Molli Knock, who verbally acknowledged these results.   Original Report Authenticated By: Trudie Reed, M.D.    Dg Chest Port 1 View  06/12/2012  *RADIOLOGY REPORT*  Clinical Data: Nausea, vomiting.  Post intubation.  PORTABLE CHEST - 1 VIEW  Comparison: 10/28/2004  Findings: Endotracheal tube is 4 cm above the carina.  There is cardiomegaly.  No confluent airspace opacities or effusions.  No acute bony abnormality.  IMPRESSION: Endotracheal tube 4 cm above the carina.  Cardiomegaly.   Original Report Authenticated By: Charlett Nose, M.D.    Dg Abd Portable 1v  06/12/2012  *RADIOLOGY REPORT*  Clinical Data: Abdominal pain  PORTABLE ABDOMEN  - 1 VIEW  Comparison: None.  Findings: NG tube tip projects over the antrum of the stomach.  No disproportionate dilatation of small bowel.  Right femoral vascular catheter is in place.  No obvious free intraperitoneal gas.  IMPRESSION: Nonobstructive bowel gas pattern.  NG tube.   Original Report Authenticated By: Jolaine Click, M.D.    Echocardiogram:06/12/12  LVEF 25-30%, diffuse hypokinesis. Moderate AR and MR  Telemetry: Afib with PVCs   Assessment and Plan   Asystole cardiac arrest in the setting of hyperkalemia of K 7.7 which likely associated with her admitting s/s of nausea, abdominal discomfort and diarrhea. Her Troponin are negative x 2 in ED with EKG. BNP elevated (actually trending down comparing to one week ago at 25, 000). NO signs of fluid overload. Echo  shows LVEF 25-30% >> Last LVEF 40% on 06/05/12. LDL 46, HB A1C 5.6 (06/04/12)   1. Cardiac arrest, Asystole.      Extubated 06/14/12. off all pressors and sedation meds. - On Afib with controlled HR. Frequent PVCs.     - on Heparin gtt and off coumadin - Cardiac cath today ( R & L cath)  - Beta blocker started - Consider to add ACEI if BP tolerates  - Her Digoxin level was 1.8 on admission which was > 24 hours after her last dose. I suspect that she had Digoxin toxicity which can also contributes to her GI symptoms. We will likely half her home dose if needed upon discharge.   2. Cardiomyopathy with recent hx of acute systolic CHF on 06/04/12   3. Acute respiratory failure, ? HCAP, ? Aspiration LLL collapse, extubated. PCCM manage   4. Sepsis, pre PCCM  - Pancultures, C-diff pending  - IV Vanc, Zosyn  - off Flagyl and oral Vanc    5. Hypokalemia and hypomagnesemia, resolved. PCCM   6. Acute renal failure and hyperkalemia, resolved,PCCM manage   7. Elevated transaminase level, resolving, likely associated with Cardiac arrest.   8. Elevated D-Dimer, INR is therapeutic but she was immobile while previously hospitalized, did  have both abdominal pain and leg pain.  -Well score low probability. Could related to her hospital visit, No right heart strains on EKG.   9. Chronic A fib on coumadin,  Coumadin on hold and Heparin gtt bridging to cath.   10. HTN.  Code status: Full code   VTE: Heparin    Signed, LI, NA PGY-2 6:56 AM  Patient seen with resident, agree with the above note. Patient doing well this morning.  Good BP, does not appear volume overloaded. HR around 100, atrial fibrillation (chronic).  - LHC/RHC today given drop in EF.  She is off coumadin and on heparin gtt.  - Increase Coreg to 6.25 mg bid.  - Add lisinopril 2.5 mg bid.  - Would not use digoxin in the future in this patient.   [name] 06/15/2012 8:12 AM

## 2012-06-15 NOTE — Progress Notes (Signed)
   Patient: [name] [name] Date of Encounter: 06/15/2012, 6:45 AM Admit date: 06/12/2012     Subjective  Doing well. Tolerated well after extubation. Still on Afib with frequent PVCs. Cath today.    Objective  Physical Exam: Vitals: BP 132/81  Pulse 49  Temp 98 F (36.7 C) (Oral)  Resp 20  Ht 5' (1.524 m)  Wt 113 lb 1.5 oz (51.3 kg)  BMI 22.09 kg/m2  SpO2 99% General:  no acute distress. Neck: Supple. JVD not elevated. Lungs: Clear bilaterally to auscultation without wheezes, rales, or rhonchi. Breathing is unlabored. Heart: irregularly irregular. A Fib with PVCs. No murmurs, rubs, or gallops appreciated Abdomen: Soft, non-distended. BS active Extremities: No clubbing or cyanosis. No edema.  Distal pedal pulses are 2+ and equal bilaterally. Neuro: Alert and oriented X 3. Moves all extremities spontaneously. No focal deficits.   Intake/Output:  Intake/Output Summary (Last 24 hours) at 06/15/12 0645 Last data filed at 06/15/12 0600  Gross per 24 hour  Intake  848.5 ml  Output    920 ml  Net  -71.5 ml    Inpatient Medications:     . carvedilol  3.125 mg Oral BID  . [COMPLETED] heparin  1,350 Units Intravenous Once  . insulin aspart  2-6 Units Subcutaneous Q4H  . multivitamin  5 mL Per Tube Daily  . pantoprazole  40 mg Oral Q1200  . piperacillin-tazobactam (ZOSYN)  IV  3.375 g Intravenous Q8H  . [COMPLETED] potassium chloride  40 mEq Oral Once  . sodium chloride  3 mL Intravenous Q12H  . sodium glycerophosphate 0.9% NaCl IVPB  30 mmol Intravenous Once  . vancomycin  500 mg Intravenous Q24H  . [DISCONTINUED] antiseptic oral rinse  15 mL Mouth Rinse QID  . [DISCONTINUED] carvedilol  3.125 mg Oral BID WC  . [DISCONTINUED] chlorhexidine  15 mL Mouth Rinse BID  . [DISCONTINUED] feeding supplement (JEVITY 1.2 CAL)  1,000 mL Per Tube Q24H  . [DISCONTINUED] feeding supplement  30 mL Per Tube TID  . [DISCONTINUED] metronidazole  500 mg Intravenous Q8H  . [DISCONTINUED]  pantoprazole (PROTONIX) IV  40 mg Intravenous QHS  . [DISCONTINUED] sodium phosphate  Dextrose 5% IVPB  30 mmol Intravenous Once  . [DISCONTINUED] vancomycin  125 mg Oral Q6H      . sodium chloride 15 mL/hr (06/12/12 1100)  . sodium chloride 5 mL/hr at 06/13/12 0600  . sodium chloride    . heparin 700 Units/hr (06/15/12 0600)  . [DISCONTINUED] norepinephrine (LEVOPHED) Adult infusion Stopped (06/13/12 1236)    Labs:  Basename 06/15/12 0355 06/14/12 0430  NA 134* 137  K 3.8 3.3*  CL 97 94*  CO2 29 32  GLUCOSE 87 128*  BUN 18 24*  CREATININE 0.77 0.93  CALCIUM 8.2* 8.3*  MG 2.0 2.1  PHOS 2.0* 2.4    Basename 06/14/12 0430 06/12/12 1012  AST 35 113*  ALT 59* 120*  ALKPHOS 78 97  BILITOT 1.2 1.0  PROT 5.8* 6.1  ALBUMIN 2.7* 3.2*     Basename 06/15/12 0355 06/14/12 0430  WBC 6.8 8.4  NEUTROABS -- --  HGB 11.2* 11.6*  HCT 33.7* 34.0*  MCV 95.7 95.5  PLT 158 159    Basename 06/12/12 2110  CKTOTAL --  CKMB --  TROPONINI <0.30     Basename 06/15/12 0355 06/13/12 0848  CHOL -- 105  HDL -- 41  LDLCALC -- 46  TRIG 85 --  CHOLHDL -- 2.6    Basename 06/12/12 1012    TSH 0.537  T4TOTAL --  T3FREE --  THYROIDAB --    Radiology/Studies: Dg Chest Port 1 View  06/14/2012  *RADIOLOGY REPORT*  Clinical Data: Ventilator, shortness of breath.  PORTABLE CHEST - 1 VIEW  Comparison: 06/13/2012  Findings: Support devices including endotracheal tube are unchanged.  There is left base atelectasis or infiltrate, stable. Right lung is clear.  Heart is mildly enlarged.  No visible effusions.  No acute bony abnormality.  IMPRESSION: Stable left base atelectasis or infiltrate.  Mild cardiomegaly.   Original Report Authenticated By: Kevin Dover, M.D.    Dg Chest Port 1 View  06/13/2012  *RADIOLOGY REPORT*  Clinical Data: Ventilator.  PORTABLE CHEST - 1 VIEW  Comparison: 06/12/2012  Findings: Endotracheal tube and NG tube remain in place, unchanged. Left base atelectasis or  infiltrate, improved since prior study. Right lung is clear.  Heart is borderline in size.  No acute bony abnormality.  IMPRESSION: Decreasing left base atelectasis or infiltrate.   Original Report Authenticated By: Kevin Dover, M.D.    Portable Chest Xray To Verify Ett/ Central Line Placement  06/12/2012  *RADIOLOGY REPORT*  Clinical Data: ETT placement.  PORTABLE CHEST - 1 VIEW  Comparison: Chest x-ray 06/12/2012.  Findings: An endotracheal tube is in place with tip 4.3 cm above the carina. A nasogastric tube is seen extending into the stomach, however, the tip of the nasogastric tube extends below the lower margin of the image.  Compared to the prior examination there is now complete opacification of the base of the left hemithorax and slight shift of structures toward the left hemithorax, suggestive of complete left lower lobe atelectasis.  Lungs are otherwise clear.  No definite pleural effusions.  No evidence of pulmonary edema.  Mild cardiomegaly is unchanged.  Atherosclerosis of the thoracic aorta.  IMPRESSION: 1.  Support apparatus, as above. 2.  Interval development of complete left lower lobe atelectasis. Correlation for signs and symptoms of aspiration or mucous plugging is recommended.  These results were called by telephone on 06/12/2012 at 10:40 a.m. to Dr. Yacoub, who verbally acknowledged these results.   Original Report Authenticated By: Daniel Entrikin, M.D.    Dg Chest Port 1 View  06/12/2012  *RADIOLOGY REPORT*  Clinical Data: Nausea, vomiting.  Post intubation.  PORTABLE CHEST - 1 VIEW  Comparison: 10/28/2004  Findings: Endotracheal tube is 4 cm above the carina.  There is cardiomegaly.  No confluent airspace opacities or effusions.  No acute bony abnormality.  IMPRESSION: Endotracheal tube 4 cm above the carina.  Cardiomegaly.   Original Report Authenticated By: Kevin Dover, M.D.    Dg Abd Portable 1v  06/12/2012  *RADIOLOGY REPORT*  Clinical Data: Abdominal pain  PORTABLE ABDOMEN  - 1 VIEW  Comparison: None.  Findings: NG tube tip projects over the antrum of the stomach.  No disproportionate dilatation of small bowel.  Right femoral vascular catheter is in place.  No obvious free intraperitoneal gas.  IMPRESSION: Nonobstructive bowel gas pattern.  NG tube.   Original Report Authenticated By: Arthur Hoss, M.D.    Echocardiogram:06/12/12  LVEF 25-30%, diffuse hypokinesis. Moderate AR and MR  Telemetry: Afib with PVCs   Assessment and Plan   Asystole cardiac arrest in the setting of hyperkalemia of K 7.7 which likely associated with her admitting s/s of nausea, abdominal discomfort and diarrhea. Her Troponin are negative x 2 in ED with EKG. BNP elevated (actually trending down comparing to one week ago at 25, 000). NO signs of fluid overload. Echo   shows LVEF 25-30% >> Last LVEF 40% on 06/05/12. LDL 46, HB A1C 5.6 (06/04/12)   1. Cardiac arrest, Asystole.      Extubated 06/14/12. off all pressors and sedation meds. - On Afib with controlled HR. Frequent PVCs.     - on Heparin gtt and off coumadin - Cardiac cath today ( R & L cath)  - Beta blocker started - Consider to add ACEI if BP tolerates  - Her Digoxin level was 1.8 on admission which was > 24 hours after her last dose. I suspect that she had Digoxin toxicity which can also contributes to her GI symptoms. We will likely half her home dose if needed upon discharge.   2. Cardiomyopathy with recent hx of acute systolic CHF on 06/04/12   3. Acute respiratory failure, ? HCAP, ? Aspiration LLL collapse, extubated. PCCM manage   4. Sepsis, pre PCCM  - Pancultures, C-diff pending  - IV Vanc, Zosyn  - off Flagyl and oral Vanc    5. Hypokalemia and hypomagnesemia, resolved. PCCM   6. Acute renal failure and hyperkalemia, resolved,PCCM manage   7. Elevated transaminase level, resolving, likely associated with Cardiac arrest.   8. Elevated D-Dimer, INR is therapeutic but she was immobile while previously hospitalized, did  have both abdominal pain and leg pain.  -Well score low probability. Could related to her hospital visit, No right heart strains on EKG.   9. Chronic A fib on coumadin,  Coumadin on hold and Heparin gtt bridging to cath.   10. HTN.  Code status: Full code   VTE: Heparin    Signed, LI, NA PGY-2 6:56 AM  Patient seen with resident, agree with the above note. Patient doing well this morning.  Good BP, does not appear volume overloaded. HR around 100, atrial fibrillation (chronic).  - LHC/RHC today given drop in EF.  She is off coumadin and on heparin gtt.  - Increase Coreg to 6.25 mg bid.  - Add lisinopril 2.5 mg bid.  - Would not use digoxin in the future in this patient.   Trini Christiansen 06/15/2012 8:12 AM  

## 2012-06-15 NOTE — Progress Notes (Signed)
Nutrition Follow-up  Intervention:   1. Ensure Complete po BID, each supplement provides 350 kcal and 13 grams of protein. Once diet advanced post cath   2. RD will continue to follow    Assessment:   Pt was extubated on 11/13. Planned for cath lab today.  RD consulted for pt with "severe malnutrition."  Pt reports poor po intake, mostly only eating one meal daily.  Weight hx shows pt with 1 lb weight loss in the past 4 months. States the most she has ever weighed is about 125 lbs when she was pregnant.   Some mild wasting noted at the temples. Likely poor intake leading to muscle loss but has been able to maintain weight.   Pt meets criteria for moderate malnutrition in the context of chronic illness 2/2 meeting <75% estimated nutrition needs for >/=1 month and mild muscle wasting.   Diet Order:  NPO for cath, previously Carb Mod medium.  No intake at this time.   Meds: Scheduled Meds:   . aspirin  324 mg Oral Pre-Cath  . carvedilol  6.25 mg Oral BID WC  . [COMPLETED] dextrose      . diazepam  2 mg Oral On Call  . [COMPLETED] heparin  1,350 Units Intravenous Once  . insulin aspart  2-6 Units Subcutaneous Q4H  . lisinopril  2.5 mg Oral BID  . multivitamin  5 mL Per Tube Daily  . pantoprazole  40 mg Oral Q1200  . piperacillin-tazobactam (ZOSYN)  IV  3.375 g Intravenous Q8H  . [COMPLETED] potassium chloride  40 mEq Oral Once  . sodium chloride  3 mL Intravenous Q12H  . sodium glycerophosphate 0.9% NaCl IVPB  30 mmol Intravenous Once  . vancomycin  500 mg Intravenous Q24H  . [DISCONTINUED] antiseptic oral rinse  15 mL Mouth Rinse QID  . [DISCONTINUED] carvedilol  3.125 mg Oral BID  . [DISCONTINUED] chlorhexidine  15 mL Mouth Rinse BID  . [DISCONTINUED] feeding supplement (JEVITY 1.2 CAL)  1,000 mL Per Tube Q24H  . [DISCONTINUED] feeding supplement  30 mL Per Tube TID  . [DISCONTINUED] metronidazole  500 mg Intravenous Q8H  . [DISCONTINUED] pantoprazole (PROTONIX) IV  40 mg  Intravenous QHS  . [DISCONTINUED] sodium phosphate  Dextrose 5% IVPB  30 mmol Intravenous Once  . [DISCONTINUED] vancomycin  125 mg Oral Q6H   Continuous Infusions:   . sodium chloride 15 mL/hr (06/12/12 1100)  . sodium chloride 10 mL/hr (06/15/12 0800)  . sodium chloride 50 mL/hr at 06/15/12 0822  . heparin 700 Units/hr (06/15/12 0600)  . [DISCONTINUED] norepinephrine (LEVOPHED) Adult infusion Stopped (06/13/12 1236)   PRN Meds:.sodium chloride, sodium chloride, acetaminophen (TYLENOL) oral liquid 160 mg/5 mL, sodium chloride, [DISCONTINUED] fentaNYL, [DISCONTINUED] midazolam   CMP     Component Value Date/Time   NA 134* 06/15/2012 0355   K 3.8 06/15/2012 0355   CL 97 06/15/2012 0355   CO2 29 06/15/2012 0355   GLUCOSE 87 06/15/2012 0355   BUN 18 06/15/2012 0355   CREATININE 0.77 06/15/2012 0355   CALCIUM 8.2* 06/15/2012 0355   PROT 5.8* 06/14/2012 0430   ALBUMIN 2.7* 06/14/2012 0430   AST 35 06/14/2012 0430   ALT 59* 06/14/2012 0430   ALKPHOS 78 06/14/2012 0430   BILITOT 1.2 06/14/2012 0430   GFRNONAA >90 06/15/2012 0355   GFRAA >90 06/15/2012 0355    CBG (last 3)   Basename 06/15/12 0442 06/14/12 2309 06/14/12 2029  GLUCAP 84 93 78     Intake/Output Summary (  Last 24 hours) at 06/15/12 0940 Last data filed at 06/15/12 0900  Gross per 24 hour  Intake 1137.17 ml  Output    720 ml  Net 417.17 ml    Weight Status:  113 lbs. Admission weight 114 lbs.   Re-estimated needs:  1500-1700 kcal, 65-75 gm protein daily   Nutrition Dx:  Inadequate oral intake now r/t poor appetite AEB only eating one meal daily PTA   Goal:  EN goal no longer applicable.  New Goal: Diet advance to meet >/=90% estimated nutrition needs  Monitor:  Diet advance, weight trends, labs, I/O's   Clarene Duke RD, LDN Pager [phone] After Hours pager [phone]

## 2012-06-15 NOTE — Progress Notes (Signed)
SLP Cancellation Note  Patient Details Name: [name] [name] MRN: [id number] DOB: [birth year]   Cancelled swallow evaluation:     Pt NPO for cardiac cath; will return this pm if schedule permits or next date.  Livianna Petraglia L. Samson Frederic, [place] CCC/SLP Pager [phone]       Blenda Mounts Laurice 06/15/2012, 9:42 AM

## 2012-06-16 LAB — HEPARIN LEVEL (UNFRACTIONATED): Heparin Unfractionated: 0.21 IU/mL — ABNORMAL LOW (ref 0.30–0.70)

## 2012-06-16 LAB — GLUCOSE, CAPILLARY
Glucose-Capillary: 150 mg/dL — ABNORMAL HIGH (ref 70–99)
Glucose-Capillary: 158 mg/dL — ABNORMAL HIGH (ref 70–99)

## 2012-06-16 LAB — BASIC METABOLIC PANEL
BUN: 13 mg/dL (ref 6–23)
Calcium: 8.2 mg/dL — ABNORMAL LOW (ref 8.4–10.5)
GFR calc non Af Amer: >90 mL/min (ref >90)
Glucose, Bld: 76 mg/dL (ref 70–99)
Potassium: 3.5 mEq/L (ref 3.5–5.1)

## 2012-06-16 LAB — CBC
HCT: 33.2 % — ABNORMAL LOW (ref 36.0–46.0)
Hemoglobin: 11.3 g/dL — ABNORMAL LOW (ref 12.0–15.0)
MCH: 32.5 pg (ref 26.0–34.0)
MCHC: 34 g/dL (ref 30.0–36.0)

## 2012-06-16 LAB — PROTIME-INR: Prothrombin Time: 17.7 seconds — ABNORMAL HIGH (ref 11.6–15.2)

## 2012-06-16 MED ORDER — WARFARIN SODIUM 5 MG PO TABS
5.0000 mg | ORAL_TABLET | Freq: Once | ORAL | Status: AC
  Administered 2012-06-16: 5 mg via ORAL
  Filled 2012-06-16: qty 1

## 2012-06-16 MED ORDER — HEPARIN (PORCINE) IN NACL 100-0.45 UNIT/ML-% IJ SOLN
1050.0000 [IU]/h | INTRAMUSCULAR | Status: DC
  Filled 2012-06-16 (×5): qty 250

## 2012-06-16 MED ORDER — AMIODARONE LOAD VIA INFUSION
150.0000 mg | Freq: Once | INTRAVENOUS | Status: AC
  Administered 2012-06-16: 150 mg via INTRAVENOUS
  Filled 2012-06-16: qty 83.34

## 2012-06-16 MED ORDER — POTASSIUM CHLORIDE 20 MEQ PO PACK
40.0000 meq | PACK | Freq: Once | ORAL | Status: DC
Start: 2012-06-16 — End: 2012-06-16
  Filled 2012-06-16: qty 2

## 2012-06-16 MED ORDER — COUMADIN BOOK
Freq: Once | Status: AC
  Administered 2012-06-16: 10:00:00
  Filled 2012-06-16: qty 1

## 2012-06-16 MED ORDER — AMIODARONE HCL IN DEXTROSE 360-4.14 MG/200ML-% IV SOLN
30.0000 mg/h | INTRAVENOUS | Status: DC
  Administered 2012-06-16 – 2012-06-17 (×4): 30 mg/h via INTRAVENOUS
  Filled 2012-06-16 (×9): qty 200

## 2012-06-16 MED ORDER — AMIODARONE HCL IN DEXTROSE 360-4.14 MG/200ML-% IV SOLN
60.0000 mg/h | INTRAVENOUS | Status: AC
  Administered 2012-06-16 (×2): 60 mg/h via INTRAVENOUS
  Filled 2012-06-16: qty 200

## 2012-06-16 MED ORDER — HEPARIN BOLUS VIA INFUSION
2000.0000 [IU] | Freq: Once | INTRAVENOUS | Status: AC
  Administered 2012-06-16: 2000 [IU] via INTRAVENOUS
  Filled 2012-06-16: qty 2000

## 2012-06-16 MED ORDER — LISINOPRIL 5 MG PO TABS
5.0000 mg | ORAL_TABLET | Freq: Two times a day (BID) | ORAL | Status: DC
  Administered 2012-06-16 – 2012-06-21 (×12): 5 mg via ORAL
  Filled 2012-06-16 (×18): qty 1

## 2012-06-16 MED ORDER — POTASSIUM CHLORIDE CRYS ER 20 MEQ PO TBCR
40.0000 meq | EXTENDED_RELEASE_TABLET | Freq: Once | ORAL | Status: AC
  Administered 2012-06-16: 40 meq via ORAL

## 2012-06-16 MED ORDER — ADULT MULTIVITAMIN W/MINERALS CH
1.0000 | ORAL_TABLET | Freq: Every day | ORAL | Status: DC
  Administered 2012-06-16 – 2012-06-28 (×12): 1 via ORAL
  Filled 2012-06-16 (×13): qty 1

## 2012-06-16 MED ORDER — WARFARIN VIDEO
Freq: Once | Status: AC
  Administered 2012-06-16: 12:00:00

## 2012-06-16 MED ORDER — HEPARIN (PORCINE) IN NACL 100-0.45 UNIT/ML-% IJ SOLN
800.0000 [IU]/h | INTRAMUSCULAR | Status: DC
  Administered 2012-06-16: 800 [IU]/h via INTRAVENOUS
  Filled 2012-06-16: qty 250

## 2012-06-16 NOTE — CV Procedure (Deleted)
Procedure: TEE  Indication: CVA  Sedation: Versed 1 mg IV, 25 mcg IV Fentanyl  Findings: Please see report in echo section for full details.  Normal LV size with moderate LV hypertrophy.  EF 55%.  No LAA thrombus.  There was a small PFO with bubbles crossing at rest.  There was extensive up to grade IV aortic atherosclerosis noted from the arch into the descending thoracic aorta.   No complications.   [name] [name] 06/16/2012 10:33 AM

## 2012-06-16 NOTE — Progress Notes (Signed)
ANTICOAGULATION CONSULT NOTE - Follow Up Consult  Pharmacy Consult for Coumadin Indication: atrial fibrillation  No Known Allergies  Patient Measurements: Height: 5' (152.4 cm) Weight: 111 lb 14.4 oz (50.758 kg) IBW/kg (Calculated) : 45.5  Heparin Dosing Weight:   Vital Signs: Temp: 97.8 F (36.6 C) (11/15 0752) Temp src: Oral (11/15 0752) BP: 132/79 mmHg (11/15 0752) Pulse Rate: 66  (11/15 0752)  Labs:  Basename 06/16/12 0510 06/15/12 1136 06/15/12 0355 06/14/12 1640 06/14/12 0430  HGB 11.3* -- 11.2* -- --  HCT 33.2* -- 33.7* -- 34.0*  PLT 188 -- 158 -- 159  APTT -- -- -- -- --  LABPROT 17.7* -- 18.2* -- 18.5*  INR 1.50* -- 1.56* -- 1.59*  HEPARINUNFRC -- 0.17* 0.15* 1.18* --  CREATININE 0.65 -- 0.77 -- 0.93  CKTOTAL -- -- -- -- --  CKMB -- -- -- -- --  TROPONINI -- -- -- -- --    Estimated Creatinine Clearance: 55.7 ml/min (by C-G formula based on Cr of 0.65).   Medications:  Scheduled:    . ampicillin-sulbactam (UNASYN) IV  1.5 g Intravenous Q6H  . [COMPLETED] aspirin  324 mg Oral Pre-Cath  . carvedilol  6.25 mg Oral BID WC  . [COMPLETED] diazepam  2 mg Oral On Call  . [COMPLETED] fentaNYL      . [COMPLETED] heparin      . insulin aspart  2-6 Units Subcutaneous Q4H  . [COMPLETED] lidocaine      . lisinopril  2.5 mg Oral BID  . [COMPLETED] midazolam      . multivitamin  5 mL Per Tube Daily  . [COMPLETED] nitroGLYCERIN      . pantoprazole  40 mg Oral Q1200  . [COMPLETED] sodium glycerophosphate 0.9% NaCl IVPB  30 mmol Intravenous Once  . [COMPLETED] warfarin  5 mg Oral ONCE-1800  . Warfarin - Pharmacist Dosing Inpatient   Does not apply q1800  . [DISCONTINUED] piperacillin-tazobactam (ZOSYN)  IV  3.375 g Intravenous Q8H  . [DISCONTINUED] sodium chloride  3 mL Intravenous Q12H  . [DISCONTINUED] vancomycin  500 mg Intravenous Q24H    Assessment: 56yo female with AFib, s/p arrest 2nd hyperkalemia with clean cath last PM & Coumadin now resumed.  INR 1.5  this AM with no bleeding problems.  Goal of Therapy:  INR 2-3 Monitor platelets by anticoagulation protocol: Yes   Plan:  1.  Repeat Coumadin 5mg  today 2.  Continue daily INR  Marisue Humble, PharmD Clinical Pharmacist [facility]

## 2012-06-16 NOTE — Evaluation (Signed)
Physical Therapy Evaluation Patient Details Name: [name] [name] MRN: [id number] DOB: 1956-06-08 Today's Date: 06/16/2012 Time: 0940-1001 PT Time Calculation (min): 21 min  PT Assessment / Plan / Recommendation Clinical Impression  56 yo female with hx COPD (alpha-1), HTN, DM, Afib on coumadin, ?thyroid disease presented 11/11 to [facility] er with c/o abd pain, nausea, vomiting and diarrhea.  Just d/c 11/8 after an admission for decompensated CHF requiring intubation and prolonged ICU stay.  While being evaluated in ER 11/11 she became unresponsive, asystolic.  She was intubated and HR returned. Found to be hyperkalemic with K=7.7.  She cont to have issues with bradycardia and intermittent VT.  She followed commands post arrest and received insulin, D50 for hyperkalemia and tx to [facility]. ETT 11/11->11/13.   PT eval limited today  because of uncontrolled A-fib (pt performed SPT bed->3in1 and HR increased to 160s). Pt reports generalized weakness and is slightly unsteady from what I saw today. Family is also concerned about her returning home alone. At this point recommend ST-SNF unless pt able to progress to modified independent level. Will progress as HR is more controlled.     PT Assessment  Patient needs continued PT services    Follow Up Recommendations  SNF    Does the patient have the potential to tolerate intense rehabilitation      Barriers to Discharge Decreased caregiver support      Equipment Recommendations  None recommended by PT    Recommendations for Other Services     Frequency Min 3X/week    Precautions / Restrictions     Pertinent Vitals/Pain Spoke with RN who reports OK for [place] to start session, A-fib 100-120s initially but jumped up to 160s so PT session terminated.       Mobility  Bed Mobility Bed Mobility: Supine to Sit Supine to Sit: 5: Supervision Details for Bed Mobility Assistance: pt sat up and sat EOB about 5 minutes however HR increased to 160 so our  session was terminated Transfers Transfers: Editor, commissioning Transfers: 4: Min assist Details for Transfer Assistance: minA for stability for pt to transfer bed->3in1, let pt sitting on 3in1 with call bell within reach Ambulation/Gait Ambulation/Gait Assistance: Not tested (comment) Ambulation/Gait Assistance Details: pt with uncontrolled A-fib              PT Diagnosis: Generalized weakness  PT Problem List: Decreased strength;Decreased activity tolerance;Decreased balance PT Treatment Interventions: Gait training;Functional mobility training;Stair training;Therapeutic activities;Therapeutic exercise;Balance training;Neuromuscular re-education;Patient/family education   PT Goals Acute Rehab PT Goals PT Goal Formulation: With patient Time For Goal Achievement: 06/23/12 Potential to Achieve Goals: Good Pt will go Supine/Side to Sit: with modified independence PT Goal: Supine/Side to Sit - Progress: Goal set today Pt will go Sit to Stand: with modified independence PT Goal: Sit to Stand - Progress: Goal set today Pt will go Stand to Sit: with modified independence PT Goal: Stand to Sit - Progress: Goal set today Pt will Transfer Bed to Chair/Chair to Bed: with modified independence PT Transfer Goal: Bed to Chair/Chair to Bed - Progress: Goal set today Pt will Ambulate: >150 feet;with modified independence;with least restrictive assistive device PT Goal: Ambulate - Progress: Goal set today Pt will Go Up / Down Stairs: 3-5 stairs;with modified independence PT Goal: Up/Down Stairs - Progress: Goal set today Pt will Perform Home Exercise Program: Independently PT Goal: Perform Home Exercise Program - Progress: Goal set today  Visit Information  Last PT Received On: 06/16/12 Assistance  Needed: +1    Subjective Data      Prior Functioning  Home Living Lives With: Alone Available Help at Discharge: Family;Available PRN/intermittently Type of Home: Mobile  home Home Access: Stairs to enter Entrance Stairs-Number of Steps: 5 Entrance Stairs-Rails: Right;Left;Can reach both Home Layout: One level Bathroom Shower/Tub: Engineer, manufacturing systems: Standard Home Adaptive Equipment: None Prior Function Level of Independence: Independent Able to Take Stairs?: Yes Driving: Yes Comments: hasn't been employed recently but has worked at [employer]: No difficulties    Cognition  Overall Cognitive Status: Appears within functional limits for tasks assessed/performed Arousal/Alertness: Awake/alert Orientation Level: Appears intact for tasks assessed Behavior During Session: [facility] for tasks performed Cognition - Other Comments: appears a little slower with processing?    Extremity/Trunk Assessment Right Upper Extremity Assessment RUE ROM/Strength/Tone: [facility] for tasks assessed Left Upper Extremity Assessment LUE ROM/Strength/Tone: WFL for tasks assessed Right Lower Extremity Assessment RLE ROM/Strength/Tone: [facility] for tasks assessed Left Lower Extremity Assessment LLE ROM/Strength/Tone: WFL for tasks assessed   Balance Balance Balance Assessed: Yes  End of Session PT - End of Session Equipment Utilized During Treatment: Gait belt Activity Tolerance: Treatment limited secondary to medical complications (Comment) (A-fib, rates up  to 160s) Patient left: in chair (pt sitting on 3in1) Nurse Communication: Mobility status (nursing tech aware pt position)  GP     [facility] HELEN 06/16/2012, 12:48 PM

## 2012-06-16 NOTE — Progress Notes (Signed)
ANTICOAGULATION CONSULT NOTE - Follow Up Consult  Pharmacy Consult for Heparin Indication: atrial fibrillation  No Known Allergies  Patient Measurements: Height: 5' (152.4 cm) Weight: 111 lb 14.4 oz (50.758 kg) IBW/kg (Calculated) : 45.5  Heparin Dosing Weight: 50  Vital Signs: Temp: 97.8 F (36.6 C) (11/15 0752) Temp src: Oral (11/15 0752) BP: 132/79 mmHg (11/15 0752) Pulse Rate: 66  (11/15 0752)  Labs:  Basename 06/16/12 0510 06/15/12 1136 06/15/12 0355 06/14/12 1640 06/14/12 0430  HGB 11.3* -- 11.2* -- --  HCT 33.2* -- 33.7* -- 34.0*  PLT 188 -- 158 -- 159  APTT -- -- -- -- --  LABPROT 17.7* -- 18.2* -- 18.5*  INR 1.50* -- 1.56* -- 1.59*  HEPARINUNFRC -- 0.17* 0.15* 1.18* --  CREATININE 0.65 -- 0.77 -- 0.93  CKTOTAL -- -- -- -- --  CKMB -- -- -- -- --  TROPONINI -- -- -- -- --    Estimated Creatinine Clearance: 55.7 ml/min (by C-G formula based on Cr of 0.65).   Assessment: 56yo female now s/p TEE, to resume Heparin.  Pt was previously subtherapeutic on 700 units/hr & stopped yesterday afternoon.  INR 1.5 this AM.  No problems noted.  Goal of Therapy:  Heparin level 0.3-0.7 units/ml Monitor platelets by anticoagulation protocol: Yes   Plan:  1.  Resume heparin 2000 units IV x 1, 800 units/hr 2.  Check heparin level 8hr  Marisue Humble, PharmD Clinical Pharmacist [facility]

## 2012-06-16 NOTE — Progress Notes (Signed)
Patient ID: [name] [name], female   DOB: 01-18-1956, 56 y.o.   MRN: [id number]    SUBJECTIVE: No complaints this morning but HR high, up to 140s atrial fibrillation.  She has had no meds this morning. Cath yesterday with no obstructive CAD.       Marland Kitchen amiodarone  150 mg Intravenous Once  . ampicillin-sulbactam (UNASYN) IV  1.5 g Intravenous Q6H  . [COMPLETED] aspirin  324 mg Oral Pre-Cath  . carvedilol  6.25 mg Oral BID WC  . coumadin book   Does not apply Once  . [COMPLETED] diazepam  2 mg Oral On Call  . [COMPLETED] fentaNYL      . [COMPLETED] heparin      . insulin aspart  2-6 Units Subcutaneous Q4H  . [COMPLETED] lidocaine      . lisinopril  5 mg Oral BID  . [COMPLETED] midazolam      . multivitamin with minerals  1 tablet Oral Daily  . [COMPLETED] nitroGLYCERIN      . pantoprazole  40 mg Oral Q1200  . [COMPLETED] sodium glycerophosphate 0.9% NaCl IVPB  30 mmol Intravenous Once  . [COMPLETED] warfarin  5 mg Oral ONCE-1800  . warfarin  5 mg Oral ONCE-1800  . warfarin   Does not apply Once  . Warfarin - Pharmacist Dosing Inpatient   Does not apply q1800  . [DISCONTINUED] lisinopril  2.5 mg Oral BID  . [DISCONTINUED] multivitamin  5 mL Per Tube Daily  . [DISCONTINUED] piperacillin-tazobactam (ZOSYN)  IV  3.375 g Intravenous Q8H  . [DISCONTINUED] sodium chloride  3 mL Intravenous Q12H  . [DISCONTINUED] vancomycin  500 mg Intravenous Q24H      Filed Vitals:   06/15/12 2142 06/16/12 0419 06/16/12 0500 06/16/12 0752  BP: 145/83 148/65  132/79  Pulse: 110 101  66  Temp: 98.3 F (36.8 C) 97.6 F (36.4 C)  97.8 F (36.6 C)  TempSrc: Oral Oral  Oral  Resp: 18 17  17   Height:      Weight:   111 lb 14.4 oz (50.758 kg)   SpO2: 94% 94%  93%    Intake/Output Summary (Last 24 hours) at 06/16/12 1036 Last data filed at 06/16/12 0500  Gross per 24 hour  Intake 136.57 ml  Output   1000 ml  Net -863.43 ml    LABS: Basic Metabolic Panel:  Basename 06/16/12 0510 06/15/12 0355    NA 133* 134*  K 3.5 3.8  CL 99 97  CO2 23 29  GLUCOSE 76 87  BUN 13 18  CREATININE 0.65 0.77  CALCIUM 8.2* 8.2*  MG 1.7 2.0  PHOS 2.6 2.0*   Liver Function Tests:  Basename 06/14/12 0430  AST 35  ALT 59*  ALKPHOS 78  BILITOT 1.2  PROT 5.8*  ALBUMIN 2.7*   No results found for this basename: LIPASE:2,AMYLASE:2 in the last 72 hours CBC:  Basename 06/16/12 0510 06/15/12 0355  WBC 7.5 6.8  NEUTROABS -- --  HGB 11.3* 11.2*  HCT 33.2* 33.7*  MCV 95.4 95.7  PLT 188 158   Cardiac Enzymes: No results found for this basename: CKTOTAL:3,CKMB:3,CKMBINDEX:3,TROPONINI:3 in the last 72 hours BNP: No components found with this basename: POCBNP:3 D-Dimer: No results found for this basename: DDIMER:2 in the last 72 hours Hemoglobin A1C: No results found for this basename: HGBA1C in the last 72 hours Fasting Lipid Panel:  Basename 06/15/12 0355  CHOL --  HDL --  LDLCALC --  TRIG 85  CHOLHDL --  LDLDIRECT --   Thyroid Function Tests: No results found for this basename: TSH,T4TOTAL,FREET3,T3FREE,THYROIDAB in the last 72 hours Anemia Panel: No results found for this basename: VITAMINB12,FOLATE,FERRITIN,TIBC,IRON,RETICCTPCT in the last 72 hours  RADIOLOGY: Dg Chest Port 1 View  06/14/2012  *RADIOLOGY REPORT*  Clinical Data: Ventilator, shortness of breath.  PORTABLE CHEST - 1 VIEW  Comparison: 06/13/2012  Findings: Support devices including endotracheal tube are unchanged.  There is left base atelectasis or infiltrate, stable. Right lung is clear.  Heart is mildly enlarged.  No visible effusions.  No acute bony abnormality.  IMPRESSION: Stable left base atelectasis or infiltrate.  Mild cardiomegaly.   Original Report Authenticated By: Charlett Nose, M.D.    PHYSICAL EXAM General: NAD, frail Neck: No JVD, no thyromegaly or thyroid nodule.  Lungs: Slight decreased breath sounds at bases bilaterally.  CV: Nondisplaced PMI.  Heart regular S1/S2, no S3/S4, 1/6 HSM LLSB.  No  peripheral edema.  No carotid bruit.  Normal pedal pulses.  Abdomen: Soft, nontender, no hepatosplenomegaly, no distention.  Neurologic: Alert and oriented x 3.  Psych: Normal affect. Extremities: No clubbing or cyanosis.   TELEMETRY: Reviewed telemetry pt in atrial fibrillation with RVR in 140s  ASSESSMENT AND PLAN: 1. Cardiomyopathy: Nonischemic, cath yesterday with minimal CAD.  ? Tachycardia-mediated.   - Continue Coreg.  - Increase lisinopril to 5 mg bid.  - Stop IV fluid.  2. Atrial fibrillation: With RVR.  Has history of PAF.  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  I will continue Coreg for now and will start amiodarone with bolus and gtt today for rate control.  I will start her on heparin gtt given subtherapeutic INR.  If she remains in atrial fibrillation Monday, plan TEE-guided DCCV.  3. Cardiac arrest: Asystole in the setting of hyperkalemia and high digoxin level.  She is off digoxin.  4. ? Aspiration PNA: She in on Unasyn.  Needs Unasyn or Augmentin until 11/18.  5. Swallow study today.  6. When HR controlled, needs PT.  Will likely need rehab when ready for discharge.   [name] 06/16/2012 10:43 AM

## 2012-06-16 NOTE — Evaluation (Signed)
Clinical/Bedside Swallow Evaluation Patient Details  Name: [name] [name] MRN: [id number] Date of Birth: 05/11/1956  Today's Date: 06/16/2012 Time: 1000-1030 SLP Time Calculation (min): 30 min  Past Medical History:  Past Medical History  Diagnosis Date  . Paroxysmal supraventricular tachycardia     Lost to followup-2006  . Atrial fibrillation     Echocardiogram in 2004-borderline LV function; no valvular abnormalities  . Right bundle branch block   . Anxiety   . Anemia     2011: Hemoglobin of 11.4; hematocrit of 33.5; normal MCV   Past Surgical History:  Past Surgical History  Procedure Date  . Breast excisional biopsy     Left x2   HPI:      Assessment / Plan / Recommendation Clinical Impression  Patient appears to have good swallow function, with no overt s/s of aspiration with any consistency presented.  Patient was able to chew a [place] sandwich without difficulty, had timely swallow initiation, and good laryngeal elevation ws palpated with each swallow.  Patient drank consecutive swallows of water via straw without difficulty.  Clinically, patient appears to have a normal swallow, and the family reports there was an episode of aspiration that occured with intubation, as patient vomited and aspiratated emesis.    Aspiration Risk  None    Diet Recommendation Regular;Thin liquid   Liquid Administration via: Straw;Cup Medication Administration: Whole meds with liquid Supervision: Patient able to self feed;Intermittent supervision to cue for compensatory strategies Compensations: Slow rate;Small sips/bites Postural Changes and/or Swallow Maneuvers: Seated upright 90 degrees    Other  Recommendations Oral Care Recommendations: Oral care QID Other Recommendations: Clarify dietary restrictions (Heart healthy; RN reports glucose levels have been WNL)   Follow Up Recommendations  None    Frequency and Duration min 1 x/week  1 week   Pertinent Vitals/Pain N/A    SLP  Swallow Goals Patient will consume recommended diet without observed clinical signs of aspiration with: Independent assistance;Set-up   Swallow Study Prior Functional Status       General Type of Study: Bedside swallow evaluation Previous Swallow Assessment: N/A Diet Prior to this Study: NPO Temperature Spikes Noted: No Respiratory Status: Supplemental O2 delivered via (comment) History of Recent Intubation: Yes Length of Intubations (days): 2 days Date extubated: 06/14/12 Behavior/Cognition: Alert;Cooperative;Pleasant mood Oral Cavity - Dentition: Adequate natural dentition Self-Feeding Abilities: Able to feed self Patient Positioning: Upright in bed Baseline Vocal Quality: Clear Volitional Cough: Strong Volitional Swallow: Able to elicit    Oral/Motor/Sensory Function Overall Oral Motor/Sensory Function: Appears within functional limits for tasks assessed   Ice Chips Ice chips: Not tested   Thin Liquid Thin Liquid: Within functional limits Presentation: Cup;Straw;Self Fed    Nectar Thick Nectar Thick Liquid: Not tested   Honey Thick Honey Thick Liquid: Not tested   Puree Puree: Within functional limits Presentation: Self Fed;Spoon   Solid   GO    Solid: Within functional limits Presentation: Self Daine Gravel, Deng Kemler T 06/16/2012,10:44 AM

## 2012-06-16 NOTE — Progress Notes (Signed)
ANTICOAGULATION CONSULT NOTE - Follow Up Consult  Pharmacy Consult for Heparin Indication: atrial fibrillation  No Known Allergies  Patient Measurements: Height: 5' (152.4 cm) Weight: 111 lb 14.4 oz (50.758 kg) IBW/kg (Calculated) : 45.5  Heparin Dosing Weight: 50  Vital Signs: Temp: 97.8 F (36.6 C) (11/15 2007) Temp src: Oral (11/15 2007) BP: 111/76 mmHg (11/15 2007) Pulse Rate: 95  (11/15 2007)  Labs:  Basename 06/16/12 2036 06/16/12 0510 06/15/12 1136 06/15/12 0355 06/14/12 0430  HGB -- 11.3* -- 11.2* --  HCT -- 33.2* -- 33.7* 34.0*  PLT -- 188 -- 158 159  APTT -- -- -- -- --  LABPROT -- 17.7* -- 18.2* 18.5*  INR -- 1.50* -- 1.56* 1.59*  HEPARINUNFRC 0.21* -- 0.17* 0.15* --  CREATININE -- 0.65 -- 0.77 0.93  CKTOTAL -- -- -- -- --  CKMB -- -- -- -- --  TROPONINI -- -- -- -- --    Estimated Creatinine Clearance: 55.7 ml/min (by C-G formula based on Cr of 0.65).   Assessment: 56 yo female now s/p TEE, on IV Heparin.  Level subtherapeutic on 800 units/hr.  Heparin resumed today, may need DCCV Monday.   INR 1.5 this AM.  No problems noted with IV per RN.  Goal of Therapy:  Heparin level 0.3-0.7 units/ml Monitor platelets by anticoagulation protocol: Yes   Plan:  1.  Increase IV heparin to 900 units/hr. 2. F/U heparin level with AM labs.

## 2012-06-17 LAB — BASIC METABOLIC PANEL
BUN: 10 mg/dL (ref 6–23)
CO2: 26 mEq/L (ref 19–32)
Calcium: 8.2 mg/dL — ABNORMAL LOW (ref 8.4–10.5)
Creatinine, Ser: 0.66 mg/dL (ref 0.50–1.10)
GFR calc non Af Amer: >90 mL/min (ref >90)
Glucose, Bld: 109 mg/dL — ABNORMAL HIGH (ref 70–99)

## 2012-06-17 LAB — CULTURE, BLOOD (ROUTINE X 2)

## 2012-06-17 LAB — GLUCOSE, CAPILLARY
Glucose-Capillary: 114 mg/dL — ABNORMAL HIGH (ref 70–99)
Glucose-Capillary: 118 mg/dL — ABNORMAL HIGH (ref 70–99)
Glucose-Capillary: 131 mg/dL — ABNORMAL HIGH (ref 70–99)
Glucose-Capillary: 142 mg/dL — ABNORMAL HIGH (ref 70–99)
Glucose-Capillary: 90 mg/dL (ref 70–99)

## 2012-06-17 LAB — CBC
MCH: 32.1 pg (ref 26.0–34.0)
MCV: 93.8 fL (ref 78.0–100.0)
Platelets: 160 10*3/uL (ref 150–400)
RBC: 3.08 MIL/uL — ABNORMAL LOW (ref 3.87–5.11)
RDW: 14.3 % (ref 11.5–15.5)

## 2012-06-17 LAB — PROTIME-INR: Prothrombin Time: 18.9 seconds — ABNORMAL HIGH (ref 11.6–15.2)

## 2012-06-17 LAB — HEPARIN LEVEL (UNFRACTIONATED): Heparin Unfractionated: 0.21 IU/mL — ABNORMAL LOW (ref 0.30–0.70)

## 2012-06-17 MED ORDER — POTASSIUM CHLORIDE CRYS ER 20 MEQ PO TBCR
40.0000 meq | EXTENDED_RELEASE_TABLET | Freq: Once | ORAL | Status: AC
  Administered 2012-06-17: 40 meq via ORAL
  Filled 2012-06-17: qty 2

## 2012-06-17 MED ORDER — METOPROLOL TARTRATE 25 MG PO TABS
25.0000 mg | ORAL_TABLET | Freq: Two times a day (BID) | ORAL | Status: DC
  Administered 2012-06-17 – 2012-06-18 (×4): 25 mg via ORAL
  Filled 2012-06-17 (×6): qty 1

## 2012-06-17 MED ORDER — METOPROLOL TARTRATE 25 MG PO TABS
25.0000 mg | ORAL_TABLET | Freq: Two times a day (BID) | ORAL | Status: DC
  Filled 2012-06-17 (×2): qty 1

## 2012-06-17 MED ORDER — WARFARIN SODIUM 7.5 MG PO TABS
7.5000 mg | ORAL_TABLET | Freq: Once | ORAL | Status: AC
  Administered 2012-06-17: 7.5 mg via ORAL
  Filled 2012-06-17: qty 1

## 2012-06-17 NOTE — Progress Notes (Signed)
ANTICOAGULATION CONSULT NOTE - Follow Up Consult  Pharmacy Consult for Heparin / Coumadin Indication: atrial fibrillation  No Known Allergies  Patient Measurements: Height: 5' (152.4 cm) Weight: 113 lb 8.6 oz (51.5 kg) IBW/kg (Calculated) : 45.5  Heparin Dosing Weight: 50  Vital Signs: Temp: 97.8 F (36.6 C) (11/16 0412) Temp src: Oral (11/16 0412) BP: 111/80 mmHg (11/16 0412) Pulse Rate: 85  (11/16 0412)  Labs:  Basename 06/17/12 0524 06/16/12 2036 06/16/12 0510 06/15/12 1136 06/15/12 0355  HGB 9.9* -- 11.3* -- --  HCT 28.9* -- 33.2* -- 33.7*  PLT 160 -- 188 -- 158  APTT -- -- -- -- --  LABPROT 18.9* -- 17.7* -- 18.2*  INR 1.64* -- 1.50* -- 1.56*  HEPARINUNFRC 0.35 0.21* -- 0.17* --  CREATININE 0.66 -- 0.65 -- 0.77  CKTOTAL -- -- -- -- --  CKMB -- -- -- -- --  TROPONINI -- -- -- -- --    Estimated Creatinine Clearance: 55.7 ml/min (by C-G formula based on Cr of 0.66).   Assessment: 56 yo female now s/p TEE, on IV Heparin and Coumadin.  INR is 1.64 after Coumadin 5mg  x 1, and heparin level (0.35) is low-end of goal range. Hgb is trending down. Per RN, no signs/symptoms of bleeding.   Goal of Therapy:  Heparin level 0.3-0.7 units/ml Monitor platelets by anticoagulation protocol: Yes   Plan:  1. Increase IV heparin to 950 units/hr to keep at-goal.  2. Heparin level in 6 hours.  3. Coumadin 7.5mg  po today. 4. Daily PT / INR.   Lorre Munroe, PharmD 06/17/12, 07:15 AM

## 2012-06-17 NOTE — Progress Notes (Signed)
Ambulated patient to door. Patient's heart rate increased to 150s; returned to 120-130s when returned to bed.  Patient noted zero pain and no discomfort.  Tolerated well.  Patient returned to bed and resting comfortably.

## 2012-06-17 NOTE — Progress Notes (Signed)
Patient ID: AZENETH CARBONELL, female   DOB: Jun 18, 1956, 56 y.o.   MRN: [id number]   Patient Name: [name] [name] Date of Encounter: 06/17/2012    SUBJECTIVE  Feeling a little stronger this morning. Denies any shortness of breath or chest pain. She does feel her heart racing at times when she gets excited or upset. She remains in atrial fibrillation at about 110-140 beats per minute. On IV amiodarone, carvedilol which he has received this morning and intravenous heparin.  CURRENT MEDS    . [COMPLETED] amiodarone  150 mg Intravenous Once  . ampicillin-sulbactam (UNASYN) IV  1.5 g Intravenous Q6H  . carvedilol  6.25 mg Oral BID WC  . [COMPLETED] coumadin book   Does not apply Once  . [COMPLETED] heparin  2,000 Units Intravenous Once  . insulin aspart  2-6 Units Subcutaneous Q4H  . lisinopril  5 mg Oral BID  . multivitamin with minerals  1 tablet Oral Daily  . pantoprazole  40 mg Oral Q1200  . [COMPLETED] potassium chloride  40 mEq Oral Once  . [COMPLETED] warfarin  5 mg Oral ONCE-1800  . warfarin  7.5 mg Oral ONCE-1800  . [COMPLETED] warfarin   Does not apply Once  . Warfarin - Pharmacist Dosing Inpatient   Does not apply q1800  . [DISCONTINUED] lisinopril  2.5 mg Oral BID  . [DISCONTINUED] potassium chloride  40 mEq Oral Once    OBJECTIVE  Filed Vitals:   06/16/12 1735 06/16/12 2007 06/17/12 0412 06/17/12 0834  BP: 106/60 111/76 111/80 125/87  Pulse:  95 85 142  Temp:  97.8 F (36.6 C) 97.8 F (36.6 C)   TempSrc:  Oral Oral   Resp:  19 18   Height:      Weight:   113 lb 8.6 oz (51.5 kg)   SpO2:  94% 96%     Intake/Output Summary (Last 24 hours) at 06/17/12 0934 Last data filed at 06/17/12 0916  Gross per 24 hour  Intake 606.76 ml  Output    300 ml  Net 306.76 ml   Filed Weights   06/15/12 0500 06/16/12 0500 06/17/12 0412  Weight: 113 lb 1.5 oz (51.3 kg) 111 lb 14.4 oz (50.758 kg) 113 lb 8.6 oz (51.5 kg)    PHYSICAL EXAM  General: Pleasant, NAD. Chronically  ill-appearing Neuro: Alert and oriented X 3. Moves all extremities spontaneously. Psych: Anxious HEENT:  Normal  Neck: Supple without bruits or JVD. Lungs:  Resp regular and unlabored, CTA. Heart: Irregular rate and rhythm which is rapid, no s3, s4, or murmurs. Abdomen: Soft, non-tender, non-distended, BS + x 4.  Extremities: No clubbing, cyanosis or edema. DP/PT/Radials 2+ and equal bilaterally.  Accessory Clinical Findings  CBC  Basename 06/17/12 0524 06/16/12 0510  WBC 6.2 7.5  NEUTROABS -- --  HGB 9.9* 11.3*  HCT 28.9* 33.2*  MCV 93.8 95.4  PLT 160 188   Basic Metabolic Panel  Basename 06/17/12 0524 06/16/12 0510 06/15/12 0355  NA 137 133* --  K 3.5 3.5 --  CL 103 99 --  CO2 26 23 --  GLUCOSE 109* 76 --  BUN 10 13 --  CREATININE 0.66 0.65 --  CALCIUM 8.2* 8.2* --  MG -- 1.7 2.0  PHOS -- 2.6 2.0*   Liver Function Tests No results found for this basename: AST:2,ALT:2,ALKPHOS:2,BILITOT:2,PROT:2,ALBUMIN:2 in the last 72 hours No results found for this basename: LIPASE:2,AMYLASE:2 in the last 72 hours Cardiac Enzymes No results found for this basename: CKTOTAL:3,CKMB:3,CKMBINDEX:3,TROPONINI:3 in the last  72 hours BNP No components found with this basename: POCBNP:3 D-Dimer No results found for this basename: DDIMER:2 in the last 72 hours Hemoglobin A1C No results found for this basename: HGBA1C in the last 72 hours Fasting Lipid Panel  Basename 06/15/12 0355  CHOL --  HDL --  LDLCALC --  TRIG 85  CHOLHDL --  LDLDIRECT --   Thyroid Function Tests No results found for this basename: TSH,T4TOTAL,FREET3,T3FREE,THYROIDAB in the last 72 hours  TELE  Atrial fibrillation with a poorly controlled ventricular rate, interventricular conduction delay  ECG    Radiology/Studies  Dg Chest Port 1 View  06/14/2012  *RADIOLOGY REPORT*  Clinical Data: Ventilator, shortness of breath.  PORTABLE CHEST - 1 VIEW  Comparison: 06/13/2012  Findings: Support devices  including endotracheal tube are unchanged.  There is left base atelectasis or infiltrate, stable. Right lung is clear.  Heart is mildly enlarged.  No visible effusions.  No acute bony abnormality.  IMPRESSION: Stable left base atelectasis or infiltrate.  Mild cardiomegaly.   Original Report Authenticated By: Charlett Nose, M.D.    Dg Chest Port 1 View  06/13/2012  *RADIOLOGY REPORT*  Clinical Data: Ventilator.  PORTABLE CHEST - 1 VIEW  Comparison: 06/12/2012  Findings: Endotracheal tube and NG tube remain in place, unchanged. Left base atelectasis or infiltrate, improved since prior study. Right lung is clear.  Heart is borderline in size.  No acute bony abnormality.  IMPRESSION: Decreasing left base atelectasis or infiltrate.   Original Report Authenticated By: Charlett Nose, M.D.    Portable Chest Xray To Verify Ett/ Central Line Placement  06/12/2012  *RADIOLOGY REPORT*  Clinical Data: ETT placement.  PORTABLE CHEST - 1 VIEW  Comparison: Chest x-ray 06/12/2012.  Findings: An endotracheal tube is in place with tip 4.3 cm above the carina. A nasogastric tube is seen extending into the stomach, however, the tip of the nasogastric tube extends below the lower margin of the image.  Compared to the prior examination there is now complete opacification of the base of the left hemithorax and slight shift of structures toward the left hemithorax, suggestive of complete left lower lobe atelectasis.  Lungs are otherwise clear.  No definite pleural effusions.  No evidence of pulmonary edema.  Mild cardiomegaly is unchanged.  Atherosclerosis of the thoracic aorta.  IMPRESSION: 1.  Support apparatus, as above. 2.  Interval development of complete left lower lobe atelectasis. Correlation for signs and symptoms of aspiration or mucous plugging is recommended.  These results were called by telephone on 06/12/2012 at 10:40 a.m. to Dr. Molli Knock, who verbally acknowledged these results.   Original Report Authenticated By: Trudie Reed, M.D.    Dg Chest Port 1 View  06/12/2012  *RADIOLOGY REPORT*  Clinical Data: Nausea, vomiting.  Post intubation.  PORTABLE CHEST - 1 VIEW  Comparison: 10/28/2004  Findings: Endotracheal tube is 4 cm above the carina.  There is cardiomegaly.  No confluent airspace opacities or effusions.  No acute bony abnormality.  IMPRESSION: Endotracheal tube 4 cm above the carina.  Cardiomegaly.   Original Report Authenticated By: Charlett Nose, M.D.    Dg Abd Portable 1v  06/12/2012  *RADIOLOGY REPORT*  Clinical Data: Abdominal pain  PORTABLE ABDOMEN - 1 VIEW  Comparison: None.  Findings: NG tube tip projects over the antrum of the stomach.  No disproportionate dilatation of small bowel.  Right femoral vascular catheter is in place.  No obvious free intraperitoneal gas.  IMPRESSION: Nonobstructive bowel gas pattern.  NG tube.  Original Report Authenticated By: Jolaine Click, M.D.     ASSESSMENT AND PLAN  Active Problems:  Cardiac arrest  Nonischemic cardiomyopathy  Chronic systolic CHF (congestive heart failure)  Acute respiratory failure  Sepsis  Hyperkalemia    She remains in atrial fibrillation with a rapid ventricular rate. We'll change carvedilol to metoprolol tartrate for better rate control. Continue IV amiodarone. Continue IV heparin. She may need TEE cardioversion on Monday. Had TEE yesterday with findings noted. Plan per Dr. Jearld Pies. We'll supplement potassium for level greater than 4.  Signed, Valera Castle MD

## 2012-06-17 NOTE — Progress Notes (Signed)
ANTICOAGULATION CONSULT NOTE - Follow Up Consult  Pharmacy Consult for Heparin Indication: atrial fibrillation  No Known Allergies  Patient Measurements: Height: 5' (152.4 cm) Weight: 113 lb 8.6 oz (51.5 kg) IBW/kg (Calculated) : 45.5  Heparin Dosing Weight:   Vital Signs: Temp: 97.5 F (36.4 C) (11/16 1313) Temp src: Oral (11/16 1313) BP: 98/71 mmHg (11/16 1313) Pulse Rate: 80  (11/16 1313)  Labs:  Basename 06/17/12 1227 06/17/12 0524 06/16/12 2036 06/16/12 0510 06/15/12 0355  HGB -- 9.9* -- 11.3* --  HCT -- 28.9* -- 33.2* 33.7*  PLT -- 160 -- 188 158  APTT -- -- -- -- --  LABPROT -- 18.9* -- 17.7* 18.2*  INR -- 1.64* -- 1.50* 1.56*  HEPARINUNFRC 0.21* 0.35 0.21* -- --  CREATININE -- 0.66 -- 0.65 0.77  CKTOTAL -- -- -- -- --  CKMB -- -- -- -- --  TROPONINI -- -- -- -- --    Estimated Creatinine Clearance: 55.7 ml/min (by C-G formula based on Cr of 0.66).   Medications:  Scheduled:    . ampicillin-sulbactam (UNASYN) IV  1.5 g Intravenous Q6H  . insulin aspart  2-6 Units Subcutaneous Q4H  . lisinopril  5 mg Oral BID  . metoprolol tartrate  25 mg Oral BID  . multivitamin with minerals  1 tablet Oral Daily  . pantoprazole  40 mg Oral Q1200  . [COMPLETED] potassium chloride  40 mEq Oral Once  . [COMPLETED] warfarin  5 mg Oral ONCE-1800  . warfarin  7.5 mg Oral ONCE-1800  . Warfarin - Pharmacist Dosing Inpatient   Does not apply q1800  . [DISCONTINUED] carvedilol  6.25 mg Oral BID WC  . [DISCONTINUED] metoprolol tartrate  25 mg Oral BID    Assessment: 56yo female in AFib-RVR.  Heparin level down 0.21 again, on 950 units/hr.  INR approaching goal with Coumadin 7.5mg  already ordered for this evening.  No bleeding problems noted.  Goal of Therapy:  Heparin level 0.3-0.7 units/ml Monitor platelets by anticoagulation protocol: Yes   Plan:  1.  Increase heparin to1050 units/hr 2.  Check Heparin level 6 hours  Marisue Humble, PharmD Clinical Pharmacist [facility]

## 2012-06-17 NOTE — Progress Notes (Signed)
06/17/2012 0950 Nursing note Clarified with Dr. Daleen Squibb new metoprolol orders given that pt. Had already taken scheduled coreg this am. Verbal orders received to give new metoprolol as ordered. Will continue to closely monitor patient BP and HR.  Kielan Dreisbach, Blanchard Kelch

## 2012-06-17 NOTE — Progress Notes (Signed)
ANTICOAGULATION CONSULT NOTE - Follow Up Consult  Pharmacy Consult for Heparin Indication: atrial fibrillation  No Known Allergies  Patient Measurements: Height: 5' (152.4 cm) Weight: 113 lb 8.6 oz (51.5 kg) IBW/kg (Calculated) : 45.5  Heparin Dosing Weight:   Vital Signs: Temp: 97.9 F (36.6 C) (11/16 1939) Temp src: Oral (11/16 1939) BP: 106/77 mmHg (11/16 1939) Pulse Rate: 87  (11/16 1939)  Labs:  Basename 06/17/12 2030 06/17/12 1227 06/17/12 0524 06/16/12 0510 06/15/12 0355  HGB -- -- 9.9* 11.3* --  HCT -- -- 28.9* 33.2* 33.7*  PLT -- -- 160 188 158  APTT -- -- -- -- --  LABPROT -- -- 18.9* 17.7* 18.2*  INR -- -- 1.64* 1.50* 1.56*  HEPARINUNFRC 0.44 0.21* 0.35 -- --  CREATININE -- -- 0.66 0.65 0.77  CKTOTAL -- -- -- -- --  CKMB -- -- -- -- --  TROPONINI -- -- -- -- --    Estimated Creatinine Clearance: 55.7 ml/min (by C-G formula based on Cr of 0.66).  Assessment: 56yo female with AFib.  Heparin level therapeutic on 1050 units/hr.  No bleeding problems noted.  Goal of Therapy:  Heparin level 0.3-0.7 Monitor platelets by anticoagulation protocol: Yes   Plan:  1.  Continue current heparin rate 2.  F/U AM lab to verify therapeutic.  Marisue Humble, PharmD Clinical Pharmacist [facility]

## 2012-06-18 LAB — PROTIME-INR: INR: 1.75 — ABNORMAL HIGH (ref 0.00–1.49)

## 2012-06-18 LAB — CULTURE, BLOOD (ROUTINE X 2): Culture: NO GROWTH

## 2012-06-18 LAB — CBC
HCT: 30.6 % — ABNORMAL LOW (ref 36.0–46.0)
Hemoglobin: 10.2 g/dL — ABNORMAL LOW (ref 12.0–15.0)
MCH: 32.2 pg (ref 26.0–34.0)
MCHC: 33.3 g/dL (ref 30.0–36.0)
RBC: 3.17 MIL/uL — ABNORMAL LOW (ref 3.87–5.11)

## 2012-06-18 LAB — GLUCOSE, CAPILLARY
Glucose-Capillary: 100 mg/dL — ABNORMAL HIGH (ref 70–99)
Glucose-Capillary: 102 mg/dL — ABNORMAL HIGH (ref 70–99)
Glucose-Capillary: 105 mg/dL — ABNORMAL HIGH (ref 70–99)

## 2012-06-18 LAB — BASIC METABOLIC PANEL
BUN: 11 mg/dL (ref 6–23)
Chloride: 105 mEq/L (ref 96–112)
GFR calc Af Amer: >90 mL/min (ref >90)
GFR calc non Af Amer: >90 mL/min (ref >90)
Potassium: 4.3 mEq/L (ref 3.5–5.1)
Sodium: 138 mEq/L (ref 135–145)

## 2012-06-18 MED ORDER — WARFARIN SODIUM 7.5 MG PO TABS
7.5000 mg | ORAL_TABLET | Freq: Once | ORAL | Status: AC
  Administered 2012-06-18: 7.5 mg via ORAL
  Filled 2012-06-18: qty 1

## 2012-06-18 MED ORDER — FUROSEMIDE 20 MG PO TABS
20.0000 mg | ORAL_TABLET | Freq: Once | ORAL | Status: AC
  Administered 2012-06-18: 20 mg via ORAL
  Filled 2012-06-18: qty 1

## 2012-06-18 MED ORDER — ZOLPIDEM TARTRATE 5 MG PO TABS
5.0000 mg | ORAL_TABLET | Freq: Every evening | ORAL | Status: DC | PRN
Start: 2012-06-18 — End: 2012-06-28
  Administered 2012-06-19 – 2012-06-24 (×6): 5 mg via ORAL
  Filled 2012-06-18 (×6): qty 1

## 2012-06-18 MED ORDER — MAGNESIUM OXIDE 400 (241.3 MG) MG PO TABS
400.0000 mg | ORAL_TABLET | Freq: Every day | ORAL | Status: DC
  Administered 2012-06-18 – 2012-06-28 (×10): 400 mg via ORAL
  Filled 2012-06-18 (×11): qty 1

## 2012-06-18 MED ORDER — HEPARIN (PORCINE) IN NACL 100-0.45 UNIT/ML-% IJ SOLN
1000.0000 [IU]/h | INTRAMUSCULAR | Status: DC
  Administered 2012-06-18 – 2012-06-19 (×2): 1000 [IU]/h via INTRAVENOUS
  Filled 2012-06-18 (×3): qty 250

## 2012-06-18 MED ORDER — ALPRAZOLAM 0.5 MG PO TABS
0.5000 mg | ORAL_TABLET | Freq: Once | ORAL | Status: DC
  Filled 2012-06-18: qty 1

## 2012-06-18 MED ORDER — POTASSIUM CHLORIDE CRYS ER 20 MEQ PO TBCR
40.0000 meq | EXTENDED_RELEASE_TABLET | Freq: Once | ORAL | Status: AC
  Administered 2012-06-18: 40 meq via ORAL
  Filled 2012-06-18: qty 2

## 2012-06-18 MED ORDER — AMIODARONE HCL 200 MG PO TABS
400.0000 mg | ORAL_TABLET | Freq: Three times a day (TID) | ORAL | Status: DC
  Administered 2012-06-18 – 2012-06-19 (×6): 400 mg via ORAL
  Filled 2012-06-18 (×9): qty 2

## 2012-06-18 NOTE — Progress Notes (Signed)
Patient ID: HELIA HAESE, female   DOB: [birth year], 56 y.o.   MRN: [id number]   Patient Name: MALASIA TORAIN Date of Encounter: 06/18/2012    SUBJECTIVE She is less short of breath and less anxious this morning. She remains in atrial fib with an average heart rate of 100. Positive input yesterday. Remains on multiple intravenous drips.  CURRENT MEDS    . ampicillin-sulbactam (UNASYN) IV  1.5 g Intravenous Q6H  . insulin aspart  2-6 Units Subcutaneous Q4H  . lisinopril  5 mg Oral BID  . metoprolol tartrate  25 mg Oral BID  . multivitamin with minerals  1 tablet Oral Daily  . pantoprazole  40 mg Oral Q1200  . [COMPLETED] potassium chloride  40 mEq Oral Once  . [COMPLETED] warfarin  7.5 mg Oral ONCE-1800  . Warfarin - Pharmacist Dosing Inpatient   Does not apply q1800    OBJECTIVE  Filed Vitals:   06/17/12 2154 06/18/12 0415 06/18/12 0513 06/18/12 0934  BP: 117/87 123/77  106/79  Pulse: 123 90  106  Temp:  98.3 F (36.8 C)    TempSrc:  Oral    Resp:  18    Height:      Weight:   116 lb 11.2 oz (52.935 kg)   SpO2:  95%      Intake/Output Summary (Last 24 hours) at 06/18/12 1012 Last data filed at 06/18/12 0800  Gross per 24 hour  Intake    770 ml  Output      0 ml  Net    770 ml   Filed Weights   06/16/12 0500 06/17/12 0412 06/18/12 0513  Weight: 111 lb 14.4 oz (50.758 kg) 113 lb 8.6 oz (51.5 kg) 116 lb 11.2 oz (52.935 kg)    PHYSICAL EXAM  General: Pleasant, NAD. Chronically ill Neuro: Alert and oriented X 3. Moves all extremities spontaneously. Psych: Normal affect. HEENT:  Normal  Neck: Supple without bruits or JVD. Lungs:  Resp regular and unlabored, CTA. Heart: Irregular rate and rhythm no s3, s4, or murmurs. Abdomen: Soft, non-tender, non-distended, BS + x 4.  Extremities: No clubbing, cyanosis or edema. DP/PT/Radials 2+ and equal bilaterally.  Accessory Clinical Findings  CBC  Basename 06/18/12 0735 06/17/12 0524  WBC 8.2 6.2  NEUTROABS -- --    HGB 10.2* 9.9*  HCT 30.6* 28.9*  MCV 96.5 93.8  PLT 170 160   Basic Metabolic Panel  Basename 06/18/12 0735 06/17/12 0524 06/16/12 0510  NA 138 137 --  K 4.3 3.5 --  CL 105 103 --  CO2 22 26 --  GLUCOSE 115* 109* --  BUN 11 10 --  CREATININE 0.78 0.66 --  CALCIUM 8.6 8.2* --  MG -- -- 1.7  PHOS -- -- 2.6   Liver Function Tests No results found for this basename: AST:2,ALT:2,ALKPHOS:2,BILITOT:2,PROT:2,ALBUMIN:2 in the last 72 hours No results found for this basename: LIPASE:2,AMYLASE:2 in the last 72 hours Cardiac Enzymes No results found for this basename: CKTOTAL:3,CKMB:3,CKMBINDEX:3,TROPONINI:3 in the last 72 hours BNP No components found with this basename: POCBNP:3 D-Dimer No results found for this basename: DDIMER:2 in the last 72 hours Hemoglobin A1C No results found for this basename: HGBA1C in the last 72 hours Fasting Lipid Panel  Basename 06/18/12 0735  CHOL --  HDL --  LDLCALC --  TRIG 146  CHOLHDL --  LDLDIRECT --   Thyroid Function Tests No results found for this basename: TSH,T4TOTAL,FREET3,T3FREE,THYROIDAB in the last 72 hours  TELE  Atrial fibrillation  with a better controlled ventricular rate. ECG    Radiology/Studies  Dg Chest Port 1 View  06/14/2012  *RADIOLOGY REPORT*  Clinical Data: Ventilator, shortness of breath.  PORTABLE CHEST - 1 VIEW  Comparison: 06/13/2012  Findings: Support devices including endotracheal tube are unchanged.  There is left base atelectasis or infiltrate, stable. Right lung is clear.  Heart is mildly enlarged.  No visible effusions.  No acute bony abnormality.  IMPRESSION: Stable left base atelectasis or infiltrate.  Mild cardiomegaly.   Original Report Authenticated By: Charlett Nose, M.D.    Dg Chest Port 1 View  06/13/2012  *RADIOLOGY REPORT*  Clinical Data: Ventilator.  PORTABLE CHEST - 1 VIEW  Comparison: 06/12/2012  Findings: Endotracheal tube and NG tube remain in place, unchanged. Left base atelectasis or  infiltrate, improved since prior study. Right lung is clear.  Heart is borderline in size.  No acute bony abnormality.  IMPRESSION: Decreasing left base atelectasis or infiltrate.   Original Report Authenticated By: Charlett Nose, M.D.    Portable Chest Xray To Verify Ett/ Central Line Placement  06/12/2012  *RADIOLOGY REPORT*  Clinical Data: ETT placement.  PORTABLE CHEST - 1 VIEW  Comparison: Chest x-ray 06/12/2012.  Findings: An endotracheal tube is in place with tip 4.3 cm above the carina. A nasogastric tube is seen extending into the stomach, however, the tip of the nasogastric tube extends below the lower margin of the image.  Compared to the prior examination there is now complete opacification of the base of the left hemithorax and slight shift of structures toward the left hemithorax, suggestive of complete left lower lobe atelectasis.  Lungs are otherwise clear.  No definite pleural effusions.  No evidence of pulmonary edema.  Mild cardiomegaly is unchanged.  Atherosclerosis of the thoracic aorta.  IMPRESSION: 1.  Support apparatus, as above. 2.  Interval development of complete left lower lobe atelectasis. Correlation for signs and symptoms of aspiration or mucous plugging is recommended.  These results were called by telephone on 06/12/2012 at 10:40 a.m. to Dr. Molli Knock, who verbally acknowledged these results.   Original Report Authenticated By: Trudie Reed, M.D.    Dg Chest Port 1 View  06/12/2012  *RADIOLOGY REPORT*  Clinical Data: Nausea, vomiting.  Post intubation.  PORTABLE CHEST - 1 VIEW  Comparison: 10/28/2004  Findings: Endotracheal tube is 4 cm above the carina.  There is cardiomegaly.  No confluent airspace opacities or effusions.  No acute bony abnormality.  IMPRESSION: Endotracheal tube 4 cm above the carina.  Cardiomegaly.   Original Report Authenticated By: Charlett Nose, M.D.    Dg Abd Portable 1v  06/12/2012  *RADIOLOGY REPORT*  Clinical Data: Abdominal pain  PORTABLE ABDOMEN  - 1 VIEW  Comparison: None.  Findings: NG tube tip projects over the antrum of the stomach.  No disproportionate dilatation of small bowel.  Right femoral vascular catheter is in place.  No obvious free intraperitoneal gas.  IMPRESSION: Nonobstructive bowel gas pattern.  NG tube.   Original Report Authenticated By: Jolaine Click, M.D.     ASSESSMENT AND PLAN  Active Problems:  Cardiac arrest  Nonischemic cardiomyopathy  Chronic systolic CHF (congestive heart failure)  Acute respiratory failure  Sepsis  Hyperkalemia    Clinically stable. We'll change to by mouth amiodarone today. May need cardioversion tomorrow. Will give 20mg  of Lasix today.Will make NPO past MN. BMET in am. Will start on po Mg.  Signed, Valera Castle MD

## 2012-06-18 NOTE — Progress Notes (Signed)
ANTICOAGULATION CONSULT NOTE - Follow Up Consult  Pharmacy Consult for Heparin, Coumadin Indication: atrial fibrillation  No Known Allergies  Patient Measurements: Height: 5' (152.4 cm) Weight: 116 lb 11.2 oz (52.935 kg) IBW/kg (Calculated) : 45.5  Heparin Dosing Weight:   Vital Signs: Temp: 98.3 F (36.8 C) (11/17 0415) Temp src: Oral (11/17 0415) BP: 106/79 mmHg (11/17 0934) Pulse Rate: 106  (11/17 0934)  Labs:  Basename 06/18/12 0735 06/17/12 2030 06/17/12 1227 06/17/12 0524 06/16/12 0510  HGB 10.2* -- -- 9.9* --  HCT 30.6* -- -- 28.9* 33.2*  PLT 170 -- -- 160 188  APTT -- -- -- -- --  LABPROT 19.8* -- -- 18.9* 17.7*  INR 1.75* -- -- 1.64* 1.50*  HEPARINUNFRC 0.62 0.44 0.21* -- --  CREATININE 0.78 -- -- 0.66 0.65  CKTOTAL -- -- -- -- --  CKMB -- -- -- -- --  TROPONINI -- -- -- -- --    Estimated Creatinine Clearance: 55.7 ml/min (by C-G formula based on Cr of 0.78).   Medications:  Scheduled:    . ampicillin-sulbactam (UNASYN) IV  1.5 g Intravenous Q6H  . insulin aspart  2-6 Units Subcutaneous Q4H  . lisinopril  5 mg Oral BID  . metoprolol tartrate  25 mg Oral BID  . multivitamin with minerals  1 tablet Oral Daily  . pantoprazole  40 mg Oral Q1200  . [COMPLETED] potassium chloride  40 mEq Oral Once  . [COMPLETED] warfarin  7.5 mg Oral ONCE-1800  . Warfarin - Pharmacist Dosing Inpatient   Does not apply q1800    Assessment: 56yo female with AFib, for probable cardioversion.  Heparin level therapeutic this AM, but has continued to inc at a good rate.  INR approaching goal.  No bleeding problems noted.  Goal of Therapy:  Heparin level 0.3-0.7 units/ml, INR 2-3 Monitor platelets by anticoagulation protocol: Yes   Plan:  1.  Decrease heparin to 1000 units/hr 2.  Repeat Coumadin 7.5mg  today 3.  F/U in AM  Marisue Humble, PharmD Clinical Pharmacist [place]

## 2012-06-19 ENCOUNTER — Encounter (HOSPITAL_COMMUNITY): Payer: Self-pay | Admitting: *Deleted

## 2012-06-19 ENCOUNTER — Inpatient Hospital Stay (HOSPITAL_COMMUNITY): Payer: BC Managed Care – PPO | Admitting: *Deleted

## 2012-06-19 ENCOUNTER — Encounter (HOSPITAL_COMMUNITY)
Admission: EM | Disposition: A | Discharge status: Home / Self Care | Payer: Self-pay | Source: Home / Self Care | Attending: Cardiology

## 2012-06-19 DIAGNOSIS — I4891 Unspecified atrial fibrillation: Secondary | ICD-10-CM

## 2012-06-19 HISTORY — PX: TEE WITHOUT CARDIOVERSION: SHX5443

## 2012-06-19 HISTORY — PX: CARDIOVERSION: SHX1299

## 2012-06-19 LAB — BASIC METABOLIC PANEL
BUN: 14 mg/dL (ref 6–23)
CO2: 23 mEq/L (ref 19–32)
Chloride: 105 mEq/L (ref 96–112)
Creatinine, Ser: 0.77 mg/dL (ref 0.50–1.10)
GFR calc Af Amer: >90 mL/min (ref >90)
Glucose, Bld: 98 mg/dL (ref 70–99)
Potassium: 4.8 mEq/L (ref 3.5–5.1)

## 2012-06-19 LAB — CBC
HCT: 29.9 % — ABNORMAL LOW (ref 36.0–46.0)
Hemoglobin: 10.1 g/dL — ABNORMAL LOW (ref 12.0–15.0)
MCH: 33 pg (ref 26.0–34.0)
MCHC: 33.8 g/dL (ref 30.0–36.0)
MCV: 97.7 fL (ref 78.0–100.0)
RDW: 15.5 % (ref 11.5–15.5)

## 2012-06-19 LAB — GLUCOSE, CAPILLARY
Glucose-Capillary: 102 mg/dL — ABNORMAL HIGH (ref 70–99)
Glucose-Capillary: 86 mg/dL (ref 70–99)
Glucose-Capillary: 93 mg/dL (ref 70–99)
Glucose-Capillary: 188 mg/dL — ABNORMAL HIGH (ref 70–99)

## 2012-06-19 LAB — PROTIME-INR: INR: 1.85 — ABNORMAL HIGH (ref 0.00–1.49)

## 2012-06-19 SURGERY — ECHOCARDIOGRAM, TRANSESOPHAGEAL
Anesthesia: General

## 2012-06-19 MED ORDER — FENTANYL CITRATE 0.05 MG/ML IJ SOLN
INTRAMUSCULAR | Status: DC | PRN
  Administered 2012-06-19: 50 ug via INTRAVENOUS

## 2012-06-19 MED ORDER — SODIUM CHLORIDE 0.9 % IV SOLN: 250.0000 mL | INTRAVENOUS | Status: DC

## 2012-06-19 MED ORDER — FUROSEMIDE 10 MG/ML IJ SOLN
40.0000 mg | Freq: Two times a day (BID) | INTRAMUSCULAR | Status: DC
  Administered 2012-06-19 – 2012-06-25 (×11): 40 mg via INTRAVENOUS
  Filled 2012-06-19 (×16): qty 4

## 2012-06-19 MED ORDER — SODIUM CHLORIDE 0.9 % IV SOLN: INTRAVENOUS | Status: DC

## 2012-06-19 MED ORDER — METOPROLOL SUCCINATE ER 25 MG PO TB24
25.0000 mg | ORAL_TABLET | Freq: Two times a day (BID) | ORAL | Status: DC
  Filled 2012-06-19 (×2): qty 1

## 2012-06-19 MED ORDER — SODIUM CHLORIDE 0.9 % IJ SOLN
3.0000 mL | Freq: Two times a day (BID) | INTRAMUSCULAR | Status: DC
  Administered 2012-06-19: 3 mL via INTRAVENOUS

## 2012-06-19 MED ORDER — METOPROLOL SUCCINATE ER 50 MG PO TB24
50.0000 mg | ORAL_TABLET | Freq: Two times a day (BID) | ORAL | Status: DC
  Administered 2012-06-19 (×2): 50 mg via ORAL
  Filled 2012-06-19 (×5): qty 1

## 2012-06-19 MED ORDER — SODIUM CHLORIDE 0.9 % IJ SOLN: 3.0000 mL | INTRAMUSCULAR | Status: DC | PRN

## 2012-06-19 MED ORDER — ALPRAZOLAM 0.5 MG PO TABS
0.5000 mg | ORAL_TABLET | Freq: Once | ORAL | Status: AC
  Administered 2012-06-19: 0.5 mg via ORAL

## 2012-06-19 MED ORDER — SPIRONOLACTONE 12.5 MG HALF TABLET
12.5000 mg | ORAL_TABLET | Freq: Every day | ORAL | Status: DC
  Filled 2012-06-19: qty 1

## 2012-06-19 MED ORDER — FENTANYL CITRATE 0.05 MG/ML IJ SOLN
INTRAMUSCULAR | Status: AC
  Filled 2012-06-19: qty 2

## 2012-06-19 MED ORDER — BUTAMBEN-TETRACAINE-BENZOCAINE 2-2-14 % EX AERO
INHALATION_SPRAY | CUTANEOUS | Status: DC | PRN
  Administered 2012-06-19: 2 via TOPICAL

## 2012-06-19 MED ORDER — WARFARIN SODIUM 5 MG PO TABS
5.0000 mg | ORAL_TABLET | Freq: Once | ORAL | Status: AC
  Administered 2012-06-19: 5 mg via ORAL
  Filled 2012-06-19: qty 1

## 2012-06-19 MED ORDER — PROPOFOL 10 MG/ML IV BOLUS
INTRAVENOUS | Status: DC | PRN
  Administered 2012-06-19: 60 mg via INTRAVENOUS

## 2012-06-19 MED ORDER — METOPROLOL TARTRATE 1 MG/ML IV SOLN
5.0000 mg | Freq: Once | INTRAVENOUS | Status: AC
  Administered 2012-06-19: 5 mg via INTRAVENOUS

## 2012-06-19 MED ORDER — MIDAZOLAM HCL 10 MG/2ML IJ SOLN
INTRAMUSCULAR | Status: DC | PRN
  Administered 2012-06-19 (×2): 2 mg via INTRAVENOUS

## 2012-06-19 MED ORDER — MIDAZOLAM HCL 5 MG/ML IJ SOLN
INTRAMUSCULAR | Status: AC
  Filled 2012-06-19: qty 2

## 2012-06-19 MED ORDER — SODIUM CHLORIDE 0.9 % IV SOLN
INTRAVENOUS | Status: DC | PRN
  Administered 2012-06-19: 16:00:00 via INTRAVENOUS

## 2012-06-19 MED ORDER — METOPROLOL TARTRATE 1 MG/ML IV SOLN
INTRAVENOUS | Status: AC
  Filled 2012-06-19: qty 5

## 2012-06-19 NOTE — Anesthesia Preprocedure Evaluation (Signed)
Anesthesia Evaluation  Patient identified by MRN, date of birth, ID band Patient awake    Reviewed: Allergy & Precautions, H&P , NPO status , Patient's Chart, lab work & pertinent test results, reviewed documented beta blocker date and time   Airway Mallampati: II TM Distance: >3 FB Neck ROM: full    Dental   Pulmonary neg pulmonary ROS,  + rhonchi         Cardiovascular hypertension, +CHF + dysrhythmias Atrial Fibrillation + Valvular Problems/Murmurs MR Rhythm:regular     Neuro/Psych negative neurological ROS  negative psych ROS   GI/Hepatic negative GI ROS, Neg liver ROS,   Endo/Other  negative endocrine ROS  Renal/GU negative Renal ROS  negative genitourinary   Musculoskeletal   Abdominal   Peds  Hematology negative hematology ROS (+)   Anesthesia Other Findings See surgeon's H&P   Reproductive/Obstetrics negative OB ROS                           Anesthesia Physical Anesthesia Plan  ASA: IV  Anesthesia Plan: General   Post-op Pain Management:    Induction: Intravenous  Airway Management Planned: Mask  Additional Equipment:   Intra-op Plan:   Post-operative Plan: Extubation in OR  Informed Consent: I have reviewed the patients History and Physical, chart, labs and discussed the procedure including the risks, benefits and alternatives for the proposed anesthesia with the patient or authorized representative who has indicated his/her understanding and acceptance.   Dental Advisory Given  Plan Discussed with: CRNA and Surgeon  Anesthesia Plan Comments:         Anesthesia Quick Evaluation

## 2012-06-19 NOTE — Transfer of Care (Signed)
Immediate Anesthesia Transfer of Care Note  Patient: [name] [name]  Procedure(s) Performed: Procedure(s) (LRB) with comments: TRANSESOPHAGEAL ECHOCARDIOGRAM (TEE) (N/A) CARDIOVERSION ()  Patient Location: PACU and Endoscopy Unit  Anesthesia Type:General  Level of Consciousness: awake, oriented and patient cooperative  Airway & Oxygen Therapy: Patient Spontanous Breathing and Patient connected to nasal cannula oxygen  Post-op Assessment: Report given to PACU RN and Post -op Vital signs reviewed and stable  Post vital signs: Reviewed and stable  Complications: No apparent anesthesia complications

## 2012-06-19 NOTE — H&P (View-Only) (Signed)
Patient ID: [name] [name], female   DOB: 06/30/1956, 56 y.o.   MRN: [id number]     SUBJECTIVE: HR still high in atrial fibrillation.  Gets short of breath with ambulation.         . [COMPLETED] ALPRAZolam  0.5 mg Oral Once  . amiodarone  400 mg Oral TID  . ampicillin-sulbactam (UNASYN) IV  1.5 g Intravenous Q6H  . [COMPLETED] furosemide  20 mg Oral Once  . insulin aspart  2-6 Units Subcutaneous Q4H  . lisinopril  5 mg Oral BID  . magnesium oxide  400 mg Oral Daily  . metoprolol succinate  25 mg Oral BID  . multivitamin with minerals  1 tablet Oral Daily  . pantoprazole  40 mg Oral Q1200  . [COMPLETED] potassium chloride  40 mEq Oral Once  . spironolactone  12.5 mg Oral Daily  . [COMPLETED] warfarin  7.5 mg Oral ONCE-1800  . Warfarin - Pharmacist Dosing Inpatient   Does not apply q1800  . [DISCONTINUED] ALPRAZolam  0.5 mg Oral Once  . [DISCONTINUED] metoprolol tartrate  25 mg Oral BID      Filed Vitals:   06/18/12 1402 06/18/12 2022 06/18/12 2100 06/19/12 0414  BP: 110/73 109/79 120/93 112/84  Pulse: 99 113 130 114  Temp: 98.2 F (36.8 C) 98.3 F (36.8 C)  97.5 F (36.4 C)  TempSrc:  Oral  Oral  Resp: 18 19  19  Height:      Weight:    116 lb 2.9 oz (52.7 kg)  SpO2: 96% 94%  99%    Intake/Output Summary (Last 24 hours) at 06/19/12 0757 Last data filed at 06/18/12 2159  Gross per 24 hour  Intake    720 ml  Output      1 ml  Net    719 ml    LABS: Basic Metabolic Panel:  Basename 06/19/12 0640 06/18/12 0735  NA 137 138  K 4.8 4.3  CL 105 105  CO2 23 22  GLUCOSE 98 115*  BUN 14 11  CREATININE 0.77 0.78  CALCIUM 8.7 8.6  MG -- --  PHOS -- --   Liver Function Tests: No results found for this basename: AST:2,ALT:2,ALKPHOS:2,BILITOT:2,PROT:2,ALBUMIN:2 in the last 72 hours No results found for this basename: LIPASE:2,AMYLASE:2 in the last 72 hours CBC:  Basename 06/18/12 0735 06/17/12 0524  WBC 8.2 6.2  NEUTROABS -- --  HGB 10.2* 9.9*  HCT 30.6* 28.9*   MCV 96.5 93.8  PLT 170 160   Cardiac Enzymes: No results found for this basename: CKTOTAL:3,CKMB:3,CKMBINDEX:3,TROPONINI:3 in the last 72 hours BNP: No components found with this basename: POCBNP:3 D-Dimer: No results found for this basename: DDIMER:2 in the last 72 hours Hemoglobin A1C: No results found for this basename: HGBA1C in the last 72 hours Fasting Lipid Panel:  Basename 06/18/12 0735  CHOL --  HDL --  LDLCALC --  TRIG 146  CHOLHDL --  LDLDIRECT --   Thyroid Function Tests: No results found for this basename: TSH,T4TOTAL,FREET3,T3FREE,THYROIDAB in the last 72 hours Anemia Panel: No results found for this basename: VITAMINB12,FOLATE,FERRITIN,TIBC,IRON,RETICCTPCT in the last 72 hours  RADIOLOGY: Dg Chest Port 1 View  06/14/2012  *RADIOLOGY REPORT*  Clinical Data: Ventilator, shortness of breath.  PORTABLE CHEST - 1 VIEW  Comparison: 06/13/2012  Findings: Support devices including endotracheal tube are unchanged.  There is left base atelectasis or infiltrate, stable. Right lung is clear.  Heart is mildly enlarged.  No visible effusions.  No acute bony abnormality.  IMPRESSION:   Stable left base atelectasis or infiltrate.  Mild cardiomegaly.   Original Report Authenticated By: Kevin Dover, M.D.    PHYSICAL EXAM General: NAD, frail Neck: JVP 12 cm, no thyromegaly or thyroid nodule.  Lungs: Slight decreased breath sounds at bases bilaterally.  CV: Nondisplaced PMI.  Heart regular S1/S2, no S3/S4, 1/6 HSM LLSB.  No peripheral edema.  No carotid bruit.  Normal pedal pulses.  Abdomen: Soft, nontender, no hepatosplenomegaly, no distention.  Neurologic: Alert and oriented x 3.  Psych: Normal affect. Extremities: No clubbing or cyanosis.   TELEMETRY: Reviewed telemetry pt in atrial fibrillation with RVR in 110s-120s  ASSESSMENT AND PLAN: 1. Cardiomyopathy: Nonischemic, cath with minimal CAD.  ? Tachycardia-mediated.   - Continue lisinopril. - Increase Toprol XL to 50 mg  bid.  - She appears volume overloaded with elevated JVP.  Start Lasix 40 mg IV bid.  2. Atrial fibrillation: With RVR.  Has history of PAF.  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  Patient on heparin gtt and coumadin. - Needs TEE-guided DCCV today.  - Decrease amiodarone dose to bid after cardioversion.  3. Cardiac arrest: Asystole in the setting of hyperkalemia and high digoxin level.  She is off digoxin.  4. ? Aspiration PNA: She in on Unasyn.  Needs Unasyn or Augmentin until 11/18.  5. When HR controlled, needs PT.  Will likely need rehab when ready for discharge.   Tavon Corriher 06/19/2012 7:57 AM   

## 2012-06-19 NOTE — CV Procedure (Signed)
    Transesophageal Echocardiogram Note  [name] [name] [id number] 09/07/1955  Procedure: Transesophageal Echocardiogram Indications: rapid atrial fibrillation, CHf  Procedure Details Consent: Obtained Time Out: Verified patient identification, verified procedure, site/side was marked, verified correct patient position, special equipment/implants available, Radiology Safety Procedures followed,  medications/allergies/relevent history reviewed, required imaging and test results available.  Performed  Medications: Fentanyl: 75 mcg IV Versed: 4 mg IV  Left Ventrical:  Moderate LV dysfunction  Mitral Valve: moderate - severe MR  Aortic Valve: trace AI  Tricuspid Valve: mild - moderate TR  Pulmonic Valve: trace PI  Left Atrium/ Left atrial appendage: no thrombi.  We took multiple views including 3D views and there is not evidence of thrombus.   Atrial septum: pronounced bowing left to right.  No PFO by color doppler  Aorta: normal.    Complications: No apparent complications Patient did tolerate procedure well.     Cardioversion Note  [name] [name] [id number] [birth year]  Procedure: DC Cardioversion Indications: atrial fibrillation  Procedure Details Consent: Obtained Time Out: Verified patient identification, verified procedure, site/side was marked, verified correct patient position, special equipment/implants available, Radiology Safety Procedures followed,  medications/allergies/relevent history reviewed, required imaging and test results available.  Performed  The patient has been on adequate anticoagulation.  The patient received IV Propofol 60 mg for sedation.  Synchronous cardioversion was performed at 120 and then 200 joules.  The cardioversion was unsuccessful.    Complications: No apparent complications Patient did tolerate procedure well.   Vesta Mixer, Montez Hageman., MD, [facility] 06/19/2012, 4:14 PM

## 2012-06-19 NOTE — Preoperative (Signed)
Beta Blockers   Reason not to administer Beta Blockers:Not Applicable, took today at [phone]

## 2012-06-19 NOTE — Anesthesia Postprocedure Evaluation (Signed)
Anesthesia Post Note  Patient: [name] [name]  Procedure(s) Performed: Procedure(s) (LRB): TRANSESOPHAGEAL ECHOCARDIOGRAM (TEE) (N/A) CARDIOVERSION ()  Anesthesia type: general  Patient location: PACU  Post pain: Pain level controlled  Post assessment: Patient's Cardiovascular Status Stable  Last Vitals:  Filed Vitals:   06/19/12 1631  BP: 103/54  Pulse:   Temp: 37 C  Resp: 30    Post vital signs: Reviewed and stable  Level of consciousness: sedated  Complications: No apparent anesthesia complications

## 2012-06-19 NOTE — Progress Notes (Signed)
Patient has been extremely anxious during the early evening shift. Patient gets very upset when talking with family on the phone and her HR goes up to the 150s-160s and her rate of breathing increases with the use of accessory muscles and she employs purse lip breathing. At 2345 patient called out for the bathroom and was very anxious. Her HR was fluctuating in the 150s and 160s. BP was 109/77, O2 was 100% on 2L, 22 RR, Temp 97.9. She told me that she takes Xanax at home and Ambient to relax and help her sleep. MD notified and orders received. Patient given one time dose of 0.5mg  Xanax po. Patient relaxed and went to sleep. Will continue to monitor.

## 2012-06-19 NOTE — Progress Notes (Signed)
Physical Therapy Treatment Patient Details Name: [name] [name] MRN: [id number] DOB: [birth year] Today's Date: 06/19/2012 Time: 5284-1324 PT Time Calculation (min): 40 min  PT Assessment / Plan / Recommendation Comments on Treatment Session  Pt participated well throughout and was assymptomatic although in afib with fluctuation HR fro 80's to 130's during exercise and up through 150's during gait.    Follow Up Recommendations  SNF     Does the patient have the potential to tolerate intense rehabilitation     Barriers to Discharge        Equipment Recommendations  None recommended by PT    Recommendations for Other Services    Frequency Min 3X/week   Plan Discharge plan remains appropriate;Frequency remains appropriate    Precautions / Restrictions Precautions Precautions: Fall   Pertinent Vitals/Pain EHR exercise 80's to 130's, with gait up to 150's    Mobility  Bed Mobility Bed Mobility: Supine to Sit;Sitting - Scoot to [place] of Bed;Sit to Supine;Scooting to [facility] Supine to Sit: 5: Supervision Sitting - Scoot to Edge of Bed: 5: Supervision Sit to Supine: 5: Supervision Scooting to [facility]: 4: Min assist;Other (comment) (to block her feet to get a good push off) Details for Bed Mobility Assistance: no assist needed Transfers Transfers: Sit to Stand;Stand to Sit Sit to Stand: 5: Supervision Stand to Sit: 5: Supervision Details for Transfer Assistance: vc's to reinforce safety, no assist needed Ambulation/Gait Ambulation Distance (Feet): 140 Feet Assistive device: None Ambulation/Gait Assistance Details: generally steady with occaisional mild instability and guarded Gait Pattern: Step-through pattern;Decreased step length - right;Decreased step length - left;Decreased stride length;Trunk flexed Stairs: No    Exercises General Exercises - Upper Extremity Shoulder Flexion: Strengthening;Both;10 reps;Supine (concentric/eccentric) Elbow Flexion: Strengthening;Both;10  reps Elbow Extension: Strengthening;Both;10 reps;Supine General Exercises - Lower Extremity Ankle Circles/Pumps: AROM;Both;20 reps;Supine Heel Slides: Strengthening;Both;10 reps (resisted flexion and extension) Hip ABduction/ADduction: AROM;15 reps;Both;Supine Straight Leg Raises: AROM;Both;10 reps;Supine   PT Diagnosis:    PT Problem List:   PT Treatment Interventions:     PT Goals Acute Rehab PT Goals Time For Goal Achievement: 06/23/12 Potential to Achieve Goals: Good PT Goal: Supine/Side to Sit - Progress: Progressing toward goal PT Goal: Sit to Stand - Progress: Progressing toward goal PT Goal: Stand to Sit - Progress: Progressing toward goal PT Transfer Goal: Bed to Chair/Chair to Bed - Progress: Progressing toward goal PT Goal: Ambulate - Progress: Progressing toward goal PT Goal: Perform Home Exercise Program - Progress: Progressing toward goal  Visit Information  Last PT Received On: 06/19/12 Assistance Needed: +1    Subjective Data  Subjective: I'm going to get my strength back, but I haven't yet   Cognition  Overall Cognitive Status: Appears within functional limits for tasks assessed/performed (but very distractible) Arousal/Alertness: Awake/alert Orientation Level: Appears intact for tasks assessed Behavior During Session: [facility] for tasks performed (mildly anxious)    Balance  Balance Balance Assessed: Yes Static Sitting Balance Static Sitting - Balance Support: Feet supported;No upper extremity supported Static Sitting - Level of Assistance: 5: Stand by assistance Static Standing Balance Static Standing - Balance Support: During functional activity;No upper extremity supported;Right upper extremity supported Static Standing - Level of Assistance: 5: Stand by assistance  End of Session PT - End of Session Activity Tolerance: Treatment limited secondary to medical complications (Comment) Patient left: in bed;with call bell/phone within reach;with  family/visitor present Nurse Communication: Mobility status   GP     [name] [name], [name] [name] 06/19/2012, 2:01 PM  06/19/2012  [name] [name]  [name] [name], PT [phone] (pager)

## 2012-06-19 NOTE — Progress Notes (Signed)
ANTICOAGULATION CONSULT NOTE - Follow Up Consult  Pharmacy Consult for Heparin & Coumadin Indication: atrial fibrillation  Assessment: 56yo female with AFib being managed on heparin and coumadin with TEE-guided DCCV planned for today.  Heparin level therapeutic this AM at 0.54 units/ml.  INR sub-therapeutic but approaching goal.  No bleeding problems noted in chart.  Goal of Therapy:  Heparin level 0.3-0.7 units/ml INR 2-3 Monitor platelets by anticoagulation protocol: Yes   Plan:  1.  Continue IV heparin at current rate of 1000 units/hr 2.  Coumadin 5 mg x 1 today 3.  F/U in HL and INR in AM  [facility], PharmD    06/19/2012   8:52 AM   -------  No Known Allergies  Patient Measurements: Height: 5' (152.4 cm) Weight: 116 lb 2.9 oz (52.7 kg) IBW/kg (Calculated) : 45.5   Vital Signs: Temp: 97.5 F (36.4 C) (11/18 0414) Temp src: Oral (11/18 0414) BP: 112/84 mmHg (11/18 0414) Pulse Rate: 114  (11/18 0414)  Labs:  Basename 06/19/12 0640 06/18/12 0735 06/17/12 2030 06/17/12 0524  HGB 10.1* 10.2* -- --  HCT 29.9* 30.6* -- 28.9*  PLT 171 170 -- 160  APTT -- -- -- --  LABPROT 20.7* 19.8* -- 18.9*  INR 1.85* 1.75* -- 1.64*  HEPARINUNFRC 0.54 0.62 0.44 --  CREATININE 0.77 0.78 -- 0.66  CKTOTAL -- -- -- --  CKMB -- -- -- --  TROPONINI -- -- -- --    Estimated Creatinine Clearance: 55.7 ml/min (by C-G formula based on Cr of 0.77).

## 2012-06-19 NOTE — Interval H&P Note (Signed)
History and Physical Interval Note:  06/19/2012 3:56 PM  [name] [name]  has presented today for surgery, with the diagnosis of afib  The various methods of treatment have been discussed with the patient and family. After consideration of risks, benefits and other options for treatment, the patient has consented to  Procedure(s) (LRB) with comments: TRANSESOPHAGEAL ECHOCARDIOGRAM (TEE) (N/A) CARDIOVERSION () as a surgical intervention .  The patient's history has been reviewed, patient examined, no change in status, stable for surgery.  I have reviewed the patient's chart and labs.  Questions were answered to the patient's satisfaction.     Elyn Aquas.

## 2012-06-19 NOTE — Progress Notes (Addendum)
Patient ID: [name] [name], female   DOB: [birth year], 56 y.o.   MRN: [id number]     SUBJECTIVE: HR still high in atrial fibrillation.  Gets short of breath with ambulation.         . [COMPLETED] ALPRAZolam  0.5 mg Oral Once  . amiodarone  400 mg Oral TID  . ampicillin-sulbactam (UNASYN) IV  1.5 g Intravenous Q6H  . [COMPLETED] furosemide  20 mg Oral Once  . insulin aspart  2-6 Units Subcutaneous Q4H  . lisinopril  5 mg Oral BID  . magnesium oxide  400 mg Oral Daily  . metoprolol succinate  25 mg Oral BID  . multivitamin with minerals  1 tablet Oral Daily  . pantoprazole  40 mg Oral Q1200  . [COMPLETED] potassium chloride  40 mEq Oral Once  . spironolactone  12.5 mg Oral Daily  . [COMPLETED] warfarin  7.5 mg Oral ONCE-1800  . Warfarin - Pharmacist Dosing Inpatient   Does not apply q1800  . [DISCONTINUED] ALPRAZolam  0.5 mg Oral Once  . [DISCONTINUED] metoprolol tartrate  25 mg Oral BID      Filed Vitals:   06/18/12 1402 06/18/12 2022 06/18/12 2100 06/19/12 0414  BP: 110/73 109/79 120/93 112/84  Pulse: 99 113 130 114  Temp: 98.2 F (36.8 C) 98.3 F (36.8 C)  97.5 F (36.4 C)  TempSrc:  Oral  Oral  Resp: 18 19  19   Height:      Weight:    116 lb 2.9 oz (52.7 kg)  SpO2: 96% 94%  99%    Intake/Output Summary (Last 24 hours) at 06/19/12 0757 Last data filed at 06/18/12 2159  Gross per 24 hour  Intake    720 ml  Output      1 ml  Net    719 ml    LABS: Basic Metabolic Panel:  Basename 06/19/12 0640 06/18/12 0735  NA 137 138  K 4.8 4.3  CL 105 105  CO2 23 22  GLUCOSE 98 115*  BUN 14 11  CREATININE 0.77 0.78  CALCIUM 8.7 8.6  MG -- --  PHOS -- --   Liver Function Tests: No results found for this basename: AST:2,ALT:2,ALKPHOS:2,BILITOT:2,PROT:2,ALBUMIN:2 in the last 72 hours No results found for this basename: LIPASE:2,AMYLASE:2 in the last 72 hours CBC:  Basename 06/18/12 0735 06/17/12 0524  WBC 8.2 6.2  NEUTROABS -- --  HGB 10.2* 9.9*  HCT 30.6* 28.9*   MCV 96.5 93.8  PLT 170 160   Cardiac Enzymes: No results found for this basename: CKTOTAL:3,CKMB:3,CKMBINDEX:3,TROPONINI:3 in the last 72 hours BNP: No components found with this basename: POCBNP:3 D-Dimer: No results found for this basename: DDIMER:2 in the last 72 hours Hemoglobin A1C: No results found for this basename: HGBA1C in the last 72 hours Fasting Lipid Panel:  Basename 06/18/12 0735  CHOL --  HDL --  LDLCALC --  TRIG 146  CHOLHDL --  LDLDIRECT --   Thyroid Function Tests: No results found for this basename: TSH,T4TOTAL,FREET3,T3FREE,THYROIDAB in the last 72 hours Anemia Panel: No results found for this basename: VITAMINB12,FOLATE,FERRITIN,TIBC,IRON,RETICCTPCT in the last 72 hours  RADIOLOGY: Dg Chest Port 1 View  06/14/2012  *RADIOLOGY REPORT*  Clinical Data: Ventilator, shortness of breath.  PORTABLE CHEST - 1 VIEW  Comparison: 06/13/2012  Findings: Support devices including endotracheal tube are unchanged.  There is left base atelectasis or infiltrate, stable. Right lung is clear.  Heart is mildly enlarged.  No visible effusions.  No acute bony abnormality.  IMPRESSION:  Stable left base atelectasis or infiltrate.  Mild cardiomegaly.   Original Report Authenticated By: Charlett Nose, M.D.    PHYSICAL EXAM General: NAD, frail Neck: JVP 12 cm, no thyromegaly or thyroid nodule.  Lungs: Slight decreased breath sounds at bases bilaterally.  CV: Nondisplaced PMI.  Heart regular S1/S2, no S3/S4, 1/6 HSM LLSB.  No peripheral edema.  No carotid bruit.  Normal pedal pulses.  Abdomen: Soft, nontender, no hepatosplenomegaly, no distention.  Neurologic: Alert and oriented x 3.  Psych: Normal affect. Extremities: No clubbing or cyanosis.   TELEMETRY: Reviewed telemetry pt in atrial fibrillation with RVR in 110s-120s  ASSESSMENT AND PLAN: 1. Cardiomyopathy: Nonischemic, cath with minimal CAD.  ? Tachycardia-mediated.   - Continue lisinopril. - Increase Toprol XL to 50 mg  bid.  - She appears volume overloaded with elevated JVP.  Start Lasix 40 mg IV bid.  2. Atrial fibrillation: With RVR.  Has history of PAF.  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  Patient on heparin gtt and coumadin. - Needs TEE-guided DCCV today.  - Decrease amiodarone dose to bid after cardioversion.  3. Cardiac arrest: Asystole in the setting of hyperkalemia and high digoxin level.  She is off digoxin.  4. ? Aspiration PNA: She in on Unasyn.  Needs Unasyn or Augmentin until 11/18.  5. When HR controlled, needs PT.  Will likely need rehab when ready for discharge.   [name] 06/19/2012 7:57 AM

## 2012-06-19 NOTE — Progress Notes (Signed)
*  PRELIMINARY RESULTS* Echocardiogram tee has been performed.  [name] [name] 06/19/2012, 5:11 PM

## 2012-06-20 LAB — CBC
HCT: 28 % — ABNORMAL LOW (ref 36.0–46.0)
MCHC: 33.9 g/dL (ref 30.0–36.0)
MCV: 96.9 fL (ref 78.0–100.0)
RDW: 15.5 % (ref 11.5–15.5)
WBC: 7.2 10*3/uL (ref 4.0–10.5)

## 2012-06-20 LAB — BASIC METABOLIC PANEL
CO2: 22 mEq/L (ref 19–32)
Chloride: 104 mEq/L (ref 96–112)
Creatinine, Ser: 0.97 mg/dL (ref 0.50–1.10)
Potassium: 3.7 mEq/L (ref 3.5–5.1)

## 2012-06-20 LAB — GLUCOSE, CAPILLARY
Glucose-Capillary: 98 mg/dL (ref 70–99)
Glucose-Capillary: 153 mg/dL — ABNORMAL HIGH (ref 70–99)

## 2012-06-20 LAB — HEPARIN LEVEL (UNFRACTIONATED): Heparin Unfractionated: 0.3 IU/mL (ref 0.30–0.70)

## 2012-06-20 MED ORDER — WARFARIN SODIUM 2.5 MG PO TABS
2.5000 mg | ORAL_TABLET | Freq: Once | ORAL | Status: AC
  Administered 2012-06-20: 2.5 mg via ORAL
  Filled 2012-06-20: qty 1

## 2012-06-20 MED ORDER — AMIODARONE HCL IN DEXTROSE 360-4.14 MG/200ML-% IV SOLN
30.0000 mg/h | INTRAVENOUS | Status: DC
  Filled 2012-06-20: qty 200

## 2012-06-20 MED ORDER — SODIUM CHLORIDE 0.9 % IV SOLN
250.0000 mL | INTRAVENOUS | Status: DC
  Administered 2012-06-21: 09:00:00 via INTRAVENOUS

## 2012-06-20 MED ORDER — SODIUM CHLORIDE 0.9 % IJ SOLN
3.0000 mL | Freq: Two times a day (BID) | INTRAMUSCULAR | Status: DC
  Administered 2012-06-21: 3 mL via INTRAVENOUS

## 2012-06-20 MED ORDER — POTASSIUM CHLORIDE CRYS ER 20 MEQ PO TBCR
40.0000 meq | EXTENDED_RELEASE_TABLET | Freq: Once | ORAL | Status: AC
  Administered 2012-06-20: 40 meq via ORAL
  Filled 2012-06-20: qty 2

## 2012-06-20 MED ORDER — DIGOXIN 0.25 MG/ML IJ SOLN
0.2500 mg | Freq: Once | INTRAMUSCULAR | Status: AC
  Administered 2012-06-20: 0.25 mg via INTRAVENOUS
  Filled 2012-06-20: qty 1

## 2012-06-20 MED ORDER — AMIODARONE HCL IN DEXTROSE 360-4.14 MG/200ML-% IV SOLN
60.0000 mg/h | INTRAVENOUS | Status: DC
  Administered 2012-06-20: 60 mg/h via INTRAVENOUS
  Filled 2012-06-20: qty 200

## 2012-06-20 MED ORDER — SODIUM CHLORIDE 0.9 % IJ SOLN: 3.0000 mL | INTRAMUSCULAR | Status: DC | PRN

## 2012-06-20 MED ORDER — AMIODARONE LOAD VIA INFUSION
150.0000 mg | Freq: Once | INTRAVENOUS | Status: AC
  Administered 2012-06-20: 150 mg via INTRAVENOUS
  Filled 2012-06-20: qty 83.34

## 2012-06-20 MED ORDER — POTASSIUM CHLORIDE 20 MEQ PO PACK
40.0000 meq | PACK | Freq: Once | ORAL | Status: DC
  Filled 2012-06-20: qty 2

## 2012-06-20 MED ORDER — DIGOXIN 125 MCG PO TABS
0.1250 mg | ORAL_TABLET | Freq: Every day | ORAL | Status: DC
  Administered 2012-06-21 – 2012-06-28 (×7): 0.125 mg via ORAL
  Filled 2012-06-20 (×8): qty 1

## 2012-06-20 MED ORDER — AMIODARONE HCL IN DEXTROSE 360-4.14 MG/200ML-% IV SOLN
30.0000 mg/h | INTRAVENOUS | Status: DC
  Administered 2012-06-21: 30 mg/h via INTRAVENOUS
  Filled 2012-06-20 (×6): qty 200

## 2012-06-20 MED ORDER — METOPROLOL SUCCINATE ER 50 MG PO TB24
75.0000 mg | ORAL_TABLET | Freq: Two times a day (BID) | ORAL | Status: DC
  Administered 2012-06-20 – 2012-06-21 (×4): 75 mg via ORAL
  Filled 2012-06-20 (×7): qty 1

## 2012-06-20 MED ORDER — SODIUM CHLORIDE 0.9 % IV SOLN
INTRAVENOUS | Status: DC
  Administered 2012-06-21: 07:00:00 via INTRAVENOUS

## 2012-06-20 MED ORDER — ENSURE COMPLETE PO LIQD: 237.0000 mL | Freq: Every day | ORAL | Status: DC | PRN

## 2012-06-20 MED ORDER — AMIODARONE HCL IN DEXTROSE 360-4.14 MG/200ML-% IV SOLN
60.0000 mg/h | INTRAVENOUS | Status: AC
  Administered 2012-06-20: 60 mg/h via INTRAVENOUS
  Filled 2012-06-20: qty 200

## 2012-06-20 NOTE — Progress Notes (Signed)
ANTICOAGULATION CONSULT NOTE - Follow Up Consult  Pharmacy Consult for Heparin & Coumadin Indication: atrial fibrillation  Assessment: 56yo female with AFib being managed on heparin and coumadin. Patient failed TEE-guided DCCV 11/18 with plans for re-attempting cardioversion tomorrow. Heparin level therapeutic this AM at 0.3 units/ml.  INR jumped up to therapeutic range today with INR 2.34. Hgb 9.5, Plts stable. No bleeding problems noted in chart. Patient also started on amiodarone recently which can increase INR--will monitor closely.  Goal of Therapy:  Heparin level 0.3-0.7 units/ml INR 2-3 Monitor platelets by anticoagulation protocol: Yes   Plan:  1.  Discontinue IV heparin since INR >2 2.  Coumadin 2.5 mg x 1 today 3.  F/U INR in AM and plans for cardioversion  Benjaman Pott, PharmD    06/20/2012   1:08 PM  -------   No Known Allergies  Patient Measurements: Height: 5' (152.4 cm) Weight: 116 lb 2.9 oz (52.7 kg) IBW/kg (Calculated) : 45.5   Vital Signs: Temp: 97.4 F (36.3 C) (11/19 1030) Temp src: Oral (11/19 1030) BP: 101/73 mmHg (11/19 1030) Pulse Rate: 118  (11/19 1030)  Labs:  Basename 06/20/12 0420 06/19/12 0640 06/18/12 0735  HGB 9.5* 10.1* --  HCT 28.0* 29.9* 30.6*  PLT 185 171 170  APTT -- -- --  LABPROT 24.6* 20.7* 19.8*  INR 2.34* 1.85* 1.75*  HEPARINUNFRC 0.30 0.54 0.62  CREATININE 0.97 0.77 0.78  CKTOTAL -- -- --  CKMB -- -- --  TROPONINI -- -- --    Estimated Creatinine Clearance: 46 ml/min (by C-G formula based on Cr of 0.97).

## 2012-06-20 NOTE — Progress Notes (Signed)
Have collaborated with this pt and note below. 06/20/2012  [place], PT [phone] (pager)

## 2012-06-20 NOTE — Progress Notes (Signed)
Physical Therapy Treatment Patient Details Name: [name] [name] MRN: [id number] DOB: 1955/11/21 Today's Date: 06/20/2012 Time: 4098-1191 PT Time Calculation (min): 24 min  PT Assessment / Plan / Recommendation Comments on Treatment Session  Pt with history of CHF, admitted on 06/12/12 with uncontrolled A-fib.  Pt tolerated treatment well without c/o symptoms; walked 193ft with RW on RA and HR remained between 105-145 throughout session.  She will continue to benefit from skilled physical therapy for strength, endurance, and balance.     Follow Up Recommendations  SNF           Equipment Recommendations  None recommended by PT       Frequency Min 3X/week   Plan Discharge plan remains appropriate;Frequency remains appropriate    Precautions / Restrictions Precautions Precautions: Fall Restrictions Weight Bearing Restrictions: No   Pertinent Vitals/Pain HR 105-145 throughout treatment; there ex and gait training. Denies pain.     Mobility  Bed Mobility Bed Mobility: Supine to Sit;Sitting - Scoot to [place] of Bed;Scooting to [facility];Sit to Supine Supine to Sit: 6: Modified independent (Device/Increase time) Sitting - Scoot to Edge of Bed: 6: Modified independent (Device/Increase time) Sit to Supine: 6: Modified independent (Device/Increase time) Scooting to [facility]: 5: Set up;4: Min assist (vc for positioning) Transfers Transfers: Sit to Stand;Stand to Sit Sit to Stand: 6: Modified independent (Device/Increase time);With upper extremity assist;From bed Stand to Sit: 6: Modified independent (Device/Increase time);With upper extremity assist;To bed Details for Transfer Assistance: vc to move slowly for safety Ambulation/Gait Ambulation/Gait Assistance: 4: Min guard Ambulation Distance (Feet): 175 Feet Assistive device: Rolling walker (without RW for last 4ft of walk) Ambulation/Gait Assistance Details: vc for breathing and 4 standing rest breaks to catch breath, pt a-fib managed HR  103-145 throughout session. Gait Pattern: Step-through pattern;Decreased step length - right;Decreased step length - left;Decreased stride length;Trunk flexed Gait velocity: slowed Stairs: No Wheelchair Mobility Wheelchair Mobility: No    Exercises General Exercises - Lower Extremity Long Arc Quad: AROM;Strengthening;Both;10 reps;Seated Hip Flexion/Marching: Strengthening;AROM;Both;10 reps;Seated     PT Goals Acute Rehab PT Goals PT Goal: Supine/Side to Sit - Progress: Met PT Goal: Sit to Stand - Progress: Met Pt will go Stand to Sit: with min assist PT Goal: Stand to Sit - Progress: Met PT Transfer Goal: Bed to Chair/Chair to Bed - Progress: Met PT Goal: Ambulate - Progress: Progressing toward goal PT Goal: Up/Down Stairs - Progress: Progressing toward goal PT Goal: Perform Home Exercise Program - Progress: Progressing toward goal  Visit Information  Last PT Received On: 06/20/12 Assistance Needed: +1    Subjective Data  Subjective: "I walked once since I've been here and I'm feeling a little neaseous today but the nurse gave me medicine so I'll try to walk some." Patient Stated Goal: to return home   Cognition  Overall Cognitive Status: Appears within functional limits for tasks assessed/performed Arousal/Alertness: Awake/alert Orientation Level: Oriented X4 / Intact Behavior During Session: [facility] for tasks performed    Balance  Static Sitting Balance Static Sitting - Balance Support: Feet supported;No upper extremity supported Static Sitting - Level of Assistance: 6: Modified independent (Device/Increase time) Static Standing Balance Static Standing - Balance Support: During functional activity;Right upper extremity supported Static Standing - Level of Assistance: 5: Stand by assistance  End of Session PT - End of Session Equipment Utilized During Treatment: Gait belt Activity Tolerance: Patient tolerated treatment well;Patient limited by fatigue Patient left: in  bed;with call bell/phone within reach;with family/visitor present Nurse Communication: Mobility status  Sharion Balloon 06/20/2012, 3:44 PM  Sharion Balloon, SPT Acute Rehab Services [phone]

## 2012-06-20 NOTE — Progress Notes (Addendum)
Patient ID: [name] [name], female   DOB: [birth year], 56 y.o.   MRN: [id number]    SUBJECTIVE: Cardioversion failed yesterday.  Patient still in atrial fibrillation with rates up to 140s on occasion.  Patient walked in hall yesterday, denied lightheadedness.  She was mildly dyspneic.          [name] [name] amiodarone  150 mg Intravenous Once  . [COMPLETED] ampicillin-sulbactam (UNASYN) IV  1.5 g Intravenous Q6H  . digoxin  0.25 mg Intravenous Once  . digoxin  0.125 mg Oral Daily  . furosemide  40 mg Intravenous BID  . insulin aspart  2-6 Units Subcutaneous Q4H  . lisinopril  5 mg Oral BID  . magnesium oxide  400 mg Oral Daily  . [COMPLETED] metoprolol  5 mg Intravenous Once  . metoprolol succinate  75 mg Oral BID  . multivitamin with minerals  1 tablet Oral Daily  . pantoprazole  40 mg Oral Q1200  . potassium chloride  40 mEq Oral Once  . sodium chloride  3 mL Intravenous Q12H  . sodium chloride  3 mL Intravenous Q12H  . [COMPLETED] warfarin  5 mg Oral ONCE-1800  . Warfarin - Pharmacist Dosing Inpatient   Does not apply q1800  . [DISCONTINUED] amiodarone  400 mg Oral TID  . [DISCONTINUED] metoprolol succinate  25 mg Oral BID  . [DISCONTINUED] metoprolol succinate  50 mg Oral BID  . [DISCONTINUED] metoprolol tartrate  25 mg Oral BID  . [DISCONTINUED] potassium chloride  40 mEq Oral Once  . [DISCONTINUED] spironolactone  12.5 mg Oral Daily      Filed Vitals:   06/19/12 1936 06/19/12 2140 06/19/12 2144 06/20/12 0418  BP: 99/41 106/52 106/52 88/68  Pulse: 92  119 100  Temp: 97.6 F (36.4 C)   98.4 F (36.9 C)  TempSrc: Oral   Oral  Resp: 19   18  Height:      Weight:      SpO2: 100%   93%    Intake/Output Summary (Last 24 hours) at 06/20/12 0741 Last data filed at 06/19/12 1900  Gross per 24 hour  Intake    440 ml  Output    301 ml  Net    139 ml    LABS: Basic Metabolic Panel:  Basename 06/20/12 0420 06/19/12 0640  NA 139 137  K 3.7 4.8  CL 104 105  CO2 22 23  GLUCOSE  87 98  BUN 17 14  CREATININE 0.97 0.77  CALCIUM 8.7 8.7  MG -- --  PHOS -- --   Liver Function Tests: No results found for this basename: AST:2,ALT:2,ALKPHOS:2,BILITOT:2,PROT:2,ALBUMIN:2 in the last 72 hours No results found for this basename: LIPASE:2,AMYLASE:2 in the last 72 hours CBC:  Basename 06/20/12 0420 06/19/12 0640  WBC 7.2 8.3  NEUTROABS -- --  HGB 9.5* 10.1*  HCT 28.0* 29.9*  MCV 96.9 97.7  PLT 185 171   Cardiac Enzymes: No results found for this basename: CKTOTAL:3,CKMB:3,CKMBINDEX:3,TROPONINI:3 in the last 72 hours BNP: No components found with this basename: POCBNP:3 D-Dimer: No results found for this basename: DDIMER:2 in the last 72 hours Hemoglobin A1C: No results found for this basename: HGBA1C in the last 72 hours Fasting Lipid Panel:  Basename 06/18/12 0735  CHOL --  HDL --  LDLCALC --  TRIG 146  CHOLHDL --  LDLDIRECT --   Thyroid Function Tests: No results found for this basename: TSH,T4TOTAL,FREET3,T3FREE,THYROIDAB in the last 72 hours Anemia Panel: No results found for this basename: VITAMINB12,FOLATE,FERRITIN,TIBC,IRON,RETICCTPCT in  the last 72 hours   PHYSICAL EXAM General: NAD, frail Neck: JVP 12 cm, no thyromegaly or thyroid nodule.  Lungs: Slight decreased breath sounds at bases bilaterally.  CV: Nondisplaced PMI.  Heart regular S1/S2, no S3/S4, 1/6 HSM apex.  No peripheral edema.  No carotid bruit.  Normal pedal pulses.  Abdomen: Soft, nontender, no hepatosplenomegaly, no distention.  Neurologic: Alert and oriented x 3.  Psych: Normal affect. Extremities: No clubbing or cyanosis.   TELEMETRY: Reviewed telemetry pt in atrial fibrillation with RVR in 110s-140s  ASSESSMENT AND PLAN: 1. Cardiomyopathy: Nonischemic, cath with minimal CAD.  Possibly tachycardia-mediated.   - Continue lisinopril. - Increase Toprol XL to 75 mg bid.  - She remains volume overloaded on exam.  Continue IV Lasix, need to record I/Os.  2. Atrial  fibrillation: With RVR.  Has history of PAF.  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  Failed TEE-guided DCCV yesterday.  Still going very fast at times.  TSH was normal earlier in this hospitalization.  - I will put her back on IV amiodarone (had been on po).  Will get LFTs with am labs.  - Increase Toprol XL to 75 mg bid.  - Will load with digoxin today.  Would like to avoid long-term use, however.  - Will re-attempt cardioversion tomorrow after getting more amiodarone today.  - If we cannot cardiovert her and rate stays high, will need to consider AV node ablation + BiV pacing.  3. Cardiac arrest: Asystole in the setting of hyperkalemia.  4. ? Aspiration PNA: Completed course of Unasyn, can stop abx.  5. PT.  Will likely need rehab when ready for discharge.   [name] [name] 06/20/2012 7:41 AM

## 2012-06-20 NOTE — Progress Notes (Signed)
Speech Language Pathology Discharge Patient Details Name: [name] [name] MRN: [id number] DOB: 03/01/1956 Today's Date: 06/20/2012 Time: 7829-5621 SLP Time Calculation (min): 8 min  Patient discharged from SLP services secondary to goals met and no further SLP needs identified.  Please see latest therapy progress note for current level of functioning and progress toward goals.    Progress and discharge plan discussed with patient and/or caregiver: Patient/Caregiver agrees with plan  GO     Royce Macadamia 06/20/2012, 9:30 AM

## 2012-06-20 NOTE — Progress Notes (Signed)
Nutrition Follow-up  Intervention:    Ensure Complete supplement daily PRN (350 kcals, 13 gm protein per 8 fl oz bottle) RD to follow for nutrition care plan  Assessment:   Patient s/p cardiac cath 11/14, transesophageal echocardiogram 11/18.  PO intake 100% per flowsheet records.  Would benefit from nutrition supplements ordered as needed given malnutrition dx (RD follow-up note 11/14).  Diet Order:  Heart Healthy  Meds: Scheduled Meds:   . amiodarone  150 mg Intravenous Once  . digoxin  0.25 mg Intravenous Once  . digoxin  0.125 mg Oral Daily  . furosemide  40 mg Intravenous BID  . insulin aspart  2-6 Units Subcutaneous Q4H  . lisinopril  5 mg Oral BID  . magnesium oxide  400 mg Oral Daily  . [COMPLETED] metoprolol  5 mg Intravenous Once  . metoprolol succinate  75 mg Oral BID  . multivitamin with minerals  1 tablet Oral Daily  . pantoprazole  40 mg Oral Q1200  . potassium chloride  40 mEq Oral Once  . sodium chloride  3 mL Intravenous Q12H  . sodium chloride  3 mL Intravenous Q12H  . [COMPLETED] warfarin  5 mg Oral ONCE-1800  . Warfarin - Pharmacist Dosing Inpatient   Does not apply q1800  . [DISCONTINUED] amiodarone  400 mg Oral TID  . [DISCONTINUED] metoprolol succinate  50 mg Oral BID  . [DISCONTINUED] potassium chloride  40 mEq Oral Once   Continuous Infusions:   . sodium chloride    . sodium chloride    . amiodarone (NEXTERONE PREMIX) 360 mg/200 mL dextrose     And  . amiodarone (NEXTERONE PREMIX) 360 mg/200 mL dextrose    . [DISCONTINUED] sodium chloride    . [DISCONTINUED] sodium chloride    . [DISCONTINUED] heparin 1,000 Units/hr (06/19/12 1239)   PRN Meds:.acetaminophen (TYLENOL) oral liquid 160 mg/5 mL, ondansetron (ZOFRAN) IV, sodium chloride, sodium chloride, zolpidem, [DISCONTINUED] butamben-tetracaine-benzocaine, [DISCONTINUED] fentaNYL, [DISCONTINUED] midazolam   CMP     Component Value Date/Time   NA 139 06/20/2012 0420   K 3.7 06/20/2012 0420   CL 104 06/20/2012 0420   CO2 22 06/20/2012 0420   GLUCOSE 87 06/20/2012 0420   BUN 17 06/20/2012 0420   CREATININE 0.97 06/20/2012 0420   CALCIUM 8.7 06/20/2012 0420   PROT 5.8* 06/14/2012 0430   ALBUMIN 2.7* 06/14/2012 0430   AST 35 06/14/2012 0430   ALT 59* 06/14/2012 0430   ALKPHOS 78 06/14/2012 0430   BILITOT 1.2 06/14/2012 0430   GFRNONAA 64* 06/20/2012 0420   GFRAA 74* 06/20/2012 0420    CBG (last 3)   Basename 06/20/12 1128 06/20/12 0803 06/20/12 0440  GLUCAP 148* 98 83     Intake/Output Summary (Last 24 hours) at 06/20/12 1135 Last data filed at 06/20/12 1100  Gross per 24 hour  Intake    680 ml  Output    201 ml  Net    479 ml    Weight Status:  52.7 kg (11/18) -- fluctuating  Estimated needs:  1500-1700 kcals, 65-75 gm protein  Nutrition Dx:  Inadequate Oral Intake, resolved New Nutrition Dx: Increased Nutrient Needs r/t energy, protein repletion AEB estimated nutrition needs, ongoing  New Goal:  Oral intake with meals & supplements to meet >/= 90% of estimated nutrition needs, progressing  Monitor:  PO & supplemental intake, weight, labs, I/O's  Kirkland Hun, RD, LDN Pager #: [phone] After-Hours Pager #: [phone]

## 2012-06-20 NOTE — Progress Notes (Signed)
Speech Language Pathology Dysphagia Treatment Patient Details Name: [name] [name] MRN: [id number] DOB: 1956/06/30 Today's Date: 06/20/2012 Time: 4098-1191 SLP Time Calculation (min): 8 min  Assessment / Plan / Recommendation Clinical Impression  Pt. seen today for safety with recommended diet and liquids.  Pt. just reclined in bed after eating breakfast due to feeling slightly light headed.  Pt. and family denied any difficulty with oropharyngeal swallow or esophageal deficits.  No fever, no additional CXR since 11/12.  Pt. denied dysphagia prior to this event.  SLP suspects she may have aspirated emesis during vomiting episode upon admission.  Continue regular texture diet and thin liquids.  No further ST needed.    Diet Recommendation  Continue with Current Diet: Regular;Thin liquid    SLP Plan Discharge SLP treatment due to (comment)   Pertinent Vitals/Pain    Swallowing Goals  SLP Swallowing Goals Patient will consume recommended diet without observed clinical signs of aspiration with: Independent assistance;Set-up Swallow Study Goal #1 - Progress: Met  General Temperature Spikes Noted: No Respiratory Status: Room air Behavior/Cognition: Alert;Cooperative;Pleasant mood Oral Cavity - Dentition: Adequate natural dentition Patient Positioning: Upright in bed  Oral Cavity - Oral Hygiene Does patient have any of the following "at risk" factors?: None of the above Brush patient's teeth BID with toothbrush (using toothpaste with fluoride): Yes   Dysphagia Treatment Treatment focused on: Patient/family/caregiver education Treatment Methods/Modalities: Other (comment) (discussion with pt. and family regarding oropharyngeal swall) Patient observed directly with PO's: No Reason PO's not observed: Other (comment) (feeling light headed, just completed breakfast)        Breck Coons Lonell Face.Ed [employer] [phone]  06/20/2012

## 2012-06-21 ENCOUNTER — Ambulatory Visit (HOSPITAL_COMMUNITY): Admit: 2012-06-21 | Payer: Self-pay | Admitting: Internal Medicine

## 2012-06-21 ENCOUNTER — Encounter: Payer: BC Managed Care – PPO | Admitting: Physician Assistant

## 2012-06-21 ENCOUNTER — Encounter (HOSPITAL_COMMUNITY)
Admission: EM | Disposition: A | Discharge status: Home / Self Care | Payer: Self-pay | Source: Home / Self Care | Attending: Cardiology

## 2012-06-21 ENCOUNTER — Encounter (HOSPITAL_COMMUNITY): Payer: Self-pay | Admitting: Cardiovascular Disease

## 2012-06-21 ENCOUNTER — Encounter (HOSPITAL_COMMUNITY): Payer: Self-pay | Admitting: *Deleted

## 2012-06-21 ENCOUNTER — Encounter (HOSPITAL_COMMUNITY): Payer: Self-pay

## 2012-06-21 ENCOUNTER — Encounter: Payer: BC Managed Care – PPO | Admitting: Adult Health

## 2012-06-21 ENCOUNTER — Inpatient Hospital Stay (HOSPITAL_COMMUNITY): Payer: BC Managed Care – PPO | Admitting: *Deleted

## 2012-06-21 DIAGNOSIS — I4891 Unspecified atrial fibrillation: Secondary | ICD-10-CM

## 2012-06-21 DIAGNOSIS — I509 Heart failure, unspecified: Secondary | ICD-10-CM

## 2012-06-21 HISTORY — PX: CARDIOVERSION: SHX1299

## 2012-06-21 HISTORY — PX: AV NODE ABLATION: SHX5458

## 2012-06-21 HISTORY — PX: PERMANENT PACEMAKER INSERTION: SHX5480

## 2012-06-21 LAB — CBC
HCT: 28.6 % — ABNORMAL LOW (ref 36.0–46.0)
MCH: 31.4 pg (ref 26.0–34.0)
MCV: 95.7 fL (ref 78.0–100.0)
Platelets: 197 10*3/uL (ref 150–400)
RBC: 2.99 MIL/uL — ABNORMAL LOW (ref 3.87–5.11)
WBC: 7.5 10*3/uL (ref 4.0–10.5)

## 2012-06-21 LAB — GLUCOSE, CAPILLARY
Glucose-Capillary: 85 mg/dL (ref 70–99)
Glucose-Capillary: 97 mg/dL (ref 70–99)
Glucose-Capillary: 100 mg/dL — ABNORMAL HIGH (ref 70–99)
Glucose-Capillary: 107 mg/dL — ABNORMAL HIGH (ref 70–99)

## 2012-06-21 LAB — COMPREHENSIVE METABOLIC PANEL
ALT: 34 U/L (ref 0–35)
Alkaline Phosphatase: 73 U/L (ref 39–117)
BUN: 18 mg/dL (ref 6–23)
CO2: 25 mEq/L (ref 19–32)
Chloride: 102 mEq/L (ref 96–112)
GFR calc Af Amer: 58 mL/min — ABNORMAL LOW (ref >90)
GFR calc non Af Amer: 50 mL/min — ABNORMAL LOW (ref >90)
Glucose, Bld: 94 mg/dL (ref 70–99)
Potassium: 4.1 mEq/L (ref 3.5–5.1)
Sodium: 138 mEq/L (ref 135–145)
Total Bilirubin: 0.4 mg/dL (ref 0.3–1.2)
Total Protein: 6.2 g/dL (ref 6.0–8.3)

## 2012-06-21 LAB — TRIGLYCERIDES: Triglycerides: 114 mg/dL (ref <150)

## 2012-06-21 SURGERY — CARDIOVERSION
Anesthesia: General | Wound class: Clean

## 2012-06-21 SURGERY — PERMANENT PACEMAKER INSERTION
Anesthesia: LOCAL

## 2012-06-21 SURGERY — IMPLANTABLE CARDIOVERTER DEFIBRILLATOR IMPLANT
Anesthesia: LOCAL

## 2012-06-21 MED ORDER — LIDOCAINE HCL (CARDIAC) 20 MG/ML IV SOLN
INTRAVENOUS | Status: DC | PRN
  Administered 2012-06-21: 50 mg via INTRAVENOUS

## 2012-06-21 MED ORDER — CEFAZOLIN SODIUM-DEXTROSE 2-3 GM-% IV SOLR
2.0000 g | INTRAVENOUS | Status: DC
  Filled 2012-06-21: qty 50

## 2012-06-21 MED ORDER — SODIUM CHLORIDE 0.9 % IV SOLN: 250.0000 mL | INTRAVENOUS | Status: DC

## 2012-06-21 MED ORDER — SODIUM CHLORIDE 0.9 % IJ SOLN: 3.0000 mL | INTRAMUSCULAR | Status: DC | PRN

## 2012-06-21 MED ORDER — PROPOFOL 10 MG/ML IV BOLUS
INTRAVENOUS | Status: DC | PRN
  Administered 2012-06-21: 60 mg via INTRAVENOUS

## 2012-06-21 MED ORDER — SODIUM CHLORIDE 0.9 % IJ SOLN: 3.0000 mL | Freq: Two times a day (BID) | INTRAMUSCULAR | Status: DC

## 2012-06-21 MED ORDER — HEPARIN (PORCINE) IN NACL 2-0.9 UNIT/ML-% IJ SOLN
INTRAMUSCULAR | Status: AC
  Filled 2012-06-21: qty 500

## 2012-06-21 MED ORDER — LIDOCAINE HCL (PF) 1 % IJ SOLN
INTRAMUSCULAR | Status: AC
  Filled 2012-06-21: qty 60

## 2012-06-21 MED ORDER — ACETAMINOPHEN 325 MG PO TABS: 325.0000 mg | ORAL_TABLET | ORAL | Status: DC | PRN

## 2012-06-21 MED ORDER — WARFARIN SODIUM 5 MG PO TABS
5.0000 mg | ORAL_TABLET | Freq: Once | ORAL | Status: DC
  Filled 2012-06-21: qty 1

## 2012-06-21 MED ORDER — MIDAZOLAM HCL 5 MG/5ML IJ SOLN
INTRAMUSCULAR | Status: AC
  Filled 2012-06-21: qty 5

## 2012-06-21 MED ORDER — SODIUM CHLORIDE 0.45 % IV SOLN
INTRAVENOUS | Status: DC
  Administered 2012-06-21: 16:00:00 via INTRAVENOUS

## 2012-06-21 MED ORDER — DEXTROSE 5 % IV SOLN
1.5000 g | Freq: Four times a day (QID) | INTRAVENOUS | Status: AC
  Administered 2012-06-22 (×3): 1.5 g via INTRAVENOUS
  Filled 2012-06-21 (×6): qty 15

## 2012-06-21 MED ORDER — INSULIN ASPART 100 UNIT/ML ~~LOC~~ SOLN
2.0000 [IU] | Freq: Three times a day (TID) | SUBCUTANEOUS | Status: DC
  Administered 2012-06-22: 4 [IU] via SUBCUTANEOUS

## 2012-06-21 MED ORDER — CEFAZOLIN SODIUM 1-5 GM-% IV SOLN
INTRAVENOUS | Status: AC
  Filled 2012-06-21: qty 100

## 2012-06-21 MED ORDER — SODIUM CHLORIDE 0.9 % IR SOLN
80.0000 mg | Status: DC
  Filled 2012-06-21: qty 2

## 2012-06-21 MED ORDER — ONDANSETRON HCL 4 MG/2ML IJ SOLN: 4.0000 mg | Freq: Four times a day (QID) | INTRAMUSCULAR | Status: DC | PRN

## 2012-06-21 MED ORDER — FENTANYL CITRATE 0.05 MG/ML IJ SOLN
INTRAMUSCULAR | Status: AC
  Filled 2012-06-21: qty 2

## 2012-06-21 NOTE — Anesthesia Postprocedure Evaluation (Signed)
  Anesthesia Post-op Note  Patient: [name] [name]  Procedure(s) Performed: Procedure(s) (LRB) with comments: CARDIOVERSION (N/A)  Patient Location: Nursing Unit  Anesthesia Type:General  Level of Consciousness: awake, alert  and oriented  Airway and Oxygen Therapy: Patient Spontanous Breathing and Patient connected to nasal cannula oxygen  Post-op Pain: none  Post-op Assessment: Post-op Vital signs reviewed, Patient's Cardiovascular Status Stable, Respiratory Function Stable, Patent Airway and No signs of Nausea or vomiting  Post-op Vital Signs: Reviewed and stable  Complications: No apparent anesthesia complications

## 2012-06-21 NOTE — Progress Notes (Signed)
S: Patient still Afib with HR 100-120's on Amiodarone drip. Asymptomatic. Echo reviewed- LVEF 30-35%, Mod-severe MR     On coumadin therapy with therapeutic INR. No electrolytes abnormality. O: General: NAD, frail        Neck: JVP 12 cm       Lungs: Slight decreased breath sounds at bases bilaterally.        CV: Nondisplaced PMI. Irregularly irregular S1/S2, no S3/S4, 2/6 HSM apex. No peripheral edema. No carotid bruit        Abdomen: Soft.       Neurologic: Alert and oriented x 3.  A/P - Failed DCCV (150 and 200J ) today. Still tachycardia at times while on betablocker, Digoxin and Amiodarone drip.  - will consult EP today for possible AV node ablation, and may need BiV pacing.  - Discussed with patient and family.  Questions answered

## 2012-06-21 NOTE — Op Note (Signed)
EP Procedure Noted  BiV PPM inserted via the left subclavian vein without immediate complication. D# [id number].

## 2012-06-21 NOTE — Progress Notes (Signed)
Patient ID: [name] [name], female   DOB: [birth year], 56 y.o.   MRN: [id number]     SUBJECTIVE:  HR still high though lower overnight (100s).  Still with spurts up to 140s during the day.  Patient walked with PT yesterday, did better overall.         . [COMPLETED] amiodarone  150 mg Intravenous Once  . [COMPLETED] digoxin  0.25 mg Intravenous Once  . digoxin  0.125 mg Oral Daily  . furosemide  40 mg Intravenous BID  . insulin aspart  2-6 Units Subcutaneous Q4H  . lisinopril  5 mg Oral BID  . magnesium oxide  400 mg Oral Daily  . metoprolol succinate  75 mg Oral BID  . multivitamin with minerals  1 tablet Oral Daily  . pantoprazole  40 mg Oral Q1200  . [COMPLETED] potassium chloride  40 mEq Oral Once  . sodium chloride  3 mL Intravenous Q12H  . [COMPLETED] warfarin  2.5 mg Oral ONCE-1800  . Warfarin - Pharmacist Dosing Inpatient   Does not apply q1800  . [DISCONTINUED] amiodarone  400 mg Oral TID  . [DISCONTINUED] metoprolol succinate  50 mg Oral BID  . [DISCONTINUED] potassium chloride  40 mEq Oral Once  . [DISCONTINUED] sodium chloride  3 mL Intravenous Q12H      Filed Vitals:   06/20/12 1440 06/20/12 1943 06/20/12 2103 06/21/12 0406  BP: 95/74 102/75  110/83  Pulse: 108 55 117 92  Temp: 98.6 F (37 C) 98.3 F (36.8 C)  97.9 F (36.6 C)  TempSrc: Oral Oral  Oral  Resp: 16 18  18   Height:      Weight:      SpO2: 98% 98%  97%    LABS: Basic Metabolic Panel:  Basename 06/21/12 0525 06/20/12 0420  NA 138 139  K 4.1 3.7  CL 102 104  CO2 25 22  GLUCOSE 94 87  BUN 18 17  CREATININE 1.18* 0.97  CALCIUM 8.8 8.7  MG -- --  PHOS -- --   Liver Function Tests:  Basename 06/21/12 0525  AST 22  ALT 34  ALKPHOS 73  BILITOT 0.4  PROT 6.2  ALBUMIN 2.7*   No results found for this basename: LIPASE:2,AMYLASE:2 in the last 72 hours CBC:  Basename 06/21/12 0525 06/20/12 0420  WBC 7.5 7.2  NEUTROABS -- --  HGB 9.4* 9.5*  HCT 28.6* 28.0*  MCV 95.7 96.9  PLT 197  185   Cardiac Enzymes: No results found for this basename: CKTOTAL:3,CKMB:3,CKMBINDEX:3,TROPONINI:3 in the last 72 hours BNP: No components found with this basename: POCBNP:3 D-Dimer: No results found for this basename: DDIMER:2 in the last 72 hours Hemoglobin A1C: No results found for this basename: HGBA1C in the last 72 hours Fasting Lipid Panel:  Basename 06/21/12 0525  CHOL --  HDL --  LDLCALC --  TRIG 114  CHOLHDL --  LDLDIRECT --   Thyroid Function Tests: No results found for this basename: TSH,T4TOTAL,FREET3,T3FREE,THYROIDAB in the last 72 hours Anemia Panel: No results found for this basename: VITAMINB12,FOLATE,FERRITIN,TIBC,IRON,RETICCTPCT in the last 72 hours   PHYSICAL EXAM General: NAD, frail Neck: JVP 12 cm, no thyromegaly or thyroid nodule.  Lungs: Slight decreased breath sounds at bases bilaterally.  CV: Nondisplaced PMI.  Heart regular S1/S2, no S3/S4, 1/6 HSM apex.  No peripheral edema.  No carotid bruit.  Normal pedal pulses.  Abdomen: Soft, nontender, no hepatosplenomegaly, no distention.  Neurologic: Alert and oriented x 3.  Psych: Normal affect. Extremities: No  clubbing or cyanosis.   TELEMETRY: Reviewed telemetry pt in atrial fibrillation with RVR in 100s currently  ASSESSMENT AND PLAN: 1. Cardiomyopathy: Nonischemic, cath with minimal CAD.  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   - Continue lisinopril. - Continue Toprol XL to 75 mg bid.  - She remains volume overloaded on exam.  Continue IV Lasix, record I/Os.  2. Atrial fibrillation: With RVR.  Has history of PAF prior to admission.  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  Failed TEE-guided DCCV Monday.  Still going very fast at times.  TSH was normal earlier in this hospitalization.  - I have reloaded her on IV amiodarone.  LFTs are normal today.   - Increased Toprol XL yesterday to 75 mg bid.  - Digoxin loaded yesterday.  Would like to avoid long-term use if possible as has had  supratherapeutic levels in the past.  - Will re-attempt cardioversion today.  - If we cannot cardiovert her today, EP consult to consider AV node ablation + BiV pacing.  3. Cardiac arrest: Asystole in the setting of hyperkalemia.  4. ? Aspiration PNA: Completed course of Unasyn, off antibiotics.  5. PT.  Will likely need rehab when ready for discharge.   [name] 06/21/2012 7:01 AM

## 2012-06-21 NOTE — Transfer of Care (Signed)
Immediate Anesthesia Transfer of Care Note  Patient: [name] [name]  Procedure(s) Performed: Procedure(s) (LRB) with comments: CARDIOVERSION (N/A)  Patient Location: Nursing Unit  Anesthesia Type:General  Level of Consciousness: awake, alert  and oriented  Airway & Oxygen Therapy: Patient Spontanous Breathing and Patient connected to nasal cannula oxygen  Post-op Assessment: Report given to PACU RN, Post -op Vital signs reviewed and stable and Patient moving all extremities X 4  Post vital signs: Reviewed and stable  Complications: No apparent anesthesia complications

## 2012-06-21 NOTE — Consult Note (Signed)
ELECTROPHYSIOLOGY CONSULT NOTE    Patient ID: PHOUA HOADLEY MRN: [id number], DOB/AGE: 10/15/1955 56 y.o.  Admit date: 06/12/2012 Date of Consult: 06-21-2012  Primary Physician: Fredirick Maudlin, MD Primary Cardiologist: Izard Bing, MD  Reason for Consultation: Atrial fibrillation with RVR and non-ischemic cardiomyopathy  HPI:  Mrs. Haak is a 56 year old female with a past medical history significant for atrial fibrillation (on coumadin), CHF (EF in the last month has ranged from 25-40%), HTN, DM, thyroid disease, COPD and alpha-1 anti-trypsin deficiency who presents to the Emergency Department complaining of abdominal pain, nausea, vomiting, diarrhea on admission day. She developed asystole and respiratory failure requiring intubation, treatment of hyperkalemia ( K 7.7), and initiation of code sepsis with IVF resuscitation, antibiotics, dopamine for hypotension, propofol for sedation. She has recovered from her arrest but has had persistent problems with afib with RVR this admission.  She has failed cardioversions despite being loaded with Digoxin and Amiodarone.    Catheterization this admission demonstrates non-obstructive CAD.   She reports a past medical history of palpitations, and atrial fibrillation was first identified in the medical record in May of 2012 with a ventricular rate of 100.  She also has underlying RBBB with a QRS duration of .  Digoxin has been associated in the past with supratherapeutic levels.    EP has been asked to evaluate for treatment options.    Past Medical History  Diagnosis Date  . Paroxysmal supraventricular tachycardia     Lost to followup-2006  . Atrial fibrillation     Echocardiogram in 2004-borderline LV function; no valvular abnormalities  . Right bundle branch block   . Anxiety   . Anemia     2011: Hemoglobin of 11.4; hematocrit of 33.5; normal MCV     Surgical History:  Past Surgical History  Procedure Date  . Breast  excisional biopsy     Left x2  . Tee without cardioversion 06/19/2012    Procedure: TRANSESOPHAGEAL ECHOCARDIOGRAM (TEE);  Surgeon: Vesta Mixer, MD;  Location: [facility] ENDOSCOPY;  Service: Cardiovascular;  Laterality: N/A;  . Cardioversion 06/19/2012    Procedure: CARDIOVERSION;  Surgeon: Vesta Mixer, MD;  Location: [facility] ENDOSCOPY;  Service: Cardiovascular;;     Prescriptions prior to admission  Medication Sig Dispense Refill  . ALPRAZolam (XANAX) 0.25 MG tablet Take 0.5 mg by mouth at bedtime as needed. For anxiety      . atenolol (TENORMIN) 50 MG tablet Take 25 mg by mouth 3 (three) times daily.      . cephALEXin (KEFLEX) 500 MG capsule Take 500 mg by mouth 4 (four) times daily.      . digoxin (LANOXIN) 0.125 MG tablet Take 0.25 mg by mouth daily.      Marland Kitchen diltiazem (CARDIZEM CD) 180 MG 24 hr capsule Take 360 mg by mouth daily.      . furosemide (LASIX) 40 MG tablet Take 40 mg by mouth 2 (two) times daily.      . insulin glargine (LANTUS) 100 UNIT/ML injection Inject 5 Units into the skin at bedtime.      . pantoprazole (PROTONIX) 40 MG tablet Take 40 mg by mouth daily.      . potassium chloride SA (K-DUR,KLOR-CON) 20 MEQ tablet Take 40 mEq by mouth 3 (three) times daily.      . sertraline (ZOLOFT) 50 MG tablet Take 50 mg by mouth daily.      Marland Kitchen SPIRIVA HANDIHALER 18 MCG inhalation capsule Place 18 mcg into inhaler and  inhale daily.       Marland Kitchen warfarin (COUMADIN) 5 MG tablet Take 5 mg by mouth daily at 6 PM.       . zolpidem (AMBIEN) 5 MG tablet Take 5 mg by mouth at bedtime as needed. For sleep        Inpatient Medications:    . digoxin  0.125 mg Oral Daily  . furosemide  40 mg Intravenous BID  . insulin aspart  2-6 Units Subcutaneous Q4H  . lisinopril  5 mg Oral BID  . magnesium oxide  400 mg Oral Daily  . metoprolol succinate  75 mg Oral BID  . multivitamin with minerals  1 tablet Oral Daily  . pantoprazole  40 mg Oral Q1200  . sodium chloride  3 mL Intravenous Q12H  . warfarin  5  mg Oral ONCE-1800    Allergies: No Known Allergies  History   Social History  . Marital Status: Divorced    Spouse Name: N/A    Number of Children: 2  . Years of Education: N/A   Occupational History  . 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 Smoking status: Never Smoker   . Smokeless tobacco: Never Used  . Alcohol Use: No  . Drug Use: No  . Sexually Active: Not on file    Family History  Problem Relation Age of Onset  . Cancer Father     carcinoma of the lung  . Cancer Sister     carcinoma of the lung      Well appearing NAD HEENT: Unremarkable Neck:  No JVD, no thyromegally Lymphatics:  No adenopathy Back:  No CVA tenderness Lungs:  Clear except for an occaisional scattered rale. HEART:  IRegular rate rhythm, no murmurs, no rubs, no clicks Abd:  soft, positive bowel sounds, no organomegally, no rebound, no guarding Ext:  2 plus pulses, no edema, no cyanosis, no clubbing Skin:  No rashes no nodules Neuro:  CN II through XII intact, motor grossly intact    Labs:   Lab Results  Component Value Date   WBC 7.5 06/21/2012   HGB 9.4* 06/21/2012   HCT 28.6* 06/21/2012   MCV 95.7 06/21/2012   PLT 197 06/21/2012    Lab 06/21/12 0525  NA 138  K 4.1  CL 102  CO2 25  BUN 18  CREATININE 1.18*  CALCIUM 8.8  PROT 6.2  BILITOT 0.4  ALKPHOS 73  ALT 34  AST 22  GLUCOSE 94   Lab Results  Component Value Date   TROPONINI <0.30 06/12/2012   Lab Results  Component Value Date   CHOL 105 06/13/2012   Lab Results  Component Value Date   HDL 41 06/13/2012   Lab Results  Component Value Date   LDLCALC 46 06/13/2012   Lab Results  Component Value Date   TRIG 114 06/21/2012   TRIG 146 06/18/2012   TRIG 85 06/15/2012   Lab Results  Component Value Date   CHOLHDL 2.6 06/13/2012   No results found for this basename: LDLDIRECT    Lab Results  Component Value Date   DDIMER 1.16* 06/12/2012     Radiology/Studies: Dg  Chest Port 1 View 06/14/2012  *RADIOLOGY REPORT*  Clinical Data: Ventilator, shortness of breath.  PORTABLE CHEST - 1 VIEW  Comparison: 06/13/2012  Findings: Support devices including endotracheal tube are unchanged.  There is left base atelectasis or infiltrate, stable. Right lung is clear.  Heart is mildly  enlarged.  No visible effusions.  No acute bony abnormality.  IMPRESSION: Stable left base atelectasis or infiltrate.  Mild cardiomegaly.   Original Report Authenticated By: Charlett Nose, M.D.    ZOX:WRUE with RVR, RBBB  TELEMETRY: afib with RVR, rates 100-150's  ECHO: 06-12-2012- EF 25-30%, diffuse hypokinesis, mild to moderate aortic regurgitation, moderate mitral regurgitation, LA 52mm  A/P  1. Atrial fibrillation with an RVR, uncontrolled despite maximal medical therapy 2. Non-ischemic, CM, possibly tachy mediated 3. COPD Rec: I have discussed the treatment options with the patient and have recommended BiV PPM insertion and AV node ablation. The risks/benefits/goals/expectations of the procedure have been discussed with the patient and they wish to proceed.  Leonia Reeves.D.

## 2012-06-21 NOTE — H&P (Signed)
[name] [name] is a 56 year old female with a past medical history significant for atrial fibrillation (on coumadin), CHF (EF in the last month has ranged from 25-40%), HTN, DM, thyroid disease, COPD and alpha-1 anti-trypsin deficiency who presents to the Emergency Department complaining of abdominal pain, nausea, vomiting, diarrhea on admission day. She developed asystole and respiratory failure requiring intubation, treatment of hyperkalemia ( K 7.7), and initiation of code sepsis with IVF resuscitation, antibiotics, dopamine for hypotension, propofol for sedation. She has recovered from her arrest but has had persistent problems with afib with RVR this admission. She has failed cardioversions despite being loaded with Digoxin and Amiodarone.  Catheterization this admission demonstrates non-obstructive CAD.  She reports a past medical history of palpitations, and atrial fibrillation was first identified in the medical record in May of 2012 with a ventricular rate of 100. She also has underlying RBBB with a QRS duration of .  Digoxin has been associated in the past with supratherapeutic levels.  EP has been asked to evaluate for treatment options.  Past Medical History   Diagnosis  Date   .  Paroxysmal supraventricular tachycardia      Lost to followup-2006   .  Atrial fibrillation      Echocardiogram in 2004-borderline LV function; no valvular abnormalities   .  Right bundle branch block    .  Anxiety    .  Anemia      2011: Hemoglobin of 11.4; hematocrit of 33.5; normal MCV    Surgical History:  Past Surgical History   Procedure  Date   .  Breast excisional biopsy      Left x2   .  Tee without cardioversion  06/19/2012     Procedure: TRANSESOPHAGEAL ECHOCARDIOGRAM (TEE); Surgeon: Vesta Mixer, MD; Location: [facility] ENDOSCOPY; Service: Cardiovascular; Laterality: N/A;   .  Cardioversion  06/19/2012     Procedure: CARDIOVERSION; Surgeon: Vesta Mixer, MD; Location: [facility] ENDOSCOPY;  Service: Cardiovascular;;    Prescriptions prior to admission   Medication  Sig  Dispense  Refill   .  ALPRAZolam (XANAX) 0.25 MG tablet  Take 0.5 mg by mouth at bedtime as needed. For anxiety     .  atenolol (TENORMIN) 50 MG tablet  Take 25 mg by mouth 3 (three) times daily.     .  cephALEXin (KEFLEX) 500 MG capsule  Take 500 mg by mouth 4 (four) times daily.     .  digoxin (LANOXIN) 0.125 MG tablet  Take 0.25 mg by mouth daily.     Marland Kitchen  diltiazem (CARDIZEM CD) 180 MG 24 hr capsule  Take 360 mg by mouth daily.     .  furosemide (LASIX) 40 MG tablet  Take 40 mg by mouth 2 (two) times daily.     .  insulin glargine (LANTUS) 100 UNIT/ML injection  Inject 5 Units into the skin at bedtime.     .  pantoprazole (PROTONIX) 40 MG tablet  Take 40 mg by mouth daily.     .  potassium chloride SA (K-DUR,KLOR-CON) 20 MEQ tablet  Take 40 mEq by mouth 3 (three) times daily.     .  sertraline (ZOLOFT) 50 MG tablet  Take 50 mg by mouth daily.     Marland Kitchen  SPIRIVA HANDIHALER 18 MCG inhalation capsule  Place 18 mcg into inhaler and inhale daily.     Marland Kitchen  warfarin (COUMADIN) 5 MG tablet  Take 5 mg by mouth daily at 6 PM.     .  zolpidem (AMBIEN) 5 MG tablet  Take 5 mg by mouth at bedtime as needed. For sleep     Inpatient Medications:  .  digoxin  0.125 mg  Oral  Daily   .  furosemide  40 mg  Intravenous  BID   .  insulin aspart  2-6 Units  Subcutaneous  Q4H   .  lisinopril  5 mg  Oral  BID   .  magnesium oxide  400 mg  Oral  Daily   .  metoprolol succinate  75 mg  Oral  BID   .  multivitamin with minerals  1 tablet  Oral  Daily   .  pantoprazole  40 mg  Oral  Q1200   .  sodium chloride  3 mL  Intravenous  Q12H   .  warfarin  5 mg  Oral  ONCE-1800   Allergies: No Known Allergies  History    Social History   .  Marital Status:  Divorced     Spouse Name:  N/A     Number of Children:  2   .  Years of Education:  N/A    Occupational History   .  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  Smoking status:  Never Smoker   .  Smokeless tobacco:  Never Used   .  Alcohol Use:  No   .  Drug Use:  No   .  Sexually Active:  Not on file    Family History   Problem  Relation  Age of Onset   .  Cancer  Father       carcinoma of the lung    .  Cancer  Sister       carcinoma of the lung    Well appearing NAD  HEENT: Unremarkable  Neck: No JVD, no thyromegally  Lymphatics: No adenopathy  Back: No CVA tenderness  Lungs: Clear except for an occaisional scattered rale.  HEART: IRegular rate rhythm, no murmurs, no rubs, no clicks  Abd: soft, positive bowel sounds, no organomegally, no rebound, no guarding  Ext: 2 plus pulses, no edema, no cyanosis, no clubbing  Skin: No rashes no nodules  Neuro: CN II through XII intact, motor grossly intact  Labs:  Lab Results   Component  Value  Date    WBC  7.5  06/21/2012    HGB  9.4*  06/21/2012    HCT  28.6*  06/21/2012    MCV  95.7  06/21/2012    PLT  197  06/21/2012    Lab  06/21/12 0525   NA  138   K  4.1   CL  102   CO2  25   BUN  18   CREATININE  1.18*   CALCIUM  8.8   PROT  6.2   BILITOT  0.4   ALKPHOS  73   ALT  34   AST  22   GLUCOSE  94    Lab Results   Component  Value  Date    TROPONINI  <0.30  06/12/2012    Lab Results   Component  Value  Date    CHOL  105  06/13/2012    Lab Results   Component  Value  Date    HDL  41  06/13/2012    Lab Results   Component  Value  Date    LDLCALC  46  06/13/2012    Lab Results   Component  Value  Date    TRIG  114  06/21/2012    TRIG  146  06/18/2012    TRIG  85  06/15/2012    Lab Results   Component  Value  Date    CHOLHDL  2.6  06/13/2012    No results found for this basename: LDLDIRECT    Lab Results   Component  Value  Date    DDIMER  1.16*  06/12/2012    Radiology/Studies: Dg Chest Port 1 View 06/14/2012 *RADIOLOGY REPORT* Clinical Data: Ventilator, shortness of breath. PORTABLE CHEST - 1 VIEW Comparison: 06/13/2012 Findings: Support  devices including endotracheal tube are unchanged. There is left base atelectasis or infiltrate, stable. Right lung is clear. Heart is mildly enlarged. No visible effusions. No acute bony abnormality. IMPRESSION: Stable left base atelectasis or infiltrate. Mild cardiomegaly. Original Report Authenticated By: Charlett Nose, M.D.  AVW:UJWJ with RVR, RBBB  TELEMETRY: afib with RVR, rates 100-150's  ECHO: 06-12-2012- EF 25-30%, diffuse hypokinesis, mild to moderate aortic regurgitation, moderate mitral regurgitation, LA 52mm  A/P  1. Atrial fibrillation with an RVR, uncontrolled despite maximal medical therapy  2. Non-ischemic, CM, possibly tachy mediated  3. COPD  Rec: I have discussed the treatment options with the patient and have recommended BiV PPM insertion and AV node ablation. The risks/benefits/goals/expectations of the procedure have been discussed with the patient and they wish to proceed.  Leonia Reeves.D.

## 2012-06-21 NOTE — Progress Notes (Signed)
06/21/2012 1:41 PM Nursing note Patient and family viewed educational video #118 on pacemakers. Questions and concerns addressed.  Kyron Schlitt, Blanchard Kelch

## 2012-06-21 NOTE — CV Procedure (Signed)
Electrical Cardioversion Procedure Note [name] [name] [id number] 1956/03/18  Procedure: Electrical Cardioversion Indications:  Atrial Fibrillation  Procedure Details Consent: Risks of procedure as well as the alternatives and risks of each were explained to the (patient/caregiver).  Consent for procedure obtained. Time Out: Verified patient identification, verified procedure, site/side was marked, verified correct patient position, special equipment/implants available, medications/allergies/relevent history reviewed, required imaging and test results available.  Performed  Patient placed on cardiac monitor, pulse oximetry, supplemental oxygen as necessary.  Sedation given: propofol Pacer pads placed anterior and posterior chest.  Cardioverted 2 time(s).  Cardioverted at 150 and 200  Evaluation Findings: Post procedure EKG shows: Atrial Fibrillation Complications: None Patient did tolerate procedure well.   Cassell Clement 06/21/2012, 9:23 AM

## 2012-06-21 NOTE — Preoperative (Signed)
Beta Blockers   Reason not to administer Beta Blockers:Not Applicable 

## 2012-06-21 NOTE — Progress Notes (Signed)
ANTICOAGULATION CONSULT NOTE - Follow Up Consult  Pharmacy Consult for Coumadin Indication: atrial fibrillation  Assessment:56yo female with AFib on coumadin. Patient failed TEE-guided DCCV 11/18 and again today 11/20.   INR  therapeutic today at 2.52.  CBC stable.  No problems noted in chart. Patient also started on amiodarone drip which can increase INR--will monitor closely. Home dose was 5 mg daily but pt was not on amiodarone PTA.    Goal of Therapy:  INR 2-3  Plan:   1.Coumadin 5 mg po x 1 dose 2. Daily INR  Herby Abraham, Pharm.D. [phone] 06/21/2012 9:40 AM    No Known Allergies  Patient Measurements: Height: 5' (152.4 cm) Weight: 116 lb 2.9 oz (52.7 kg) IBW/kg (Calculated) : 45.5   Vital Signs: Temp: 97.9 F (36.6 C) (11/20 0406) Temp src: Oral (11/20 0406) BP: 110/83 mmHg (11/20 0406) Pulse Rate: 92  (11/20 0406)  Labs:  Basename 06/21/12 0525 06/20/12 0420 06/19/12 0640  HGB 9.4* 9.5* --  HCT 28.6* 28.0* 29.9*  PLT 197 185 171  APTT -- -- --  LABPROT 26.0* 24.6* 20.7*  INR 2.52* 2.34* 1.85*  HEPARINUNFRC -- 0.30 0.54  CREATININE 1.18* 0.97 0.77  CKTOTAL -- -- --  CKMB -- -- --  TROPONINI -- -- --    Estimated Creatinine Clearance: 37.8 ml/min (by C-G formula based on Cr of 1.18).

## 2012-06-21 NOTE — Anesthesia Preprocedure Evaluation (Addendum)
Anesthesia Evaluation  Patient identified by MRN, date of birth, ID band Patient awake    Reviewed: Allergy & Precautions, H&P , NPO status , Patient's Chart, lab work & pertinent test results  Airway       Dental   Pulmonary          Cardiovascular hypertension, Pt. on medications +CHF + dysrhythmias Atrial Fibrillation     Neuro/Psych Anxiety    GI/Hepatic   Endo/Other    Renal/GU      Musculoskeletal   Abdominal   Peds  Hematology   Anesthesia Other Findings   Reproductive/Obstetrics                          Anesthesia Physical Anesthesia Plan  ASA: III  Anesthesia Plan: General   Post-op Pain Management:    Induction: Intravenous  Airway Management Planned: Mask  Additional Equipment:   Intra-op Plan:   Post-operative Plan:   Informed Consent: I have reviewed the patients History and Physical, chart, labs and discussed the procedure including the risks, benefits and alternatives for the proposed anesthesia with the patient or authorized representative who has indicated his/her understanding and acceptance.     Plan Discussed with: CRNA and Anesthesiologist  Anesthesia Plan Comments:         Anesthesia Quick Evaluation

## 2012-06-22 ENCOUNTER — Ambulatory Visit (HOSPITAL_COMMUNITY): Admit: 2012-06-22 | Payer: Self-pay | Admitting: Internal Medicine

## 2012-06-22 ENCOUNTER — Encounter (HOSPITAL_COMMUNITY): Payer: Self-pay | Admitting: Cardiology

## 2012-06-22 ENCOUNTER — Encounter (HOSPITAL_COMMUNITY)
Admission: EM | Disposition: A | Discharge status: Home / Self Care | Payer: Self-pay | Source: Home / Self Care | Attending: Cardiology

## 2012-06-22 ENCOUNTER — Encounter: Payer: Self-pay | Admitting: *Deleted

## 2012-06-22 ENCOUNTER — Encounter (HOSPITAL_COMMUNITY): Payer: Self-pay

## 2012-06-22 ENCOUNTER — Inpatient Hospital Stay (HOSPITAL_COMMUNITY): Payer: BC Managed Care – PPO

## 2012-06-22 DIAGNOSIS — Z950 Presence of cardiac pacemaker: Secondary | ICD-10-CM | POA: Insufficient documentation

## 2012-06-22 DIAGNOSIS — I4891 Unspecified atrial fibrillation: Secondary | ICD-10-CM

## 2012-06-22 HISTORY — PX: AV NODE ABLATION: SHX5458

## 2012-06-22 LAB — PROTIME-INR
INR: 2.1 — ABNORMAL HIGH (ref 0.00–1.49)
Prothrombin Time: 22.7 seconds — ABNORMAL HIGH (ref 11.6–15.2)

## 2012-06-22 LAB — GLUCOSE, CAPILLARY
Glucose-Capillary: 103 mg/dL — ABNORMAL HIGH (ref 70–99)
Glucose-Capillary: 88 mg/dL (ref 70–99)
Glucose-Capillary: 90 mg/dL (ref 70–99)
Glucose-Capillary: 112 mg/dL — ABNORMAL HIGH (ref 70–99)

## 2012-06-22 LAB — BASIC METABOLIC PANEL
Chloride: 99 mEq/L (ref 96–112)
GFR calc Af Amer: 73 mL/min — ABNORMAL LOW (ref >90)
GFR calc non Af Amer: 63 mL/min — ABNORMAL LOW (ref >90)
Potassium: 4.5 mEq/L (ref 3.5–5.1)
Sodium: 134 mEq/L — ABNORMAL LOW (ref 135–145)

## 2012-06-22 LAB — CBC
Hemoglobin: 9.5 g/dL — ABNORMAL LOW (ref 12.0–15.0)
MCH: 32.9 pg (ref 26.0–34.0)
Platelets: 207 10*3/uL (ref 150–400)
RBC: 2.89 MIL/uL — ABNORMAL LOW (ref 3.87–5.11)
WBC: 7.3 10*3/uL (ref 4.0–10.5)

## 2012-06-22 SURGERY — AV NODE ABLATION
Anesthesia: LOCAL

## 2012-06-22 MED ORDER — SODIUM CHLORIDE 0.9 % IV SOLN
INTRAVENOUS | Status: DC
  Administered 2012-06-22: 10 mL/h via INTRAVENOUS

## 2012-06-22 MED ORDER — SODIUM CHLORIDE 0.9 % IV SOLN: 250.0000 mL | INTRAVENOUS | Status: DC | PRN

## 2012-06-22 MED ORDER — BUPIVACAINE HCL (PF) 0.25 % IJ SOLN
INTRAMUSCULAR | Status: AC
  Filled 2012-06-22: qty 60

## 2012-06-22 MED ORDER — SODIUM CHLORIDE 0.9 % IJ SOLN: 3.0000 mL | INTRAMUSCULAR | Status: DC | PRN

## 2012-06-22 MED ORDER — METOPROLOL SUCCINATE ER 100 MG PO TB24
100.0000 mg | ORAL_TABLET | Freq: Two times a day (BID) | ORAL | Status: DC
  Administered 2012-06-22: 100 mg via ORAL
  Filled 2012-06-22 (×4): qty 1

## 2012-06-22 MED ORDER — FUROSEMIDE 10 MG/ML IJ SOLN
INTRAMUSCULAR | Status: AC
  Filled 2012-06-22: qty 4

## 2012-06-22 MED ORDER — ACETAMINOPHEN 325 MG PO TABS: 650.0000 mg | ORAL_TABLET | ORAL | Status: DC | PRN

## 2012-06-22 MED ORDER — SODIUM CHLORIDE 0.9 % IJ SOLN
3.0000 mL | Freq: Two times a day (BID) | INTRAMUSCULAR | Status: DC
  Administered 2012-06-22 (×2): 3 mL via INTRAVENOUS

## 2012-06-22 MED ORDER — WARFARIN SODIUM 5 MG PO TABS
5.0000 mg | ORAL_TABLET | Freq: Once | ORAL | Status: AC
  Administered 2012-06-22: 5 mg via ORAL
  Filled 2012-06-22: qty 1

## 2012-06-22 MED ORDER — ONDANSETRON HCL 4 MG/2ML IJ SOLN: 4.0000 mg | Freq: Four times a day (QID) | INTRAMUSCULAR | Status: DC | PRN

## 2012-06-22 MED ORDER — MIDAZOLAM HCL 5 MG/5ML IJ SOLN
INTRAMUSCULAR | Status: AC
  Filled 2012-06-22: qty 5

## 2012-06-22 MED ORDER — FENTANYL CITRATE 0.05 MG/ML IJ SOLN
INTRAMUSCULAR | Status: AC
  Filled 2012-06-22: qty 2

## 2012-06-22 NOTE — Progress Notes (Addendum)
   ELECTROPHYSIOLOGY ROUNDING NOTE    Patient Name: [name] [name] Date of Encounter: 06-22-2012    SUBJECTIVE:Patient s/p CRT-P implantation 06-21-2012 for afib with RVR despite maximal medical therapy and non-ischemic cardiomyopathy.  LV lead with no capture except for diaphragmatic stim.    Feels ok.  No chest pain, shortness of breath stable.  Minimal incisional soreness.   TELEMETRY: Reviewed telemetry pt in atrial fib with RVR, rates 100's Filed Vitals:   06/21/12 2147 06/21/12 2247 06/21/12 2344 06/22/12 0400  BP: 101/76 102/78 102/79 107/77  Pulse: 76 116 112 76  Temp:    97.3 F (36.3 C)  TempSrc:    Oral  Resp:    18  Height:      Weight:    115 lb 11.9 oz (52.5 kg)  SpO2:    97%  PHYSICAL EXAM Left chest without hematoma or ecchymosis Airway #2 Well developed and nourished in mod resp distress  HENT normal Neck supple with JVP->10 Carotids brisk and full without bruits Crackles :L>R Rapid and irregular  no murmurs or gallops Abd-soft with active BS without hepatomegaly No Clubbing cyanosis edema Skin-warm and dry A & Oriented  Grossly normal sensory and motor function \    Intake/Output Summary (Last 24 hours) at 06/22/12 0618 Last data filed at 06/22/12 0006  Gross per 24 hour  Intake 3433.21 ml  Output    801 ml  Net 2632.21 ml    LABS: Basic Metabolic Panel:  Basename 06/21/12 0525 06/20/12 0420  NA 138 139  K 4.1 3.7  CL 102 104  CO2 25 22  GLUCOSE 94 87  BUN 18 17  CREATININE 1.18* 0.97  CALCIUM 8.8 8.7  MG -- --  PHOS -- --   Liver Function Tests:  Basename 06/21/12 0525  AST 22  ALT 34  ALKPHOS 73  BILITOT 0.4  PROT 6.2  ALBUMIN 2.7*   CBC:  Basename 06/21/12 0525 06/20/12 0420  WBC 7.5 7.2  NEUTROABS -- --  HGB 9.4* 9.5*  HCT 28.6* 28.0*  MCV 95.7 96.9  PLT 197 185   Fasting Lipid Panel:  Basename 06/21/12 0525  CHOL --  HDL --  LDLCALC --  TRIG 114  CHOLHDL --  LDLDIRECT --   INR: 2.52 06-21-2012,  pending today  Radiology/Studies:  Final result pending, LV lead pulled back   DEVICE INTERROGATION: Device interrogation pending   Plan  Discussed with pt and Dr Mclean.  Will plan to proceed with AV ablation today, realizing there is some likelihood of clinical deterioration with worsening of MR     

## 2012-06-22 NOTE — CV Procedure (Signed)
[name] [name] [id number]  [id number]  Preop Dx:Afib Postop Dx same/   Procedure:His bundle maeasurement with AV ablation CVP measureemnt >>15-18 mm Cx: None  SOB  Worsening during procedure Rx IV lasix + foley IV fentanyl   Dictation number [id number]  Sherryl Manges, MD 06/22/2012 10:13 AM

## 2012-06-22 NOTE — Op Note (Signed)
NAMECURSTIN, SCHMALE NO.:  [id number]  MEDICAL RECORD NO.:  [id number]  LOCATION:  [facility]                         FACILITY:  [facility]  PHYSICIAN:  Doylene Canning. Ladona Ridgel, MD    DATE OF BIRTH:  01-Mar-1956  DATE OF PROCEDURE:  06/21/2012 DATE OF DISCHARGE:                              OPERATIVE REPORT   PROCEDURE PERFORMED:  Insertion of a biventricular pacemaker.  The indication is chronic nonischemic cardiomyopathy with chronic systolic heart failure, ejection fraction 35%, uncontrolled atrial fibrillation with a rapid ventricular response and bundle branch block with QRS duration of 150 milliseconds.  INTRODUCTION:  The patient is a 56 year old woman, who has been in the hospital over a week with uncontrolled atrial fibrillation despite medical therapy with AV nodal blocking drugs and amiodarone.  She is now referred for biventricular pacemaker.  Plan is to undergo AV node ablation once the pacemaker is placed.  PROCEDURE:  After informed consent was obtained, the patient was taken to diagnostic EP lab in a fasting state.  After usual preparation and draping, intravenous fentanyl and midazolam was given for sedation.  A 30 mL of lidocaine was infiltrated into the left infraclavicular region. A 6 cm incision was carried out over this region and electrocautery was utilized to dissect down to the fascial plane.  The left subclavian vein was then punctured x3.  The Medtronic model [id number], 52-cm active fixation pacing lead, serial number [id number] was advanced into the right ventricle and the Medtronic model 5076, 45 cm active fixation pacing lead, serial number [id number] was advanced into the right atrium. Mapping was carried out in the right atrium at the final site. Fibrillation waves measured 0.8 mV and the AOO pacing mode was 460 ohms. With these satisfactory parameters, attention was then turned to placement of the left ventricular lead.  The coronary sinus  guiding catheter was advanced into the coronary sinus with modest difficulty. The coronary sinus was more posteriorly directed than would be expected. Having accomplished this, venography of the coronary sinus was carried out.  Initial venography demonstrated no suitable lateral or posterior veins.  Initially, the coronary sinus appeared to be free of all coronary veins.  At this point, the balloon occlusive catheter was advanced into the coronary sinus and occlusive venography was carried out in the coronary sinus.  This demonstrated a very diminutive tortuous lateral vein with a shepherd's crook.  This vein coursed posteriorly. It also demonstrated an anterior vein which was diminutive and a high lateral vein which was of borderline size.  It was also quite tortuous. Initially attempts were made to place the LV lead in the lateral vein. The vein was successfully cannulated with multiple guidewires.  Attempts to advance the guidewires into the lateral vein were successful, however, a distance of only about 1/3rd from base to apex could be made with the guidewire.  Attempts to advance the Medtronic [id number] bipolar LV pacing lead over the guidewire resulted in dislodgement of the guidewire from the lateral vein.  At least 1 hour was spent attempting to place the lead in this lateral vein.  At this point, the high lateral vein was selected for LV lead placement.  The lead was placed approximately halfway out from base to apex.  At multiple different locations, there was diaphragmatic stimulation at pacing outputs down to 1.5 V.  At the final site, the pacing threshold was less than a volt at 0.5 msec.  This was suboptimal in result.  It was deemed the only option at this point. The LV lead was liberated from the guiding catheter in the usual manner. At this point, all of the leads were secured to the subpectoral fascia with a figure-of-eight silk suture.  The sewing sleeve was secured  with silk suture as well.  Electrocautery was then utilized to make a subcutaneous pocket.  Antibiotic irrigation was utilized to irrigate the pocket and electrocautery was utilized to assure hemostasis.  The Medtronic biventricular pacemaker, serial number [id number] was connected to atrial RV and LV leads and placed back in the subcutaneous pocket.  The pocket was irrigated with additional antibiotic irrigation and incision was closed with 2-0 and 3-0 Vicryl.  Benzoin and Steri-Strips were painted on the skin, a pressure dressing applied, and the patient was returned to her room in satisfactory condition.  Of note, the patient did not undergo AV node ablation secondary to the long duration of the initial case.     Doylene Canning. Ladona Ridgel, MD     GWT/MEDQ  D:  06/21/2012  T:  06/22/2012  Job:  [id number]  cc:   [name], MD

## 2012-06-22 NOTE — H&P (View-Only) (Signed)
   ELECTROPHYSIOLOGY ROUNDING NOTE    Patient Name: [name] [name] Date of Encounter: 06-22-2012    SUBJECTIVE:Patient s/p CRT-P implantation 06-21-2012 for afib with RVR despite maximal medical therapy and non-ischemic cardiomyopathy.  LV lead with no capture except for diaphragmatic stim.    Feels ok.  No chest pain, shortness of breath stable.  Minimal incisional soreness.   TELEMETRY: Reviewed telemetry pt in atrial fib with RVR, rates 100's Filed Vitals:   06/21/12 2147 06/21/12 2247 06/21/12 2344 06/22/12 0400  BP: 101/76 102/78 102/79 107/77  Pulse: 76 116 112 76  Temp:    97.3 F (36.3 C)  TempSrc:    Oral  Resp:    18  Height:      Weight:    115 lb 11.9 oz (52.5 kg)  SpO2:    97%  PHYSICAL EXAM Left chest without hematoma or ecchymosis Airway #2 Well developed and nourished in mod resp distress  HENT normal Neck supple with JVP->10 Carotids brisk and full without bruits Crackles :L>R Rapid and irregular  no murmurs or gallops Abd-soft with active BS without hepatomegaly No Clubbing cyanosis edema Skin-warm and dry A & Oriented  Grossly normal sensory and motor function \    Intake/Output Summary (Last 24 hours) at 06/22/12 0618 Last data filed at 06/22/12 0006  Gross per 24 hour  Intake 3433.21 ml  Output    801 ml  Net 2632.21 ml    LABS: Basic Metabolic Panel:  Basename 06/21/12 0525 06/20/12 0420  NA 138 139  K 4.1 3.7  CL 102 104  CO2 25 22  GLUCOSE 94 87  BUN 18 17  CREATININE 1.18* 0.97  CALCIUM 8.8 8.7  MG -- --  PHOS -- --   Liver Function Tests:  [facility] 06/21/12 0525  AST 22  ALT 34  ALKPHOS 73  BILITOT 0.4  PROT 6.2  ALBUMIN 2.7*   CBC:  Basename 06/21/12 0525 06/20/12 0420  WBC 7.5 7.2  NEUTROABS -- --  HGB 9.4* 9.5*  HCT 28.6* 28.0*  MCV 95.7 96.9  PLT 197 185   Fasting Lipid Panel:  Basename 06/21/12 0525  CHOL --  HDL --  LDLCALC --  TRIG 114  CHOLHDL --  LDLDIRECT --   INR: 2.52 06-21-2012,  pending today  Radiology/Studies:  Final result pending, LV lead pulled back   DEVICE INTERROGATION: Device interrogation pending   Plan  Discussed with pt and Dr Shirlee Latch.  Will plan to proceed with AV ablation today, realizing there is some likelihood of clinical deterioration with worsening of MR

## 2012-06-22 NOTE — Clinical Social Work Note (Signed)
Late entry 06/22/12 for 06/21/12  Clinical Social Work Department BRIEF PSYCHOSOCIAL ASSESSMENT 06/22/2012  Patient:  [name] [name], [name] [name]     Account Number:  [id number]     Admit date:  06/12/2012  Clinical Social Worker:  Thomasene Mohair  Date/Time:  06/21/2012 12:00 N  Referred by:  RN  Date Referred:  06/21/2012 Referred for  SNF Placement   Other Referral:   Interview type:  Patient Other interview type:   Sister    PSYCHOSOCIAL DATA Living Status:  ALONE Admitted from facility:   Level of care:   Primary support name:  Carney Bern Harris/[phone] Primary support relationship to patient:  SIBLING Degree of support available:   [facility] Son [phone]  [place] Pho [phone]  Nonda Lou Sister [phone]   [phone]  Alverda Skeans Sister [phone]   [phone]  Remonia Richter Sister [phone]   [phone]    CURRENT CONCERNS Current Concerns  Post-Acute Placement   Other Concerns:    SOCIAL WORK ASSESSMENT / PLAN CSW was referred to Pt to assist with dc planning. Pt is 1 or 6 children, she has 2 adult sons and 5 grandchildren. She has worked for over 5 years at [employer] and is on short-term disability at this time. Pt plans to return to work once physically able.  Pt is very close to her siblings and they offer her a lot of support, attending to her personal needs. One of Pt's sisters is at bedside during CSW visit and stated that the other sisters are equally involved and supportive of Pt.  CSW discussed completing Advance Directives and HCPOA while in the hospital. Family advised that hospital does not do financial POA but that they can bring in a notary if needed.  Pt and her phsyicians have discussed DC planning and the recommendation is SNF. Pt and her family are familiar with options in their area and are agreeable to CSW beginning bed search. CSW will begin search and continue to follow Pt as her treatment progresses.   Assessment/plan status:   Psychosocial Support/Ongoing Assessment of Needs Other assessment/ plan:   Information/referral to community resources:   Advance Directives/HCPOA    PATIENT'S/FAMILY'S RESPONSE TO PLAN OF CARE: Pt states that she is doing well and has the support of her family while she is in the hospital. Pt states that her sister Carney Bern is the one that helps her with her healthcare needs. Pt's sister Carney Bern was at bedside and is supportive of SNF at dc. Family verbalized understanding that Pt's insurance requires preauthorization prior to going to SNF.   Frederico Hamman, LCSW [phone]

## 2012-06-22 NOTE — Progress Notes (Signed)
Patient ID: [name] [name], female   DOB: 1956/05/25, 56 y.o.   MRN: [id number]    SUBJECTIVE:  DCCV yesterday failed.  Amiodarone now off.  Attempted BiV device placement yesterday but unable to position LV lead due to poor veins.  HR continues to be in the 120s-130s.         . [COMPLETED] bupivacaine      . [COMPLETED] ceFAZolin      .  ceFAZolin (ANCEF) IV  1.5 g Intravenous Q6H  . digoxin  0.125 mg Oral Daily  . [COMPLETED] fentaNYL      . furosemide  40 mg Intravenous BID  . [COMPLETED] heparin      . insulin aspart  2-6 Units Subcutaneous TID WC  . [COMPLETED] lidocaine      . lisinopril  5 mg Oral BID  . magnesium oxide  400 mg Oral Daily  . metoprolol succinate  100 mg Oral BID  . [COMPLETED] midazolam      . [COMPLETED] midazolam      . multivitamin with minerals  1 tablet Oral Daily  . pantoprazole  40 mg Oral Q1200  . sodium chloride  3 mL Intravenous Q12H  . warfarin  5 mg Oral ONCE-1800  . Warfarin - Pharmacist Dosing Inpatient   Does not apply q1800  . [DISCONTINUED]  ceFAZolin (ANCEF) IV  2 g Intravenous On Call  . [DISCONTINUED] gentamicin irrigation  80 mg Irrigation On Call  . [DISCONTINUED] insulin aspart  2-6 Units Subcutaneous Q4H  . [DISCONTINUED] metoprolol succinate  75 mg Oral BID  . [DISCONTINUED] sodium chloride  3 mL Intravenous Q12H      Filed Vitals:   06/21/12 2147 06/21/12 2247 06/21/12 2344 06/22/12 0400  BP: 101/76 102/78 102/79 107/77  Pulse: 76 116 112 76  Temp:    97.3 F (36.3 C)  TempSrc:    Oral  Resp:    18  Height:      Weight:    115 lb 11.9 oz (52.5 kg)  SpO2:    97%    LABS: Basic Metabolic Panel:  Basename 06/21/12 0525 06/20/12 0420  NA 138 139  K 4.1 3.7  CL 102 104  CO2 25 22  GLUCOSE 94 87  BUN 18 17  CREATININE 1.18* 0.97  CALCIUM 8.8 8.7  MG -- --  PHOS -- --   Liver Function Tests:  Basename 06/21/12 0525  AST 22  ALT 34  ALKPHOS 73  BILITOT 0.4  PROT 6.2  ALBUMIN 2.7*   No results found for  this basename: LIPASE:2,AMYLASE:2 in the last 72 hours CBC:  Basename 06/22/12 0640 06/21/12 0525  WBC 7.3 7.5  NEUTROABS -- --  HGB 9.5* 9.4*  HCT 28.2* 28.6*  MCV 97.6 95.7  PLT 207 197   Cardiac Enzymes: No results found for this basename: CKTOTAL:3,CKMB:3,CKMBINDEX:3,TROPONINI:3 in the last 72 hours BNP: No components found with this basename: POCBNP:3 D-Dimer: No results found for this basename: DDIMER:2 in the last 72 hours Hemoglobin A1C: No results found for this basename: HGBA1C in the last 72 hours Fasting Lipid Panel:  Basename 06/21/12 0525  CHOL --  HDL --  LDLCALC --  TRIG 114  CHOLHDL --  LDLDIRECT --   Thyroid Function Tests: No results found for this basename: TSH,T4TOTAL,FREET3,T3FREE,THYROIDAB in the last 72 hours Anemia Panel: No results found for this basename: VITAMINB12,FOLATE,FERRITIN,TIBC,IRON,RETICCTPCT in the last 72 hours   PHYSICAL EXAM General: NAD, frail Neck: JVP 12 cm, no thyromegaly or  thyroid nodule.  Lungs: Slight decreased breath sounds at bases bilaterally.  CV: Nondisplaced PMI.  Heart regular S1/S2, no S3/S4, 1/6 HSM apex.  No peripheral edema.  No carotid bruit.  Normal pedal pulses.  Abdomen: Soft, nontender, no hepatosplenomegaly, no distention.  Neurologic: Alert and oriented x 3.  Psych: Normal affect. Extremities: No clubbing or cyanosis.   TELEMETRY: Reviewed telemetry pt in atrial fibrillation with RVR in 100s currently  ASSESSMENT AND PLAN: 1. Cardiomyopathy: Nonischemic, cath with minimal CAD.  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   - Continue lisinopril. - Continue Toprol XL 100 mg bid.  - She remains volume overloaded on exam.  Continue IV Lasix, record I/Os. BMET still pending.  2. Atrial fibrillation: With RVR.  Has history of PAF prior to admission.  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  Failed TEE-guided DCCV Monday and Wednesday after amiodarone re-load.  Still going very fast at times.  TSH was  normal earlier in this hospitalization.  - Amiodarone discontinued   - Continue Toprol XL and digoxin. - Plan for AV nodal ablation and pacing.  Dual chamber PCM placed yesterday but unable to place LV lead.  AV nodal ablation today. If patient's LV function does not improve after AV node ablation and slowing of heart rate, may need epicardial LV lead for cardiac resynchronization.  3. Cardiac arrest: Asystole in the setting of hyperkalemia.  4. ? Aspiration PNA: Completed course of Unasyn, off antibiotics.  5. PT.  Will likely need rehab when ready for discharge.   [name] [name] 06/22/2012 8:21 AM

## 2012-06-22 NOTE — Progress Notes (Signed)
Coumadin per Rx  AC: Coum-Rx: Remains in AFib RVR, resumed s/p clean cath. Home dose = 5mg  qday.   56yo female with AFib on coumadin. Patient failed TEE-guided DCCV 11/18 and again 11/20.  Had ablation today. INR therapeutic today at 2.1. CBC stable. No problems noted in chart. Off amiodarone.  Somehow the dose last night was not given??  Plan: 1.Coumadin 5 mg po x 1 dose 2. Daily INR

## 2012-06-22 NOTE — Progress Notes (Signed)
Increase of pvc's noticed on cardiac monitor.  ekg done and pt in trigeminy.  notifed dr. Graciela Husbands.  Pt asymptomatic.

## 2012-06-22 NOTE — Interval H&P Note (Signed)
History and Physical Interval Note:  06/22/2012 8:45 AM  [name] [name]  has presented today for surgery, with the diagnosis of a  The various methods of treatment have been discussed with the patient and family. After consideration of risks, benefits and other options for treatment, the patient has consented to  Procedure(s) (LRB) with comments: AV NODE ABLATION (N/A) as a surgical intervention .  The patient's history has been reviewed, patient examined, no change in status, stable for surgery.  I have reviewed the patient's chart and labs.  Questions were answered to the patient's satisfaction.     Sherryl Manges

## 2012-06-22 NOTE — Plan of Care (Signed)
Problem: Phase III Progression Outcomes Goal: Sinus rhythm established or heart rate < 100 at rest Outcome: Not Progressing For AV ablation today. Dairon Procter, Randall An RN

## 2012-06-22 NOTE — Progress Notes (Signed)
Pt feeling better with less sob  RR 18 bp 95-115

## 2012-06-23 DIAGNOSIS — I4891 Unspecified atrial fibrillation: Secondary | ICD-10-CM

## 2012-06-23 DIAGNOSIS — I5022 Chronic systolic (congestive) heart failure: Secondary | ICD-10-CM

## 2012-06-23 LAB — DIGOXIN LEVEL: Digoxin Level: 0.3 ng/mL — ABNORMAL LOW (ref 0.8–2.0)

## 2012-06-23 LAB — GLUCOSE, CAPILLARY
Glucose-Capillary: 90 mg/dL (ref 70–99)
Glucose-Capillary: 126 mg/dL — ABNORMAL HIGH (ref 70–99)

## 2012-06-23 MED ORDER — METOPROLOL SUCCINATE ER 100 MG PO TB24
100.0000 mg | ORAL_TABLET | Freq: Every day | ORAL | Status: DC
  Administered 2012-06-23 – 2012-06-27 (×5): 100 mg via ORAL
  Filled 2012-06-23 (×7): qty 1

## 2012-06-23 MED ORDER — WARFARIN SODIUM 7.5 MG PO TABS
7.5000 mg | ORAL_TABLET | Freq: Once | ORAL | Status: AC
  Administered 2012-06-23: 7.5 mg via ORAL
  Filled 2012-06-23 (×2): qty 1

## 2012-06-23 MED ORDER — LISINOPRIL 10 MG PO TABS
10.0000 mg | ORAL_TABLET | Freq: Two times a day (BID) | ORAL | Status: DC
  Administered 2012-06-23 – 2012-06-27 (×10): 10 mg via ORAL
  Filled 2012-06-23 (×14): qty 1

## 2012-06-23 NOTE — Progress Notes (Signed)
Transferred to [id number] by wheelchair stable, belongings with pt. Report given to RN.

## 2012-06-23 NOTE — Clinical Social Work Placement (Signed)
     Clinical Social Work Department CLINICAL SOCIAL WORK PLACEMENT NOTE 06/23/2012  Patient:  [name] [name], [name] [name]  Account Number:  [id number] Admit date:  06/12/2012  Clinical Social Worker:  Frederico Hamman, LCSW  Date/time:  06/22/2012 12:00 N  Clinical Social Work is seeking post-discharge placement for this patient at the following level of care:   SKILLED NURSING   (*CSW will update this form in Epic as items are completed)   06/21/2012  Patient/family provided with [facility] Department of Clinical Social Works list of facilities offering this level of care within the geographic area requested by the patient (or if unable, by the patients family).  06/21/2012  Patient/family informed of their freedom to choose among providers that offer the needed level of care, that participate in Medicare, Medicaid or managed care program needed by the patient, have an available bed and are willing to accept the patient.  06/21/2012  Patient/family informed of [facility] ownership interest in [facility], as well as of the fact that they are under no obligation to receive care at this facility.  PASARR submitted to EDS on 06/22/2012 PASARR number received from EDS on   FL2 transmitted to all facilities in geographic area requested by pt/family on  06/22/2012 FL2 transmitted to all facilities within larger geographic area on   Patient informed that his/her managed care company has contracts with or will negotiate with  certain facilities, including the following:     Patient/family informed of bed offers received:  06/23/2012 Patient chooses bed at  Physician recommends and patient chooses bed at    Patient to be transferred to  on   Patient to be transferred to facility by   The following physician request were entered in Epic:   Additional Comments:

## 2012-06-23 NOTE — Progress Notes (Signed)
Physical Therapy Treatment Patient Details Name: [name] [name] MRN: [id number] DOB: Dec 16, 1955 Today's Date: 06/23/2012 Time: 1212-1238 PT Time Calculation (min): 26 min  PT Assessment / Plan / Recommendation Comments on Treatment Session  S/p AVN ablation 06-22-2012.  CRT-P implanted 06-21-2012.  Moving well but still unsteady with gait. HR well controlled at or around 89-90 bpm during ambulation. No SOB. Did fatigue towards the end of the session with exercises.  Continue to rec. SNF.  Goals updated.     Follow Up Recommendations  SNF     Does the patient have the potential to tolerate intense rehabilitation     Barriers to Discharge        Equipment Recommendations  None recommended by PT    Recommendations for Other Services    Frequency Min 3X/week   Plan Discharge plan remains appropriate;Frequency remains appropriate    Precautions / Restrictions Precautions Precautions: Fall;ICD/Pacemaker Restrictions Weight Bearing Restrictions: No   Pertinent Vitals/Pain HR maintained at or around 89-90 bpm with ambulation.     Mobility  Bed Mobility Bed Mobility: Not assessed Details for Bed Mobility Assistance: pt upright in chair Transfers Sit to Stand: 4: Min guard;From chair/3-in-1;With armrests;With upper extremity assist (with RUE only) Stand to Sit: 5: Supervision;To chair/3-in-1 Details for Transfer Assistance: gaurding for stability as pt with slight LOB upon standing needing minA HHA upon standing Ambulation/Gait Ambulation/Gait Assistance: 4: Min assist;4: Min guard Ambulation Distance (Feet): 380 Feet Assistive device: 1 person hand held assist Ambulation/Gait Assistance Details: RUE HHA as well as min faciliation around trunk initially, as walking progressed pt able to ambulate with close gaurding and occassional min tactile assist to catch her balance Gait Pattern: Narrow base of support General Gait Details: increased lateral sway, generally weak lower  extremities    Exercises General Exercises - Lower Extremity Hip ABduction/ADduction: AROM;Both;10 reps;Standing Hip Flexion/Marching: AROM;Both;10 reps;Standing Toe Raises: AROM;Both;20 reps;Seated Heel Raises: AROM;Both;20 reps;Seated   PT Diagnosis:    PT Problem List:   PT Treatment Interventions:     PT Goals Acute Rehab PT Goals Time For Goal Achievement: 06/30/12 Pt will go Sit to Stand: Independently PT Goal: Sit to Stand - Progress: Goal set today Pt will go Stand to Sit: Independently PT Goal: Stand to Sit - Progress: Goal set today Pt will Transfer Bed to Chair/Chair to Bed: Independently PT Transfer Goal: Bed to Chair/Chair to Bed - Progress: Goal set today Pt will Ambulate: Independently;>150 feet PT Goal: Ambulate - Progress: Progressing toward goal Pt will Perform Home Exercise Program: Independently PT Goal: Perform Home Exercise Program - Progress: Progressing toward goal  Visit Information  Last PT Received On: 06/23/12 Assistance Needed: +1    Subjective Data  Subjective: My shoulder is sore. (with regards to pacemaker)   Cognition  Overall Cognitive Status: Appears within functional limits for tasks assessed/performed Arousal/Alertness: Awake/alert Orientation Level: Appears intact for tasks assessed Behavior During Session: [facility] for tasks performed    Balance  Static Standing Balance Static Standing - Balance Support: No upper extremity supported Static Standing - Level of Assistance: 5: Stand by assistance Static Standing - Comment/# of Minutes: stood at 3 minutes without upper extremity support, close gauring as pt with quick LOB any time her weight shifts outside her BOS Dynamic Standing Balance Dynamic Standing - Balance Support: Right upper extremity supported Dynamic Standing - Level of Assistance: 5: Stand by assistance Dynamic Standing - Balance Activities: Lateral lean/weight shifting Dynamic Standing - Comments: with RUE support no LOB  noted  End of Session PT - End of Session Equipment Utilized During Treatment: Gait belt Activity Tolerance: Patient tolerated treatment well Patient left: in chair;with call bell/phone within reach Nurse Communication: Mobility status   GP     [facility] HELEN 06/23/2012, 2:00 PM

## 2012-06-23 NOTE — Progress Notes (Signed)
ELECTROPHYSIOLOGY ROUNDING NOTE    Patient Name: [name] [name] Date of Encounter: 06-23-2012    SUBJECTIVE:Patient feels better this morning.  Shortness of breath improved.  No chest pain. 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  CRT-P implanted 06-21-2012, however, no good options for LV lead placement and currently no capture of LV lead. Pt RV only pacing.    TELEMETRY: Reviewed telemetry pt in atrial fibrillation with ventricular pacing at 90bpm, PVC's Filed Vitals:   06/23/12 0300 06/23/12 0400 06/23/12 0500 06/23/12 0600  BP: 111/67 114/78 115/61 113/69  Pulse: 89 89 88 89  Temp:  98.9 F (37.2 C)    TempSrc:  Oral    Resp: 26 15 21 23   Height:      Weight:   113 lb 5.1 oz (51.4 kg)   SpO2: 98% 97% 98% 99%    Intake/Output Summary (Last 24 hours) at 06/23/12 0643 Last data filed at 06/23/12 0600  Gross per 24 hour  Intake  886.5 ml  Output   4550 ml  Net -3663.5 ml    PHYSICAL EXAM Left chest with dressing intact.  No hematoma.  Well developed and nourished in mild  acute distress HENT normal Neck supple with JVP 9-10 Carotids brisk and full without bruits Crackles with decreased breath sounds Regular rate and rhythm, no murmurs or gallops Abd-soft with active BS without hepatomegaly No Clubbing cyanosis  Tr edema Skin-warm and dry A & Oriented  Grossly normal sensory and motor function   LABS: Basic Metabolic Panel:  Basename 06/22/12 0640 06/21/12 0525  NA 134* 138  K 4.5 4.1  CL 99 102  CO2 22 25  GLUCOSE 100* 94  BUN 18 18  CREATININE 0.98 1.18*  CALCIUM 8.5 8.8  MG -- --  PHOS -- --   Liver Function Tests:  [facility] 06/21/12 0525  AST 22  ALT 34  ALKPHOS 73  BILITOT 0.4  PROT 6.2  ALBUMIN 2.7*   CBC:  Basename 06/22/12 0640 06/21/12 0525  WBC 7.3 7.5  NEUTROABS -- --  HGB 9.5* 9.4*  HCT 28.2* 28.6*  MCV 97.6 95.7  PLT 207 197   Fasting Lipid Panel:  Basename 06/21/12 0525  CHOL --  HDL --  LDLCALC --  TRIG 114  CHOLHDL --    LDLDIRECT --   INR: 1.56  Radiology/Studies:  Dg Chest 2 View 06/22/2012  *RADIOLOGY REPORT*  Clinical Data: Post pacemaker insertion, chest soreness  CHEST - 2 VIEW  Comparison: Portable chest x-ray of 06/14/2012  Findings: The endotracheal tube and NG tube have been removed.  No pneumothorax is seen.  Permanent pacemaker is now present. Cardiomegaly is stable and there may be minimal pulmonary vascular congestion present.  IMPRESSION:  1.  Permanent pacemaker now present.  No pneumothorax. 2.  Cardiomegaly.  Question mild pulmonary vascular congestion. 3.  Endotracheal tube removed.   Original Report Authenticated By: Dwyane Dee, M.D.     DEVICE INTERROGATION: Device interrogated by industry 06-22-2012.  Lead values including impedence, sensing, threshold within normal values for RA and RV.  No capture LV lead.      Patient Active Hospital Problem List:  Afib with RVR   Nonischemic cardiomyopathy (06/12/2012)  * Chronic systolic CHF (congestive heart failure) (06/12/2012)     Pt much improved post AV ablation;  Will continue diuresis and adjust meds  May be candidate for aldactone but hx of hyperkalemia  Transfer to floor with pt today  ? Home monday

## 2012-06-23 NOTE — Op Note (Signed)
NAMELEINANI, URBANSKI NO.:  [id number]  MEDICAL RECORD NO.:  [id number]  LOCATION:  2928                         FACILITY:  [facility]  PHYSICIAN:  Duke Salvia, MD, FACCDATE OF BIRTH:  12-11-1955  DATE OF PROCEDURE: DATE OF DISCHARGE:                              OPERATIVE REPORT   PREOPERATIVE DIAGNOSIS:  Atrial fibrillation with uncontrolled ventricular response, status post pacemaker implantation.  POSTOPERATIVE DIAGNOSIS:  Atrial fibrillation with uncontrolled ventricular response, status post pacemaker implantation.  PROCEDURE:  His bundle measurements and catheter ablation of the AV junction.  DESCRIPTION OF PROCEDURE:  After obtaining informed consent, the patient was brought to the Electrophysiology Laboratory and placed on the fluoroscopic table in supine position.  After routine prep and drape, cardiac catheterization was performed with local anesthesia and conscious sedation.  Noninvasive blood pressure monitoring, end-tidal CO2 monitoring, and oxygen saturation monitoring were performed continuously throughout the procedure.  Following the procedure, the catheter was removed, the sheath was replaced with a short sheath and the patient was transferred to the holding area in stable condition.  COMPLICATIONS:  The patient presented to the lab with respiratory rate of about 30.  At the end of the procedure, she had some complaints of increase in shortness of breath.  This was treated with IV Lasix, IV fentanyl, and increasing wedge support.  This was associated with amelioration of her symptoms.  CATHETERS:  A 7-French 4-mm deflectable tip ablation catheter was inserted via the right femoral vein using an SR0 sheath to mapping sites in the AV groove.  With great difficulty, we found an HV interval.  It was in the 90-110 range.  The patient had underlying right bundle branch block.  Electrograms were very very fragmented.  Multiple applications of  RF were applied with a total number of applications being 13 and total ablation time of 8 minutes and 1 second before we finally accomplished heart block.  She had a residual junctional rhythm at 1370 milliseconds with a QRS duration of 184, a QT interval of 406 milliseconds.  Fluoroscopy time was 10 minutes and 6 seconds.  As noted, the patient tolerated the procedure reasonably.  The patient will be transferred to the step-down given the tenuous respiratory status noted previously and even prior to arriving to the lab.  She will be paced at 90 beats per minute.     Duke Salvia, MD, [facility]     SCK/MEDQ  D:  06/22/2012  T:  06/23/2012  Job:  [id number]

## 2012-06-23 NOTE — Progress Notes (Signed)
ANTICOAGULATION CONSULT NOTE - Follow Up Consult  Pharmacy Consult for Coumadin Indication: atrial fibrillation  No Known Allergies  Patient Measurements: Height: 5' (152.4 cm) Weight: 113 lb 5.1 oz (51.4 kg) IBW/kg (Calculated) : 45.5   Vital Signs: Temp: 98.6 F (37 C) (11/22 0731) Temp src: Oral (11/22 0731) BP: 106/45 mmHg (11/22 1000) Pulse Rate: 90  (11/22 0910)  Labs:  Basename 06/23/12 0439 06/22/12 0640 06/21/12 0525  HGB -- 9.5* 9.4*  HCT -- 28.2* 28.6*  PLT -- 207 197  APTT -- -- --  LABPROT 18.2* 22.7* 26.0*  INR 1.56* 2.10* 2.52*  HEPARINUNFRC -- -- --  CREATININE -- 0.98 1.18*  CKTOTAL -- -- --  CKMB -- -- --  TROPONINI -- -- --    Estimated Creatinine Clearance: 45.5 ml/min (by C-G formula based on Cr of 0.98).   Assessment: 56yo female with AFib on coumadin, s/p ablation on 11/21. INR down to 1.56, likely due to missing dose on 11/20. Off amiodarone, no new cbc, no bleeding per chart. Home dose = 5mg  daily which seems sufficient to keep her INR therapeutic.    Goal of Therapy:  INR 2-3 Monitor platelets by anticoagulation protocol: Yes   Plan:  - Coumadin 7.5mg  po x 1 - f/u daily INR  Bayard Hugger, PharmD, BCPS  Clinical Pharmacist  Pager: [phone]  06/23/2012,10:18 AM

## 2012-06-24 LAB — CBC
MCV: 95.3 fL (ref 78.0–100.0)
Platelets: 264 10*3/uL (ref 150–400)
RBC: 3.38 MIL/uL — ABNORMAL LOW (ref 3.87–5.11)
RDW: 15.2 % (ref 11.5–15.5)
WBC: 7.7 10*3/uL (ref 4.0–10.5)

## 2012-06-24 LAB — PROTIME-INR: INR: 1.49 (ref 0.00–1.49)

## 2012-06-24 MED ORDER — WARFARIN SODIUM 7.5 MG PO TABS
7.5000 mg | ORAL_TABLET | Freq: Once | ORAL | Status: AC
  Administered 2012-06-24: 7.5 mg via ORAL
  Filled 2012-06-24: qty 1

## 2012-06-24 NOTE — Progress Notes (Signed)
ANTICOAGULATION CONSULT NOTE - Follow Up Consult  Pharmacy Consult for Coumadin Indication: atrial fibrillation  No Known Allergies  Patient Measurements: Height: 5\' 5"  (165.1 cm) Weight: 108 lb 4.8 oz (49.125 kg) (scale a) IBW/kg (Calculated) : 57   Vital Signs: Temp: 97.9 F (36.6 C) (11/23 0700) Temp src: Oral (11/23 0700) BP: 105/66 mmHg (11/23 0700) Pulse Rate: 90  (11/23 0700)  Labs:  Basename 06/24/12 0519 06/23/12 0439 06/22/12 0640  HGB 11.0* -- 9.5*  HCT 32.2* -- 28.2*  PLT 264 -- 207  APTT -- -- --  LABPROT 17.6* 18.2* 22.7*  INR 1.49 1.56* 2.10*  HEPARINUNFRC -- -- --  CREATININE -- -- 0.98  CKTOTAL -- -- --  CKMB -- -- --  TROPONINI -- -- --    Estimated Creatinine Clearance: 49.1 ml/min (by C-G formula based on Cr of 0.98).   Assessment: 56yo female with AFib on coumadin, s/p ablation on 11/21. INR down to 1.49, likely due to missing dose on 11/20. Off amiodarone, no new cbc, no bleeding per chart. Home dose = 5mg  daily which seems sufficient to keep her INR therapeutic.   Goal of Therapy:  INR 2-3 Monitor platelets by anticoagulation protocol: Yes   Plan:  - Coumadin 7.5mg  po x 1 - f/u daily INR  [facility], [place].D., BCPS Clinical Pharmacist Pager: [phone] 06/24/2012 1:44 PM

## 2012-06-24 NOTE — Progress Notes (Signed)
1610 Patient had a 5 beat run of v-tach this am. Patient was resting in bed and was asymptomatic at the time. VS were taken and documented MD on call for Dr.McLean was called and notified. No new orders written.

## 2012-06-24 NOTE — Progress Notes (Signed)
   ELECTROPHYSIOLOGY ROUNDING NOTE    Patient Name: [name] [name] Date of Encounter: 06-23-2012    SUBJECTIVE:Patient feels better  Considerably Shortness of breath improved.  No chest pain. 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  CRT-P implanted 06-21-2012, however, no good options for LV lead placement and currently no capture of LV lead. Pt RV only pacing.  ambualting a little   TELEMETRY: Reviewed telemetry pt in atrial fibrillation with ventricular pacing at 90bpm, PVC's Filed Vitals:   06/23/12 2030 06/23/12 2307 06/24/12 0504 06/24/12 0700  BP: 88/68 94/60 117/61 105/66  Pulse: 88 90 95 90  Temp: 98 F (36.7 C)  97.9 F (36.6 C) 97.9 F (36.6 C)  TempSrc: Oral  Oral Oral  Resp: 19  18 18   Height:      Weight:   108 lb 4.8 oz (49.125 kg)   SpO2: 97%  94% 94%    Intake/Output Summary (Last 24 hours) at 06/24/12 1011 Last data filed at 06/24/12 0500  Gross per 24 hour  Intake    540 ml  Output   1700 ml  Net  -1160 ml    PHYSICAL EXAM Left chest with dressing intact.  No hematoma.  Well developed and nourished in mild  acute distress HENT normal Neck supple with JVP 9-10 Carotids brisk and full without bruits Crackles with decreased breath sounds Regular rate and rhythm, no murmurs or gallops Abd-soft with active BS without hepatomegaly No Clubbing cyanosis  Tr edema Skin-warm and dry A & Oriented  Grossly normal sensory and motor function   LABS: Basic Metabolic Panel:  Basename 06/22/12 0640  NA 134*  K 4.5  CL 99  CO2 22  GLUCOSE 100*  BUN 18  CREATININE 0.98  CALCIUM 8.5  MG --  PHOS --   Liver Function Tests: No results found for this basename: AST:2,ALT:2,ALKPHOS:2,BILITOT:2,PROT:2,ALBUMIN:2 in the last 72 hours CBC:  Basename 06/24/12 0519 06/22/12 0640  WBC 7.7 7.3  NEUTROABS -- --  HGB 11.0* 9.5*  HCT 32.2* 28.2*  MCV 95.3 97.6  PLT 264 207   Fasting Lipid Panel: No results found for this basename: CHOL,HDL,LDLCALC,TRIG,CHOLHDL,LDLDIRECT  in the last 72 hours INR: 1.49  Radiology/Studies:  Dg Chest 2 View 06/22/2012  *RADIOLOGY REPORT*  Clinical Data: Post pacemaker insertion, chest soreness  CHEST - 2 VIEW  Comparison: Portable chest x-ray of 06/14/2012  Findings: The endotracheal tube and NG tube have been removed.  No pneumothorax is seen.  Permanent pacemaker is now present. Cardiomegaly is stable and there may be minimal pulmonary vascular congestion present.  IMPRESSION:  1.  Permanent pacemaker now present.  No pneumothorax. 2.  Cardiomegaly.  Question mild pulmonary vascular congestion. 3.  Endotracheal tube removed.   Original Report Authenticated By: Dwyane Dee, M.D.     DEVICE INTERROGATION: Device interrogated by industry 06-22-2012.  Lead values including impedence, sensing, threshold within normal values for RA and RV.  No capture LV lead.      Patient Active Hospital Problem List:  Afib with RVR   Nonischemic cardiomyopathy (06/12/2012)  * Chronic systolic CHF (congestive heart failure) (06/12/2012)     Pt much improved post AV ablation;  Will continue diuresis and adjust meds  May be candidate for aldactone but hx of hyperkalemia     ? Home monday

## 2012-06-25 LAB — CBC
Platelets: 289 10*3/uL (ref 150–400)
RBC: 3.55 MIL/uL — ABNORMAL LOW (ref 3.87–5.11)
RDW: 15.1 % (ref 11.5–15.5)
WBC: 5.6 10*3/uL (ref 4.0–10.5)

## 2012-06-25 LAB — BASIC METABOLIC PANEL
CO2: 27 mEq/L (ref 19–32)
Chloride: 96 mEq/L (ref 96–112)
GFR calc Af Amer: 89 mL/min — ABNORMAL LOW (ref >90)
Potassium: 3.7 mEq/L (ref 3.5–5.1)
Sodium: 135 mEq/L (ref 135–145)

## 2012-06-25 LAB — PROTIME-INR: INR: 1.48 (ref 0.00–1.49)

## 2012-06-25 MED ORDER — SODIUM CHLORIDE 0.9 % IJ SOLN
3.0000 mL | Freq: Two times a day (BID) | INTRAMUSCULAR | Status: DC
  Administered 2012-06-25 – 2012-06-28 (×7): 3 mL via INTRAVENOUS

## 2012-06-25 MED ORDER — FUROSEMIDE 40 MG PO TABS
40.0000 mg | ORAL_TABLET | Freq: Two times a day (BID) | ORAL | Status: DC
  Administered 2012-06-25 – 2012-06-26 (×2): 40 mg via ORAL
  Filled 2012-06-25 (×4): qty 1

## 2012-06-25 MED ORDER — WARFARIN SODIUM 5 MG PO TABS
5.0000 mg | ORAL_TABLET | Freq: Once | ORAL | Status: AC
  Administered 2012-06-25: 5 mg via ORAL
  Filled 2012-06-25: qty 1

## 2012-06-25 NOTE — Clinical Social Work Note (Signed)
CSW met with patient re: SNF choice. Patient requested CSW contact sister, Carney Bern [phone] on Monday re: choice. Patient and sister want [facility] if possible. Weekday CSW will follow up with sister re: d/c planning.  Lia Foyer, LCSWA [facility] Clinical Social Worker Contact #: [phone] (weekend)

## 2012-06-25 NOTE — Progress Notes (Signed)
ANTICOAGULATION CONSULT NOTE - Follow Up Consult  Pharmacy Consult for Coumadin Indication: atrial fibrillation  No Known Allergies  Patient Measurements: Height: 5\' 5"  (165.1 cm) Weight: 106 lb 4.8 oz (48.217 kg) (scale a) IBW/kg (Calculated) : 57   Vital Signs: Temp: 98.4 F (36.9 C) (11/24 0446) Temp src: Oral (11/24 0446) BP: 113/65 mmHg (11/24 0446) Pulse Rate: 90  (11/24 0446)  Labs:  Basename 06/25/12 0730 06/24/12 0519 06/23/12 0439  HGB 11.4* 11.0* --  HCT 33.6* 32.2* --  PLT 289 264 --  APTT -- -- --  LABPROT 17.5* 17.6* 18.2*  INR 1.48 1.49 1.56*  HEPARINUNFRC -- -- --  CREATININE -- -- --  CKTOTAL -- -- --  CKMB -- -- --  TROPONINI -- -- --    Estimated Creatinine Clearance: 48.2 ml/min (by C-G formula based on Cr of 0.98).   Assessment: 56yo female with AFib on Coumadin, s/p ablation on 11/21. INR is subtherapeutic, likely due to missing dose on 11/20. Off amiodarone, no new cbc, no bleeding per chart. Home dose = 5mg  daily. Expect INR to start trending up again shortly.  Goal of Therapy:  INR 2-3 Monitor platelets by anticoagulation protocol: Yes   Plan:  - Coumadin 5mg  po x 1 - f/u daily INR  [facility], [place].D., BCPS Clinical Pharmacist Pager: [phone] 06/25/2012 1:10 PM

## 2012-06-25 NOTE — Progress Notes (Signed)
   ELECTROPHYSIOLOGY ROUNDING NOTE    Patient Name: [name] [name] Date of Encounter: 06-23-2012    SUBJECTIVE:Patient feels better  Considerably Shortness of breath improved.  No chest pain. 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  CRT-P implanted 06-21-2012, however, no good options for LV lead placement and currently no capture of LV lead. Pt RV only pacing.  ambualting a little   TELEMETRY: Reviewed telemetry pt in atrial fibrillation with ventricular pacing at 90bpm, PVC's Filed Vitals:   06/24/12 0700 06/24/12 1407 06/24/12 2029 06/25/12 0446  BP: 105/66 86/59 102/64 113/65  Pulse: 90 89 89 90  Temp: 97.9 F (36.6 C) 97.2 F (36.2 C) 97.2 F (36.2 C) 98.4 F (36.9 C)  TempSrc: Oral Oral Oral Oral  Resp: 18 20 20 18   Height:      Weight:    106 lb 4.8 oz (48.217 kg)  SpO2: 94% 96% 95% 96%    Intake/Output Summary (Last 24 hours) at 06/25/12 1028 Last data filed at 06/25/12 0800  Gross per 24 hour  Intake    844 ml  Output   3550 ml  Net  -2706 ml    PHYSICAL EXAM Left chest with dressing intact.  No hematoma.  Well developed and nourished in mild  acute distress HENT normal Neck supple with JVP 7-8 Carotids brisk and full without bruits Crackles with decreased breath sounds Regular rate and rhythm, no murmurs or gallops Abd-soft with active BS without hepatomegaly No Clubbing cyanosis  Tr edema Skin-warm and dry A & Oriented  Grossly normal sensory and motor function   LABS: Basic Metabolic Panel: No results found for this basename: NA:2,K:2,CL:2,CO2:2,GLUCOSE:2,BUN:2,CREATININE:2,CALCIUM:2,MG:2,PHOS:2 in the last 72 hours Liver Function Tests: No results found for this basename: AST:2,ALT:2,ALKPHOS:2,BILITOT:2,PROT:2,ALBUMIN:2 in the last 72 hours CBC:  Basename 06/25/12 0730 06/24/12 0519  WBC 5.6 7.7  NEUTROABS -- --  HGB 11.4* 11.0*  HCT 33.6* 32.2*  MCV 94.6 95.3  PLT 289 264   Fasting Lipid Panel: No results found for this basename:  CHOL,HDL,LDLCALC,TRIG,CHOLHDL,LDLDIRECT in the last 72 hours INR: 1.48  Radiology/Studies:  Dg Chest 2 View 06/22/2012  *RADIOLOGY REPORT*  Clinical Data: Post pacemaker insertion, chest soreness  CHEST - 2 VIEW  Comparison: Portable chest x-ray of 06/14/2012  Findings: The endotracheal tube and NG tube have been removed.  No pneumothorax is seen.  Permanent pacemaker is now present. Cardiomegaly is stable and there may be minimal pulmonary vascular congestion present.  IMPRESSION:  1.  Permanent pacemaker now present.  No pneumothorax. 2.  Cardiomegaly.  Question mild pulmonary vascular congestion. 3.  Endotracheal tube removed.   Original Report Authenticated By: Dwyane Dee, M.D.     DEVICE INTERROGATION: Device interrogated by industry 06-22-2012.  Lead values including impedence, sensing, threshold within normal values for RA and RV.  No capture LV lead.      Patient Active Hospital Problem List:  Afib with RVR   Nonischemic cardiomyopathy (06/12/2012)  * Chronic systolic CHF (congestive heart failure) (06/12/2012)  CHB s/p AV ablation     Pt much improved post AV ablation;  Will continue diuresis and adjust meds  May be candidate for aldactone but hx of hyperkalemia   need repeat BMET  Change lasix IV>>po  D/c SNF Monday??

## 2012-06-26 LAB — CBC
Hemoglobin: 11.4 g/dL — ABNORMAL LOW (ref 12.0–15.0)
MCH: 33 pg (ref 26.0–34.0)
Platelets: 308 10*3/uL (ref 150–400)
RBC: 3.45 MIL/uL — ABNORMAL LOW (ref 3.87–5.11)
WBC: 6.1 10*3/uL (ref 4.0–10.5)

## 2012-06-26 LAB — BASIC METABOLIC PANEL
CO2: 29 mEq/L (ref 19–32)
Calcium: 9.5 mg/dL (ref 8.4–10.5)
Potassium: 4.2 mEq/L (ref 3.5–5.1)
Sodium: 135 mEq/L (ref 135–145)

## 2012-06-26 LAB — PROTIME-INR
INR: 1.52 — ABNORMAL HIGH (ref 0.00–1.49)
Prothrombin Time: 17.9 seconds — ABNORMAL HIGH (ref 11.6–15.2)

## 2012-06-26 LAB — DIGOXIN LEVEL: Digoxin Level: 0.7 ng/mL — ABNORMAL LOW (ref 0.8–2.0)

## 2012-06-26 MED ORDER — FUROSEMIDE 40 MG PO TABS
40.0000 mg | ORAL_TABLET | Freq: Every day | ORAL | Status: DC
  Administered 2012-06-27: 40 mg via ORAL
  Filled 2012-06-26 (×2): qty 1

## 2012-06-26 MED ORDER — WARFARIN SODIUM 7.5 MG PO TABS
7.5000 mg | ORAL_TABLET | Freq: Once | ORAL | Status: AC
  Administered 2012-06-26: 7.5 mg via ORAL
  Filled 2012-06-26: qty 1

## 2012-06-26 MED ORDER — CEPHALEXIN 250 MG PO CAPS
250.0000 mg | ORAL_CAPSULE | Freq: Three times a day (TID) | ORAL | Status: DC
  Administered 2012-06-26 – 2012-06-28 (×7): 250 mg via ORAL
  Filled 2012-06-26 (×9): qty 1

## 2012-06-26 NOTE — Progress Notes (Signed)
Patient's blood pressure read 89/55 on the Dinamap.  Manual BP was 90/60.  PA notified concerning BP medications.  RN told to give one now and one in an hour if the patient's blood pressure is at or above 90/60.  Will carry out orders and continue to monitor the patient.

## 2012-06-26 NOTE — Discharge Summary (Signed)
[name] [name], [name] [name]                ACCOUNT NO.:  [id number]  MEDICAL RECORD NO.:  [id number]  LOCATION:                                 FACILITY:  PHYSICIAN:  Acasia Skilton L. Juanetta Gosling, M.D.DATE OF BIRTH:  1955-09-22  DATE OF ADMISSION:  06/04/2012 DATE OF DISCHARGE:  11/08/2013LH                              DISCHARGE SUMMARY   DISCHARGE DIAGNOSIS:  Acute respiratory failure.   INCOMPLETE DICTATION.     Kennis Wissmann L. Juanetta Gosling, M.D.     ELH/MEDQ  D:  06/24/2012  T:  06/24/2012  Job:  [id number]

## 2012-06-26 NOTE — Progress Notes (Signed)
Nutrition Follow-up  Intervention:   1. Continue Ensure Complete PRN  2. RD will continue to follow    Assessment:   Waiting SNF placement now that SOB is improved. S/p ablation on 11/21.   Diet Order:  Heart Po intake: 75-100% Supplements: Ensure Complete, PRN   Meds: Scheduled Meds:   . digoxin  0.125 mg Oral Daily  . furosemide  40 mg Oral Daily  . lisinopril  10 mg Oral BID  . magnesium oxide  400 mg Oral Daily  . metoprolol succinate  100 mg Oral Daily  . multivitamin with minerals  1 tablet Oral Daily  . pantoprazole  40 mg Oral Q1200  . sodium chloride  3 mL Intravenous Q12H  . [COMPLETED] warfarin  5 mg Oral ONCE-1800  . warfarin  7.5 mg Oral ONCE-1800  . Warfarin - Pharmacist Dosing Inpatient   Does not apply q1800  . [DISCONTINUED] furosemide  40 mg Oral BID   Continuous Infusions:  PRN Meds:.feeding supplement, zolpidem   CMP     Component Value Date/Time   NA 135 06/26/2012 0500   K 4.2 06/26/2012 0500   CL 95* 06/26/2012 0500   CO2 29 06/26/2012 0500   GLUCOSE 94 06/26/2012 0500   BUN 23 06/26/2012 0500   CREATININE 0.87 06/26/2012 0500   CALCIUM 9.5 06/26/2012 0500   PROT 6.2 06/21/2012 0525   ALBUMIN 2.7* 06/21/2012 0525   AST 22 06/21/2012 0525   ALT 34 06/21/2012 0525   ALKPHOS 73 06/21/2012 0525   BILITOT 0.4 06/21/2012 0525   GFRNONAA 73* 06/26/2012 0500   GFRAA 84* 06/26/2012 0500    CBG (last 3)  No results found for this basename: GLUCAP:3 in the last 72 hours   Intake/Output Summary (Last 24 hours) at 06/26/12 1346 Last data filed at 06/26/12 1310  Gross per 24 hour  Intake    723 ml  Output   1225 ml  Net   -502 ml    Weight Status:  106 lbs, down from admission weight, -8L fluid status  Re-estimated needs: 1500-1700 kcal, 65-75 gm protein daily   Nutrition Dx: Inadequate oral intake now r/t poor appetite AEB only eating one meal daily PTA   Goal: Diet advance to meet >/=90% estimated nutrition needs.  --Met     Monitor: Diet advance, weight trends, labs, I/O's    Clarene Duke RD, LDN Pager [phone] After Hours pager [phone]  .

## 2012-06-26 NOTE — Progress Notes (Signed)
ANTICOAGULATION CONSULT NOTE - Follow Up Consult  Pharmacy Consult for Coumadin Indication: atrial fibrillation  No Known Allergies  Patient Measurements: Height: 5\' 5"  (165.1 cm) Weight: 106 lb 11.2 oz (48.4 kg) (scale A) IBW/kg (Calculated) : 57   Vital Signs: Temp: 98.1 F (36.7 C) (11/25 0451) Temp src: Oral (11/25 0451) BP: 111/65 mmHg (11/25 0451) Pulse Rate: 96  (11/25 0909)  Labs:  Basename 06/26/12 0500 06/25/12 1214 06/25/12 0730 06/24/12 0519  HGB 11.4* -- 11.4* --  HCT 33.2* -- 33.6* 32.2*  PLT 308 -- 289 264  APTT -- -- -- --  LABPROT 17.9* -- 17.5* 17.6*  INR 1.52* -- 1.48 1.49  HEPARINUNFRC -- -- -- --  CREATININE 0.87 0.83 -- --  CKTOTAL -- -- -- --  CKMB -- -- -- --  TROPONINI -- -- -- --    Estimated Creatinine Clearance: 54.5 ml/min (by C-G formula based on Cr of 0.87).   Assessment: 56yo female with AFib on Coumadin, s/p AVN ablation and pacemaker insertion on 11/21. INR is subtherapeutic at 1.52.   Dose was missed on 11/20. Off amiodarone, CBC stable, no bleeding per chart. Home dose = 5mg  daily.   Goal of Therapy:  INR 2-3 Monitor platelets by anticoagulation protocol: Yes   Plan:  - Coumadin 7.5mg  po x 1 - f/u daily INR Herby Abraham, Pharm.D. [phone] 06/26/2012 10:34 AM

## 2012-06-26 NOTE — Progress Notes (Signed)
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  CRT-P implanted 06-21-2012.  Pt continues to be mildly unstable with gait with risk of falls continuing, but no external assist needed for gait today. Increased DOE with walking and exercises, but HR and O2 sats both stayed in the mid 90s on RA.  Pt is borderline appropriate for SNF placement for rehab.  She may want to consider HHPT especially if her goal is to be home for Thanksgiving.      Follow Up Recommendations  Home health PT;Supervision - Intermittent;Other (comment) (vs SNF, she is borderline appropriate.)     Does the patient have the potential to tolerate intense rehabilitation   NA  Barriers to Discharge  none      Equipment Recommendations  None recommended by PT    Recommendations for Other Services  none  Frequency Min 3X/week   Plan Discharge plan needs to be updated;Frequency remains appropriate    Precautions / Restrictions Precautions Precautions: Fall;ICD/Pacemaker Restrictions Weight Bearing Restrictions: No   Pertinent Vitals/Pain VSS during tx with both O2 sats and HR in the mid 90s during gait.  DOE 2/4 with exertion.      Mobility  Transfers Sit to Stand: 5: Supervision;With armrests;From chair/3-in-1 Stand to Sit: 5: Supervision;With armrests;To chair/3-in-1 Details for Transfer Assistance: supervision for safety due to fall risk Ambulation/Gait Ambulation/Gait Assistance: 5: Supervision Ambulation Distance (Feet): 300 Feet Assistive device: None Ambulation/Gait Assistance Details: close supervision due to mildly staggering gait pattern, but no external assist needed for small LOBs.   Gait Pattern:  (staggering) Gait velocity: 1.88 ft/sec which makes her a neighborhood ambulator,  but not a full community ambulator.      Exercises General Exercises - Upper Extremity Shoulder Flexion: AROM;Right;10 reps;Seated (right only due to pacemaker precautions on the left) Elbow Flexion: AROM;Both;10 reps;Seated General Exercises - Lower Extremity Long Arc Quad: AROM;Both;5 reps;Seated (5 second holds) Hip ABduction/ADduction: AROM;Both;Seated;10 reps (adduction only against pillow for resistance) Hip Flexion/Marching: AROM;Both;10 reps;Seated Toe Raises: AROM;Both;20 reps;Seated Heel Raises: AROM;20 reps;Both;Seated Other Exercises Other Exercises: semi tandem and tandem stands multiple trials bil legs assist needed to obtain the position and not able to stand for >20 seconds once in position for true tandem stand.       PT Goals Acute Rehab PT Goals PT Goal: Sit to Stand - Progress: Progressing toward goal PT Goal: Stand to Sit - Progress: Progressing toward goal PT Goal: Ambulate - Progress: Progressing toward goal PT Goal: Perform Home Exercise Program - Progress: Progressing toward goal  Visit Information  Last PT Received On: 06/26/12 Assistance Needed: +1    Subjective Data  Subjective: Pt reports she is feeling stronger today Patient Stated Goal: to go home before Thanksgiving.     Cognition  Overall Cognitive Status: Appears within functional limits for tasks assessed/performed       End of Session PT - End of Session Activity Tolerance: Patient limited by fatigue Patient left: in chair;with call bell/phone within reach        [place] B. Aasiya Creasey, PT, DPT [phone]   06/26/2012, 9:30 AM

## 2012-06-26 NOTE — Progress Notes (Signed)
ELECTROPHYSIOLOGY ROUNDING NOTE    Patient Name: [name] [name] Date of Encounter: 06-23-2012    SUBJECTIVE:Patient feels better  Shortness of breath improved.  No chest pain. 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  CRT-P implanted 06-21-2012, Ambulating a little, waiting for SNF placement  TELEMETRY: Reviewed telemetry pt in atrial fibrillation with ventricular pacing at 90bpm, PVC's Filed Vitals:   06/25/12 0446 06/25/12 1300 06/25/12 2047 06/26/12 0451  BP: 113/65 96/52 115/58 111/65  Pulse: 90 83 85 89  Temp: 98.4 F (36.9 C) 97.5 F (36.4 C) 97.8 F (36.6 C) 98.1 F (36.7 C)  TempSrc: Oral Oral Oral Oral  Resp: 18 20 20 20   Height:      Weight: 106 lb 4.8 oz (48.217 kg)   106 lb 11.2 oz (48.4 kg)  SpO2: 96% 98% 98% 99%    Intake/Output Summary (Last 24 hours) at 06/26/12 2130 Last data filed at 06/26/12 0224  Gross per 24 hour  Intake    660 ml  Output    851 ml  Net   -191 ml    PHYSICAL EXAM Left chest with dressing intact.  No hematoma.  Well developed and nourished in mild  acute distress HENT normal Neck supple with JVP 7-8 Carotids brisk and full without bruits Device pocket well healed; without hematoma or erythema  clear Regular rate and rhythm, no murmurs or gallops Abd-soft with active BS without hepatomegaly No Clubbing cyanosis  no edema Skin-warm and dry A & Oriented  Grossly normal sensory and motor function   LABS: Basic Metabolic Panel:  Basename 06/26/12 0500 06/25/12 1214  NA 135 135  K 4.2 3.7  CL 95* 96  CO2 29 27  GLUCOSE 94 105*  BUN 23 21  CREATININE 0.87 0.83  CALCIUM 9.5 9.2  MG -- --  PHOS -- --   Liver Function Tests: No results found for this basename: AST:2,ALT:2,ALKPHOS:2,BILITOT:2,PROT:2,ALBUMIN:2 in the last 72 hours CBC:  Basename 06/26/12 0500 06/25/12 0730  WBC 6.1 5.6  NEUTROABS -- --  HGB 11.4* 11.4*  HCT 33.2* 33.6*  MCV 96.2 94.6  PLT 308 289   Fasting Lipid Panel: No results found for this basename:  CHOL,HDL,LDLCALC,TRIG,CHOLHDL,LDLDIRECT in the last 72 hours INR: 1.48  Radiology/Studies:  Dg Chest 2 View 06/22/2012  *RADIOLOGY REPORT*  Clinical Data: Post pacemaker insertion, chest soreness  CHEST - 2 VIEW  Comparison: Portable chest x-ray of 06/14/2012  Findings: The endotracheal tube and NG tube have been removed.  No pneumothorax is seen.  Permanent pacemaker is now present. Cardiomegaly is stable and there may be minimal pulmonary vascular congestion present.  IMPRESSION:  1.  Permanent pacemaker now present.  No pneumothorax. 2.  Cardiomegaly.  Question mild pulmonary vascular congestion. 3.  Endotracheal tube removed.   Original Report Authenticated By: Dwyane Dee, M.D.     DEVICE INTERROGATION: Device interrogated by industry 06-22-2012.  Lead values including impedence, sensing, threshold within normal values for RA and RV.  No capture LV lead.      Patient Active Hospital Problem List:  Afib with RVR   Nonischemic cardiomyopathy (06/12/2012)  * Chronic systolic CHF (congestive heart failure) (06/12/2012)  CHB s/p AV ablation    Pt much improved post AV ablation;  Will continue diuresis and adjust meds  May be candidate for aldactone but hx of hyperkalemia  BMET stable   Decrease Lasix bid.>>qd D/c SNF   Will need dig level within 10 days Will need pacer clinic appt in 10days for REPROGRAMMING

## 2012-06-26 NOTE — Progress Notes (Signed)
I cosign for Jessica Haraway RN assessment, med administration, I&O's, and notes.  

## 2012-06-27 LAB — PROTIME-INR
INR: 1.48 (ref 0.00–1.49)
Prothrombin Time: 17.5 seconds — ABNORMAL HIGH (ref 11.6–15.2)

## 2012-06-27 LAB — BASIC METABOLIC PANEL
CO2: 29 mEq/L (ref 19–32)
Calcium: 9.5 mg/dL (ref 8.4–10.5)
GFR calc non Af Amer: 79 mL/min — ABNORMAL LOW (ref >90)
Potassium: 3.9 mEq/L (ref 3.5–5.1)
Sodium: 136 mEq/L (ref 135–145)

## 2012-06-27 LAB — CBC
MCH: 31.9 pg (ref 26.0–34.0)
MCHC: 33.5 g/dL (ref 30.0–36.0)
Platelets: 331 10*3/uL (ref 150–400)
RBC: 3.57 MIL/uL — ABNORMAL LOW (ref 3.87–5.11)

## 2012-06-27 MED ORDER — RIVAROXABAN 20 MG PO TABS: 20.0000 mg | ORAL_TABLET | Freq: Every day | ORAL | Status: DC

## 2012-06-27 MED ORDER — MAGNESIUM OXIDE 400 (241.3 MG) MG PO TABS: 400.0000 mg | ORAL_TABLET | Freq: Every day | ORAL | Status: DC

## 2012-06-27 MED ORDER — DIGOXIN 125 MCG PO TABS: 0.1250 mg | ORAL_TABLET | Freq: Every day | ORAL | Status: DC

## 2012-06-27 MED ORDER — FUROSEMIDE 40 MG PO TABS: 40.0000 mg | ORAL_TABLET | Freq: Every day | ORAL | Status: DC

## 2012-06-27 MED ORDER — METOPROLOL SUCCINATE ER 100 MG PO TB24: 100.0000 mg | ORAL_TABLET | Freq: Every day | ORAL | Status: DC

## 2012-06-27 MED ORDER — LISINOPRIL 10 MG PO TABS: 10.0000 mg | ORAL_TABLET | Freq: Two times a day (BID) | ORAL | Status: DC

## 2012-06-27 MED ORDER — RIVAROXABAN 20 MG PO TABS
20.0000 mg | ORAL_TABLET | Freq: Every day | ORAL | Status: DC
  Administered 2012-06-27 – 2012-06-28 (×2): 20 mg via ORAL
  Filled 2012-06-27 (×2): qty 1

## 2012-06-27 MED ORDER — ENSURE COMPLETE PO LIQD: 237.0000 mL | Freq: Two times a day (BID) | ORAL | Status: DC

## 2012-06-27 MED ORDER — CEPHALEXIN 250 MG PO CAPS: 250.0000 mg | ORAL_CAPSULE | Freq: Three times a day (TID) | ORAL | Status: DC

## 2012-06-27 NOTE — Progress Notes (Signed)
   ELECTROPHYSIOLOGY ROUNDING NOTE    Patient Name: [name] [name] Date of Encounter: 06-27-2012    SUBJECTIVE:Patient feels well.  Shortness of breath much improved from last week.  No chest pain.  Pt s/p CRT-P implant 06-21-2012 with no good options for LV lead.  LV lead left in place but currently turned off and pt is RV only pacing.  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    TELEMETRY: Reviewed telemetry pt in atrial fib with V pacing at 90, occasional PVC's Filed Vitals:   06/26/12 1310 06/26/12 1433 06/26/12 2142 06/27/12 0526  BP: 119/76 94/62 113/69 110/67  Pulse: 90 89 90 88  Temp: 97.7 F (36.5 C)  98.3 F (36.8 C) 99.2 F (37.3 C)  TempSrc: Oral  Oral Oral  Resp: 19  19 18   Height:      Weight:    108 lb (48.988 kg)  SpO2: 100%  97% 99%    Intake/Output Summary (Last 24 hours) at 06/27/12 0737 Last data filed at 06/27/12 2956  Gross per 24 hour  Intake    966 ml  Output   1125 ml  Net   -159 ml    LABS: Basic Metabolic Panel:  Basename 06/27/12 0525 06/26/12 0500  NA 136 135  K 3.9 4.2  CL 98 95*  CO2 29 29  GLUCOSE 93 94  BUN 25* 23  CREATININE 0.81 0.87  CALCIUM 9.5 9.5  MG -- --  PHOS -- --   CBC:  Basename 06/27/12 0525 06/26/12 0500  WBC 5.7 6.1  NEUTROABS -- --  HGB 11.4* 11.4*  HCT 34.0* 33.2*  MCV 95.2 96.2  PLT 331 308   INR: 1.48-- pharmacy dosing Warfarin  Radiology/Studies:  Dg Chest 2 View 06/22/2012  *RADIOLOGY REPORT*  Clinical Data: Post pacemaker insertion, chest soreness  CHEST - 2 VIEW  Comparison: Portable chest x-ray of 06/14/2012  Findings: The endotracheal tube and NG tube have been removed.  No pneumothorax is seen.  Permanent pacemaker is now present. Cardiomegaly is stable and there may be minimal pulmonary vascular congestion present.  IMPRESSION:  1.  Permanent pacemaker now present.  No pneumothorax. 2.  Cardiomegaly.  Question mild pulmonary vascular congestion. 3.   Endotracheal tube removed.   Original Report Authenticated By: Dwyane Dee, M.D.    PHYSICAL EXAM Left chest without hematoma or ecchymosis Well developed and nourished in no acute distress HENT normal Neck supple with JVP-flat Carotids brisk and full without bruits Clear Regular rate and rhythm, no murmurs or gallops Abd-soft with active BS without hepatomegaly No Clubbing cyanosis edema Skin-warm and dry A & Oriented  Grossly normal sensory and motor function   Appt made with Gunnar Fusi in our [place] office for Monday, December 2nd at 11:30AM to do wound check and turn rate down to 80bpm.    Patient Active Hospital Problem List: Atrial fibrillation ()   Nonischemic cardiomyopathy (06/12/2012)   Chronic systolic CHF (congestive heart failure)    Pacemaker-[employer] (06/22/2012)   Complete heart block--s/p AV ablation (06/23/2012)    She is much improved and will look at discharge  Per pt either to rehab or to home  Will await decsion today With INR struggling will start NOAC

## 2012-06-27 NOTE — Progress Notes (Signed)
CSW spoke with patient. PT is recommending HH. Patient can not privately pay for SNF. Patient reported that she is going to live with one of her sisters for a while. CSW will contact CM to set up [facility]. Clinical Social Worker will sign off for now as social work intervention is no longer needed. Please consult [place] again if new need arises.   Sabino Niemann, MSW, [employer] [phone]

## 2012-06-27 NOTE — Discharge Summary (Signed)
CARDIOLOGY DISCHARGE SUMMARY   Patient ID: [name] [name] MRN: [id number] DOB/AGE: 57-Oct-1957 57 y.o.  Admit date: 06/12/2012 Discharge date: 06/28/2012  Primary Discharge Diagnosis:    *Atrial fibrillation RVR  Secondary Discharge Diagnosis:  Cardiac arrest, likely due to hyperkalemia with bradycardia and ventricular tachycardia VDRF  Nonischemic cardiomyopathy  Acute on Chronic systolic CHF (congestive heart failure)  Pacemaker-[employer]  Complete heart block--s/p AV ablation COPD Chronic anticoagulation - currently Xarelto Elevated digoxin level at 1.8  Consults: Critical care medicine, electrophysiology  Procedures: Endotracheal intubation, central venous catheter, mechanical ventilation, transesophageal echocardiogram, direct current cardioversion x2, permanent pacemaker insertion, AV node ablation, Left Heart Cath, Selective Coronary Angiography, LV angiography, BAL  Hospital Course:  [name] is a 56 year old female with a history of atrial fibrillation on Coumadin. She developed asystole and respiratory failure requiring CPR and intubation. She was also severely hyperkalemic with a potassium of 7.7. There was also concern for sepsis and she required IV fluid resuscitation as well as dopamine for hypotension. She was placed on antibiotics and sedated. She was treated for the hyperkalemia. She was in atrial fibrillation with rapid ventricular response.  1. Hyperkalemia: She was treated with dextrose, insulin, calcium chloride and her condition improved. Her potassium was followed closely during her hospital stay but she had no further problems with hyperkalemia. She developed hypokalemia and hypomagnesemia, these were supplemented and resolved.  2.VDRF: Critical care medicine managed her ventilator and respiratory status. She was responsive after the arrest and therefore hypothermia was not indicated. She was on pressors and antibiotics at first. She was started on tube feeds  and felt to be malnourished. Once the tube feeds were completed she was started on Ensure twice a day as a snack. There was concern for aspiration as well as health care acquired pneumonia and she had left lower lobe collapse on one of her chest x-rays. She was followed closely and by 06/14/2012, she was stable for extubation. Once she was stable for transfer to telemetry, critical care medicine no longer followed her. Her antibiotics were tapered and discontinued. Cultures of blood and urine were performed but were negative.  3. nonischemic cardiomyopathy: A 2-D echocardiogram was performed which showed left ventricular dysfunction. Because of her cardiac arrest and left ventricular dysfunction, she was taken to the cath lab when she stabilized. On 06/15/2012, she had a cardiac catheterization with results described below. There was no critical coronary artery disease but her EF appeared decreased. This is a nonischemic cardiomyopathy. She is currently tolerating an ACE inhibitor and a beta blocker. She will also be on a diuretic at discharge.  4. atrial fibrillation with rapid ventricular response: [name] was in atrial fibrillation on admission with a rapid rate. She also had an elevated digoxin level of 1.8 and acute renal insufficiency. The digoxin was discontinued. As her condition improved, her rate was still not controlled. Multiple medications were tried and she was loaded with amiodarone. The TEE was performed with the results below. Cardioversion was also performed but was unsuccessful. The amiodarone load was continued for several days and then she was cardioverted again on 06/21/2012. This was also unsuccessful. Her heart rate remained greater than 130 despite maximal medical therapy. An EP consult was called. She was seen by Dr. Ladona Ridgel who recommended biventricular pacemaker insertion followed by AV node ablation. The pacemaker was inserted on 06/21/2012 and she had an AV node ablation on  06/22/2012. The left ventricular lead was not functioning normally but it was felt this  could not be repositioned. She is currently being right ventricular paced at 90 and will have the rate gradually turned down as an outpatient. Post-procedure chest x-ray showed no pneumothorax. When she received her pacemaker, she was started back on a beta blocker with metoprolol instead of atenolol. She was also restarted on her to [place] at a lower dose since her renal function had normalized.  5. Anticoagulation: [name] had been on Coumadin prior to admission. Her INR was therapeutic and the Coumadin was held initially to allow for treatment of procedures. Once her INR was subtherapeutic, she was started on heparin. The heparin was continued until her Coumadin could be resumed. Her Coumadin was been resumed but her INR remained subtherapeutic. Dr. Graciela Husbands reviewed the situation and felt that she would do well with a novel oral anticoagulant. She was started on rivaroxaban and tolerated this well.  6. Acute systolic CHF: Her volume status was followed closely and she needed repeated doses of IV Lasix. She lost 6 pounds during her hospital stay. Her discharge weight was 108 pounds. She is to weigh herself daily at discharge and will have an early followup in the office as well.  7. Deconditioning: [name] was followed by physical therapy and occupational therapy. She was felt to have significant problems with deconditioning. There was also concern for the patient being able to manage her medications and diet. Initially, the plan was for skilled nursing facility placement. However, her physical condition improved such that it was felt the best plan was for increased assistance at home. To that end, she will initially be discharged home to stay with her sister. She will have home health PT and OT. She will also have a home health nurse and an aide. Hopefully her condition will improve.   8. Acute renal  insufficiency/hyponatremia: Ms. Link had an elevated BUN and creatinine on admission, 50/1.50. She also had a sodium level that was slightly low at 132. She was hydrated and her electrolytes improved.  ADDENDUM 06/28/12 to original discharge summary by Theodore Demark - the patient stayed overnight due to ride issues (staying with her sister). Her BP was noted to be on the low side thus BP medications were adjusted - Toprol, Lasix, and Lisinopril. I also called [facility] with this updated e-scribe change. Her med rec below has been refreshed to reflect these changes. Dayna Dunn PA-C  Labs:   Lab Results  Component Value Date   WBC 5.1 06/28/2012   HGB 10.7* 06/28/2012   HCT 31.6* 06/28/2012   MCV 96.0 06/28/2012   PLT 309 06/28/2012     Lab 06/28/12 0515  NA 136  K 4.0  CL 98  CO2 30  BUN 26*  CREATININE 0.93  CALCIUM 9.4  PROT --  BILITOT --  ALKPHOS --  ALT --  AST --  GLUCOSE 93   Lab Results  Component Value Date   TROPONINI <0.30 06/12/2012    Lipid Panel     Component Value Date/Time   CHOL 105 06/13/2012 0848   TRIG 114 06/21/2012 0525   HDL 41 06/13/2012 0848   CHOLHDL 2.6 06/13/2012 0848   VLDL 18 06/13/2012 0848   LDLCALC 46 06/13/2012 0848    Pro B Natriuretic peptide (BNP)  Date/Time Value Range Status  06/12/2012  9:10 PM 4270.0* 0 - 125 pg/mL Final  06/12/2012  2:26 AM 3356.0* 0 - 125 pg/mL Final    Basename 06/27/12 0525  INR 1.48      Radiology:  Dg Chest 2 View 06/22/2012  *RADIOLOGY REPORT*  Clinical Data: Post pacemaker insertion, chest soreness  CHEST - 2 VIEW  Comparison: Portable chest x-ray of 06/14/2012  Findings: The endotracheal tube and NG tube have been removed.  No pneumothorax is seen.  Permanent pacemaker is now present. Cardiomegaly is stable and there may be minimal pulmonary vascular congestion present.  IMPRESSION:  1.  Permanent pacemaker now present.  No pneumothorax. 2.  Cardiomegaly.  Question mild pulmonary vascular  congestion. 3.  Endotracheal tube removed.   Original Report Authenticated By: Dwyane Dee, M.D.    Dg Chest Port 1 View 06/14/2012  *RADIOLOGY REPORT*  Clinical Data: Ventilator, shortness of breath.  PORTABLE CHEST - 1 VIEW  Comparison: 06/13/2012  Findings: Support devices including endotracheal tube are unchanged.  There is left base atelectasis or infiltrate, stable. Right lung is clear.  Heart is mildly enlarged.  No visible effusions.  No acute bony abnormality.  IMPRESSION: Stable left base atelectasis or infiltrate.  Mild cardiomegaly.   Original Report Authenticated By: Charlett Nose, M.D.    Dg Chest Port 1 View 06/13/2012  *RADIOLOGY REPORT*  Clinical Data: Ventilator.  PORTABLE CHEST - 1 VIEW  Comparison: 06/12/2012  Findings: Endotracheal tube and NG tube remain in place, unchanged. Left base atelectasis or infiltrate, improved since prior study. Right lung is clear.  Heart is borderline in size.  No acute bony abnormality.  IMPRESSION: Decreasing left base atelectasis or infiltrate.   Original Report Authenticated By: Charlett Nose, M.D.    Portable Chest Xray To Verify Ett/ Central Line Placement 06/12/2012  *RADIOLOGY REPORT*  Clinical Data: ETT placement.  PORTABLE CHEST - 1 VIEW  Comparison: Chest x-ray 06/12/2012.  Findings: An endotracheal tube is in place with tip 4.3 cm above the carina. A nasogastric tube is seen extending into the stomach, however, the tip of the nasogastric tube extends below the lower margin of the image.  Compared to the prior examination there is now complete opacification of the base of the left hemithorax and slight shift of structures toward the left hemithorax, suggestive of complete left lower lobe atelectasis.  Lungs are otherwise clear.  No definite pleural effusions.  No evidence of pulmonary edema.  Mild cardiomegaly is unchanged.  Atherosclerosis of the thoracic aorta.  IMPRESSION: 1.  Support apparatus, as above. 2.  Interval development of complete left  lower lobe atelectasis. Correlation for signs and symptoms of aspiration or mucous plugging is recommended.  These results were called by telephone on 06/12/2012 at 10:40 a.m. to Dr. Molli Knock, who verbally acknowledged these results.   Original Report Authenticated By: Trudie Reed, M.D.    Dg Chest Port 1 View 06/12/2012  *RADIOLOGY REPORT*  Clinical Data: Nausea, vomiting.  Post intubation.  PORTABLE CHEST - 1 VIEW  Comparison: 10/28/2004  Findings: Endotracheal tube is 4 cm above the carina.  There is cardiomegaly.  No confluent airspace opacities or effusions.  No acute bony abnormality.  IMPRESSION: Endotracheal tube 4 cm above the carina.  Cardiomegaly.   Original Report Authenticated By: Charlett Nose, M.D.    Dg Abd Portable 1v 06/12/2012  *RADIOLOGY REPORT*  Clinical Data: Abdominal pain  PORTABLE ABDOMEN - 1 VIEW  Comparison: None.  Findings: NG tube tip projects over the antrum of the stomach.  No disproportionate dilatation of small bowel.  Right femoral vascular catheter is in place.  No obvious free intraperitoneal gas.  IMPRESSION: Nonobstructive bowel gas pattern.  NG tube.   Original Report Authenticated By: Merton Border  Hoss, M.D.     Cardiac Cath: 06/15/2012 Left mainstem: Short without significant disease  Left anterior descending (LAD): There is calcification proximally. There are two major diagonal branches. Between the first and second diagonal branches are approximately 30-40% plaquing. The distal LAD has mild irregularity. Both diagonals have minimal irregularity.  Left circumflex (LCx): Provides one large bifurcation marginal branch and a small AV circumflex. There is minor distal irregularity. The AV circ is small  Right coronary artery (RCA): Provides a large PDA and a large and smaller PLA branches. No significant focal obstruction.  Left ventriculography: Due to ectopy, EF cannot be calculated. With AF, difficult to be sure of function but appears 35%. There does appear to be  some MR  EKG: Atrial fibrillation with rapid ventricular response, right bundle branch block  Echo: 06/19/2012 Study Conclusions - Left ventricle: Systolic function was moderately to severely reduced. The estimated ejection fraction was in the range of 30% to 35%. - Aortic valve: Mild regurgitation. - Mitral valve: Moderate to severe regurgitation. - Left atrium: The atrium was dilated. No evidence of thrombus in the atrial cavity or appendage. - Right atrium: The atrium was dilated. - Atrial septum: The septum bowed from left to right, consistent with increased left atrial pressure. No defect or patent foramen ovale was identified.    FOLLOW UP PLANS AND APPOINTMENTS No Known Allergies   Medication List     As of 06/28/2012  5:52 PM    STOP taking these medications         aspirin 325 MG tablet      atenolol 50 MG tablet   Commonly known as: TENORMIN      diltiazem 180 MG 24 hr capsule   Commonly known as: CARDIZEM CD      potassium chloride SA 20 MEQ tablet   Commonly known as: K-DUR,KLOR-CON      warfarin 5 MG tablet   Commonly known as: COUMADIN      TAKE these medications         ALPRAZolam 0.25 MG tablet   Commonly known as: XANAX   Take 0.5 mg by mouth at bedtime as needed. For anxiety      cephALEXin 250 MG capsule   Commonly known as: KEFLEX   Take 1 capsule (250 mg total) by mouth 3 (three) times daily. Take for 3 more days      digoxin 0.125 MG tablet   Commonly known as: LANOXIN   Take 1 tablet (0.125 mg total) by mouth daily.      feeding supplement Liqd   Take 237 mLs by mouth 2 (two) times daily between meals.      furosemide 20 MG tablet   Commonly known as: LASIX   Take 1 tablet (20 mg total) by mouth daily.      insulin glargine 100 UNIT/ML injection   Commonly known as: LANTUS   Inject 5 Units into the skin at bedtime.      lisinopril 5 MG tablet   Commonly known as: PRINIVIL,ZESTRIL   Take 1 tablet (5 mg total) by mouth daily.       magnesium oxide 400 (241.3 MG) MG tablet   Commonly known as: MAG-OX   Take 1 tablet (400 mg total) by mouth daily.      metoprolol succinate 50 MG 24 hr tablet   Commonly known as: TOPROL-XL   Take 1 tablet (50 mg total) by mouth daily. Take with or immediately following a meal.  pantoprazole 40 MG tablet   Commonly known as: PROTONIX   Take 40 mg by mouth daily.      Rivaroxaban 20 MG Tabs   Commonly known as: XARELTO   Take 1 tablet (20 mg total) by mouth daily with supper.      sertraline 50 MG tablet   Commonly known as: ZOLOFT   Take 50 mg by mouth daily.      SPIRIVA HANDIHALER 18 MCG inhalation capsule   Generic drug: tiotropium   Place 18 mcg into inhaler and inhale daily.      zolpidem 5 MG tablet   Commonly known as: AMBIEN   Take 5 mg by mouth at bedtime as needed. For sleep          Discharge Orders    Future Appointments: Provider: Department: Dept Phone: Center:   07/03/2012 11:30 AM Marinus Maw, MD [facility] [place] [phone] [id number]   07/10/2012 1:40 PM Jodelle Gross, NP [facility] [place] [phone] [id number]     Follow-up Information    Follow up with Joni Reining, NP. On 07/10/2012. (1:40 pm)    Contact information:   [address] [place] [address] [phone]       Follow up with Forestine Chute. On 07/03/2012. (Wound and Device check at 11:30 am)    Contact information:   See at [facility] [address] [place], [place] [phone]  -          BRING ALL MEDICATIONS WITH YOU TO FOLLOW UP APPOINTMENTS  Time spent with patient to include physician time: 49 min Signed: Theodore Demark 06/27/2012, 5:51 PM Co-Sign MD

## 2012-06-27 NOTE — Progress Notes (Signed)
Patient was discharged at 6:00pm, however, reportedly can't find a ride home. She is ok to be discharged as soon as she has transportation.  [name] [name] 06/27/2012 6:39 PM

## 2012-06-27 NOTE — Progress Notes (Signed)
Physical Therapy Treatment Patient Details Name: [name] [name] MRN: [id number] DOB: 08/11/1955 Today's Date: 06/27/2012 Time: 4098-1191 PT Time Calculation (min): 24 min  PT Assessment / Plan / Recommendation Comments on Treatment Session  Pt s/p AVN ablation and pacer 11/20. Pt demonstrates functional mobility without assist today and that her only concern is bathing with LUE restrictions from pacer but other mobility at baseline. No dyspnea today with sats 98% on RA and HR 89 throughout mobility. Discussed with pt and sister that HHPT and increased assist at home is a more reasonable plan. Pt and sister both state that their biggest concern is pt ability to manage her meds and diet rather than functional mobility.     Follow Up Recommendations  Home health PT     Does the patient have the potential to tolerate intense rehabilitation     Barriers to Discharge        Equipment Recommendations  None recommended by PT    Recommendations for Other Services    Frequency     Plan Discharge plan needs to be updated;Frequency remains appropriate    Precautions / Restrictions Precautions Precautions: ICD/Pacemaker Restrictions Weight Bearing Restrictions: No   Pertinent Vitals/Pain No pain Vitals stable    Mobility  Bed Mobility Bed Mobility: Supine to Sit;Sit to Supine Supine to Sit: 7: Independent Sitting - Scoot to Edge of Bed: 7: Independent Sit to Supine: 7: Independent Transfers Sit to Stand: 6: Modified independent (Device/Increase time);From chair/3-in-1;From bed Stand to Sit: 6: Modified independent (Device/Increase time);To bed;To chair/3-in-1 Ambulation/Gait Ambulation/Gait Assistance: 6: Modified independent (Device/Increase time) Ambulation Distance (Feet): 800 Feet Assistive device: None Ambulation/Gait Assistance Details: no deviations in gait pattern today just slightly decreased speed Gait Pattern: Within Functional Limits Gait velocity: 37ft/16sec= 1.875 as  a neighborhood ambulator but pt also states this is her normal gait speed Stairs: Yes Stairs Assistance: 6: Modified independent (Device/Increase time) Stair Management Technique: One rail Left Number of Stairs: 6     Exercises General Exercises - Lower Extremity Long Arc Quad: AROM;Both;20 reps;Seated Hip Flexion/Marching: AROM;Both;20 reps;Seated   PT Diagnosis:    PT Problem List:   PT Treatment Interventions:     PT Goals Acute Rehab PT Goals PT Goal: Sit to Stand - Progress: Progressing toward goal PT Goal: Stand to Sit - Progress: Progressing toward goal PT Transfer Goal: Bed to Chair/Chair to Bed - Progress: Progressing toward goal PT Goal: Ambulate - Progress: Progressing toward goal PT Goal: Up/Down Stairs - Progress: Met PT Goal: Perform Home Exercise Program - Progress: Met  Visit Information  Last PT Received On: 06/27/12 Assistance Needed: +1    Subjective Data  Subjective: I"m just concerned about [place] depression [place]   Cognition  Overall Cognitive Status: Appears within functional limits for tasks assessed/performed Arousal/Alertness: Awake/alert Orientation Level: Appears intact for tasks assessed Behavior During Session: [facility] for tasks performed    Balance     End of Session PT - End of Session Activity Tolerance: Patient tolerated treatment well Patient left: in chair;with call bell/phone within reach;with family/visitor present   GP     Toney Sang Beth 06/27/2012, 11:03 AM Delaney Meigs, PT [phone]

## 2012-06-28 LAB — CBC
Platelets: 309 10*3/uL (ref 150–400)
RBC: 3.29 MIL/uL — ABNORMAL LOW (ref 3.87–5.11)
WBC: 5.1 10*3/uL (ref 4.0–10.5)

## 2012-06-28 LAB — BASIC METABOLIC PANEL
CO2: 30 mEq/L (ref 19–32)
Chloride: 98 mEq/L (ref 96–112)
Sodium: 136 mEq/L (ref 135–145)

## 2012-06-28 MED ORDER — LISINOPRIL 5 MG PO TABS: 5.0000 mg | ORAL_TABLET | Freq: Every day | ORAL | Status: DC

## 2012-06-28 MED ORDER — METOPROLOL SUCCINATE ER 50 MG PO TB24: 50.0000 mg | ORAL_TABLET | Freq: Every day | ORAL | Status: DC

## 2012-06-28 MED ORDER — CEPHALEXIN 250 MG PO CAPS
250.0000 mg | ORAL_CAPSULE | Freq: Three times a day (TID) | ORAL | Status: DC
Start: 2012-06-28 — End: 2012-06-28

## 2012-06-28 MED ORDER — FUROSEMIDE 20 MG PO TABS: 20.0000 mg | ORAL_TABLET | Freq: Every day | ORAL | Status: DC

## 2012-06-28 MED ORDER — CEPHALEXIN 250 MG PO CAPS: 250.0000 mg | ORAL_CAPSULE | Freq: Three times a day (TID) | ORAL | Status: DC

## 2012-06-28 NOTE — Progress Notes (Addendum)
Physical Therapy Treatment Patient Details Name: [name] [name] MRN: [id number] DOB: July 16, 1956 Today's Date: 06/28/2012 Time: 1308-6578 PT Time Calculation (min): 13 min  PT Assessment / Plan / Recommendation Comments on Treatment Session  Session focus on education for safety at home with mobility. Pt demonstrating modified independence at this time with gait and transfers. Able to step over simulated tub but reports she will probably do seated sponge baths anyway. Would she benefit from [facility] to assist with med management? Likely d/c home today.     Follow Up Recommendations  Home health PT     Does the patient have the potential to tolerate intense rehabilitation     Barriers to Discharge        Equipment Recommendations  None recommended by PT    Recommendations for Other Services    Frequency Min 3X/week   Plan Discharge plan remains appropriate;Frequency remains appropriate    Precautions / Restrictions Precautions Precautions: ICD/Pacemaker Restrictions Weight Bearing Restrictions: No       Mobility  Bed Mobility Bed Mobility: Not assessed Transfers Transfers: Sit to Stand;Stand to Sit Sit to Stand: 6: Modified independent (Device/Increase time) Stand to Sit: 6: Modified independent (Device/Increase time) Ambulation/Gait Ambulation/Gait Assistance: 6: Modified independent (Device/Increase time) Ambulation Distance (Feet): 600 Feet Assistive device: None Ambulation/Gait Assistance Details: again no deviations noted, decreased speed but pt able to increase speed slightly when asked (not a significant change and she doesn't maintain it) Gait Pattern: Within Functional Limits    Exercises     PT Diagnosis:    PT Problem List:   PT Treatment Interventions:     PT Goals Acute Rehab PT Goals PT Goal: Sit to Stand - Progress: Progressing toward goal PT Goal: Stand to Sit - Progress: Progressing toward goal PT Goal: Ambulate - Progress: Progressing toward  goal  Visit Information  Last PT Received On: 06/28/12 Assistance Needed: +1    Subjective Data  Subjective: Im waiting to hear back from my family so that they can bring me some shoes.    Cognition  Overall Cognitive Status: Appears within functional limits for tasks assessed/performed Arousal/Alertness: Awake/alert Orientation Level: Appears intact for tasks assessed Behavior During Session: [facility] for tasks performed    Balance  Dynamic Standing Balance Dynamic Standing - Balance Support: Bilateral upper extremity supported Dynamic Standing - Level of Assistance: 5: Stand by assistance Dynamic Standing - Comments: with demonstration and verbal instruction pt able to brace herself against the wall and take a big step over trashcan to simulate getting into the bathtub with supervision  End of Session PT - End of Session Equipment Utilized During Treatment: Gait belt Activity Tolerance: Patient tolerated treatment well Patient left: in chair;with call bell/phone within reach Nurse Communication: Mobility status   GP     [facility] HELEN 06/28/2012, 11:34 AM

## 2012-06-28 NOTE — Progress Notes (Signed)
Patient's IV and telemetry has been discontinued, patient verbalizes understanding of discharge instruction. Lorretta Harp RN

## 2012-06-28 NOTE — Progress Notes (Signed)
Patient's BP is 87/62 automatic and 82/60 manually, BP meds held this am, PA notified and confirmed to continue to hold medications; will continue to monitor patient.  Lorretta Harp RN

## 2012-06-28 NOTE — Progress Notes (Signed)
ELECTROPHYSIOLOGY ROUNDING NOTE    Patient Name: CESIA ORF Date of Encounter: 06-27-2012    SUBJECTIVE:Patient feels well.  Shortness of breath much improved from last week.  No chest pain.  Pt s/p CRT-P implant 06-21-2012 with no good options for LV lead.  LV lead left in place but currently turned off and pt is RV only pacing.  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    Walking with out symptoms  BP lwo this am and meds held  TELEMETRY: Reviewed telemetry pt in atrial fib with V pacing at 90, occasional PVC's Filed Vitals:   06/27/12 2043 06/28/12 0547 06/28/12 1000 06/28/12 1005  BP: 102/56 130/57 87/62 82/60   Pulse: 84 56 90   Temp: 98.2 F (36.8 C) 98.2 F (36.8 C) 97.3 F (36.3 C)   TempSrc: Oral Oral Oral   Resp: 18 18 18    Height:      Weight:  107 lb 12.9 oz (48.9 kg)    SpO2: 95% 97% 98%     Intake/Output Summary (Last 24 hours) at 06/28/12 1228 Last data filed at 06/28/12 1006  Gross per 24 hour  Intake   1426 ml  Output    800 ml  Net    626 ml    LABS: Basic Metabolic Panel:  Basename 06/28/12 0515 06/27/12 0525  NA 136 136  K 4.0 3.9  CL 98 98  CO2 30 29  GLUCOSE 93 93  BUN 26* 25*  CREATININE 0.93 0.81  CALCIUM 9.4 9.5  MG -- --  PHOS -- --   CBC:  Basename 06/28/12 0515 06/27/12 0525  WBC 5.1 5.7  NEUTROABS -- --  HGB 10.7* 11.4*  HCT 31.6* 34.0*  MCV 96.0 95.2  PLT 309 331   INR: 1.48-- pharmacy dosing Warfarin  Radiology/Studies:  Dg Chest 2 View 06/22/2012  *RADIOLOGY REPORT*  Clinical Data: Post pacemaker insertion, chest soreness  CHEST - 2 VIEW  Comparison: Portable chest x-ray of 06/14/2012  Findings: The endotracheal tube and NG tube have been removed.  No pneumothorax is seen.  Permanent pacemaker is now present. Cardiomegaly is stable and there may be minimal pulmonary vascular congestion present.  IMPRESSION:  1.  Permanent pacemaker now present.  No pneumothorax. 2.   Cardiomegaly.  Question mild pulmonary vascular congestion. 3.  Endotracheal tube removed.   Original Report Authenticated By: Dwyane Dee, M.D.    PHYSICAL EXAM Left chest without hematoma or ecchymosis Well developed and nourished in no acute distress HENT normal Neck supple with JVP-flat Carotids brisk and full without bruits Clear Regular rate and rhythm, no murmurs or gallops Abd-soft with active BS without hepatomegaly No Clubbing cyanosis edema Skin-warm and dry; R foremarm cellulitis A & Oriented  Grossly normal sensory and motor function   Appt made with Gunnar Fusi in our [place] office for Monday, December 2nd at 11:30AM to do wound check and turn rate down to 80bpm.    Patient Active Hospital Problem List: Atrial fibrillation ()   Nonischemic cardiomyopathy (06/12/2012)   Chronic systolic CHF (congestive heart failure)    Pacemaker-Medtronic (06/22/2012)   Complete heart block--s/p AV ablation (06/23/2012)    She is much improved and will look at discharge   Pt to be discharged to home Will modify meds for blood pressure and will need close followup of this with PA() within next two weeksl;; have reviewed home meds; need to resume zoloft;  Cephalexin for 3 more days Needs  pacer clinic appt [place] or [place] next week

## 2012-06-30 ENCOUNTER — Encounter (HOSPITAL_COMMUNITY): Payer: Self-pay | Admitting: Cardiology

## 2012-07-03 ENCOUNTER — Encounter: Payer: PRIVATE HEALTH INSURANCE | Admitting: Internal Medicine

## 2012-07-03 ENCOUNTER — Encounter: Payer: Self-pay | Admitting: Internal Medicine

## 2012-07-03 ENCOUNTER — Telehealth: Payer: Self-pay | Admitting: Adult Health

## 2012-07-03 ENCOUNTER — Ambulatory Visit (INDEPENDENT_AMBULATORY_CARE_PROVIDER_SITE_OTHER): Payer: BC Managed Care – PPO | Admitting: *Deleted

## 2012-07-03 DIAGNOSIS — I442 Atrioventricular block, complete: Secondary | ICD-10-CM

## 2012-07-03 DIAGNOSIS — I4891 Unspecified atrial fibrillation: Secondary | ICD-10-CM

## 2012-07-03 LAB — PACEMAKER DEVICE OBSERVATION
ATRIAL PACING PM: 0
BMOD-0003RV: 30
BRDY-0002RV: 80 {beats}/min
RV LEAD IMPEDENCE PM: 551 Ohm
VENTRICULAR PACING PM: 95.42

## 2012-07-03 NOTE — Telephone Encounter (Signed)
PT BP IS 92/60

## 2012-07-03 NOTE — Telephone Encounter (Signed)
Pt and pt sister advised that the BP was taken by the [facility] nurse earlier today, pt is a symptomatic, pt denies any other sxs, denies chest pain./pressure/dizziness nor SOB, advised if any sxs arise to please go to the ER for evaluation, per this BP and no other sxs are no sign for concern at this point, pt/sister understood all instructions

## 2012-07-03 NOTE — Progress Notes (Signed)
Wound check-PPM 

## 2012-07-04 ENCOUNTER — Telehealth: Payer: Self-pay | Admitting: Cardiology

## 2012-07-04 NOTE — Telephone Encounter (Signed)
BP 88/50 RIGHT 84/60 LEFT HR 80 AND REGULAR

## 2012-07-04 NOTE — Telephone Encounter (Signed)
Per Byrd Hesselbach at [facility], patient is not having any symptoms or complaints at this time

## 2012-07-10 ENCOUNTER — Ambulatory Visit (INDEPENDENT_AMBULATORY_CARE_PROVIDER_SITE_OTHER): Payer: PRIVATE HEALTH INSURANCE | Admitting: Adult Health

## 2012-07-10 ENCOUNTER — Encounter: Payer: Self-pay | Admitting: Adult Health

## 2012-07-10 VITALS — BP 88/58 | HR 85 | Ht 65.0 in | Wt 112.8 lb

## 2012-07-10 DIAGNOSIS — I5023 Acute on chronic systolic (congestive) heart failure: Secondary | ICD-10-CM

## 2012-07-10 DIAGNOSIS — I4891 Unspecified atrial fibrillation: Secondary | ICD-10-CM

## 2012-07-10 DIAGNOSIS — Z950 Presence of cardiac pacemaker: Secondary | ICD-10-CM

## 2012-07-10 DIAGNOSIS — I509 Heart failure, unspecified: Secondary | ICD-10-CM

## 2012-07-10 LAB — CBC
MCH: 31.7 pg (ref 26.0–34.0)
MCHC: 33.1 g/dL (ref 30.0–36.0)
MCV: 95.8 fL (ref 78.0–100.0)
Platelets: 236 10*3/uL (ref 150–400)
RDW: 14.9 % (ref 11.5–15.5)

## 2012-07-10 LAB — FUNGUS CULTURE W SMEAR

## 2012-07-10 NOTE — Patient Instructions (Addendum)
Your physician recommends that you schedule a follow-up appointment in: 1 month  Your physician recommends that you return for lab work in: Today

## 2012-07-10 NOTE — Progress Notes (Deleted)
Name: [name] [name]    DOB: 06-17-1956  Age: 56 y.o.  MR#: [id number]       PCP:  Fredirick Maudlin, MD      Insurance: @PAYORNAME @   CC:   No chief complaint on file.   VS BP 88/58  Pulse 85  Ht 5\' 5"  (1.651 m)  Wt 112 lb 12.8 oz (51.166 kg)  BMI 18.77 kg/m2  Weights Current Weight  07/10/12 112 lb 12.8 oz (51.166 kg)  06/28/12 107 lb 12.9 oz (48.9 kg)  06/28/12 107 lb 12.9 oz (48.9 kg)    Blood Pressure  BP Readings from Last 3 Encounters:  07/10/12 88/58  06/28/12 105/56  06/28/12 105/56     Admit date:  (Not on file) Last encounter with RMR:  07/03/2012   Allergy No Known Allergies  Current Outpatient Prescriptions  Medication Sig Dispense Refill  . ALPRAZolam (XANAX) 0.25 MG tablet Take 0.5 mg by mouth at bedtime as needed. For anxiety      . digoxin (LANOXIN) 0.125 MG tablet Take 1 tablet (0.125 mg total) by mouth daily.  30 tablet  11  . feeding supplement (ENSURE COMPLETE) LIQD Take 237 mLs by mouth 2 (two) times daily between meals.      . furosemide (LASIX) 20 MG tablet Take 1 tablet (20 mg total) by mouth daily.  30 tablet  11  . insulin aspart (NOVOLOG) 100 UNIT/ML injection Inject 0-5 Units into the skin as needed.      . insulin glargine (LANTUS) 100 UNIT/ML injection Inject 5 Units into the skin at bedtime.      Marland Kitchen lisinopril (PRINIVIL,ZESTRIL) 5 MG tablet Take 1 tablet (5 mg total) by mouth daily.  30 tablet  11  . magnesium oxide (MAG-OX) 400 (241.3 MG) MG tablet Take 1 tablet (400 mg total) by mouth daily.  30 tablet  11  . metoprolol succinate (TOPROL-XL) 50 MG 24 hr tablet Take 1 tablet (50 mg total) by mouth daily. Take with or immediately following a meal.  30 tablet  11  . pantoprazole (PROTONIX) 40 MG tablet Take 1 tablet (40 mg total) by mouth daily.  30 tablet  12  . Rivaroxaban (XARELTO) 20 MG TABS Take 1 tablet (20 mg total) by mouth daily with supper.  30 tablet  11  . sertraline (ZOLOFT) 50 MG tablet Take 50 mg by mouth daily.      Marland Kitchen SPIRIVA  HANDIHALER 18 MCG inhalation capsule Place 18 mcg into inhaler and inhale daily.       Marland Kitchen zolpidem (AMBIEN) 5 MG tablet Take 5 mg by mouth at bedtime as needed. For sleep      . furosemide (LASIX) 40 MG tablet Take 20 mg by mouth daily.      . [DISCONTINUED] insulin glargine (LANTUS) 100 UNIT/ML injection Inject 5 Units into the skin at bedtime.  10 mL  12  . [DISCONTINUED] pantoprazole (PROTONIX) 40 MG tablet Take 40 mg by mouth daily.      . [DISCONTINUED] sertraline (ZOLOFT) 50 MG tablet Take 50 mg by mouth daily.      . [DISCONTINUED] tiotropium (SPIRIVA) 18 MCG inhalation capsule Place 18 mcg into inhaler and inhale daily.        Discontinued Meds:    Medications Discontinued During This Encounter  Medication Reason  . ALPRAZolam (XANAX) 0.5 MG tablet Error  . insulin aspart (NOVOLOG) 100 UNIT/ML injection Error  . insulin glargine (LANTUS) 100 UNIT/ML injection Error  . pantoprazole (PROTONIX) 40  MG tablet Error  . sertraline (ZOLOFT) 50 MG tablet Error  . tiotropium (SPIRIVA) 18 MCG inhalation capsule Error  . insulin aspart (NOVOLOG) 100 UNIT/ML injection     Patient Active Problem List  Diagnosis  . Atrial fibrillation with RVR  . Right bundle branch block  . Anxiety  . Anemia  . Hypertension  . Nonischemic cardiomyopathy  . Acute on chronic systolic CHF (congestive heart failure), NYHA class 4  . Pacemaker-Medtronic  . Complete heart block--s/p AV ablation  . CHF (congestive heart failure)  . Atrial fibrillation with rapid ventricular response  . COPD exacerbation  . Anxiety  . Depression  . Hypokalemia  . Diabetes mellitus due to underlying condition  . Anemia    LABS Clinical Support on 07/03/2012  Component Date Value  . DEVICE MODEL PM 07/03/2012 [id number]   . [id number] 07/03/2012 Lewayne Bunting   M.D.   . Sherlon Handing 07/03/2012 Lewayne Bunting   M.D.   . Sherlon Handing 07/03/2012 Lewayne Bunting   M.D.   . PACEART TECH NOTES PM 07/03/2012                      Value:Wound check appointment. Steri-strips removed. Wound without redness or edema. Incision edges approximated, wound well healed. Normal device function. Thresholds, sensing, and impedances consistent with implant measurements. Device programmed at                          3.5V/auto capture programmed on for extra safety margin until 3 month visit. Histogram distribution appropriate for patient and level of activity. A-fib, + xarelto.  Base rate to 80 and rate response on today.  Heart rates > 100bpm noted.  Patient                          educated about wound care, arm mobility, lifting restrictions. ROV in 3 months with implanting physician.  . ATRIAL PACING PM 07/03/2012 0.00   . VENTRICULAR PACING PM 07/03/2012 95.42   . BATTERY VOLTAGE 07/03/2012 3.0722   . AL IMPEDENCE PM 07/03/2012 608   . RV LEAD IMPEDENCE PM 07/03/2012 551   . AL AMPLITUDE 07/03/2012 2.375   . RV LEAD AMPLITUDE 07/03/2012 10.75   . RV LEAD THRESHOLD 07/03/2012 0.5   . BRDY-0001RV 07/03/2012 VVIR   . BRDY-0002RV 07/03/2012 80   . BRDY-0004RV 07/03/2012 120   . BRDY-0005RV 07/03/2012 /   . BRDY-0009RV 07/03/2012 No   . BRDY-0010RV 07/03/2012 MVP Off   . BMOD-0001RV 07/03/2012 Medium Low   . BMOD-0003RV 07/03/2012 30 sec   . BMOD-0004RV 07/03/2012 Exercise   . BMOD-0005RV 07/03/2012 95   Admission on 06/12/2012, Discharged on 06/28/2012  No results displayed because visit has over 200 results.    Admission on 06/04/2012, Discharged on 06/09/2012  No results displayed because visit has over 200 results.       Results for this Opt Visit:     Results for orders placed in visit on 07/03/12  PACEMAKER DEVICE OBSERVATION      Component Value Range   DEVICE MODEL PM [id number]     [id number] Lewayne Bunting   M.D.     [id number] Lewayne Bunting   M.D.     [id number] Lewayne Bunting   M.D.     [facility] NOTES PM       Value: Wound check appointment. Steri-strips removed. Wound without redness or edema.  Incision edges  approximated, wound well healed. Normal device function. Thresholds, sensing, and impedances consistent with implant measurements. Device programmed at      3.5V/auto capture programmed on for extra safety margin until 3 month visit. Histogram distribution appropriate for patient and level of activity. A-fib, + xarelto.  Base rate to 80 and rate response on today.  Heart rates > 100bpm noted.  Patient      educated about wound care, arm mobility, lifting restrictions. ROV in 3 months with implanting physician.   ATRIAL PACING PM 0.00     VENTRICULAR PACING PM 95.42     BATTERY VOLTAGE 3.0722     AL IMPEDENCE PM 608     RV LEAD IMPEDENCE PM 551     AL AMPLITUDE 2.375     RV LEAD AMPLITUDE 10.75     RV LEAD THRESHOLD 0.5     BRDY-0001RV VVIR     BRDY-0002RV 80     BRDY-0004RV 120     BRDY-0005RV /     BRDY-0009RV No     BRDY-0010RV MVP Off     BMOD-0001RV Medium Low     BMOD-0003RV 30 sec     BMOD-0004RV Exercise     BMOD-0005RV 95      EKG Orders placed in visit on 07/10/12  . EKG 12-LEAD     Prior Assessment and Plan Problem List as of 07/10/2012            Cardiology Problems   Atrial fibrillation with RVR   Last Assessment & Plan Note   02/15/2012 Office Visit Addendum 02/15/2012  5:15 PM by Jodelle Gross, NP    She is rate controlled on current mediations.She is on ASA only with CHADS score of 1. No changes in her medications at this time.    Right bundle branch block   Hypertension   Last Assessment & Plan Note   02/15/2012 Office Visit Signed 02/15/2012  5:04 PM by Jodelle Gross, NP    Currently well controlled on mediations. No changes at this time.    CHF (congestive heart failure)   Atrial fibrillation with rapid ventricular response   Nonischemic cardiomyopathy   Acute on chronic systolic CHF (congestive heart failure), NYHA class 4   Complete heart block--s/p AV ablation     Other   Anxiety   Anemia   Last Assessment & Plan Note    02/15/2012 Office Visit Signed 02/15/2012  5:03 PM by Jodelle Gross, NP    Most recent labs drawn on 02/02/2012 Hgb 10.8 and Hct 32.2. PLTs 300. BMET WNL.  Will continue to monitor this for need for iron supplements.    COPD exacerbation   Anxiety   Depression   Hypokalemia   Diabetes mellitus due to underlying condition   Anemia   Pacemaker-Medtronic       Imaging: Dg Chest 2 View  06/22/2012  *RADIOLOGY REPORT*  Clinical Data: Post pacemaker insertion, chest soreness  CHEST - 2 VIEW  Comparison: Portable chest x-ray of 06/14/2012  Findings: The endotracheal tube and NG tube have been removed.  No pneumothorax is seen.  Permanent pacemaker is now present. Cardiomegaly is stable and there may be minimal pulmonary vascular congestion present.  IMPRESSION:  1.  Permanent pacemaker now present.  No pneumothorax. 2.  Cardiomegaly.  Question mild pulmonary vascular congestion. 3.  Endotracheal tube removed.   Original Report Authenticated By: Dwyane Dee, M.D.    Dg Chest Port 1 View  06/14/2012  *RADIOLOGY REPORT*  Clinical Data: Ventilator, shortness of breath.  PORTABLE CHEST - 1 VIEW  Comparison: 06/13/2012  Findings: Support devices including endotracheal tube are unchanged.  There is left base atelectasis or infiltrate, stable. Right lung is clear.  Heart is mildly enlarged.  No visible effusions.  No acute bony abnormality.  IMPRESSION: Stable left base atelectasis or infiltrate.  Mild cardiomegaly.   Original Report Authenticated By: Charlett Nose, M.D.    Dg Chest Port 1 View  06/13/2012  *RADIOLOGY REPORT*  Clinical Data: Ventilator.  PORTABLE CHEST - 1 VIEW  Comparison: 06/12/2012  Findings: Endotracheal tube and NG tube remain in place, unchanged. Left base atelectasis or infiltrate, improved since prior study. Right lung is clear.  Heart is borderline in size.  No acute bony abnormality.  IMPRESSION: Decreasing left base atelectasis or infiltrate.   Original Report Authenticated By:  Charlett Nose, M.D.    Portable Chest Xray To Verify Ett/ Central Line Placement  06/12/2012  *RADIOLOGY REPORT*  Clinical Data: ETT placement.  PORTABLE CHEST - 1 VIEW  Comparison: Chest x-ray 06/12/2012.  Findings: An endotracheal tube is in place with tip 4.3 cm above the carina. A nasogastric tube is seen extending into the stomach, however, the tip of the nasogastric tube extends below the lower margin of the image.  Compared to the prior examination there is now complete opacification of the base of the left hemithorax and slight shift of structures toward the left hemithorax, suggestive of complete left lower lobe atelectasis.  Lungs are otherwise clear.  No definite pleural effusions.  No evidence of pulmonary edema.  Mild cardiomegaly is unchanged.  Atherosclerosis of the thoracic aorta.  IMPRESSION: 1.  Support apparatus, as above. 2.  Interval development of complete left lower lobe atelectasis. Correlation for signs and symptoms of aspiration or mucous plugging is recommended.  These results were called by telephone on 06/12/2012 at 10:40 a.m. to Dr. Molli Knock, who verbally acknowledged these results.   Original Report Authenticated By: Trudie Reed, M.D.    Dg Chest Port 1 View  06/12/2012  *RADIOLOGY REPORT*  Clinical Data: Nausea, vomiting.  Post intubation.  PORTABLE CHEST - 1 VIEW  Comparison: 10/28/2004  Findings: Endotracheal tube is 4 cm above the carina.  There is cardiomegaly.  No confluent airspace opacities or effusions.  No acute bony abnormality.  IMPRESSION: Endotracheal tube 4 cm above the carina.  Cardiomegaly.   Original Report Authenticated By: Charlett Nose, M.D.    Dg Abd Portable 1v  06/12/2012  *RADIOLOGY REPORT*  Clinical Data: Abdominal pain  PORTABLE ABDOMEN - 1 VIEW  Comparison: None.  Findings: NG tube tip projects over the antrum of the stomach.  No disproportionate dilatation of small bowel.  Right femoral vascular catheter is in place.  No obvious free  intraperitoneal gas.  IMPRESSION: Nonobstructive bowel gas pattern.  NG tube.   Original Report Authenticated By: Jolaine Click, M.D.      [facility] Calculation: Score not calculated. Missing: Total Cholesterol

## 2012-07-10 NOTE — Progress Notes (Signed)
HPI: [name] [name] is a very pleasant 56 year old female patient of both Dr. Aspinwall Bing, Dr. Sharrell Ku, who we are following for ongoing assessment and treatment of paroxysmal atrial fibrillation, with history of thyroid disease, anemia, and anxiety. [name] [name] comes today after a lengthy hospitalization from 06/12/2012 to 11 27 2013 secondary to cardiac arrest, in the setting of hyperkalemia with bradycardia and ventricular tachycardia, she also had VDRL, non-ischemic heart myopathy, with chronic systolic CHF. She had a subsequent pacemaker insertion after AV node ablation and left heart catheterization during that hospitalization. The patient also had 2 DCCVs which were unsuccessful. She is now placed on rivoroxaban.    During hospitalization the patient was also seen by critical care medicine, who managed ventilatory support. They treated her for pneumonia as well. Echocardiogram completed during hospitalization revealed left ventricular dysfunction which subsequently led to cardiac catheterization. It was found that she had a nonischemic cardiomyopathy with LVEF of 35%. She had an EP study by Dr. Ladona Ridgel on with AV nodal ablation on 06/22/2012. A right biventricular pacemaker was placed on 06/22/2012 as well. The left ventricular lead was found not to be functioning normally but was unable to be repositioned. She is currently being right ventricular paced with followup pacemaker clinic pacemaker adjustments to rate of 80.    The patient remained euvolemic on day of discharge. She lost approximately 6 pounds during hospitalization with a dry weight 108 pounds. Since returning home, the patient has begun to eat better and have better nutrition and has gained up to 112 pounds. She denies any shortness of breath, dyspnea on exertion, chest pain, bleeding, or palpitations. She is no longer living alone, but living with sisters. She has 2 sons who check on her often and arrangements are being made for a new  living situation so that she will not live alone. On discharge the patient's potassium was 4.0, sodium 136, chloride 98, creatinine 0.93. She is in need of a followup BMET.   No Known Allergies  Current Outpatient Prescriptions  Medication Sig Dispense Refill  . ALPRAZolam (XANAX) 0.25 MG tablet Take 0.5 mg by mouth at bedtime as needed. For anxiety      . digoxin (LANOXIN) 0.125 MG tablet Take 1 tablet (0.125 mg total) by mouth daily.  30 tablet  11  . feeding supplement (ENSURE COMPLETE) LIQD Take 237 mLs by mouth 2 (two) times daily between meals.      . furosemide (LASIX) 20 MG tablet Take 1 tablet (20 mg total) by mouth daily.  30 tablet  11  . insulin aspart (NOVOLOG) 100 UNIT/ML injection Inject 0-5 Units into the skin as needed.      . insulin glargine (LANTUS) 100 UNIT/ML injection Inject 5 Units into the skin at bedtime.      Marland Kitchen lisinopril (PRINIVIL,ZESTRIL) 5 MG tablet Take 1 tablet (5 mg total) by mouth daily.  30 tablet  11  . magnesium oxide (MAG-OX) 400 (241.3 MG) MG tablet Take 1 tablet (400 mg total) by mouth daily.  30 tablet  11  . metoprolol succinate (TOPROL-XL) 50 MG 24 hr tablet Take 1 tablet (50 mg total) by mouth daily. Take with or immediately following a meal.  30 tablet  11  . pantoprazole (PROTONIX) 40 MG tablet Take 1 tablet (40 mg total) by mouth daily.  30 tablet  12  . Rivaroxaban (XARELTO) 20 MG TABS Take 1 tablet (20 mg total) by mouth daily with supper.  30 tablet  11  .  sertraline (ZOLOFT) 50 MG tablet Take 50 mg by mouth daily.      Marland Kitchen SPIRIVA HANDIHALER 18 MCG inhalation capsule Place 18 mcg into inhaler and inhale daily.       Marland Kitchen zolpidem (AMBIEN) 5 MG tablet Take 5 mg by mouth at bedtime as needed. For sleep      . furosemide (LASIX) 40 MG tablet Take 20 mg by mouth daily.      . [DISCONTINUED] insulin glargine (LANTUS) 100 UNIT/ML injection Inject 5 Units into the skin at bedtime.  10 mL  12  . [DISCONTINUED] pantoprazole (PROTONIX) 40 MG tablet Take 40  mg by mouth daily.      . [DISCONTINUED] sertraline (ZOLOFT) 50 MG tablet Take 50 mg by mouth daily.      . [DISCONTINUED] tiotropium (SPIRIVA) 18 MCG inhalation capsule Place 18 mcg into inhaler and inhale daily.        Past Medical History  Diagnosis Date  . Dysrhythmia   . COPD (chronic obstructive pulmonary disease)   . Depression   . Paroxysmal supraventricular tachycardia     Lost to followup-2006  . Atrial fibrillation     Echocardiogram in 2004-borderline LV function; no valvular abnormalities  . Right bundle branch block   . Anxiety   . Anemia     2011: Hemoglobin of 11.4; hematocrit of 33.5; normal MCV    Past Surgical History  Procedure Date  . Breast biopsy     left, [facility]  . Cervical conization w/bx 08/11/2011    Procedure: CONIZATION CERVIX WITH BIOPSY;  Surgeon: Lazaro Arms, MD;  Location: [facility]S;  Service: Gynecology;  Laterality: N/A;  Laser Conization of Cervix  . Breast excisional biopsy     Left x2  . Tee without cardioversion 06/19/2012    Procedure: TRANSESOPHAGEAL ECHOCARDIOGRAM (TEE);  Surgeon: Vesta Mixer, MD;  Location: [facility] ENDOSCOPY;  Service: Cardiovascular;  Laterality: N/A;  . Cardioversion 06/19/2012    Procedure: CARDIOVERSION;  Surgeon: Vesta Mixer, MD;  Location: [facility] ENDOSCOPY;  Service: Cardiovascular;;  . Cardioversion 06/21/2012    Procedure: CARDIOVERSION;  Surgeon: Cassell Clement, MD;  Location: [facility] OR;  Service: Cardiovascular;  Laterality: N/A;    ROS:.NROS PHYSICAL EXAM BP 88/58  Pulse 85  Ht 5\' 5"  (1.651 m)  Wt 112 lb 12.8 oz (51.166 kg)  BMI 18.77 kg/m2  EKG: Electronic ventricular pacemaker, rate of 85 bpm.  ASSESSMENT AND PLAN

## 2012-07-10 NOTE — Assessment & Plan Note (Signed)
Pacemaker was checked last week in the Pacemaker clinic. She is to follow up with Dr.Taylor in March.

## 2012-07-10 NOTE — Assessment & Plan Note (Signed)
Current heart rate is 6 has excellent control. She is tolerating medications without problem. She is currently ventricularly paced.Xarelto has not caused any bleeding issues. I will check a CBC to evaluate for any anemia.  she is currently asymptomatic and slowly getting her strength back back. She has home health nurses in attendance and is living with family members who are extremely supportive. We will see her in one month for close follow up.

## 2012-07-10 NOTE — Assessment & Plan Note (Signed)
No evidence of CHF. She has gained some weight but is eating better with nutritional supplements also. Will continue close follow-up.

## 2012-07-11 LAB — BASIC METABOLIC PANEL
BUN: 26 mg/dL — ABNORMAL HIGH (ref 6–23)
Chloride: 103 mEq/L (ref 96–112)
Glucose, Bld: 74 mg/dL (ref 70–99)
Potassium: 4.9 mEq/L (ref 3.5–5.3)

## 2012-07-25 LAB — AFB CULTURE WITH SMEAR (NOT AT ARMC)

## 2012-08-14 ENCOUNTER — Ambulatory Visit (INDEPENDENT_AMBULATORY_CARE_PROVIDER_SITE_OTHER): Payer: BC Managed Care – PPO | Admitting: Cardiology

## 2012-08-14 ENCOUNTER — Encounter: Payer: Self-pay | Admitting: Cardiology

## 2012-08-14 VITALS — BP 98/48 | HR 83 | Ht 65.0 in | Wt 114.4 lb

## 2012-08-14 DIAGNOSIS — D649 Anemia, unspecified: Secondary | ICD-10-CM

## 2012-08-14 DIAGNOSIS — I4891 Unspecified atrial fibrillation: Secondary | ICD-10-CM

## 2012-08-14 DIAGNOSIS — I469 Cardiac arrest, cause unspecified: Secondary | ICD-10-CM

## 2012-08-14 DIAGNOSIS — I428 Other cardiomyopathies: Secondary | ICD-10-CM

## 2012-08-14 DIAGNOSIS — I10 Essential (primary) hypertension: Secondary | ICD-10-CM

## 2012-08-14 NOTE — Assessment & Plan Note (Signed)
Issues with atrial fibrillation are now resolved following radiofrequency AV node ablation and pacing.  She is scheduled for reevaluation by Dr. Ladona Ridgel in 2 months.

## 2012-08-14 NOTE — Assessment & Plan Note (Signed)
Patient reports recent laboratory testing with Dr. Juanetta Gosling. We will determine whether CBC has been repeated.

## 2012-08-14 NOTE — Assessment & Plan Note (Signed)
Although she presented with substantial left ventricular dysfunction and subsequently suffered a cardiac arrest, there is no evidence that she has primary ventricular tachycardia or ventricular fibrillation. Accordingly, implantation of an AICD is not necessarily warranted.
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
## 2012-08-14 NOTE — Patient Instructions (Signed)
Your physician recommends that you schedule a follow-up appointment in: 3 months  Your physician has requested that you have an echocardiogram. Echocardiography is a painless test that uses sound waves to create images of your heart. It provides your doctor with information about the size and shape of your heart and how well your heart's chambers and valves are working. This procedure takes approximately one hour. There are no restrictions for this procedure.  Your physician has recommended you make the following change in your medication:  1 - STOP Digoxin

## 2012-08-14 NOTE — Assessment & Plan Note (Addendum)
There is a reasonable likelihood that cardiomyopathy was caused by tachycardia or represents a stress-induced episode of Takotsubo's. A repeat echocardiogram will be performed in 2 months to determine if LV systolic function recovers.  Mitral regurgitation was graded as moderate to severe on her most recent echocardiogram. No murmur is heard at this visit, which may indicate improvement in MR and thus in the underlying cardiomyopathy. This will be reassessed on her repeat echocardiogram

## 2012-08-14 NOTE — Progress Notes (Deleted)
Name: [name] [name]    DOB: 07/14/1956  Age: 57 y.o.  MR#: [id number]       PCP:  Fredirick Maudlin, MD      Insurance: @PAYORNAME @   CC:   No chief complaint on file.  MEDICATION LIST LABS PER DR HAWKINS 06/12/12  VS BP 98/48  Pulse 83  Ht 5\' 5"  (1.651 m)  Wt 114 lb 6.4 oz (51.891 kg)  BMI 19.04 kg/m2  SpO2 99%  Weights Current Weight  08/14/12 114 lb 6.4 oz (51.891 kg)  07/10/12 112 lb 12.8 oz (51.166 kg)  06/28/12 107 lb 12.9 oz (48.9 kg)    Blood Pressure  BP Readings from Last 3 Encounters:  08/14/12 98/48  07/10/12 88/58  06/28/12 105/56     Admit date:  (Not on file) Last encounter with RMR:  07/04/2012   Allergy No Known Allergies  Current Outpatient Prescriptions  Medication Sig Dispense Refill  . ALPRAZolam (XANAX) 0.25 MG tablet Take 0.5 mg by mouth at bedtime as needed. For anxiety      . digoxin (LANOXIN) 0.125 MG tablet Take 1 tablet (0.125 mg total) by mouth daily.  30 tablet  11  . feeding supplement (ENSURE COMPLETE) LIQD Take 237 mLs by mouth 2 (two) times daily between meals.      . furosemide (LASIX) 20 MG tablet Take 1 tablet (20 mg total) by mouth daily.  30 tablet  11  . furosemide (LASIX) 40 MG tablet Take 20 mg by mouth daily.      . insulin aspart (NOVOLOG) 100 UNIT/ML injection Inject 0-5 Units into the skin as needed.      . insulin glargine (LANTUS) 100 UNIT/ML injection Inject 5 Units into the skin at bedtime.      Marland Kitchen lisinopril (PRINIVIL,ZESTRIL) 5 MG tablet Take 1 tablet (5 mg total) by mouth daily.  30 tablet  11  . metoprolol succinate (TOPROL-XL) 50 MG 24 hr tablet Take 1 tablet (50 mg total) by mouth daily. Take with or immediately following a meal.  30 tablet  11  . pantoprazole (PROTONIX) 40 MG tablet Take 1 tablet (40 mg total) by mouth daily.  30 tablet  12  . Rivaroxaban (XARELTO) 20 MG TABS Take 1 tablet (20 mg total) by mouth daily with supper.  30 tablet  11  . sertraline (ZOLOFT) 50 MG tablet Take 50 mg by mouth daily.      Marland Kitchen  SPIRIVA HANDIHALER 18 MCG inhalation capsule Place 18 mcg into inhaler and inhale daily.       Marland Kitchen zolpidem (AMBIEN) 5 MG tablet Take 5 mg by mouth at bedtime as needed. For sleep        Discontinued Meds:    Medications Discontinued During This Encounter  Medication Reason  . magnesium oxide (MAG-OX) 400 (241.3 MG) MG tablet Error    Patient Active Problem List  Diagnosis  . Atrial fibrillation with RVR  . Anxiety and depression  . Anemia, normocytic normochromic  . Hypertension  . Complete heart block--s/p AV ablation  . COPD (chronic obstructive pulmonary disease)  . Diabetes mellitus, type 2  . Cardiac arrest  . Nonischemic cardiomyopathy    LABS Office Visit on 07/10/2012  Component Date Value  . Sodium 07/10/2012 139   . Potassium 07/10/2012 4.9   . Chloride 07/10/2012 103   . CO2 07/10/2012 27   . Glucose, Bld 07/10/2012 74   . BUN 07/10/2012 26*  . Creat 07/10/2012 0.99   . Calcium  07/10/2012 9.4   . WBC 07/10/2012 5.3   . RBC 07/10/2012 3.56*  . Hemoglobin 07/10/2012 11.3*  . HCT 07/10/2012 34.1*  . MCV 07/10/2012 95.8   . [facility] 07/10/2012 31.7   . MCHC 07/10/2012 33.1   . RDW 07/10/2012 14.9   . Platelets 07/10/2012 236   Clinical Support on 07/03/2012  Component Date Value  . DEVICE MODEL PM 07/03/2012 [id number]   . DEV-[id number] 07/03/2012 Lewayne Bunting   M.D.   . Sherlon Handing 07/03/2012 Lewayne Bunting   M.D.   . Sherlon Handing 07/03/2012 Lewayne Bunting   M.D.   . PACEART TECH NOTES PM 07/03/2012                     Value:Wound check appointment. Steri-strips removed. Wound without redness or edema. Incision edges approximated, wound well healed. Normal device function. Thresholds, sensing, and impedances consistent with implant measurements. Device programmed at                          3.5V/auto capture programmed on for extra safety margin until 3 month visit. Histogram distribution appropriate for patient and level of activity. A-fib, + xarelto.  Base rate to  80 and rate response on today.  Heart rates > 100bpm noted.  Patient                          educated about wound care, arm mobility, lifting restrictions. ROV in 3 months with implanting physician.  . ATRIAL PACING PM 07/03/2012 0.00   . VENTRICULAR PACING PM 07/03/2012 95.42   . BATTERY VOLTAGE 07/03/2012 3.0722   . AL IMPEDENCE PM 07/03/2012 608   . RV LEAD IMPEDENCE PM 07/03/2012 551   . AL AMPLITUDE 07/03/2012 2.375   . RV LEAD AMPLITUDE 07/03/2012 10.75   . RV LEAD THRESHOLD 07/03/2012 0.5   . BRDY-0001RV 07/03/2012 VVIR   . BRDY-0002RV 07/03/2012 80   . BRDY-0004RV 07/03/2012 120   . BRDY-0005RV 07/03/2012 /   . BRDY-0009RV 07/03/2012 No   . BRDY-0010RV 07/03/2012 MVP Off   . BMOD-0001RV 07/03/2012 Medium Low   . BMOD-0003RV 07/03/2012 30 sec   . BMOD-0004RV 07/03/2012 Exercise   . BMOD-0005RV 07/03/2012 95   Admission on 06/12/2012, Discharged on 06/28/2012  No results displayed because visit has over 200 results.    Admission on 06/04/2012, Discharged on 06/09/2012  No results displayed because visit has over 200 results.       Results for this Opt Visit:     Results for orders placed in visit on 07/10/12  BASIC METABOLIC PANEL      Component Value Range   Sodium 139  135 - 145 mEq/L   Potassium 4.9  3.5 - 5.3 mEq/L   Chloride 103  96 - 112 mEq/L   CO2 27  19 - 32 mEq/L   Glucose, Bld 74  70 - 99 mg/dL   BUN 26 (*) 6 - 23 mg/dL   Creat 7.82  9.56 - 2.13 mg/dL   Calcium 9.4  8.4 - 08.6 mg/dL  CBC      Component Value Range   WBC 5.3  4.0 - 10.5 K/uL   RBC 3.56 (*) 3.87 - 5.11 MIL/uL   Hemoglobin 11.3 (*) 12.0 - 15.0 g/dL   HCT 57.8 (*) 46.9 - 62.9 %   MCV 95.8  78.0 - 100.0 fL   MCH 31.7  26.0 - 34.0 pg  MCHC 33.1  30.0 - 36.0 g/dL   RDW 96.0  45.4 - 09.8 %   Platelets 236  150 - 400 K/uL    EKG Orders placed in visit on 07/10/12  . EKG 12-LEAD     Prior Assessment and Plan Problem List as of 08/14/2012            Cardiology Problems   Atrial  fibrillation with RVR   Last Assessment & Plan Note   07/10/2012 Office Visit Signed 07/10/2012  3:01 PM by Jodelle Gross, NP    Current heart rate is 6 has excellent control. She is tolerating medications without problem. She is currently ventricularly paced.Xarelto has not caused any bleeding issues. I will check a CBC to evaluate for any anemia.  she is currently asymptomatic and slowly getting her strength back back. She has home health nurses in attendance and is living with family members who are extremely supportive. We will see her in one month for close follow up.    Hypertension   Last Assessment & Plan Note   02/15/2012 Office Visit Signed 02/15/2012  5:04 PM by Jodelle Gross, NP    Currently well controlled on mediations. No changes at this time.    Complete heart block--s/p AV ablation   Cardiac arrest   Nonischemic cardiomyopathy     Other   Anxiety and depression   Anemia, normocytic normochromic   Last Assessment & Plan Note   02/15/2012 Office Visit Signed 02/15/2012  5:03 PM by Jodelle Gross, NP    Most recent labs drawn on 02/02/2012 Hgb 10.8 and Hct 32.2. PLTs 300. BMET WNL.  Will continue to monitor this for need for iron supplements.    COPD (chronic obstructive pulmonary disease)   Diabetes mellitus, type 2       Imaging: No results found.   FRS Calculation: Score not calculated. Missing: Total Cholesterol

## 2012-08-29 ENCOUNTER — Telehealth: Payer: Self-pay | Admitting: Internal Medicine

## 2012-08-29 NOTE — Telephone Encounter (Signed)
Called pt and informed that we do not currently have samples in our office, however the [place] office does and I will contact her when the manager returns, in the meantime she can collect her income information to bring to the office in order to be placed on pt assistance for this medication, also advised pt that she will need to sign a release for allowing [place] to set her up for pt assistance, pt agreed and will await call to alert when medication is ready for pick up.

## 2012-08-29 NOTE — Telephone Encounter (Signed)
Called pt to advise we have 3 samples boxes of the Xarelto for her to come by and pick up, pt advised she will be in the office in the next few minutes, noted that she does not have some of the income information needed to sign up for the pt assistance program, advised that I will have a copy of what she needs as well have her sign the form when she comes into the office, pt understood

## 2012-08-29 NOTE — Telephone Encounter (Signed)
Patient wants to know if there is anything cheaper than Xarelto that she get in it's place. / tgs

## 2012-10-02 ENCOUNTER — Ambulatory Visit (INDEPENDENT_AMBULATORY_CARE_PROVIDER_SITE_OTHER): Payer: BC Managed Care – PPO | Admitting: Internal Medicine

## 2012-10-02 ENCOUNTER — Encounter: Payer: Self-pay | Admitting: Internal Medicine

## 2012-10-02 VITALS — BP 110/68 | HR 90 | Ht 63.5 in | Wt 119.0 lb

## 2012-10-02 DIAGNOSIS — I428 Other cardiomyopathies: Secondary | ICD-10-CM

## 2012-10-02 DIAGNOSIS — I5022 Chronic systolic (congestive) heart failure: Secondary | ICD-10-CM

## 2012-10-02 DIAGNOSIS — I4891 Unspecified atrial fibrillation: Secondary | ICD-10-CM

## 2012-10-02 DIAGNOSIS — Z950 Presence of cardiac pacemaker: Secondary | ICD-10-CM | POA: Insufficient documentation

## 2012-10-02 LAB — PACEMAKER DEVICE OBSERVATION
AL AMPLITUDE: 0.875 mv
AL IMPEDENCE PM: 513 Ohm
BATTERY VOLTAGE: 3.0212 V
BMOD-0003RV: 30
BMOD-0005RV: 95 {beats}/min
RV LEAD AMPLITUDE: 8 mv
RV LEAD IMPEDENCE PM: 608 Ohm

## 2012-10-02 NOTE — Assessment & Plan Note (Signed)
Her symptoms are currently class 2B. She will continue her current medical therapy and I have discussed the importance of exercise and a low sodium diet.  I will see her back in 6 months. I have asked that she call sooner if her heart failure worsens and I would refer for an epicardial lead in LV.

## 2012-10-02 NOTE — Assessment & Plan Note (Signed)
Her device is working normally but LV lead has diaphragmatic stim even at low outputs. Will follow.

## 2012-10-02 NOTE — Progress Notes (Signed)
HPI [name] [name] returns today for followup. She has a non-ischemic CM, chronic systolic CHF, uncontrolled atrial fib, s/p PPM and AV node ablation. Her PPM implant was complicated by difficulty placing an LV lead. At her final site, she had diaphragmatic stimulation and her LV lead was left turned off. Since then she remains sedentary. No syncope or peripheral edema or chest pain. She has class 2 CHF symptoms. She admits to sodium indiscretion. We discussed the importance of salt restriction. Will also discussed the possibility of epicardial LV lead insertion. No Known Allergies   Current Outpatient Prescriptions  Medication Sig Dispense Refill  . ALPRAZolam (XANAX) 0.25 MG tablet Take 0.5 mg by mouth at bedtime as needed. For anxiety      . calcium carbonate (TUMS - DOSED IN MG ELEMENTAL CALCIUM) 500 MG chewable tablet Chew 2 tablets by mouth 3 (three) times daily.      Marland Kitchen lactose free nutrition (BOOST) LIQD Take 237 mLs by mouth daily.      Marland Kitchen lisinopril (PRINIVIL,ZESTRIL) 5 MG tablet Take 1 tablet (5 mg total) by mouth daily.  30 tablet  11  . metoprolol succinate (TOPROL-XL) 50 MG 24 hr tablet Take 1 tablet (50 mg total) by mouth daily. Take with or immediately following a meal.  30 tablet  11  . pantoprazole (PROTONIX) 40 MG tablet Take 1 tablet (40 mg total) by mouth daily.  30 tablet  12  . Rivaroxaban (XARELTO) 20 MG TABS Take 1 tablet (20 mg total) by mouth daily with supper.  30 tablet  11  . sertraline (ZOLOFT) 50 MG tablet Take 50 mg by mouth daily.      Marland Kitchen SPIRIVA HANDIHALER 18 MCG inhalation capsule Place 18 mcg into inhaler and inhale daily.       Marland Kitchen zolpidem (AMBIEN) 5 MG tablet Take 5 mg by mouth at bedtime as needed. For sleep      . furosemide (LASIX) 40 MG tablet Take 20 mg by mouth daily.       No current facility-administered medications for this visit.     Past Medical History  Diagnosis Date  . COPD (chronic obstructive pulmonary disease)   . Anxiety and depression   .  Atrial fibrillation     Echocardiogram in 2004-borderline LV function; no valvular abnormalities; 06/2012: AV nodal ablation + biventricular pacemaker  . Anemia     2011: Hemoglobin of 11.4; hematocrit of 33.5; normal MCV  . Cardiac arrest 06/2012    16 day hospitalization; caused by hyperkalemia; multiple complications including CHF; permanent atrial fibrillation requiring AV nodal ablation + pacing  . Thyroid disease   . Nonischemic cardiomyopathy     Echo 06/2012: EF 30-35%, moderate to severe MR; cardiac cath 06/2012:30-40% D1 and D2, estimated EF-35%, MR-not graded; right bundle branch block    ROS:   All systems reviewed and negative except as noted in the HPI.   Past Surgical History  Procedure Laterality Date  . Breast biopsy      left, [facility]  . Cervical conization w/bx  08/11/2011    Procedure: CONIZATION CERVIX WITH BIOPSY;  Surgeon: Lazaro Arms, MD;  Location: [facility];  Service: Gynecology;  Laterality: N/A;  Laser Conization of Cervix  . Breast excisional biopsy      Left x2  . Tee without cardioversion  06/19/2012    Procedure: TRANSESOPHAGEAL ECHOCARDIOGRAM (TEE);  Surgeon: Vesta Mixer, MD;  Location[facility] ENDOSCOPY;  Service: Cardiovascular;  Laterality: N/A;  . Cardioversion  06/19/2012  Procedure: CARDIOVERSION;  Surgeon: Vesta Mixer, MD;  Location: [facility] ENDOSCOPY;  Service: Cardiovascular;;  . Cardioversion  06/21/2012    Procedure: CARDIOVERSION;  Surgeon: Cassell Clement, MD;  Location: [facility] OR;  Service: Cardiovascular;  Laterality: N/A;     Family History  Problem Relation Age of Onset  . Anesthesia problems Neg Hx   . Hypotension Neg Hx   . Malignant hyperthermia Neg Hx   . Pseudochol deficiency Neg Hx   . Cancer Father     carcinoma of the lung  . Cancer Sister     carcinoma of the lung     History   Social History  . Marital Status: Divorced    Spouse Name: N/A    Number of Children: 2  . Years of Education: N/A   Occupational History   . Surveyor, quantity  . 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 Smoking status: Never Smoker   . Smokeless tobacco: Never Used  . Alcohol Use: No  . Drug Use: No  . Sexually Active:    Other Topics Concern  . Not on file   Social History Narrative   ** Merged History Encounter **       ** Data from: 06/22/12 Enc Dept: [facility]       ** Data from: 06/05/12 Enc Dept: [facility]   Lives alone with good family support from sisters.            BP 110/68  Pulse 90  Ht 5' 3.5" (1.613 m)  Wt 119 lb (53.978 kg)  BMI 20.75 kg/m2  SpO2 96%  Physical Exam:  Well appearing middle aged woman, NAD HEENT: Unremarkable Neck:  6 cm JVD, no thyromegally Lungs:  Clear with no wheezes, rales, or rhonchi HEART:  Regular rate rhythm, no murmurs, no rubs, no clicks Abd:  soft, positive bowel sounds, no organomegally, no rebound, no guarding Ext:  2 plus pulses, no edema, no cyanosis, no clubbing Skin:  No rashes no nodules Neuro:  CN II through XII intact, motor grossly intact   DEVICE  Normal device function except for diaphragmatic stim on LV lead which was left turned off.  See PaceArt for details.   Assess/Plan:

## 2012-10-02 NOTE — Assessment & Plan Note (Signed)
Her ventricular rate is well controlled after AV node ablation. She will continue her current medical therapy.

## 2012-10-02 NOTE — Patient Instructions (Addendum)
Your physician recommends that you schedule a follow-up appointment in: 6 months with GT

## 2012-10-24 ENCOUNTER — Ambulatory Visit (HOSPITAL_COMMUNITY)
Admission: RE | Admit: 2012-10-24 | Discharge: 2012-10-24 | Disposition: A | Discharge status: Home / Self Care | Payer: BC Managed Care – PPO | Source: Ambulatory Visit | Attending: Cardiology | Admitting: Cardiology

## 2012-10-24 ENCOUNTER — Other Ambulatory Visit (HOSPITAL_COMMUNITY): Payer: Self-pay

## 2012-10-24 DIAGNOSIS — I359 Nonrheumatic aortic valve disorder, unspecified: Secondary | ICD-10-CM

## 2012-10-24 DIAGNOSIS — E119 Type 2 diabetes mellitus without complications: Secondary | ICD-10-CM | POA: Insufficient documentation

## 2012-10-24 DIAGNOSIS — J4489 Other specified chronic obstructive pulmonary disease: Secondary | ICD-10-CM | POA: Insufficient documentation

## 2012-10-24 DIAGNOSIS — J449 Chronic obstructive pulmonary disease, unspecified: Secondary | ICD-10-CM | POA: Insufficient documentation

## 2012-10-24 DIAGNOSIS — I10 Essential (primary) hypertension: Secondary | ICD-10-CM | POA: Insufficient documentation

## 2012-10-24 DIAGNOSIS — I4891 Unspecified atrial fibrillation: Secondary | ICD-10-CM | POA: Insufficient documentation

## 2012-10-24 DIAGNOSIS — I428 Other cardiomyopathies: Secondary | ICD-10-CM

## 2012-10-24 NOTE — Progress Notes (Signed)
*  PRELIMINARY RESULTS* Echocardiogram 2D Echocardiogram has been performed.  Conrad Pen Mar 10/24/2012, 10:01 AM

## 2012-10-25 ENCOUNTER — Encounter: Payer: Self-pay | Admitting: Cardiology

## 2012-10-26 ENCOUNTER — Ambulatory Visit (INDEPENDENT_AMBULATORY_CARE_PROVIDER_SITE_OTHER): Payer: BC Managed Care – PPO | Admitting: Gastroenterology

## 2012-10-26 ENCOUNTER — Encounter: Payer: Self-pay | Admitting: Gastroenterology

## 2012-10-26 VITALS — BP 83/54 | HR 100 | Temp 97.3°F | Ht 64.0 in | Wt 122.2 lb

## 2012-10-26 DIAGNOSIS — K625 Hemorrhage of anus and rectum: Secondary | ICD-10-CM | POA: Insufficient documentation

## 2012-10-26 DIAGNOSIS — D649 Anemia, unspecified: Secondary | ICD-10-CM

## 2012-10-26 DIAGNOSIS — K589 Irritable bowel syndrome without diarrhea: Secondary | ICD-10-CM | POA: Insufficient documentation

## 2012-10-26 MED ORDER — DICYCLOMINE HCL 10 MG PO CAPS: ORAL_CAPSULE | ORAL | Status: DC

## 2012-10-26 NOTE — Progress Notes (Signed)
Primary Care Physician:  Fredirick Maudlin, MD  Primary Gastroenterologist:  Jonette Eva, MD   Chief Complaint  Patient presents with  . Irritable Bowel Syndrome    HPI:  [name] [name] is a 57 y.o. female here for further evaluation of GI symptoms. Patient gives h/o IBS. States her  Mother and sister have it as well. C/O PP fecal urgency. Has to stop at the store on the way home to have BM when she eats out. Doesn't occur all the time. BM no more than twice per day. BRBPR small volume on occasion. No nocturnal BMs. Little heartburn. Weight back to baseline since home from hospital in 06/212. She had complicated hospital stay at that time  Secondary to cardiac arrest in setting of hyperkalemia with bradycardia and ventricular tachycardia. She also had VDRL. During that hospitalization she had stable mild anemia without evidence of IDA, B12 or folate deficiency. No prior colonoscopy. No FH of CRC.   Current Outpatient Prescriptions  Medication Sig Dispense Refill  . ALPRAZolam (XANAX) 0.25 MG tablet Take 0.5 mg by mouth at bedtime as needed. For anxiety      . calcium carbonate (TUMS - DOSED IN MG ELEMENTAL CALCIUM) 500 MG chewable tablet Chew 2 tablets by mouth 3 (three) times daily.      . furosemide (LASIX) 40 MG tablet Take 20 mg by mouth daily.      Marland Kitchen lactose free nutrition (BOOST) LIQD Take 237 mLs by mouth daily.      Marland Kitchen lisinopril (PRINIVIL,ZESTRIL) 5 MG tablet Take 1 tablet (5 mg total) by mouth daily.  30 tablet  11  . metoprolol succinate (TOPROL-XL) 50 MG 24 hr tablet Take 1 tablet (50 mg total) by mouth daily. Take with or immediately following a meal.  30 tablet  11  . pantoprazole (PROTONIX) 40 MG tablet Take 1 tablet (40 mg total) by mouth daily.  30 tablet  12  . Rivaroxaban (XARELTO) 20 MG TABS Take 1 tablet (20 mg total) by mouth daily with supper.  30 tablet  11  . sertraline (ZOLOFT) 50 MG tablet Take 50 mg by mouth daily.      Marland Kitchen SPIRIVA HANDIHALER 18 MCG inhalation  capsule Place 18 mcg into inhaler and inhale daily.       Marland Kitchen zolpidem (AMBIEN) 5 MG tablet Take 5 mg by mouth at bedtime as needed. For sleep       No current facility-administered medications for this visit.    Allergies as of 10/26/2012  . (No Known Allergies)    Past Medical History  Diagnosis Date  . COPD (chronic obstructive pulmonary disease)   . Anxiety and depression   . Atrial fibrillation     Echocardiogram in 2004-borderline LV function; no valvular abnormalities; 06/2012: AV nodal ablation + biventricular pacemaker  . Anemia     2011: Hemoglobin of 11.4; hematocrit of 33.5; normal MCV  . Cardiac arrest 06/2012    16 day hospitalization; caused by hyperkalemia; multiple complications including CHF; permanent atrial fibrillation requiring AV nodal ablation + pacing  . Thyroid disease   . Nonischemic cardiomyopathy     Echo 06/2012: EF 30-35%, moderate to severe MR; cardiac cath 06/2012:30-40% D1 and D2, estimated EF-35%, MR-not graded; right bundle branch block    Past Surgical History  Procedure Laterality Date  . Breast biopsy      left, [facility]  . Cervical conization w/bx  08/11/2011    Procedure: CONIZATION CERVIX WITH BIOPSY;  Surgeon: Lazaro Arms,  MD;  Location: [facility];  Service: Gynecology;  Laterality: N/A;  Laser Conization of Cervix  . Breast excisional biopsy      Left x2  . Tee without cardioversion  06/19/2012    Procedure: TRANSESOPHAGEAL ECHOCARDIOGRAM (TEE);  Surgeon: Vesta Mixer, MD;  Location: [facility] ENDOSCOPY;  Service: Cardiovascular;  Laterality: N/A;  . Cardioversion  06/19/2012    Procedure: CARDIOVERSION;  Surgeon: Vesta Mixer, MD;  Location: [facility] ENDOSCOPY;  Service: Cardiovascular;;  . Cardioversion  06/21/2012    Procedure: CARDIOVERSION;  Surgeon: Cassell Clement, MD;  Location: [facility] OR;  Service: Cardiovascular;  Laterality: N/A;    Family History  Problem Relation Age of Onset  . Anesthesia problems Neg Hx   . Hypotension Neg Hx   .  Malignant hyperthermia Neg Hx   . Pseudochol deficiency Neg Hx   . Cancer Father     carcinoma of the lung  . Cancer Sister     carcinoma of the lung    History   Social History  . Marital Status: Divorced    Spouse Name: N/A    Number of Children: 2  . Years of Education: N/A   Occupational History  . Surveyor, quantity  . 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 Smoking status: Never Smoker   . Smokeless tobacco: Never Used  . Alcohol Use: No  . Drug Use: No  . Sexually Active:    Other Topics Concern  . Not on file   Social History Narrative   ** Merged History Encounter **       ** Data from: 06/22/12 Enc Dept: [facility]       ** Data from: 06/05/12 Enc Dept: [facility]   Lives alone with good family support from sisters.             ROS:  General: Negative for anorexia, weight loss, fever, chills.  Fatigue, weakness improving. Eyes: Negative for vision changes.  ENT: Negative for hoarseness, difficulty swallowing , nasal congestion. CV: Negative for chest pain, angina, palpitations, dyspnea on exertion, peripheral edema.  Respiratory: Negative for dyspnea at rest, cough, sputum, wheezing. Some DOE. GI: See history of present illness. GU:  Negative for dysuria, hematuria, urinary incontinence, urinary frequency, nocturnal urination.  MS: Negative for joint pain, low back pain.  Derm: Negative for rash or itching.  Neuro: Negative for weakness, abnormal sensation, seizure, frequent headaches, memory loss, confusion.  Psych: Negative for anxiety, depression, suicidal ideation, hallucinations.  Endo: Negative for unusual weight change.  Heme: Negative for bruising or bleeding. Allergy: Negative for rash or hives.    Physical Examination:  BP 83/54  Pulse 100  Temp(Src) 97.3 F (36.3 C) (Oral)  Ht 5\' 4"  (1.626 m)  Wt 122 lb 3.2 oz (55.43 kg)  BMI 20.97 kg/m2   General: Well-nourished, well-developed in  no acute distress. Accompanied by sister. Difficult historian. Head: Normocephalic, atraumatic.   Eyes: Conjunctiva pink, no icterus. Mouth: Oropharyngeal mucosa moist and pink , no lesions erythema or exudate. Neck: Supple without thyromegaly, masses, or lymphadenopathy.  Lungs: Clear to auscultation bilaterally.  Heart: Regular rate and rhythm, no murmurs rubs or gallops.  Abdomen: Bowel sounds are normal, nontender, nondistended, no hepatosplenomegaly or masses, no abdominal bruits or    hernia , no rebound or guarding.   Rectal: not performed Extremities: No lower extremity edema. No clubbing or deformities.  Neuro: Alert and oriented x  4 , grossly normal neurologically.  Skin: Warm and dry, no rash or jaundice.   Psych: Alert and cooperative, normal mood and affect.  Labs: Lab Results  Component Value Date   WBC 5.3 07/10/2012   HGB 11.3* 07/10/2012   HCT 34.1* 07/10/2012   MCV 95.8 07/10/2012   PLT 236 07/10/2012   Lab Results  Component Value Date   IRON 45 06/04/2012   TIBC 383 06/04/2012   FERRITIN 186 06/04/2012   Lab Results  Component Value Date   VITAMINB12 1424* 06/04/2012   Lab Results  Component Value Date   FOLATE >24.0 06/04/2012    Labs from 09/01/2012, glucose 66, BUN 29, creatinine 0.89, sodium 142, potassium 4.3, calcium 9.9, hemoglobin A1c 5.7.  Imaging Studies: No results found.

## 2012-10-26 NOTE — Patient Instructions (Addendum)
Please collect stool specimen and bring back to our office. Try Bentyl, 1 capsule twice daily before your largest meals. This medication will help reduce the urge to run to the bathroom after meals. Take Restora one capsule daily for three weeks. Samples provided. This will help with digestion. Prescription sent to [facility] in refill. I will touch base with your cardiologist to determine whether or not you can have a colonoscopy at this point.

## 2012-10-27 NOTE — Assessment & Plan Note (Signed)
Normocytic anemia with no evidence of IDA, B12, folate deficiency. We will check ifobt. She does have some intermittent BRBPR. No prior colonoscopy. Would recommend one at some point in the near future but we will need to have approval by her cardiologist as well as find out if she can be off Xarelto for the procedure.   Requested labs from Dr. Juanetta Gosling. No recent CBC done. Will update now. Further recommendations to follow.

## 2012-10-27 NOTE — Assessment & Plan Note (Addendum)
Patient gives h/o IBS. C/O pp urgency. No significant diarrhea. Trial of Restora one daily for three weeks. Samples provided. Trial of Bentyl prn.

## 2012-10-30 ENCOUNTER — Ambulatory Visit (INDEPENDENT_AMBULATORY_CARE_PROVIDER_SITE_OTHER): Payer: BC Managed Care – PPO | Admitting: Gastroenterology

## 2012-10-30 DIAGNOSIS — D649 Anemia, unspecified: Secondary | ICD-10-CM

## 2012-10-30 LAB — BASIC METABOLIC PANEL
BUN: 29 mg/dL — AB (ref 4–21)
Calcium: 9.9 mg/dL
Glucose: 66

## 2012-10-30 NOTE — Progress Notes (Signed)
Pt is aware she needs the CBC and the order was faxed to [facility].

## 2012-10-30 NOTE — Progress Notes (Signed)
CC PCP 

## 2012-10-31 LAB — CBC
HCT: 32.6 % — ABNORMAL LOW (ref 36.0–46.0)
MCHC: 33.1 g/dL (ref 30.0–36.0)
Platelets: 150 10*3/uL (ref 150–400)
RDW: 14.5 % (ref 11.5–15.5)

## 2012-11-01 NOTE — Progress Notes (Signed)
Quick Note:  Doris, Please include in note to cardiologist, request to have cardiology clearance for colonoscopy given h/o cardiac arrest/complicated hospital stay 06/2012 plus if patient can hold Xarelto. ______

## 2012-11-01 NOTE — Progress Notes (Signed)
Quick Note:  H/H stable. ifobt negative. H/O rectal bleeding.  Please contact patient's cardiologist and find out if she can be off Xarelto for colonoscopy. Find out how many days to hold before out of system. ______

## 2012-11-02 ENCOUNTER — Telehealth: Payer: Self-pay

## 2012-11-02 NOTE — Telephone Encounter (Signed)
Tomi . Please ask Dr. Dietrich Pates   Can we get a cardiac clearance for this patient to have a colonoscopy since she has a history of cardiac arrest/ complicated hospital stay in 06/2012.  If so, can we safely hold  Xarelto prior to procedure. ( How many days can it be held before out of system)  Please advise!

## 2012-11-02 NOTE — Telephone Encounter (Signed)
Spoke with Tyler Aas at [facility].  Advised that patient has an appt on April 18 with Dr Dietrich Pates and this will be addressed at that visit and a note sent to them with recommendations.

## 2012-11-02 NOTE — Telephone Encounter (Signed)
Spoke to [place] at [facility] cardiology.Can send message to Dr. Marvel Plan nurse in Epic to inquire about holding Xarelto prior to colonoscopy.

## 2012-11-02 NOTE — Telephone Encounter (Signed)
Please let patient know that she needs to keep OV with Dr. Dietrich Pates on 4/18 so that this issue gets addressed.   We will then f/u to see if we will be able to pursue colonoscopy.

## 2012-11-02 NOTE — Telephone Encounter (Signed)
Routing to Leslie Lewis, PA.  

## 2012-11-02 NOTE — Progress Notes (Signed)
Quick Note:  See separate phone note dated 11/02/2012. ______

## 2012-11-03 NOTE — Progress Notes (Signed)
See cbc result note

## 2012-11-03 NOTE — Telephone Encounter (Signed)
LMOM to call.

## 2012-11-06 NOTE — Telephone Encounter (Signed)
LMOM to call.

## 2012-11-07 NOTE — Telephone Encounter (Signed)
Letter mailed to pt to remind to keep appt with Dr. Dietrich Pates, then we will follow up with his recommendations.

## 2012-11-17 ENCOUNTER — Encounter: Payer: Self-pay | Admitting: Cardiology

## 2012-11-17 ENCOUNTER — Ambulatory Visit (INDEPENDENT_AMBULATORY_CARE_PROVIDER_SITE_OTHER): Payer: BC Managed Care – PPO | Admitting: Cardiology

## 2012-11-17 VITALS — BP 116/68 | HR 77 | Ht 64.0 in | Wt 125.0 lb

## 2012-11-17 DIAGNOSIS — D649 Anemia, unspecified: Secondary | ICD-10-CM

## 2012-11-17 DIAGNOSIS — I5022 Chronic systolic (congestive) heart failure: Secondary | ICD-10-CM

## 2012-11-17 DIAGNOSIS — I428 Other cardiomyopathies: Secondary | ICD-10-CM

## 2012-11-17 DIAGNOSIS — I442 Atrioventricular block, complete: Secondary | ICD-10-CM

## 2012-11-17 DIAGNOSIS — I4891 Unspecified atrial fibrillation: Secondary | ICD-10-CM

## 2012-11-17 DIAGNOSIS — I10 Essential (primary) hypertension: Secondary | ICD-10-CM

## 2012-11-17 DIAGNOSIS — Z950 Presence of cardiac pacemaker: Secondary | ICD-10-CM

## 2012-11-17 MED ORDER — LISINOPRIL 10 MG PO TABS: 10.0000 mg | ORAL_TABLET | Freq: Every day | ORAL | Status: DC

## 2012-11-17 NOTE — Progress Notes (Deleted)
Name: [name] [name]    DOB: 17-Oct-1955  Age: 57 y.o.  MR#: [id number]       PCP:  Fredirick Maudlin, MD      Insurance: Payor: BLUE CROSS BLUE SHIELD  Plan: BCBS PPO OUT OF STATE  Product Type: *No Product type*    CC:   No chief complaint on file. medication list  VS Filed Vitals:   11/17/12 1425  BP: 116/68  Pulse: 77  Height: 5\' 4"  (1.626 m)  Weight: 125 lb (56.7 kg)  SpO2: 100%    Weights Current Weight  11/17/12 125 lb (56.7 kg)  10/26/12 122 lb 3.2 oz (55.43 kg)  10/02/12 119 lb (53.978 kg)    Blood Pressure  BP Readings from Last 3 Encounters:  11/17/12 116/68  10/26/12 83/54  10/02/12 110/68     Admit date:  (Not on file) Last encounter with RMR:  08/14/2012   Allergy Review of patient's allergies indicates no known allergies.  Current Outpatient Prescriptions  Medication Sig Dispense Refill  . ALPRAZolam (XANAX) 0.25 MG tablet Take 0.5 mg by mouth at bedtime as needed. For anxiety      . calcium carbonate (TUMS - DOSED IN MG ELEMENTAL CALCIUM) 500 MG chewable tablet Chew 2 tablets by mouth 3 (three) times daily.      Marland Kitchen dicyclomine (BENTYL) 10 MG capsule Take one capsule twice a day before largest meals as needed for diarrhea.  60 capsule  0  . furosemide (LASIX) 40 MG tablet Take 20 mg by mouth daily.      Marland Kitchen lactose free nutrition (BOOST) LIQD Take 237 mLs by mouth daily.      Marland Kitchen lisinopril (PRINIVIL,ZESTRIL) 5 MG tablet Take 1 tablet (5 mg total) by mouth daily.  30 tablet  11  . metoprolol succinate (TOPROL-XL) 50 MG 24 hr tablet Take 1 tablet (50 mg total) by mouth daily. Take with or immediately following a meal.  30 tablet  11  . pantoprazole (PROTONIX) 40 MG tablet Take 1 tablet (40 mg total) by mouth daily.  30 tablet  12  . Probiotic Product (RESTORA) CAPS Take 1 capsule by mouth daily.      . Rivaroxaban (XARELTO) 20 MG TABS Take 1 tablet (20 mg total) by mouth daily with supper.  30 tablet  11  . sertraline (ZOLOFT) 50 MG tablet Take 50 mg by mouth daily.       Marland Kitchen SPIRIVA HANDIHALER 18 MCG inhalation capsule Place 18 mcg into inhaler and inhale daily.       Marland Kitchen zolpidem (AMBIEN) 5 MG tablet Take 5 mg by mouth at bedtime as needed. For sleep       No current facility-administered medications for this visit.    Discontinued Meds:   There are no discontinued medications.  Patient Active Problem List  Diagnosis  . Atrial fibrillation with RVR  . Anxiety and depression  . Anemia, normocytic normochromic  . Hypertension  . Complete heart block--s/p AV ablation  . COPD (chronic obstructive pulmonary disease)  . Diabetes mellitus, type 2  . Cardiac arrest  . Nonischemic cardiomyopathy  . Chronic systolic heart failure  . Pacemaker  . Irritable bowel syndrome  . Anemia, unspecified  . Rectal bleeding    LABS    Component Value Date/Time   NA 142 09/04/2012   NA 139 07/10/2012 1401   NA 136 06/28/2012 0515   NA 136 06/27/2012 0525   K 4.3 09/04/2012   K 4.9 07/10/2012 1401  K 4.0 06/28/2012 0515   K 3.9 06/27/2012 0525   CL 103 07/10/2012 1401   CL 98 06/28/2012 0515   CL 98 06/27/2012 0525   CO2 27 07/10/2012 1401   CO2 30 06/28/2012 0515   CO2 29 06/27/2012 0525   GLUCOSE 74 07/10/2012 1401   GLUCOSE 93 06/28/2012 0515   GLUCOSE 93 06/27/2012 0525   BUN 29* 09/04/2012   BUN 26* 07/10/2012 1401   BUN 26* 06/28/2012 0515   BUN 25* 06/27/2012 0525   CREATININE 0.9 09/04/2012   CREATININE 0.99 07/10/2012 1401   CREATININE 0.93 06/28/2012 0515   CREATININE 0.81 06/27/2012 0525   CREATININE 0.87 06/26/2012 0500   CALCIUM 9.9 09/04/2012   CALCIUM 9.4 07/10/2012 1401   CALCIUM 9.4 06/28/2012 0515   CALCIUM 9.5 06/27/2012 0525   GFRNONAA 67* 06/28/2012 0515   GFRNONAA 79* 06/27/2012 0525   GFRNONAA 73* 06/26/2012 0500   GFRAA 78* 06/28/2012 0515   GFRAA >90 06/27/2012 0525   GFRAA 84* 06/26/2012 0500   CMP     Component Value Date/Time   NA 142 09/04/2012   NA 139 07/10/2012 1401   K 4.3 09/04/2012   K 4.9 07/10/2012 1401   CL 103  07/10/2012 1401   CO2 27 07/10/2012 1401   GLUCOSE 74 07/10/2012 1401   BUN 29* 09/04/2012   BUN 26* 07/10/2012 1401   CREATININE 0.9 09/04/2012   CREATININE 0.99 07/10/2012 1401   CREATININE 0.93 06/28/2012 0515   CALCIUM 9.9 09/04/2012   CALCIUM 9.4 07/10/2012 1401   PROT 6.2 06/21/2012 0525   ALBUMIN 2.7* 06/21/2012 0525   AST 22 06/21/2012 0525   ALT 34 06/21/2012 0525   ALKPHOS 73 06/21/2012 0525   BILITOT 0.4 06/21/2012 0525   GFRNONAA 67* 06/28/2012 0515   GFRAA 78* 06/28/2012 0515       Component Value Date/Time   WBC 4.2 10/30/2012 1430   WBC 5.3 07/10/2012 1401   WBC 5.1 06/28/2012 0515   HGB 10.8* 10/30/2012 1430   HGB 11.3* 07/10/2012 1401   HGB 10.7* 06/28/2012 0515   HCT 32.6* 10/30/2012 1430   HCT 34.1* 07/10/2012 1401   HCT 31.6* 06/28/2012 0515   MCV 93.7 10/30/2012 1430   MCV 95.8 07/10/2012 1401   MCV 96.0 06/28/2012 0515    Lipid Panel     Component Value Date/Time   CHOL 105 06/13/2012 0848   TRIG 114 06/21/2012 0525   HDL 41 06/13/2012 0848   CHOLHDL 2.6 06/13/2012 0848   VLDL 18 06/13/2012 0848   LDLCALC 46 06/13/2012 0848    ABG    Component Value Date/Time   PHART 7.507* 06/14/2012 0439   PCO2ART 40.9 06/14/2012 0439   PO2ART 78.3* 06/14/2012 0439   HCO3 32.2* 06/14/2012 0439   TCO2 33.4 06/14/2012 0439   ACIDBASEDEF 0.3 06/05/2012 0515   O2SAT 97.1 06/14/2012 0439     Lab Results  Component Value Date   TSH 0.537 06/12/2012   BNP (last 3 results)  Recent Labs  06/06/12 1722 06/12/12 0226 06/12/12 2110  PROBNP 7326.0* 3356.0* 4270.0*   Cardiac Panel (last 3 results) No results found for this basename: CKTOTAL, CKMB, TROPONINI, RELINDX,  in the last 72 hours  Iron/TIBC/Ferritin    Component Value Date/Time   IRON 45 06/04/2012 0830   TIBC 383 06/04/2012 0830   FERRITIN 186 06/04/2012 0830     EKG Orders placed in visit on 10/02/12  . EKG 12-LEAD     Prior Assessment  and Plan Problem List as of 11/17/2012     ICD-9-CM      Cardiology Problems   Atrial fibrillation with RVR   Last Assessment & Plan   10/02/2012 Office Visit Written 10/02/2012  9:20 AM by Marinus Maw, MD     Her ventricular rate is well controlled after AV node ablation. She will continue her current medical therapy.    Hypertension   Last Assessment & Plan   02/15/2012 Office Visit Written 02/15/2012  5:04 PM by Jodelle Gross, NP     Currently well controlled on mediations. No changes at this time.    Complete heart block--s/p AV ablation   Cardiac arrest   Last Assessment & Plan   08/14/2012 Office Visit Written 08/14/2012  1:01 PM by Kathlen Brunswick, MD     Although she presented with substantial left ventricular dysfunction and subsequently suffered a cardiac arrest, there is no evidence that she has primary ventricular tachycardia or ventricular fibrillation. Accordingly, implantation of an AICD is not necessarily warranted.    Nonischemic cardiomyopathy   Last Assessment & Plan   08/14/2012 Office Visit Edited 08/22/2012 10:27 PM by Kathlen Brunswick, MD     There is a reasonable likelihood that cardiomyopathy was caused by tachycardia or represents a stress-induced episode of Takotsubo's. A repeat echocardiogram will be performed in 2 months to determine if LV systolic function recovers.  Mitral regurgitation was graded as moderate to severe on her most recent echocardiogram. No murmur is heard at this visit, which may indicate improvement in MR and thus in the underlying cardiomyopathy. This will be reassessed on her repeat echocardiogram    Chronic systolic heart failure   Last Assessment & Plan   10/02/2012 Office Visit Written 10/02/2012  9:22 AM by Marinus Maw, MD     Her symptoms are currently class 2B. She will continue her current medical therapy and I have discussed the importance of exercise and a low sodium diet.  I will see her back in 6 months. I have asked that she call sooner if her heart failure worsens and I would refer  for an epicardial lead in LV.      Other   Anxiety and depression   Anemia, normocytic normochromic   Last Assessment & Plan   08/14/2012 Office Visit Written 08/14/2012  1:00 PM by Kathlen Brunswick, MD     Patient reports recent laboratory testing with Dr. Juanetta Gosling. We will determine whether CBC has been repeated.    COPD (chronic obstructive pulmonary disease)   Diabetes mellitus, type 2   Pacemaker   Last Assessment & Plan   10/02/2012 Office Visit Written 10/02/2012  9:23 AM by Marinus Maw, MD     Her device is working normally but LV lead has diaphragmatic stim even at low outputs. Will follow.    Irritable bowel syndrome   Last Assessment & Plan   10/26/2012 Office Visit Edited 10/27/2012  4:33 PM by Tiffany Kocher, PA-C     Patient gives h/o IBS. C/O pp urgency. No significant diarrhea. Trial of Restora one daily for three weeks. Samples provided. Trial of Bentyl prn.     Anemia, unspecified   Last Assessment & Plan   10/26/2012 Office Visit Written 10/27/2012  4:35 PM by Tiffany Kocher, PA-C     Normocytic anemia with no evidence of IDA, B12, folate deficiency. We will check ifobt. She does have some intermittent BRBPR. No prior colonoscopy. Would recommend  one at some point in the near future but we will need to have approval by her cardiologist as well as find out if she can be off Xarelto for the procedure.   Requested labs from Dr. Juanetta Gosling. No recent CBC done. Will update now. Further recommendations to follow.     Rectal bleeding       Imaging: No results found.

## 2012-11-17 NOTE — Assessment & Plan Note (Addendum)
No clinical congestive heart failure at present. Dose of lisinopril will be increased for increased efficacy. Patient advised as to the signs and symptoms of cerebral hypotension and will call should these develop.  Most recent EF estimated at 25%. EP should evaluate the advisability of upgrading device to AICD capability.  Nutritional  status improved-dietary supplement can be discontinued.

## 2012-11-17 NOTE — Patient Instructions (Addendum)
Your physician has recommended you make the following change in your medication: Increase Lisinopril to 10 mg daily  Stop drinking the Boost supplement  You will need to have lab work at the [facility] lab ( across the street)  In 2 months  CMP & BNP levels  Your physician recommends that you schedule a follow-up appointment in: 6 months

## 2012-11-17 NOTE — Assessment & Plan Note (Signed)
Anemia has improved relative to her recent hospitalization with hemoglobin and hematocrit at a level similar to 2011.

## 2012-11-17 NOTE — Progress Notes (Signed)
Patient ID: [name] [name], female   DOB: 06/01/1956, 57 y.o.   MRN: [id number]  HPI: Schedule return visit for this nice young woman who continues to recover from a difficult hospitalization following cardiac arrest related to hyperkalemia. Over the past few weeks, appetite has returned and energy increased. Weight has been stable and at least as high as her body weight prior to her cardiac arrest. She is participating in light exercise at a local community center  Current Outpatient Prescriptions  Medication Sig Dispense Refill  . ALPRAZolam (XANAX) 0.25 MG tablet Take 0.5 mg by mouth at bedtime as needed. For anxiety      . calcium carbonate (TUMS - DOSED IN MG ELEMENTAL CALCIUM) 500 MG chewable tablet Chew 2 tablets by mouth 3 (three) times daily.      Marland Kitchen dicyclomine (BENTYL) 10 MG capsule Take one capsule twice a day before largest meals as needed for diarrhea.  60 capsule  0  . furosemide (LASIX) 40 MG tablet Take 20 mg by mouth daily.      Marland Kitchen lactose free nutrition (BOOST) LIQD Take 237 mLs by mouth daily.      Marland Kitchen lisinopril (PRINIVIL,ZESTRIL) 10 MG tablet Take 1 tablet (10 mg total) by mouth daily.  30 tablet  11  . metoprolol succinate (TOPROL-XL) 50 MG 24 hr tablet Take 1 tablet (50 mg total) by mouth daily. Take with or immediately following a meal.  30 tablet  11  . pantoprazole (PROTONIX) 40 MG tablet Take 1 tablet (40 mg total) by mouth daily.  30 tablet  12  . Probiotic Product (RESTORA) CAPS Take 1 capsule by mouth daily.      . Rivaroxaban (XARELTO) 20 MG TABS Take 1 tablet (20 mg total) by mouth daily with supper.  30 tablet  11  . sertraline (ZOLOFT) 50 MG tablet Take 50 mg by mouth daily.      Marland Kitchen SPIRIVA HANDIHALER 18 MCG inhalation capsule Place 18 mcg into inhaler and inhale daily.       Marland Kitchen zolpidem (AMBIEN) 5 MG tablet Take 5 mg by mouth at bedtime as needed. For sleep       No current facility-administered medications for this visit.   No Known Allergies   Past medical  history, social history, and family history reviewed and updated.  ROS: Denies chest pain, dyspnea, palpitations, lightheadedness or syncope. All other systems reviewed and are negative.  PHYSICAL EXAM: BP 116/68  Pulse 77  Ht 5\' 4"  (1.626 m)  Wt 56.7 kg (125 lb)  BMI 21.45 kg/m2  SpO2 100%;  Body mass index is 21.45 kg/(m^2). General-Well developed; no acute distress; wan complexion Body habitus-thin Neck-No JVD; no carotid bruits Lungs-clear lung fields; resonant to percussion Cardiovascular-normal PMI; normal S1 and S2; prominent S4; modest early systolic ejection murmur Abdomen-normal bowel sounds; soft and non-tender without masses or organomegaly Musculoskeletal-No deformities, no cyanosis or clubbing Neurologic-Normal cranial nerves; symmetric strength and tone Skin-Warm, no significant lesions Extremities-distal pulses intact; no edema  Sylvania Bing, MD 11/17/2012  5:26 PM  ASSESSMENT AND PLAN

## 2012-11-17 NOTE — Assessment & Plan Note (Signed)
No elevated blood pressures detected over at least the past 6 months.

## 2012-11-20 NOTE — Progress Notes (Addendum)
MESSAGE SENT FROM DR. ROTH BART-"[name] [name] was seen in the office yesterday and is much improved. She mentioned that you are considering endoscopic procedures. He is to have recovered adequately to permit safe upper and lower endoscopy. Rivaroxaban can be held for 2 days prior and resume immediately afterwards if no intervention performed and 24-48 hours later if biopsy or polypectomy needed. "  REVIEWED.

## 2012-11-21 ENCOUNTER — Telehealth: Payer: Self-pay

## 2012-11-21 ENCOUNTER — Other Ambulatory Visit: Payer: Self-pay

## 2012-11-21 DIAGNOSIS — D649 Anemia, unspecified: Secondary | ICD-10-CM

## 2012-11-21 DIAGNOSIS — K625 Hemorrhage of anus and rectum: Secondary | ICD-10-CM

## 2012-11-21 DIAGNOSIS — K219 Gastro-esophageal reflux disease without esophagitis: Secondary | ICD-10-CM

## 2012-11-21 MED ORDER — PEG-KCL-NACL-NASULF-NA ASC-C 100 G PO SOLR: 1.0000 | ORAL | Status: DC

## 2012-11-21 NOTE — Telephone Encounter (Signed)
Medicines reviewed. Rx sent to [facility] and instructions mailed to pt.

## 2012-11-21 NOTE — Progress Notes (Signed)
Leigh Ann Left message on machine to call.

## 2012-11-21 NOTE — Progress Notes (Addendum)
Discussed with Dr. Darrick Penna.  Please go ahead and schedule TCS/EGD with ?SB bx with Dr. Darrick Penna for change in bowels, rectal bleeding, GERD, normocytic anemia.   Hold Xarelto 2 days prior to procedures.   Please update her medications as it has been greater than 30 days.

## 2012-11-21 NOTE — Progress Notes (Signed)
Pt is scheduled for 12/08/2012 at 1:00 PM. Instructions have been mailed and Rx sent to the pharmacy.

## 2012-11-22 ENCOUNTER — Telehealth: Payer: Self-pay | Admitting: *Deleted

## 2012-11-22 NOTE — Telephone Encounter (Signed)
noted 

## 2012-11-22 NOTE — Telephone Encounter (Signed)
Noted pt came into office for OV and requested update on her pt assistance program for effient, called company and was advised that she does not qualify per pt insurance noted as medicaid, pt given samples, KL made aware of pt inability to afford medication, will adjust at upcoming OV if needed

## 2012-11-23 ENCOUNTER — Telehealth: Payer: Self-pay

## 2012-11-23 NOTE — Telephone Encounter (Signed)
Pt requested samples of Restora. Per Tana Coast, PA ok. #30 at front for pick up and pt was aware they would be left at the front.

## 2012-11-24 ENCOUNTER — Encounter (HOSPITAL_COMMUNITY): Payer: Self-pay | Admitting: Pharmacy Technician

## 2012-11-28 ENCOUNTER — Encounter: Payer: Self-pay | Admitting: Cardiology

## 2012-12-01 ENCOUNTER — Other Ambulatory Visit: Payer: Self-pay | Admitting: Cardiology

## 2012-12-01 ENCOUNTER — Other Ambulatory Visit: Payer: Self-pay | Admitting: Obstetrics & Gynecology

## 2012-12-01 DIAGNOSIS — Z139 Encounter for screening, unspecified: Secondary | ICD-10-CM

## 2012-12-02 LAB — COMPREHENSIVE METABOLIC PANEL
ALT: 14 U/L (ref 0–35)
AST: 21 U/L (ref 0–37)
Albumin: 4.6 g/dL (ref 3.5–5.2)
Alkaline Phosphatase: 71 U/L (ref 39–117)
BUN: 31 mg/dL — ABNORMAL HIGH (ref 6–23)
CO2: 29 mEq/L (ref 19–32)
Calcium: 10.3 mg/dL (ref 8.4–10.5)
Chloride: 99 mEq/L (ref 96–112)
Creat: 1.17 mg/dL — ABNORMAL HIGH (ref 0.50–1.10)
Glucose, Bld: 84 mg/dL (ref 70–99)
Potassium: 5 mEq/L (ref 3.5–5.3)
Sodium: 137 mEq/L (ref 135–145)
Total Bilirubin: 0.6 mg/dL (ref 0.3–1.2)
Total Protein: 7.2 g/dL (ref 6.0–8.3)

## 2012-12-03 ENCOUNTER — Encounter: Payer: Self-pay | Admitting: Cardiology

## 2012-12-04 ENCOUNTER — Encounter: Payer: Self-pay | Admitting: *Deleted

## 2012-12-04 NOTE — Assessment & Plan Note (Addendum)
Rx-rivaroxaban to prevent thromboembolism.  No history of embolic event, but gender, LV dysfunction, h/o CHF, and hypertension convey significant risk.  EGD/colonoscopy is planned with a request from Dr. Darrick Penna for a recommendation regarding anticoagulation.  Rivaroxaban can be discontinued 48 hours prior to procedure and resumed immediately afterward if no biopsy or other intervention, 24-36 hrs later if more than diagnostic procedure required.

## 2012-12-05 ENCOUNTER — Telehealth: Payer: Self-pay | Admitting: Internal Medicine

## 2012-12-05 ENCOUNTER — Telehealth: Payer: Self-pay | Admitting: Gastroenterology

## 2012-12-05 NOTE — Telephone Encounter (Signed)
Pt received monitor in mail, she does not have a home phone and wants to know if she need to use her sisters phone to transmit

## 2012-12-05 NOTE — Telephone Encounter (Signed)
Patient came by window asking if we had any Restora samples. I told her that we were out at the moment, but if we get any in that someone would call her. [phone]

## 2012-12-06 NOTE — Telephone Encounter (Signed)
Spoke with pt and instructed that next appointment was in November with GT in [facility] office. Pt can decide at that time if wants to send transmissions or be seen in office. Pt understands and will decide if will get home phone or use sisters phone to transmit.

## 2012-12-27 NOTE — Telephone Encounter (Signed)
We have not had Restora samples in a very long time. She is on list if we do get any.

## 2012-12-28 ENCOUNTER — Telehealth: Payer: Self-pay

## 2012-12-28 NOTE — Telephone Encounter (Signed)
REVIEWED.  

## 2012-12-28 NOTE — Telephone Encounter (Signed)
Called pt to update triage, she was scheduled for colonoscopy on 01/02/2013. Pt said she will not be able to do it at this time. She is waiting on her disability and social security. Said she will cancel now and call back in a few months. I called Kim and informed her.

## 2012-12-29 ENCOUNTER — Ambulatory Visit (HOSPITAL_COMMUNITY): Payer: Self-pay

## 2013-01-02 ENCOUNTER — Ambulatory Visit (HOSPITAL_COMMUNITY)
Admission: RE | Admit: 2013-01-02 | Payer: BC Managed Care – PPO | Source: Ambulatory Visit | Admitting: Gastroenterology

## 2013-01-02 ENCOUNTER — Encounter (HOSPITAL_COMMUNITY): Admission: RE | Payer: Self-pay | Source: Ambulatory Visit

## 2013-01-02 SURGERY — COLONOSCOPY
Anesthesia: Moderate Sedation

## 2013-01-12 ENCOUNTER — Ambulatory Visit (HOSPITAL_COMMUNITY): Payer: Self-pay

## 2013-01-20 NOTE — Progress Notes (Signed)
REVIEWED.  JUN 2014: Called pt to update triage, she was scheduled for colonoscopy on 01/02/2013. Pt said she will not be able to do it at this time. She is waiting on her disability and social security. Said she will cancel now and call back in a few months.

## 2013-02-16 ENCOUNTER — Ambulatory Visit (HOSPITAL_COMMUNITY)
Admission: RE | Admit: 2013-02-16 | Discharge: 2013-02-16 | Disposition: A | Discharge status: Home / Self Care | Payer: BC Managed Care – PPO | Source: Ambulatory Visit | Attending: Obstetrics & Gynecology | Admitting: Obstetrics & Gynecology

## 2013-02-16 DIAGNOSIS — Z139 Encounter for screening, unspecified: Secondary | ICD-10-CM

## 2013-02-16 DIAGNOSIS — Z1231 Encounter for screening mammogram for malignant neoplasm of breast: Secondary | ICD-10-CM | POA: Insufficient documentation

## 2013-04-30 ENCOUNTER — Ambulatory Visit (INDEPENDENT_AMBULATORY_CARE_PROVIDER_SITE_OTHER): Payer: BC Managed Care – PPO | Admitting: Internal Medicine

## 2013-04-30 ENCOUNTER — Encounter: Payer: Self-pay | Admitting: Internal Medicine

## 2013-04-30 VITALS — BP 114/70 | HR 81 | Ht 65.0 in | Wt 128.0 lb

## 2013-04-30 DIAGNOSIS — I4891 Unspecified atrial fibrillation: Secondary | ICD-10-CM

## 2013-04-30 DIAGNOSIS — Z950 Presence of cardiac pacemaker: Secondary | ICD-10-CM

## 2013-04-30 DIAGNOSIS — I428 Other cardiomyopathies: Secondary | ICD-10-CM

## 2013-04-30 DIAGNOSIS — I5022 Chronic systolic (congestive) heart failure: Secondary | ICD-10-CM | POA: Insufficient documentation

## 2013-04-30 LAB — PACEMAKER DEVICE OBSERVATION
AL IMPEDENCE PM: 494 Ohm
ATRIAL PACING PM: 0
BATTERY VOLTAGE: 3.0076 V
BMOD-0003RV: 30
RV LEAD IMPEDENCE PM: 608 Ohm
RV LEAD THRESHOLD: 0.5 V

## 2013-04-30 NOTE — Patient Instructions (Addendum)
Your physician recommends that you schedule a follow-up appointment in: 6 MONTHS WITH PAULA/YEAR WITH GT

## 2013-04-30 NOTE — Progress Notes (Signed)
HPI [name] returns today for followup. She is a pleasant 57 yo woman with a non-ischemic CM, EF 30-35%, chronic atrial fib with an RVR, s/p AV node ablation and PPM insertion. At the time of her PPM insertion, she had diaphragmatic stimulation and her LV lead was turned off. In the interim, she has done well. She is very active and denies periphera edema, cough, sob or chest pain. She has been gaining weight and is eating well. No Known Allergies   Current Outpatient Prescriptions  Medication Sig Dispense Refill  . ALPRAZolam (XANAX) 0.25 MG tablet Take 0.5 mg by mouth at bedtime as needed. For anxiety      . calcium carbonate (TUMS - DOSED IN MG ELEMENTAL CALCIUM) 500 MG chewable tablet Chew 2 tablets by mouth 3 (three) times daily.      Marland Kitchen dicyclomine (BENTYL) 10 MG capsule Take one capsule twice a day before largest meals as needed for diarrhea.  60 capsule  0  . furosemide (LASIX) 40 MG tablet Take 20 mg by mouth daily.      Marland Kitchen lactose free nutrition (BOOST) LIQD Take 237 mLs by mouth daily.      Marland Kitchen lisinopril (PRINIVIL,ZESTRIL) 10 MG tablet Take 1 tablet (10 mg total) by mouth daily.  30 tablet  11  . metoprolol succinate (TOPROL-XL) 50 MG 24 hr tablet Take 1 tablet (50 mg total) by mouth daily. Take with or immediately following a meal.  30 tablet  11  . pantoprazole (PROTONIX) 40 MG tablet Take 1 tablet (40 mg total) by mouth daily.  30 tablet  12  . peg 3350 powder (MOVIPREP) 100 G SOLR Take 1 kit (100 g total) by mouth as directed.  1 kit  0  . Probiotic Product (RESTORA) CAPS Take 1 capsule by mouth daily.      . Rivaroxaban (XARELTO) 20 MG TABS Take 1 tablet (20 mg total) by mouth daily with supper.  30 tablet  11  . sertraline (ZOLOFT) 50 MG tablet Take 50 mg by mouth daily.      Marland Kitchen SPIRIVA HANDIHALER 18 MCG inhalation capsule Place 18 mcg into inhaler and inhale daily.       Marland Kitchen zolpidem (AMBIEN) 5 MG tablet Take 5 mg by mouth at bedtime as needed. For sleep       No current  facility-administered medications for this visit.     Past Medical History  Diagnosis Date  . COPD (chronic obstructive pulmonary disease)   . Anxiety and depression   . Atrial fibrillation     Echocardiogram in 2004-borderline LV function; no valvular abnormalities; 06/2012: AV nodal ablation + biventricular pacemaker  . Anemia     2011: Hemoglobin of 11.4; hematocrit of 33.5; normal MCV  . Cardiac arrest 06/2012    16 day hospitalization; caused by hyperkalemia; multiple complications including CHF; permanent atrial fibrillation requiring AV nodal ablation + pacing  . Thyroid disease   . Nonischemic cardiomyopathy     Echo 06/2012: EF 30-35%, moderate to severe MR; cardiac cath 06/2012:30-40% D1 and D2, estimated EF-35%, MR-not graded; right bundle branch block    ROS:   All systems reviewed and negative except as noted in the HPI.   Past Surgical History  Procedure Laterality Date  . Breast biopsy      left, [facility]  . Cervical conization w/bx  08/11/2011    Procedure: CONIZATION CERVIX WITH BIOPSY;  Surgeon: Lazaro Arms, MD;  Location: [facility];  Service: Gynecology;  Laterality:  N/A;  Laser Conization of Cervix  . Breast excisional biopsy      Left x2  . Tee without cardioversion  06/19/2012    Procedure: TRANSESOPHAGEAL ECHOCARDIOGRAM (TEE);  Surgeon: Vesta Mixer, MD;  Location: [facility] ENDOSCOPY;  Service: Cardiovascular;  Laterality: N/A;  . Cardioversion  06/19/2012    Procedure: CARDIOVERSION;  Surgeon: Vesta Mixer, MD;  Location: [facility] ENDOSCOPY;  Service: Cardiovascular;;  . Cardioversion  06/21/2012    Procedure: CARDIOVERSION;  Surgeon: Cassell Clement, MD;  Location: [facility] OR;  Service: Cardiovascular;  Laterality: N/A;     Family History  Problem Relation Age of Onset  . Anesthesia problems Neg Hx   . Hypotension Neg Hx   . Malignant hyperthermia Neg Hx   . Pseudochol deficiency Neg Hx   . Cancer Father     carcinoma of the lung  . Cancer Sister     carcinoma  of the lung  . Colon cancer Neg Hx   . Cervical cancer Mother   . Irritable bowel syndrome      mother, sister  . Celiac disease Neg Hx   . Inflammatory bowel disease Neg Hx      History   Social History  . Marital Status: Divorced    Spouse Name: N/A    Number of Children: 2  . Years of Education: N/A   Occupational History  . Surveyor, quantity  . 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 Smoking status: Never Smoker   . Smokeless tobacco: Never Used  . Alcohol Use: No  . Drug Use: No  . Sexual Activity:    Other Topics Concern  . Not on file   Social History Narrative   ** Merged History Encounter **       ** Data from: 06/22/12 Enc Dept: [facility]       ** Data from: 06/05/12 Enc Dept: [facility]   Lives alone with good family support from sisters.            BP 114/70  Pulse 81  Ht 5\' 5"  (1.651 m)  Wt 128 lb (58.06 kg)  BMI 21.3 kg/m2  Physical Exam:  Well appearing middle aged woman, NAD HEENT: Unremarkable Neck:  7 cm JVD, no thyromegally Back:  No CVA tenderness Lungs:  Clear with no wheezes, rales, or rhonchi. Well healed PPM insertion. HEART:  Regular rate rhythm, no murmurs, no rubs, no clicks Abd:  soft, positive bowel sound,s, no organomegally, no rebound, no guarding Ext:  2 plus pulses, no edema, no cyanosis, no clubbing Skin:  No rashes no nodules Neuro:  CN II through XII intact, motor grossly intact  DEVICE  Normal device function.  See PaceArt for details.   Assess/Plan:

## 2013-04-30 NOTE — Assessment & Plan Note (Signed)
Her ventricular rate is well controlled. No change in medical therapy. 

## 2013-04-30 NOTE — Assessment & Plan Note (Signed)
Her device is working normally. Her LV lead remains turned off.

## 2013-04-30 NOTE — Assessment & Plan Note (Signed)
Her symptoms are class 2A. She will continue her current meds and I have asked her to maintain a low sodium diet.

## 2013-05-17 ENCOUNTER — Encounter: Payer: Self-pay | Admitting: Cardiology

## 2013-05-17 ENCOUNTER — Ambulatory Visit (INDEPENDENT_AMBULATORY_CARE_PROVIDER_SITE_OTHER): Payer: BC Managed Care – PPO | Admitting: Cardiology

## 2013-05-17 VITALS — BP 124/80 | HR 86 | Ht 64.0 in | Wt 127.0 lb

## 2013-05-17 DIAGNOSIS — I428 Other cardiomyopathies: Secondary | ICD-10-CM

## 2013-05-17 MED ORDER — METOPROLOL SUCCINATE ER 100 MG PO TB24: 100.0000 mg | ORAL_TABLET | Freq: Every day | ORAL | Status: DC

## 2013-05-17 NOTE — Patient Instructions (Addendum)
Your physician recommends that you schedule a follow-up appointment in: ONE MONTH  Your physician has recommended you make the following change in your medication:   1) INCREASE YOUR TOPROL XL TO 100MG  ONCE DAILY

## 2013-05-17 NOTE — Progress Notes (Signed)
Clinical Summary [name] [name] is a 57 y.o.female seen today for follow up  1. NICM - LVEF 30-35% - cath Jan 2011 showed no obstructive CAD - denies any significant SOB, no chest pain, no orthopnea, no LE edema.  - limiting sodium intake, takes NSAIDs rarely - compliant with meds - walks 30 min daily without troubles.  2. Chronic afib - prior AV nodal ablation with permanent pacemaker - biventricular pacer, however LV lead off due to diaphgramatic stimulation - normal device check at her EP visit 04/30/13  - no palpitations, no lightheadedness or dizziness. No bleeding troubles on rivaroxaban  3. Moderate to severe MR - no symptoms as described above   Past Medical History  Diagnosis Date  . COPD (chronic obstructive pulmonary disease)   . Anxiety and depression   . Atrial fibrillation     Echocardiogram in 2004-borderline LV function; no valvular abnormalities; 06/2012: AV nodal ablation + biventricular pacemaker  . Anemia     2011: Hemoglobin of 11.4; hematocrit of 33.5; normal MCV  . Cardiac arrest 06/2012    16 day hospitalization; caused by hyperkalemia; multiple complications including CHF; permanent atrial fibrillation requiring AV nodal ablation + pacing  . Thyroid disease   . Nonischemic cardiomyopathy     Echo 06/2012: EF 30-35%, moderate to severe MR; cardiac cath 06/2012:30-40% D1 and D2, estimated EF-35%, MR-not graded; right bundle Tiney Zipper block     No Known Allergies   Current Outpatient Prescriptions  Medication Sig Dispense Refill  . ALPRAZolam (XANAX) 0.25 MG tablet Take 0.5 mg by mouth at bedtime as needed. For anxiety      . calcium carbonate (TUMS - DOSED IN MG ELEMENTAL CALCIUM) 500 MG chewable tablet Chew 2 tablets by mouth 3 (three) times daily.      Marland Kitchen dicyclomine (BENTYL) 10 MG capsule Take one capsule twice a day before largest meals as needed for diarrhea.  60 capsule  0  . furosemide (LASIX) 40 MG tablet Take 20 mg by mouth daily.      Marland Kitchen  lactose free nutrition (BOOST) LIQD Take 237 mLs by mouth daily.      Marland Kitchen lisinopril (PRINIVIL,ZESTRIL) 10 MG tablet Take 1 tablet (10 mg total) by mouth daily.  30 tablet  11  . metoprolol succinate (TOPROL-XL) 50 MG 24 hr tablet Take 1 tablet (50 mg total) by mouth daily. Take with or immediately following a meal.  30 tablet  11  . pantoprazole (PROTONIX) 40 MG tablet Take 1 tablet (40 mg total) by mouth daily.  30 tablet  12  . peg 3350 powder (MOVIPREP) 100 G SOLR Take 1 kit (100 g total) by mouth as directed.  1 kit  0  . Probiotic Product (RESTORA) CAPS Take 1 capsule by mouth daily.      . Rivaroxaban (XARELTO) 20 MG TABS Take 1 tablet (20 mg total) by mouth daily with supper.  30 tablet  11  . sertraline (ZOLOFT) 50 MG tablet Take 50 mg by mouth daily.      Marland Kitchen SPIRIVA HANDIHALER 18 MCG inhalation capsule Place 18 mcg into inhaler and inhale daily.       Marland Kitchen zolpidem (AMBIEN) 5 MG tablet Take 5 mg by mouth at bedtime as needed. For sleep       No current facility-administered medications for this visit.     Past Surgical History  Procedure Laterality Date  . Breast biopsy      left, [facility]  . Cervical conization  w/bx  08/11/2011    Procedure: CONIZATION CERVIX WITH BIOPSY;  Surgeon: Lazaro Arms, MD;  Location: [facility];  Service: Gynecology;  Laterality: N/A;  Laser Conization of Cervix  . Breast excisional biopsy      Left x2  . Tee without cardioversion  06/19/2012    Procedure: TRANSESOPHAGEAL ECHOCARDIOGRAM (TEE);  Surgeon: Vesta Mixer, MD;  Location: [facility] ENDOSCOPY;  Service: Cardiovascular;  Laterality: N/A;  . Cardioversion  06/19/2012    Procedure: CARDIOVERSION;  Surgeon: Vesta Mixer, MD;  Location: [facility] ENDOSCOPY;  Service: Cardiovascular;;  . Cardioversion  06/21/2012    Procedure: CARDIOVERSION;  Surgeon: Cassell Clement, MD;  Location: [facility] OR;  Service: Cardiovascular;  Laterality: N/A;     No Known Allergies    Family History  Problem Relation Age of Onset  .  Anesthesia problems Neg Hx   . Hypotension Neg Hx   . Malignant hyperthermia Neg Hx   . Pseudochol deficiency Neg Hx   . Cancer Father     carcinoma of the lung  . Cancer Sister     carcinoma of the lung  . Colon cancer Neg Hx   . Cervical cancer Mother   . Irritable bowel syndrome      mother, sister  . Celiac disease Neg Hx   . Inflammatory bowel disease Neg Hx      Social History [name] [name] reports that she has never smoked. She has never used smokeless tobacco. [name] [name] reports that she does not drink alcohol.   Review of Systems CONSTITUTIONAL: No weight loss, fever, chills, weakness or fatigue.  HEENT: Eyes: No visual loss, blurred vision, double vision or yellow sclerae.No hearing loss, sneezing, congestion, runny nose or sore throat.  SKIN: No rash or itching.  CARDIOVASCULAR: per HPI RESPIRATORY: per HPI  GASTROINTESTINAL: No anorexia, nausea, vomiting or diarrhea. No abdominal pain or blood.  GENITOURINARY: No burning on urination, no polyuria NEUROLOGICAL: No headache, dizziness, syncope, paralysis, ataxia, numbness or tingling in the extremities. No change in bowel or bladder control.  MUSCULOSKELETAL: No muscle, back pain, joint pain or stiffness.  LYMPHATICS: No enlarged nodes. No history of splenectomy.  PSYCHIATRIC: No history of depression or anxiety.  ENDOCRINOLOGIC: No reports of sweating, cold or heat intolerance. No polyuria or polydipsia.  Marland Kitchen   Physical Examination p 86 bp 124/80 Wt 127 BMI 22 Gen: resting comfortably, no acute distress HEENT: no scleral icterus, pupils equal round and reactive, no palptable cervical adenopathy,  CV: RRR, no m/r/g, no JVD, no carotid bruits Resp: Clear to auscultation bilaterally GI: abdomen is soft, non-tender, non-distended, normal bowel sounds, no hepatosplenomegaly MSK: extremities are warm, no edema.  Skin: warm, no rash Neuro:  no focal deficits Psych: appropriate affect   Diagnostic Studies 06/2012 TEE:  mod to severe MR, milld to mod TR  06/12/12 Cath Hemodynamics:  AO 118/68 (83)  LV 119/8  Cannot calculate gradient due to rapid atrial fib  Coronary angiography:  Coronary dominance: right  Left mainstem: Short without significant disease  Left anterior descending (LAD): There is calcification proximally. There are two major diagonal branches. Between the first and second diagonal branches are approximately 30-40% plaquing. The distal LAD has mild irregularity. Both diagonals have minimal irregularity.  Left circumflex (LCx): Provides one large bifurcation marginal Malene Blaydes and a small AV circumflex. There is minor distal irregularity. The AV circ is small  Right coronary artery (RCA): Provides a large PDA and a large and smaller PLA branches. No significant focal  obstruction.  Left ventriculography: Due to ectopy, EF cannot be calculated. With AF, difficult to be sure of function but appears 35%. There does appear to be some MR.  Final Conclusions:  1. Atrial fib with rapid ventricular response.  2. Mild calcification of proximal LAD without critical obstruction. No high grade coronary artery disease.     Assessment and Plan  1. NICM - LVEF 30-35%, NYHA 2. She is euvolemic today in clinic - will increase her Toprol XL to 100mg  daily, counseled to monitor for side effects. Once medications optimally titrated, she will need to be considered for an ICD. She has a history of a BiV pacemaker after her AV nodal ablation with the LV lead turned off due to diaphragmatic stim, and at some point may qualify for BiV ICD. She is followed by EP  2. Afib - hx of av nodal ablation with pacemaker - denies any symptoms, pacer with normal function at last check - continue xarelto for stroke prophylaxis.       [name], M.D., F.A.C.C.

## 2013-06-27 ENCOUNTER — Ambulatory Visit (INDEPENDENT_AMBULATORY_CARE_PROVIDER_SITE_OTHER): Payer: PRIVATE HEALTH INSURANCE | Admitting: Cardiology

## 2013-06-27 ENCOUNTER — Encounter: Payer: Self-pay | Admitting: Cardiology

## 2013-06-27 VITALS — BP 112/58 | HR 88 | Ht 64.0 in | Wt 132.0 lb

## 2013-06-27 DIAGNOSIS — I428 Other cardiomyopathies: Secondary | ICD-10-CM

## 2013-06-27 DIAGNOSIS — I4891 Unspecified atrial fibrillation: Secondary | ICD-10-CM

## 2013-06-27 MED ORDER — METOPROLOL SUCCINATE ER 100 MG PO TB24: 150.0000 mg | ORAL_TABLET | Freq: Every day | ORAL | Status: DC

## 2013-06-27 NOTE — Patient Instructions (Addendum)
Your physician recommends that you schedule a follow-up appointment in: ONE MONTH  Your physician has recommended you make the following change in your medication:   1) INCREASE TOPROL XL TO 150MG  ONCE DAILY (TAKE ONE AND A HALF TABLET ONCE DAILY)

## 2013-06-27 NOTE — Progress Notes (Signed)
Clinical Summary [name] [name] is a 57 y.o.female seen today for follow up of the following medical problems.   1. NICM  - LVEF 30-35%  - cath Jan 2011 showed no obstructive CAD  - denies any significant SOB, no chest pain, no orthopnea, no LE edema.  - limiting sodium intake, takes NSAIDs rarely  - compliant with meds  - walks 30 min daily without troubles.   - last visit increased Toprol XL to 100mg , denies any side effects.   2. Chronic afib  - prior AV nodal ablation with permanent pacemaker  - biventricular pacer, however LV lead off due to diaphgramatic stimulation  - normal device check at her EP visit 04/30/13  - no palpitations, no lightheadedness or dizziness. No bleeding troubles on rivaroxaban      Past Medical History  Diagnosis Date  . COPD (chronic obstructive pulmonary disease)   . Anxiety and depression   . Atrial fibrillation     Echocardiogram in 2004-borderline LV function; no valvular abnormalities; 06/2012: AV nodal ablation + biventricular pacemaker  . Anemia     2011: Hemoglobin of 11.4; hematocrit of 33.5; normal MCV  . Cardiac arrest 06/2012    16 day hospitalization; caused by hyperkalemia; multiple complications including CHF; permanent atrial fibrillation requiring AV nodal ablation + pacing  . Thyroid disease   . Nonischemic cardiomyopathy     Echo 06/2012: EF 30-35%, moderate to severe MR; cardiac cath 06/2012:30-40% D1 and D2, estimated EF-35%, MR-not graded; right bundle [name] [name] block     No Known Allergies   Current Outpatient Prescriptions  Medication Sig Dispense Refill  . ALPRAZolam (XANAX) 0.25 MG tablet Take 0.5 mg by mouth at bedtime as needed. For anxiety      . calcium carbonate (TUMS - DOSED IN MG ELEMENTAL CALCIUM) 500 MG chewable tablet Chew 2 tablets by mouth 3 (three) times daily.      . furosemide (LASIX) 40 MG tablet Take 20 mg by mouth daily.      Marland Kitchen lactose free nutrition (BOOST) LIQD Take 237 mLs by mouth daily.        Marland Kitchen lisinopril (PRINIVIL,ZESTRIL) 10 MG tablet Take 1 tablet (10 mg total) by mouth daily.  30 tablet  11  . metoprolol succinate (TOPROL-XL) 100 MG 24 hr tablet Take 1 tablet (100 mg total) by mouth daily. Take with or immediately following a meal.  90 tablet  1  . metoprolol succinate (TOPROL-XL) 50 MG 24 hr tablet Take 1 tablet (50 mg total) by mouth daily. Take with or immediately following a meal.  30 tablet  11  . pantoprazole (PROTONIX) 40 MG tablet Take 1 tablet (40 mg total) by mouth daily.  30 tablet  12  . Rivaroxaban (XARELTO) 20 MG TABS Take 1 tablet (20 mg total) by mouth daily with supper.  30 tablet  11  . sertraline (ZOLOFT) 50 MG tablet Take 50 mg by mouth daily.      Marland Kitchen SPIRIVA HANDIHALER 18 MCG inhalation capsule Place 18 mcg into inhaler and inhale daily.       Marland Kitchen zolpidem (AMBIEN) 5 MG tablet Take 5 mg by mouth at bedtime as needed. For sleep       No current facility-administered medications for this visit.     Past Surgical History  Procedure Laterality Date  . Breast biopsy      left, [facility]  . Cervical conization w/bx  08/11/2011    Procedure: CONIZATION CERVIX WITH BIOPSY;  Surgeon:  Lazaro Arms, MD;  Location: [facility];  Service: Gynecology;  Laterality: N/A;  Laser Conization of Cervix  . Breast excisional biopsy      Left x2  . Tee without cardioversion  06/19/2012    Procedure: TRANSESOPHAGEAL ECHOCARDIOGRAM (TEE);  Surgeon: Vesta Mixer, MD;  Location: [facility] ENDOSCOPY;  Service: Cardiovascular;  Laterality: N/A;  . Cardioversion  06/19/2012    Procedure: CARDIOVERSION;  Surgeon: Vesta Mixer, MD;  Location: [facility] [place] ENDOSCOPY;  Service: Cardiovascular;;  . Cardioversion  06/21/2012    Procedure: CARDIOVERSION;  Surgeon: Cassell Clement, MD;  Location: [facility] OR;  Service: Cardiovascular;  Laterality: N/A;     No Known Allergies    Family History  Problem Relation Age of Onset  . Anesthesia problems Neg Hx   . Hypotension Neg Hx   . Malignant hyperthermia Neg Hx    . Pseudochol deficiency Neg Hx   . Cancer Father     carcinoma of the lung  . Cancer Sister     carcinoma of the lung  . Colon cancer Neg Hx   . Cervical cancer Mother   . Irritable bowel syndrome      mother, sister  . Celiac disease Neg Hx   . Inflammatory bowel disease Neg Hx      Social History Ms. Westry reports that she has never smoked. She has never used smokeless tobacco. Ms. Guinther reports that she does not drink alcohol.   Review of Systems CONSTITUTIONAL: No weight loss, fever, chills, weakness or fatigue.  HEENT: Eyes: No visual loss, blurred vision, double vision or yellow sclerae.No hearing loss, sneezing, congestion, runny nose or sore throat.  SKIN: No rash or itching.  CARDIOVASCULAR: per HPI RESPIRATORY: per HPI GASTROINTESTINAL: No anorexia, nausea, vomiting or diarrhea. No abdominal pain or blood.  GENITOURINARY: No burning on urination, no polyuria NEUROLOGICAL: No headache, dizziness, syncope, paralysis, ataxia, numbness or tingling in the extremities. No change in bowel or bladder control.  MUSCULOSKELETAL: No muscle, back pain, joint pain or stiffness.  LYMPHATICS: No enlarged nodes. No history of splenectomy.  PSYCHIATRIC: No history of depression or anxiety.  ENDOCRINOLOGIC: No reports of sweating, cold or heat intolerance. No polyuria or polydipsia.  Marland Kitchen   Physical Examination p 88 bp 112/58 Wt 132 lbs BMI 23 Gen: resting comfortably, no acute distress HEENT: no scleral icterus, pupils equal round and reactive, no palptable cervical adenopathy,  CV: RRR, no m/r/g, no JVD, no carotid bruits Resp: Clear to auscultation bilaterally GI: abdomen is soft, non-tender, non-distended, normal bowel sounds, no hepatosplenomegaly MSK: extremities are warm, no edema.  Skin: warm, no rash Neuro:  no focal deficits Psych: appropriate affect   Diagnostic Studies 06/2012 TEE: mod to severe MR, milld to mod TR   06/12/12 Cath  Hemodynamics:  AO 118/68  (83)  LV 119/8  Cannot calculate gradient due to rapid atrial fib  Coronary angiography:  Coronary dominance: right  Left mainstem: Short without significant disease  Left anterior descending (LAD): There is calcification proximally. There are two major diagonal branches. Between the first and second diagonal branches are approximately 30-40% plaquing. The distal LAD has mild irregularity. Both diagonals have minimal irregularity.  Left circumflex (LCx): Provides one large bifurcation marginal Treyson Axel and a small AV circumflex. There is minor distal irregularity. The AV circ is small  Right coronary artery (RCA): Provides a large PDA and a large and smaller PLA branches. No significant focal obstruction.  Left ventriculography: Due to ectopy, EF cannot be calculated.  With AF, difficult to be sure of function but appears 35%. There does appear to be some MR.  Final Conclusions:  1. Atrial fib with rapid ventricular response.  2. Mild calcification of proximal LAD without critical obstruction. No high grade coronary artery disease.   09/2012 Echo: LVEF 25%, severely dilated LV, mild LVH, mild AI, mild MR   Assessment and Plan  1. NICM  - LVEF 30-35%, NYHA 2. She is euvolemic today in clinic  - will increase her Toprol XL to 150mg  daily, counseled to monitor for side effects. Once medications optimally titrated, she will need to be considered for an ICD. She has a history of a BiV pacemaker after her AV nodal ablation with the LV lead turned off due to diaphragmatic stim, and at some point may qualify for BiV ICD. She is followed by EP    2. Afib  - hx of av nodal ablation with pacemaker  - denies any symptoms, pacer with normal function at last check  - continue xarelto for stroke prophylaxis.   3. Mitral regurgitation - moderate to severe by TEE 06/2012, recent TTE 09/2012 describes only mild. No current symptoms, likely functional MR related to her LV systolic dysfunction. - continue to  follow clinically.   F/u 1 month for futher medication titration    [name], M.D., F.A.C.C.

## 2013-08-08 ENCOUNTER — Ambulatory Visit: Payer: Self-pay | Admitting: Cardiology

## 2013-08-09 ENCOUNTER — Ambulatory Visit (INDEPENDENT_AMBULATORY_CARE_PROVIDER_SITE_OTHER): Payer: Self-pay | Admitting: Adult Health

## 2013-08-09 ENCOUNTER — Encounter: Payer: Self-pay | Admitting: Adult Health

## 2013-08-09 VITALS — BP 117/75 | HR 86 | Ht 64.0 in

## 2013-08-09 DIAGNOSIS — I4891 Unspecified atrial fibrillation: Secondary | ICD-10-CM

## 2013-08-09 DIAGNOSIS — I5022 Chronic systolic (congestive) heart failure: Secondary | ICD-10-CM

## 2013-08-09 DIAGNOSIS — I428 Other cardiomyopathies: Secondary | ICD-10-CM

## 2013-08-09 MED ORDER — CARVEDILOL 12.5 MG PO TABS: 12.5000 mg | ORAL_TABLET | Freq: Two times a day (BID) | ORAL | Status: DC

## 2013-08-09 NOTE — Progress Notes (Deleted)
Name: [name] [name]    DOB: January 31, 1956  Age: 58 y.o.  MR#: [id number]       PCP:  Fredirick Maudlin, MD      Insurance: Payor: BLUE CROSS BLUE SHIELD / Plan: BCBS PPO OUT OF STATE / Product Type: *No Product type* /   CC:    Chief Complaint  Patient presents with  . Atrial Fibrillation  . Cardiomyopathy    VS Filed Vitals:   08/09/13 1409  BP: 117/75  Pulse: 86  Height: 5\' 4"  (1.626 m)  SpO2: 94%    Weights Current Weight  06/27/13 132 lb (59.875 kg)  05/17/13 127 lb (57.607 kg)  04/30/13 128 lb (58.06 kg)    Blood Pressure  BP Readings from Last 3 Encounters:  08/09/13 117/75  06/27/13 112/58  05/17/13 124/80     Admit date:  (Not on file) Last encounter with RMR:  Visit date not found   Allergy Review of patient's allergies indicates no known allergies.  Current Outpatient Prescriptions  Medication Sig Dispense Refill  . ALPRAZolam (XANAX) 0.25 MG tablet Take 0.5 mg by mouth at bedtime as needed. For anxiety      . esomeprazole (NEXIUM) 20 MG capsule Take 20 mg by mouth daily at 12 noon.      . furosemide (LASIX) 40 MG tablet Take 20 mg by mouth daily.      Marland Kitchen lisinopril (PRINIVIL,ZESTRIL) 10 MG tablet Take 1 tablet (10 mg total) by mouth daily.  30 tablet  11  . metoprolol succinate (TOPROL-XL) 100 MG 24 hr tablet Take 2 tablets (200 mg total) by mouth daily. Take with or immediately following a meal.  60 tablet  6  . Rivaroxaban (XARELTO) 20 MG TABS Take 1 tablet (20 mg total) by mouth daily with supper.  30 tablet  11  . sertraline (ZOLOFT) 50 MG tablet Take 50 mg by mouth daily.      Marland Kitchen SPIRIVA HANDIHALER 18 MCG inhalation capsule Place 18 mcg into inhaler and inhale daily.       Marland Kitchen zolpidem (AMBIEN) 5 MG tablet Take 5 mg by mouth at bedtime as needed. For sleep      . calcium carbonate (TUMS - DOSED IN MG ELEMENTAL CALCIUM) 500 MG chewable tablet Chew 2 tablets by mouth 3 (three) times daily.       No current facility-administered medications for this visit.     Discontinued Meds:    Medications Discontinued During This Encounter  Medication Reason  . lactose free nutrition (BOOST) LIQD Error  . pantoprazole (PROTONIX) 40 MG tablet Error    Patient Active Problem List   Diagnosis Date Noted  . Chronic systolic heart failure 04/30/2013  . Irritable bowel syndrome 10/26/2012  . Pacemaker 10/02/2012  . Nonischemic cardiomyopathy   . Diabetes mellitus, type 2   . COPD (chronic obstructive pulmonary disease) 06/05/2012  . Cardiac arrest 06/02/2012  . Hypertension 02/15/2012  . Atrial fibrillation with RVR   . Anxiety and depression   . Anemia, normocytic normochromic     LABS    Component Value Date/Time   NA 137 12/01/2012 1444   NA 142 09/04/2012   NA 139 07/10/2012 1401   NA 136 06/28/2012 0515   K 5.0 12/01/2012 1444   K 4.3 09/04/2012   K 4.9 07/10/2012 1401   K 4.0 06/28/2012 0515   CL 99 12/01/2012 1444   CL 103 07/10/2012 1401   CL 98 06/28/2012 0515   CO2 29 12/01/2012  1444   CO2 27 07/10/2012 1401   CO2 30 06/28/2012 0515   GLUCOSE 84 12/01/2012 1444   GLUCOSE 74 07/10/2012 1401   GLUCOSE 93 06/28/2012 0515   BUN 31* 12/01/2012 1444   BUN 29* 09/04/2012   BUN 26* 07/10/2012 1401   BUN 26* 06/28/2012 0515   CREATININE 1.17* 12/01/2012 1444   CREATININE 0.9 09/04/2012   CREATININE 0.99 07/10/2012 1401   CREATININE 0.93 06/28/2012 0515   CREATININE 0.81 06/27/2012 0525   CREATININE 0.87 06/26/2012 0500   CALCIUM 10.3 12/01/2012 1444   CALCIUM 9.9 09/04/2012   CALCIUM 9.4 07/10/2012 1401   CALCIUM 9.4 06/28/2012 0515   GFRNONAA 67* 06/28/2012 0515   GFRNONAA 79* 06/27/2012 0525   GFRNONAA 73* 06/26/2012 0500   GFRAA 78* 06/28/2012 0515   GFRAA >90 06/27/2012 0525   GFRAA 84* 06/26/2012 0500   CMP     Component Value Date/Time   NA 137 12/01/2012 1444   NA 142 09/04/2012   K 5.0 12/01/2012 1444   K 4.3 09/04/2012   CL 99 12/01/2012 1444   CO2 29 12/01/2012 1444   GLUCOSE 84 12/01/2012 1444   BUN 31* 12/01/2012 1444   BUN 29* 09/04/2012    CREATININE 1.17* 12/01/2012 1444   CREATININE 0.9 09/04/2012   CREATININE 0.93 06/28/2012 0515   CALCIUM 10.3 12/01/2012 1444   CALCIUM 9.9 09/04/2012   PROT 7.2 12/01/2012 1444   ALBUMIN 4.6 12/01/2012 1444   AST 21 12/01/2012 1444   ALT 14 12/01/2012 1444   ALKPHOS 71 12/01/2012 1444   BILITOT 0.6 12/01/2012 1444   GFRNONAA 67* 06/28/2012 0515   GFRAA 78* 06/28/2012 0515       Component Value Date/Time   WBC 4.2 10/30/2012 1430   WBC 5.3 07/10/2012 1401   WBC 5.1 06/28/2012 0515   HGB 10.8* 10/30/2012 1430   HGB 11.3* 07/10/2012 1401   HGB 10.7* 06/28/2012 0515   HCT 32.6* 10/30/2012 1430   HCT 34.1* 07/10/2012 1401   HCT 31.6* 06/28/2012 0515   MCV 93.7 10/30/2012 1430   MCV 95.8 07/10/2012 1401   MCV 96.0 06/28/2012 0515    Lipid Panel     Component Value Date/Time   CHOL 105 06/13/2012 0848   TRIG 114 06/21/2012 0525   HDL 41 06/13/2012 0848   CHOLHDL 2.6 06/13/2012 0848   VLDL 18 06/13/2012 0848   LDLCALC 46 06/13/2012 0848    ABG    Component Value Date/Time   PHART 7.507* 06/14/2012 0439   PCO2ART 40.9 06/14/2012 0439   PO2ART 78.3* 06/14/2012 0439   HCO3 32.2* 06/14/2012 0439   TCO2 33.4 06/14/2012 0439   ACIDBASEDEF 0.3 06/05/2012 0515   O2SAT 97.1 06/14/2012 0439     Lab Results  Component Value Date   TSH 0.537 06/12/2012   BNP (last 3 results) No results found for this basename: PROBNP,  in the last 8760 hours Cardiac Panel (last 3 results) No results found for this basename: CKTOTAL, CKMB, TROPONINI, RELINDX,  in the last 72 hours  Iron/TIBC/Ferritin    Component Value Date/Time   IRON 45 06/04/2012 0830   TIBC 383 06/04/2012 0830   FERRITIN 186 06/04/2012 0830     EKG Orders placed in visit on 10/02/12  . EKG 12-LEAD     Prior Assessment and Plan Problem List as of 08/09/2013     Cardiovascular and Mediastinum   Atrial fibrillation with RVR   Last Assessment & Plan   04/30/2013 Office Visit  Written 04/30/2013 11:27 AM by Marinus MawGregg W Taylor, MD     Her  ventricular rate is well controlled. No change in medical therapy.     Hypertension   Last Assessment & Plan   11/17/2012 Office Visit Written 11/17/2012  5:39 PM by Kathlen Brunswickobert M Rothbart, MD     No elevated blood pressures detected over at least the past 6 months.    Cardiac arrest   Last Assessment & Plan   08/14/2012 Office Visit Written 08/14/2012  1:01 PM by Kathlen Brunswickobert M Rothbart, MD     Although she presented with substantial left ventricular dysfunction and subsequently suffered a cardiac arrest, there is no evidence that she has primary ventricular tachycardia or ventricular fibrillation. Accordingly, implantation of an AICD is not necessarily warranted.    Nonischemic cardiomyopathy   Last Assessment & Plan   11/17/2012 Office Visit Edited 11/18/2012  2:23 PM by Kathlen Brunswickobert M Rothbart, MD     No clinical congestive heart failure at present. Dose of lisinopril will be increased for increased efficacy. Patient advised as to the signs and symptoms of cerebral hypotension and will call should these develop.  Most recent EF estimated at 25%. EP should evaluate the advisability of upgrading device to AICD capability.  Nutritional  status improved-dietary supplement can be discontinued.    Chronic systolic heart failure   Last Assessment & Plan   04/30/2013 Office Visit Written 04/30/2013 11:29 AM by Marinus MawGregg W Taylor, MD     Her symptoms are class 2A. She will continue her current meds and I have asked her to maintain a low sodium diet.      Respiratory   COPD (chronic obstructive pulmonary disease)     Digestive   Irritable bowel syndrome   Last Assessment & Plan   10/26/2012 Office Visit Edited 10/27/2012  4:33 PM by Tiffany KocherLeslie S Lewis, PA-C     Patient gives h/o IBS. C/O pp urgency. No significant diarrhea. Trial of Restora one daily for three weeks. Samples provided. Trial of Bentyl prn.       Endocrine   Diabetes mellitus, type 2     Other   Anxiety and depression   Anemia, normocytic normochromic    Last Assessment & Plan   11/17/2012 Office Visit Written 11/17/2012  5:31 PM by Kathlen Brunswickobert M Rothbart, MD     Anemia has improved relative to her recent hospitalization with hemoglobin and hematocrit at a level similar to 2011.    Pacemaker   Last Assessment & Plan   04/30/2013 Office Visit Written 04/30/2013 11:28 AM by Marinus MawGregg W Taylor, MD     Her device is working normally. Her LV lead remains turned off.        Imaging: No results found.

## 2013-08-09 NOTE — Patient Instructions (Signed)
Your physician recommends that you schedule a follow-up appointment in: 1 month with Dr Wyline Mood  Your physician has recommended you make the following change in your medication:  1. STOP Toprol-XL 2. Start Coreg 12.5 mg twice a day.

## 2013-08-09 NOTE — Progress Notes (Signed)
HPI: [name] is a 58 year old patient of [name] [name] were following for ongoing assessment and management of nonischemic heart myopathy with an EF of 3035%, cath in January 2011 with nonobstructive CAD. 11 history of chronic atrial fibrillation with prior AV nodal ablation with permanent pacemaker. Patient was last seen by Dr. Wyline [name] in November of 2014. Toprol was increased to 250 mg daily. The results oh was continued discussion after optimal medical therapy for referral back to EP for consideration of IV ICD.   She comes today having gained some weight, but not from fluid. She is now out of work and is just beginning disability. She denies worsening dyspnea,chest pain or palpitations. She is beginning to increase activity by joining [name] [name]. She wants to get stronger.     No Known Allergies  Current Outpatient Prescriptions  Medication Sig Dispense Refill  . ALPRAZolam (XANAX) 0.25 MG tablet Take 0.5 mg by mouth at bedtime as needed. For anxiety      . esomeprazole (NEXIUM) 20 MG capsule Take 20 mg by mouth daily at 12 noon.      . furosemide (LASIX) 40 MG tablet Take 20 mg by mouth daily.      Marland Kitchen lisinopril (PRINIVIL,ZESTRIL) 10 MG tablet Take 1 tablet (10 mg total) by mouth daily.  30 tablet  11  . metoprolol succinate (TOPROL-XL) 100 MG 24 hr tablet Take 2 tablets (200 mg total) by mouth daily. Take with or immediately following a meal.  60 tablet  6  . Rivaroxaban (XARELTO) 20 MG TABS Take 1 tablet (20 mg total) by mouth daily with supper.  30 tablet  11  . sertraline (ZOLOFT) 50 MG tablet Take 50 mg by mouth daily.      Marland Kitchen SPIRIVA HANDIHALER 18 MCG inhalation capsule Place 18 mcg into inhaler and inhale daily.       Marland Kitchen zolpidem (AMBIEN) 5 MG tablet Take 5 mg by mouth at bedtime as needed. For sleep      . calcium carbonate (TUMS - DOSED IN MG ELEMENTAL CALCIUM) 500 MG chewable tablet Chew 2 tablets by mouth 3 (three) times daily.       No current facility-administered  medications for this visit.    Past Medical History  Diagnosis Date  . COPD (chronic obstructive pulmonary disease)   . Anxiety and depression   . Atrial fibrillation     Echocardiogram in 2004-borderline LV function; no valvular abnormalities; 06/2012: AV nodal ablation + biventricular pacemaker  . Anemia     2011: Hemoglobin of 11.4; hematocrit of 33.5; normal MCV  . Cardiac arrest 06/2012    16 day hospitalization; caused by hyperkalemia; multiple complications including CHF; permanent atrial fibrillation requiring AV nodal ablation + pacing  . Thyroid disease   . Nonischemic cardiomyopathy     Echo 06/2012: EF 30-35%, moderate to severe MR; cardiac cath 06/2012:30-40% D1 and D2, estimated EF-35%, MR-not graded; right bundle [name] block    Past Surgical History  Procedure Laterality Date  . Breast biopsy      left, [facility]  . Cervical conization w/bx  08/11/2011    Procedure: CONIZATION CERVIX WITH BIOPSY;  Surgeon: Lazaro Arms, MD;  Location: [facility];  Service: Gynecology;  Laterality: N/A;  Laser Conization of Cervix  . Breast excisional biopsy      Left x2  . Tee without cardioversion  06/19/2012    Procedure: TRANSESOPHAGEAL ECHOCARDIOGRAM (TEE);  Surgeon: Vesta Mixer, MD;  Location: [facility] ENDOSCOPY;  Service:  Cardiovascular;  Laterality: N/A;  . Cardioversion  06/19/2012    Procedure: CARDIOVERSION;  Surgeon: Vesta Mixer, MD;  Location: [facility] ENDOSCOPY;  Service: Cardiovascular;;  . Cardioversion  06/21/2012    Procedure: CARDIOVERSION;  Surgeon: Cassell Clement, MD;  Location: [facility] OR;  Service: Cardiovascular;  Laterality: N/A;    QIH:KVQQVZ of systems complete and found to be negative unless listed above  PHYSICAL EXAM BP 117/75  Pulse 86  Ht 5\' 4"  (1.626 m)  SpO2 94%  General: Well developed, well nourished, in no acute distress.She is breathing heavily when talking but does not appear short of breath.  Head: Eyes PERRLA, No xanthomas.   Normal cephalic and  atramatic  Lungs: Clear bilaterally no wheezes or coughing.  Heart: HRRR S1 S2, slightly tachycardic without MRG.  Pulses are 2+ & equal.            No carotid bruit. No JVD.  No abdominal bruits. No femoral bruits. Abdomen: Bowel sounds are positive, abdomen soft and non-tender without masses or                  Hernia's noted. Msk:  Back normal, normal gait. Normal strength and tone for age. Extremities: No clubbing, cyanosis or edema.  DP +1 Neuro: Alert and oriented X 3. Psych:  Good affect, responds appropriately  ASSESSMENT AND PLAN

## 2013-08-09 NOTE — Assessment & Plan Note (Signed)
No evidence of fluid overload at this time. She is to continue to weigh daily and watch salt intake. She is to call for weight gain of 5 lbs in 2 days.

## 2013-08-09 NOTE — Assessment & Plan Note (Signed)
She is to continue on ACE and lasix. Those medications are also on [facility] $4 list. Will repeat echo once she is optimized on all of her medications.

## 2013-08-09 NOTE — Assessment & Plan Note (Signed)
Heart rate is slightly tachycardic. She is to be changed to coreg 12.5 mg BID after discussion with Dr. Wyline Mood, to allow [facility] $4 plan to assist her with her medication costs. She will finish off her current metoprolol dose and on refill, she will be changed to coreg. Samples of Xarelto have been provided.

## 2013-08-16 IMAGING — CR DG CHEST 1V PORT
1 series · 1 of 1 positions shown · non-contrast
Comparison: Chest x-ray 06/12/2012.

CLINICAL DATA: ETT placement.

PORTABLE CHEST - 1 VIEW

[AP]
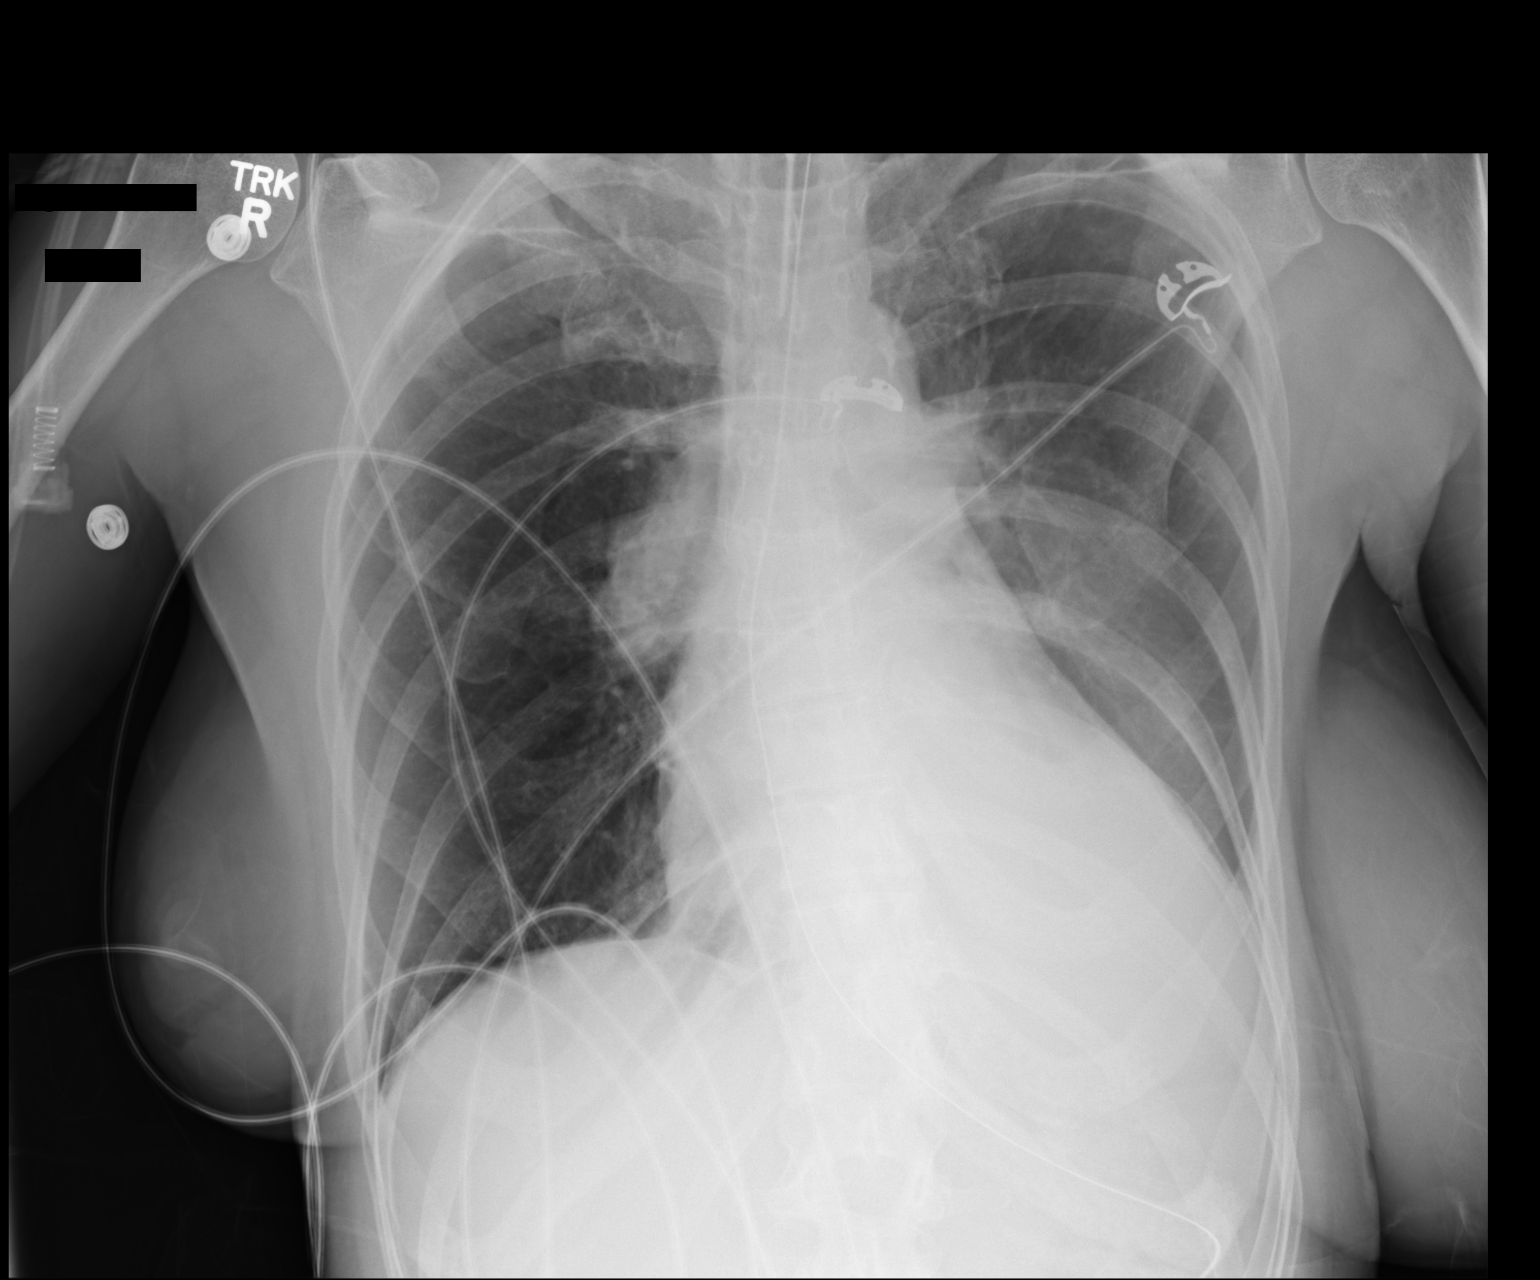

[1 of 1 positions shown; findings below may reference images not displayed]

FINDINGS: An endotracheal tube is in place with tip 4.3 cm above
the carina. A nasogastric tube is seen extending into the stomach,
however, the tip of the nasogastric tube extends below the lower
margin of the image.  Compared to the prior examination there is
now complete opacification of the base of the left hemithorax and
slight shift of structures toward the left hemithorax, suggestive
of complete left lower lobe atelectasis.  Lungs are otherwise
clear.  No definite pleural effusions.  No evidence of pulmonary
edema.  Mild cardiomegaly is unchanged.  Atherosclerosis of the
thoracic aorta.
IMPRESSION: 1.  Support apparatus, as above.
2.  Interval development of complete left lower lobe atelectasis.
Correlation for signs and symptoms of aspiration or mucous plugging
is recommended.

to Dr. Kerner, who verbally acknowledged these results.

## 2013-08-18 IMAGING — CR DG CHEST 1V PORT
1 series · 1 of 1 positions shown · non-contrast
Comparison: 06/13/2012

CLINICAL DATA: Ventilator, shortness of breath.

PORTABLE CHEST - 1 VIEW

[AP]
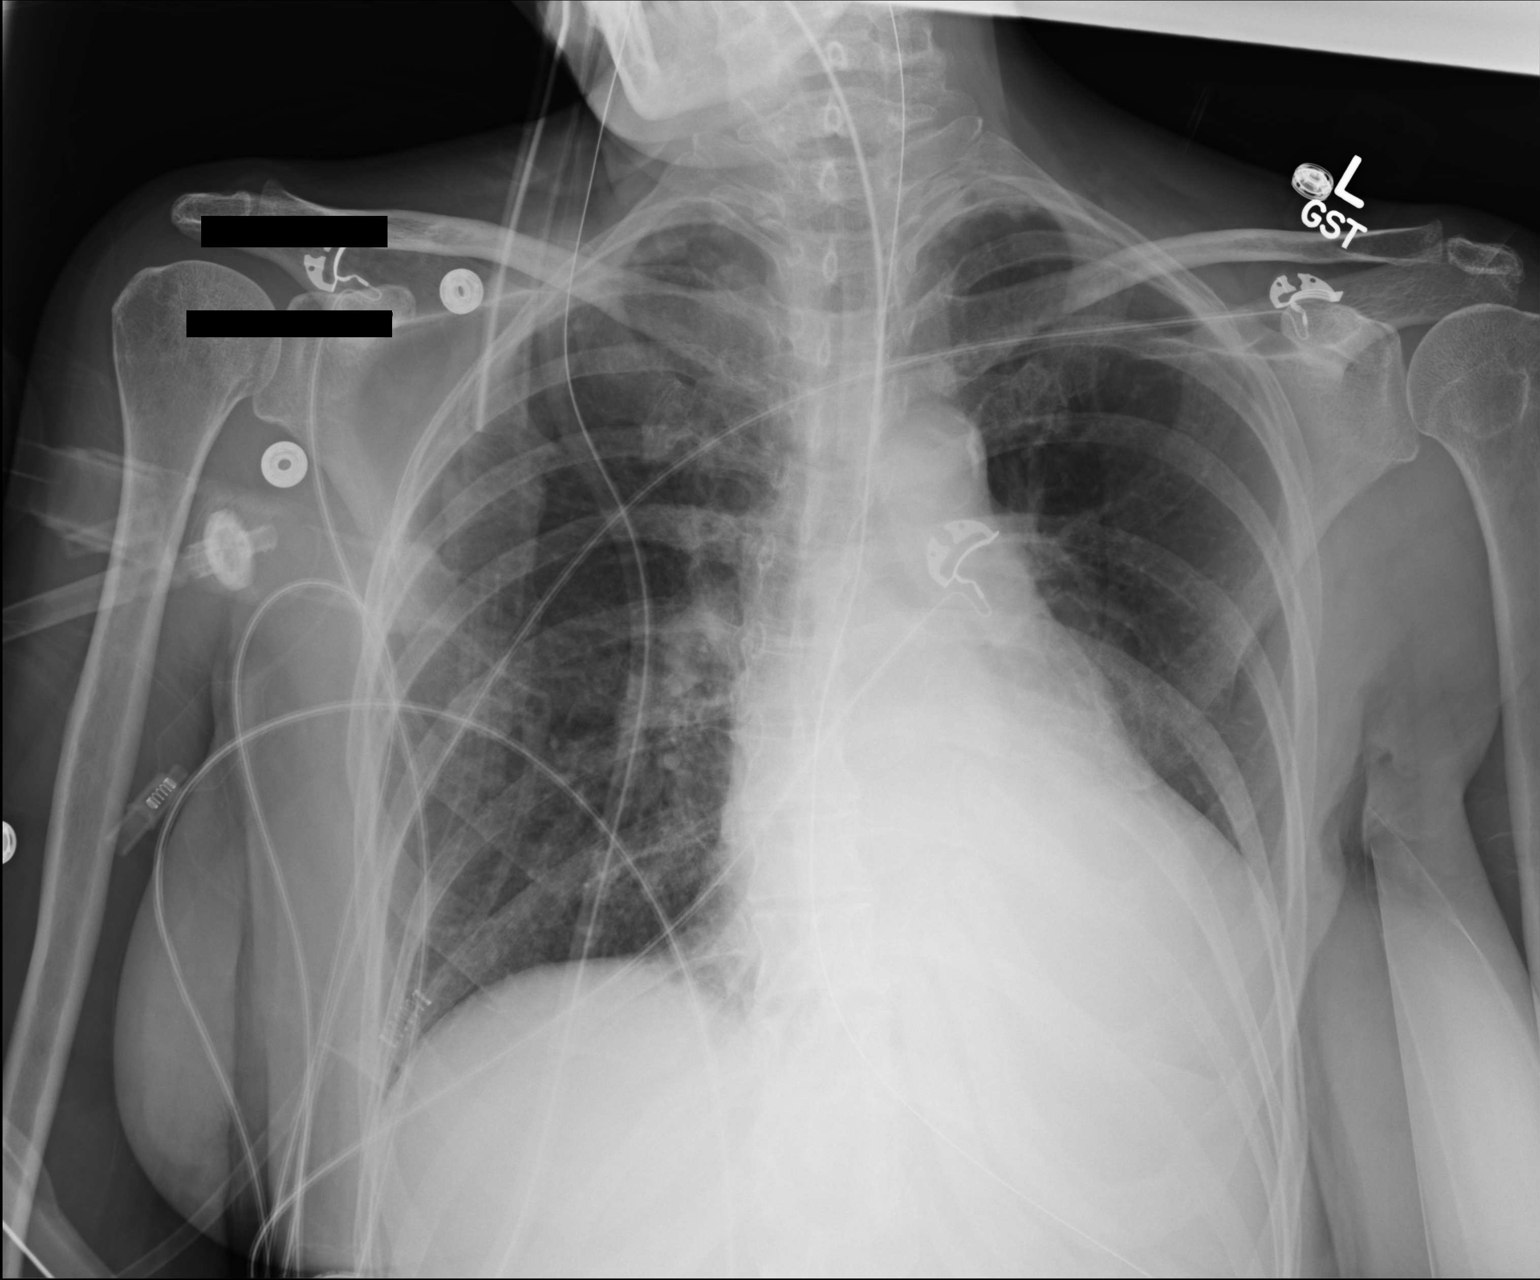

[1 of 1 positions shown; findings below may reference images not displayed]

FINDINGS: Support devices including endotracheal tube are
unchanged.  There is left base atelectasis or infiltrate, stable.
Right lung is clear.  Heart is mildly enlarged.  No visible
effusions.  No acute bony abnormality.
IMPRESSION: Stable left base atelectasis or infiltrate.

Mild cardiomegaly.

## 2013-09-26 ENCOUNTER — Encounter: Payer: Self-pay | Admitting: Cardiology

## 2013-09-26 ENCOUNTER — Ambulatory Visit (INDEPENDENT_AMBULATORY_CARE_PROVIDER_SITE_OTHER): Payer: PRIVATE HEALTH INSURANCE | Admitting: Cardiology

## 2013-09-26 VITALS — BP 110/65 | HR 90 | Ht 64.0 in | Wt 136.5 lb

## 2013-09-26 DIAGNOSIS — I4891 Unspecified atrial fibrillation: Secondary | ICD-10-CM

## 2013-09-26 DIAGNOSIS — I059 Rheumatic mitral valve disease, unspecified: Secondary | ICD-10-CM

## 2013-09-26 DIAGNOSIS — I5022 Chronic systolic (congestive) heart failure: Secondary | ICD-10-CM

## 2013-09-26 DIAGNOSIS — I340 Nonrheumatic mitral (valve) insufficiency: Secondary | ICD-10-CM

## 2013-09-26 MED ORDER — CARVEDILOL 25 MG PO TABS: 25.0000 mg | ORAL_TABLET | Freq: Two times a day (BID) | ORAL | Status: DC

## 2013-09-26 NOTE — Progress Notes (Signed)
Clinical Summary [name] [name] is a 58 y.o.female seen today for follow up of the following meidcal problems.   1. NICM  - echo 09/2012 LVEF 25%  - cath Jan 2011 showed no obstructive CAD  - denies any significant SOB, no chest pain, no orthopnea, no LE edema.  - limiting sodium intake, takes NSAIDs rarely  - compliant with meds  - last visit changed from Toprol XL to coreg for cost issue,she is on 12.16m bid. Does note some lightheadeness with standing, occurs just once or twice a week and overall is mild.    2. Chronic afib  - prior AV nodal ablation with permanent pacemaker  - biventricular pacer, however LV lead off due to diaphgramatic stimulation  - normal device check at her EP visit 04/30/13  - no palpitations, no lightheadedness or dizziness. No bleeding troubles on rivaroxaban    Past Medical History  Diagnosis Date  . COPD (chronic obstructive pulmonary disease)   . Anxiety and depression   . Atrial fibrillation     Echocardiogram in 2004-borderline LV function; no valvular abnormalities; 06/2012: AV nodal ablation + biventricular pacemaker  . Anemia     2011: Hemoglobin of 11.4; hematocrit of 33.5; normal MCV  . Cardiac arrest 06/2012    16 day hospitalization; caused by hyperkalemia; multiple complications including CHF; permanent atrial fibrillation requiring AV nodal ablation + pacing  . Thyroid disease   . Nonischemic cardiomyopathy     Echo 06/2012: EF 30-35%, moderate to severe MR; cardiac cath 06/2012:30-40% D1 and D2, estimated EF-35%, MR-not graded; right bundle branch block     No Known Allergies   Current Outpatient Prescriptions  Medication Sig Dispense Refill  . ALPRAZolam (XANAX) 0.25 MG tablet Take 0.5 mg by mouth at bedtime as needed. For anxiety      . calcium carbonate (TUMS - DOSED IN MG ELEMENTAL CALCIUM) 500 MG chewable tablet Chew 2 tablets by mouth 3 (three) times daily.      . carvedilol (COREG) 12.5 MG tablet Take 1 tablet (12.5 mg total)  by mouth 2 (two) times daily.  60 tablet  3  . esomeprazole (NEXIUM) 20 MG capsule Take 20 mg by mouth daily at 12 noon.      . furosemide (LASIX) 40 MG tablet Take 20 mg by mouth daily.      Marland Kitchen lisinopril (PRINIVIL,ZESTRIL) 10 MG tablet Take 1 tablet (10 mg total) by mouth daily.  30 tablet  11  . Rivaroxaban (XARELTO) 20 MG TABS Take 1 tablet (20 mg total) by mouth daily with supper.  30 tablet  11  . sertraline (ZOLOFT) 50 MG tablet Take 50 mg by mouth daily.      Marland Kitchen SPIRIVA HANDIHALER 18 MCG inhalation capsule Place 18 mcg into inhaler and inhale daily.       Marland Kitchen zolpidem (AMBIEN) 5 MG tablet Take 5 mg by mouth at bedtime as needed. For sleep       No current facility-administered medications for this visit.     Past Surgical History  Procedure Laterality Date  . Breast biopsy      left, [facility]  . Cervical conization w/bx  08/11/2011    Procedure: CONIZATION CERVIX WITH BIOPSY;  Surgeon: Lazaro Arms, MD;  Location: [facility];  Service: Gynecology;  Laterality: N/A;  Laser Conization of Cervix  . Breast excisional biopsy      Left x2  . Tee without cardioversion  06/19/2012    Procedure: TRANSESOPHAGEAL ECHOCARDIOGRAM (  TEE);  Surgeon: Vesta Mixer, MD;  Location: [facility] ENDOSCOPY;  Service: Cardiovascular;  Laterality: N/A;  . Cardioversion  06/19/2012    Procedure: CARDIOVERSION;  Surgeon: Vesta Mixer, MD;  Location: [facility] ENDOSCOPY;  Service: Cardiovascular;;  . Cardioversion  06/21/2012    Procedure: CARDIOVERSION;  Surgeon: Cassell Clement, MD;  Location: [facility] OR;  Service: Cardiovascular;  Laterality: N/A;     No Known Allergies    Family History  Problem Relation Age of Onset  . Anesthesia problems Neg Hx   . Hypotension Neg Hx   . Malignant hyperthermia Neg Hx   . Pseudochol deficiency Neg Hx   . Cancer Father     carcinoma of the lung  . Cancer Sister     carcinoma of the lung  . Colon cancer Neg Hx   . Cervical cancer Mother   . Irritable bowel syndrome      mother,  sister  . Celiac disease Neg Hx   . Inflammatory bowel disease Neg Hx      Social History [name] [name] reports that she has never smoked. She has never used smokeless tobacco. [name] [name] reports that she does not drink alcohol.   Review of Systems CONSTITUTIONAL: No weight loss, fever, chills, weakness or fatigue.  HEENT: Eyes: No visual loss, blurred vision, double vision or yellow sclerae.No hearing loss, sneezing, congestion, runny nose or sore throat.  SKIN: No rash or itching.  CARDIOVASCULAR: per HPI RESPIRATORY: No shortness of breath, cough or sputum.  GASTROINTESTINAL: No anorexia, nausea, vomiting or diarrhea. No abdominal pain or blood.  GENITOURINARY: No burning on urination, no polyuria NEUROLOGICAL: No headache, dizziness, syncope, paralysis, ataxia, numbness or tingling in the extremities. No change in bowel or bladder control.  MUSCULOSKELETAL: No muscle, back pain, joint pain or stiffness.  LYMPHATICS: No enlarged nodes. No history of splenectomy.  PSYCHIATRIC: No history of depression or anxiety.  ENDOCRINOLOGIC: No reports of sweating, cold or heat intolerance. No polyuria or polydipsia.  Marland Kitchen   Physical Examination p 90 bp 110/65 Wt 136 lbs BMI 23 Gen: resting comfortably, no acute distress HEENT: no scleral icterus, pupils equal round and reactive, no palptable cervical adenopathy,  CV: RRR, no m/r/g, no JVD, no carotid bruits Resp: Clear to auscultation bilaterally GI: abdomen is soft, non-tender, non-distended, normal bowel sounds, no hepatosplenomegaly MSK: extremities are warm, no edema.  Skin: warm, no rash Neuro:  no focal deficits Psych: appropriate affect   Diagnostic Studies 06/2012 TEE: mod to severe MR, milld to mod TR   06/12/12 Cath  Hemodynamics:  AO 118/68 (83)  LV 119/8  Cannot calculate gradient due to rapid atrial fib  Coronary angiography:  Coronary dominance: right  Left mainstem: Short without significant disease  Left anterior  descending (LAD): There is calcification proximally. There are two major diagonal branches. Between the first and second diagonal branches are approximately 30-40% plaquing. The distal LAD has mild irregularity. Both diagonals have minimal irregularity.  Left circumflex (LCx): Provides one large bifurcation marginal branch and a small AV circumflex. There is minor distal irregularity. The AV circ is small  Right coronary artery (RCA): Provides a large PDA and a large and smaller PLA branches. No significant focal obstruction.  Left ventriculography: Due to ectopy, EF cannot be calculated. With AF, difficult to be sure of function but appears 35%. There does appear to be some MR.  Final Conclusions:  1. Atrial fib with rapid ventricular response.  2. Mild calcification of proximal LAD without critical  obstruction. No high grade coronary artery disease.   09/2012 Echo: LVEF 25%, severely dilated LV, mild LVH, mild AI, mild MR   09/26/13 Clinic EKG Afib, V-paced     Assessment and Plan  1. NICM  - LVEF 25% by echo 09/2012, NYHA 2. She is euvolemic today in clinic  - will increase coreg to 25mg  bid. Once medications optimally titrated, she will need to be considered for an ICD. She has a history of a BiV pacemaker after her AV nodal ablation with the LV lead turned off due to diaphragmatic stim, and at some point may qualify for BiV ICD. She is followed by EP   2. Afib  - hx of av nodal ablation with pacemaker  - denies any symptoms, pacer with normal function at last check  - continue xarelto for stroke prophylaxis.   3. Mitral regurgitation  - moderate to severe by TEE 06/2012, recent TTE 09/2012 describes only mild. No current symptoms, likely functional MR related to her LV systolic dysfunction.  - continue to follow clinically.   F/u 1 month for futher medication titration. If tolerated will begin titrating her ACE-I.       Antoine PocheJonathan F. Branch, M.D., F.A.C.C.

## 2013-09-26 NOTE — Patient Instructions (Signed)
Your physician recommends that you schedule a follow-up appointment in: 3-4 WEEKS  Your physician has recommended you make the following change in your medication:   1) INCREASE YOUR COREG TO 25MG  TWICE DAILY

## 2013-10-10 ENCOUNTER — Telehealth: Payer: Self-pay | Admitting: *Deleted

## 2013-10-10 NOTE — Telephone Encounter (Signed)
called pt and made her aware that I called prescription into [facility] in [place].

## 2013-10-10 NOTE — Telephone Encounter (Signed)
Pt is out of town and needs rx for xerelto called in to [facility] in [place] [address] [phone]

## 2013-10-18 ENCOUNTER — Encounter: Payer: Self-pay | Admitting: Cardiology

## 2013-11-02 DIAGNOSIS — J449 Chronic obstructive pulmonary disease, unspecified: Secondary | ICD-10-CM | POA: Diagnosis not present

## 2013-11-02 DIAGNOSIS — I4891 Unspecified atrial fibrillation: Secondary | ICD-10-CM | POA: Diagnosis not present

## 2013-11-02 DIAGNOSIS — F411 Generalized anxiety disorder: Secondary | ICD-10-CM | POA: Diagnosis not present

## 2013-11-02 DIAGNOSIS — N19 Unspecified kidney failure: Secondary | ICD-10-CM | POA: Diagnosis not present

## 2013-11-02 DIAGNOSIS — J309 Allergic rhinitis, unspecified: Secondary | ICD-10-CM | POA: Diagnosis not present

## 2013-11-02 DIAGNOSIS — I509 Heart failure, unspecified: Secondary | ICD-10-CM | POA: Diagnosis not present

## 2013-11-02 DIAGNOSIS — F3289 Other specified depressive episodes: Secondary | ICD-10-CM | POA: Diagnosis not present

## 2013-11-02 DIAGNOSIS — F329 Major depressive disorder, single episode, unspecified: Secondary | ICD-10-CM | POA: Diagnosis not present

## 2013-11-02 DIAGNOSIS — E109 Type 1 diabetes mellitus without complications: Secondary | ICD-10-CM | POA: Diagnosis not present

## 2013-11-05 ENCOUNTER — Ambulatory Visit: Payer: Self-pay | Admitting: Cardiology

## 2013-11-06 ENCOUNTER — Ambulatory Visit (INDEPENDENT_AMBULATORY_CARE_PROVIDER_SITE_OTHER): Payer: Self-pay | Admitting: Cardiology

## 2013-11-06 ENCOUNTER — Encounter: Payer: Self-pay | Admitting: Cardiology

## 2013-11-06 VITALS — BP 98/54 | HR 75 | Ht 64.0 in | Wt 135.8 lb

## 2013-11-06 DIAGNOSIS — I5022 Chronic systolic (congestive) heart failure: Secondary | ICD-10-CM

## 2013-11-06 DIAGNOSIS — I340 Nonrheumatic mitral (valve) insufficiency: Secondary | ICD-10-CM

## 2013-11-06 DIAGNOSIS — I4891 Unspecified atrial fibrillation: Secondary | ICD-10-CM

## 2013-11-06 DIAGNOSIS — I059 Rheumatic mitral valve disease, unspecified: Secondary | ICD-10-CM

## 2013-11-06 NOTE — Patient Instructions (Signed)
Your physician recommends that you schedule a follow-up appointment in: 3 months with Dr. Branch. You should receive a letter in the mail in 1-2 months. If you do not receive this letter by May or June 2015 call our office to schedule this appointment.   Your physician recommends that you continue on your current medications as directed. Please refer to the Current Medication list given to you today.  Your physician has requested that you have an echocardiogram. Echocardiography is a painless test that uses sound waves to create images of your heart. It provides your doctor with information about the size and shape of your heart and how well your heart's chambers and valves are working. This procedure takes approximately one hour. There are no restrictions for this procedure.   

## 2013-11-06 NOTE — Progress Notes (Signed)
Clinical Summary [name] [name] is a 58 y.o.female seen today for follow up of the following medical problems.   1. NICM  - echo 09/2012 LVEF 25%  - cath Jan 2011 showed no obstructive CAD  - denies any significant SOB, no chest pain, no orthopnea, no LE edema.  - limiting sodium intake, takes NSAIDs rarely  - compliant with meds   - last visit increased coreg to 25mg  bid. Reports some very mild lightheadedness at times, but otherwise doing well.  - reports breathing somewhat better since last visit. States she can walk several blocks without issues. No orthopnea, no LE edema   2. Chronic afib  - prior AV nodal ablation with permanent pacemaker  - biventricular pacer, however LV lead off due to diaphgramatic stimulation per notes - normal device check at her EP visit 04/30/13  - no palpitations, no lightheadedness or dizziness. No bleeding troubles on rivaroxaban  Past Medical History  Diagnosis Date  . COPD (chronic obstructive pulmonary disease)   . Anxiety and depression   . Atrial fibrillation     Echocardiogram in 2004-borderline LV function; no valvular abnormalities; 06/2012: AV nodal ablation + biventricular pacemaker  . Anemia     2011: Hemoglobin of 11.4; hematocrit of 33.5; normal MCV  . Cardiac arrest 06/2012    16 day hospitalization; caused by hyperkalemia; multiple complications including CHF; permanent atrial fibrillation requiring AV nodal ablation + pacing  . Thyroid disease   . Nonischemic cardiomyopathy     Echo 06/2012: EF 30-35%, moderate to severe MR; cardiac cath 06/2012:30-40% D1 and D2, estimated EF-35%, MR-not graded; right bundle Kayna Suppa block     No Known Allergies   Current Outpatient Prescriptions  Medication Sig Dispense Refill  . ALPRAZolam (XANAX) 0.25 MG tablet Take 0.5 mg by mouth at bedtime as needed. For anxiety      . calcium carbonate (TUMS - DOSED IN MG ELEMENTAL CALCIUM) 500 MG chewable tablet Chew 2 tablets by mouth 3 (three) times  daily.      . carvedilol (COREG) 25 MG tablet Take 1 tablet (25 mg total) by mouth 2 (two) times daily.  60 tablet  6  . esomeprazole (NEXIUM) 20 MG capsule Take 20 mg by mouth daily at 12 noon.      . furosemide (LASIX) 40 MG tablet Take 20 mg by mouth daily.      Marland Kitchen lisinopril (PRINIVIL,ZESTRIL) 10 MG tablet Take 1 tablet (10 mg total) by mouth daily.  30 tablet  11  . Rivaroxaban (XARELTO) 20 MG TABS Take 1 tablet (20 mg total) by mouth daily with supper.  30 tablet  11  . sertraline (ZOLOFT) 50 MG tablet Take 50 mg by mouth daily.      Marland Kitchen SPIRIVA HANDIHALER 18 MCG inhalation capsule Place 18 mcg into inhaler and inhale daily.       Marland Kitchen zolpidem (AMBIEN) 5 MG tablet Take 5 mg by mouth at bedtime as needed. For sleep       No current facility-administered medications for this visit.     Past Surgical History  Procedure Laterality Date  . Breast biopsy      left, [facility]  . Cervical conization w/bx  08/11/2011    Procedure: CONIZATION CERVIX WITH BIOPSY;  Surgeon: Lazaro Arms, MD;  Location: [facility];  Service: Gynecology;  Laterality: N/A;  Laser Conization of Cervix  . Breast excisional biopsy      Left x2  . Tee without cardioversion  06/19/2012    Procedure: TRANSESOPHAGEAL ECHOCARDIOGRAM (TEE);  Surgeon: Vesta Mixer, MD;  Location[facility] END[facility]COPY;  Service: Cardiovascular;  Laterality: N/A;  . Cardioversion  06/19/2012    Procedure: CARDIOVERSION;  Surgeon: Vesta Mixer, MD;  Location: [facility] ENDOSCOPY;  Service: Cardiovascular;;  . Cardioversion  06/21/2012    Procedure: CARDIOVERSION;  Surgeon: Cassell Clement, MD;  Location: [facility] OR;  Service: Cardiovascular;  Laterality: N/A;     No Known Allergies    Family History  Problem Relation Age of Onset  . Anesthesia problems Neg Hx   . Hypotension Neg Hx   . Malignant hyperthermia Neg Hx   . Pseudochol deficiency Neg Hx   . Cancer Father     carcinoma of the lung  . Cancer Sister     carcinoma of the lung  . Colon cancer Neg Hx    . Cervical cancer Mother   . Irritable bowel syndrome      mother, sister  . Celiac disease Neg Hx   . Inflammatory bowel disease Neg Hx      Social History Ms. Whitling reports that she has never smoked. She has never used smokeless tobacco. Ms. Ringley reports that she does not drink alcohol.   Review of Systems CONSTITUTIONAL: No weight loss, fever, chills, weakness or fatigue.  HEENT: Eyes: No visual loss, blurred vision, double vision or yellow sclerae.No hearing loss, sneezing, congestion, runny nose or sore throat.  SKIN: No rash or itching.  CARDIOVASCULAR: per HPI RESPIRATORY: No shortness of breath, cough or sputum.  GASTROINTESTINAL: No anorexia, nausea, vomiting or diarrhea. No abdominal pain or blood.  GENITOURINARY: No burning on urination, no polyuria NEUROLOGICAL: No headache, dizziness, syncope, paralysis, ataxia, numbness or tingling in the extremities. No change in bowel or bladder control.  MUSCULOSKELETAL: No muscle, back pain, joint pain or stiffness.  LYMPHATICS: No enlarged nodes. No history of splenectomy.  PSYCHIATRIC: No history of depression or anxiety.  ENDOCRINOLOGIC: No reports of sweating, cold or heat intolerance. No polyuria or polydipsia.  Marland Kitchen   Physical Examination p 75 bp 98/54 Wt 135 lbs BMI 23 Gen: resting comfortably, no acute distress HEENT: no scleral icterus, pupils equal round and reactive, no palptable cervical adenopathy,  CV: RRR, no m/r/g, no JVD, no carotid bruits Resp: Clear to auscultation bilaterally GI: abdomen is soft, non-tender, non-distended, normal bowel sounds, no hepatosplenomegaly MSK: extremities are warm, no edema.  Skin: warm, no rash Neuro:  no focal deficits Psych: appropriate affect   Diagnostic Studies 06/2012 TEE: mod to severe MR, milld to mod TR   06/12/12 Cath  Hemodynamics:  AO 118/68 (83)  LV 119/8  Cannot calculate gradient due to rapid atrial fib  Coronary angiography:  Coronary dominance: right   Left mainstem: Short without significant disease  Left anterior descending (LAD): There is calcification proximally. There are two major diagonal branches. Between the first and second diagonal branches are approximately 30-40% plaquing. The distal LAD has mild irregularity. Both diagonals have minimal irregularity.  Left circumflex (LCx): Provides one large bifurcation marginal Betty Daidone and a small AV circumflex. There is minor distal irregularity. The AV circ is small  Right coronary artery (RCA): Provides a large PDA and a large and smaller PLA branches. No significant focal obstruction.  Left ventriculography: Due to ectopy, EF cannot be calculated. With AF, difficult to be sure of function but appears 35%. There does appear to be some MR.  Final Conclusions:  1. Atrial fib with rapid ventricular response.  2.  Mild calcification of proximal LAD without critical obstruction. No high grade coronary artery disease.   09/2012 Echo: LVEF 25%, severely dilated LV, mild LVH, mild AI, mild MR   09/26/13 Clinic EKG  Afib, V-paced     Assessment and Plan   1. NICM  - LVEF 25% by echo 09/2012, NYHA 2. She is euvolemic today in clinic  - she has reached her optimal dosing for corer, bp limits any further titration of other meds at this time. Continue current meds - will repeat echo She has a history of a BiV pacemaker after her AV nodal ablation with the LV lead turned off due to diaphragmatic stim, if persistent LV systolic dysfunction will ask EP to consider BiV AICD.    2. Afib  - hx of av nodal ablation with pacemaker  - denies any symptoms, pacer with normal function at last check  - continue xarelto for stroke prophylaxis.   3. Mitral regurgitation  - moderate to severe by TEE 06/2012, recent TTE 09/2012 describes only mild. No current symptoms, likely functional MR related to her LV systolic dysfunction.  - continue to follow clinically.    F/u 3 months      Antoine PocheJonathan F. Edit Ricciardelli,  M.D., F.A.C.C.

## 2013-11-08 ENCOUNTER — Other Ambulatory Visit: Payer: Self-pay

## 2013-11-08 ENCOUNTER — Other Ambulatory Visit (INDEPENDENT_AMBULATORY_CARE_PROVIDER_SITE_OTHER): Payer: Self-pay

## 2013-11-08 DIAGNOSIS — I059 Rheumatic mitral valve disease, unspecified: Secondary | ICD-10-CM

## 2013-11-08 DIAGNOSIS — I4891 Unspecified atrial fibrillation: Secondary | ICD-10-CM

## 2013-11-08 DIAGNOSIS — I5022 Chronic systolic (congestive) heart failure: Secondary | ICD-10-CM

## 2013-11-09 ENCOUNTER — Ambulatory Visit (INDEPENDENT_AMBULATORY_CARE_PROVIDER_SITE_OTHER): Payer: PRIVATE HEALTH INSURANCE | Admitting: *Deleted

## 2013-11-09 DIAGNOSIS — I428 Other cardiomyopathies: Secondary | ICD-10-CM

## 2013-11-09 DIAGNOSIS — I5022 Chronic systolic (congestive) heart failure: Secondary | ICD-10-CM

## 2013-11-09 DIAGNOSIS — I4891 Unspecified atrial fibrillation: Secondary | ICD-10-CM

## 2013-11-09 LAB — MDC_IDC_ENUM_SESS_TYPE_INCLINIC
Battery Remaining Longevity: 61 mo
Battery Voltage: 3 V
Brady Statistic AP VP Percent: 0 %
Brady Statistic RA Percent Paced: 0 %
Brady Statistic RV Percent Paced: 97.88 %
Lead Channel Impedance Value: 342 Ohm
Lead Channel Impedance Value: 475 Ohm
Lead Channel Pacing Threshold Amplitude: 0.5 V
Lead Channel Pacing Threshold Pulse Width: 0.4 ms
Lead Channel Sensing Intrinsic Amplitude: 0.5 mV
Lead Channel Sensing Intrinsic Amplitude: 11 mV
Lead Channel Sensing Intrinsic Amplitude: 8.125 mV
Lead Channel Setting Pacing Amplitude: 2 V
Lead Channel Setting Pacing Pulse Width: 0.4 ms
MDC IDC MSMT LEADCHNL LV IMPEDANCE VALUE: 494 Ohm
MDC IDC MSMT LEADCHNL LV IMPEDANCE VALUE: 513 Ohm
MDC IDC MSMT LEADCHNL LV IMPEDANCE VALUE: 703 Ohm
MDC IDC MSMT LEADCHNL LV IMPEDANCE VALUE: 703 Ohm
MDC IDC MSMT LEADCHNL LV IMPEDANCE VALUE: 836 Ohm
MDC IDC MSMT LEADCHNL RA IMPEDANCE VALUE: 456 Ohm
MDC IDC MSMT LEADCHNL RA SENSING INTR AMPL: 0.75 mV
MDC IDC MSMT LEADCHNL RV IMPEDANCE VALUE: 646 Ohm
MDC IDC SESS DTM: 20150410133128
MDC IDC SET LEADCHNL RV SENSING SENSITIVITY: 2.8 mV
MDC IDC SET ZONE DETECTION INTERVAL: 350 ms
MDC IDC SET ZONE DETECTION INTERVAL: 400 ms
MDC IDC STAT BRADY AP VS PERCENT: 0 %
MDC IDC STAT BRADY AS VP PERCENT: 97.88 %
MDC IDC STAT BRADY AS VS PERCENT: 2.12 %

## 2013-11-09 NOTE — Progress Notes (Signed)
PPM check in office. 

## 2013-11-15 ENCOUNTER — Telehealth: Payer: Self-pay | Admitting: Cardiology

## 2013-11-15 NOTE — Telephone Encounter (Signed)
Message copied by Burnice Logan on Thu Nov 15, 2013 10:48 AM ------      Message from: [place] F      Created: Mon Nov 12, 2013  9:39 AM       Please let patient know that overall her heart function remains weak. I have contacted Dr Ladona Ridgel to see if she needs to have her pacemaker changed to an ICD, we should know shortly. Otherwise we will continue current meds                  Dina Rich MD ------

## 2013-11-15 NOTE — Telephone Encounter (Signed)
LM for pt to returncall

## 2013-11-16 ENCOUNTER — Other Ambulatory Visit: Payer: Self-pay

## 2013-11-16 MED ORDER — LISINOPRIL 10 MG PO TABS: 10.0000 mg | ORAL_TABLET | Freq: Every day | ORAL | Status: DC

## 2013-11-16 NOTE — Telephone Encounter (Signed)
LM for pt to returncall

## 2013-11-16 NOTE — Telephone Encounter (Signed)
Message copied by Burnice Logan on Fri Nov 16, 2013 10:26 AM ------      Message from: [place] F      Created: Mon Nov 12, 2013  9:39 AM       Please let patient know that overall her heart function remains weak. I have contacted Dr Ladona Ridgel to see if she needs to have her pacemaker changed to an ICD, we should know shortly. Otherwise we will continue current meds                  Dina Rich MD ------

## 2013-11-19 NOTE — Telephone Encounter (Signed)
Message copied by Burnice Logan on Mon Nov 19, 2013  4:50 PM ------      Message from: [place] F      Created: Mon Nov 12, 2013  9:39 AM       Please let patient know that overall her heart function remains weak. I have contacted Dr Ladona Ridgel to see if she needs to have her pacemaker changed to an ICD, we should know shortly. Otherwise we will continue current meds                  Dina Rich MD ------

## 2013-11-19 NOTE — Telephone Encounter (Signed)
Pt informed of below and verbalized understanding. I informed pt that someone will be contacting her about her pacemaker.

## 2013-12-03 IMAGING — CR DG CHEST 1V PORT
1 series · 1 of 1 positions shown · non-contrast
Comparison: Portable exam 0017 hours compared to 06/04/2012

CLINICAL DATA: CHF

PORTABLE CHEST - 1 VIEW

[view not recorded]
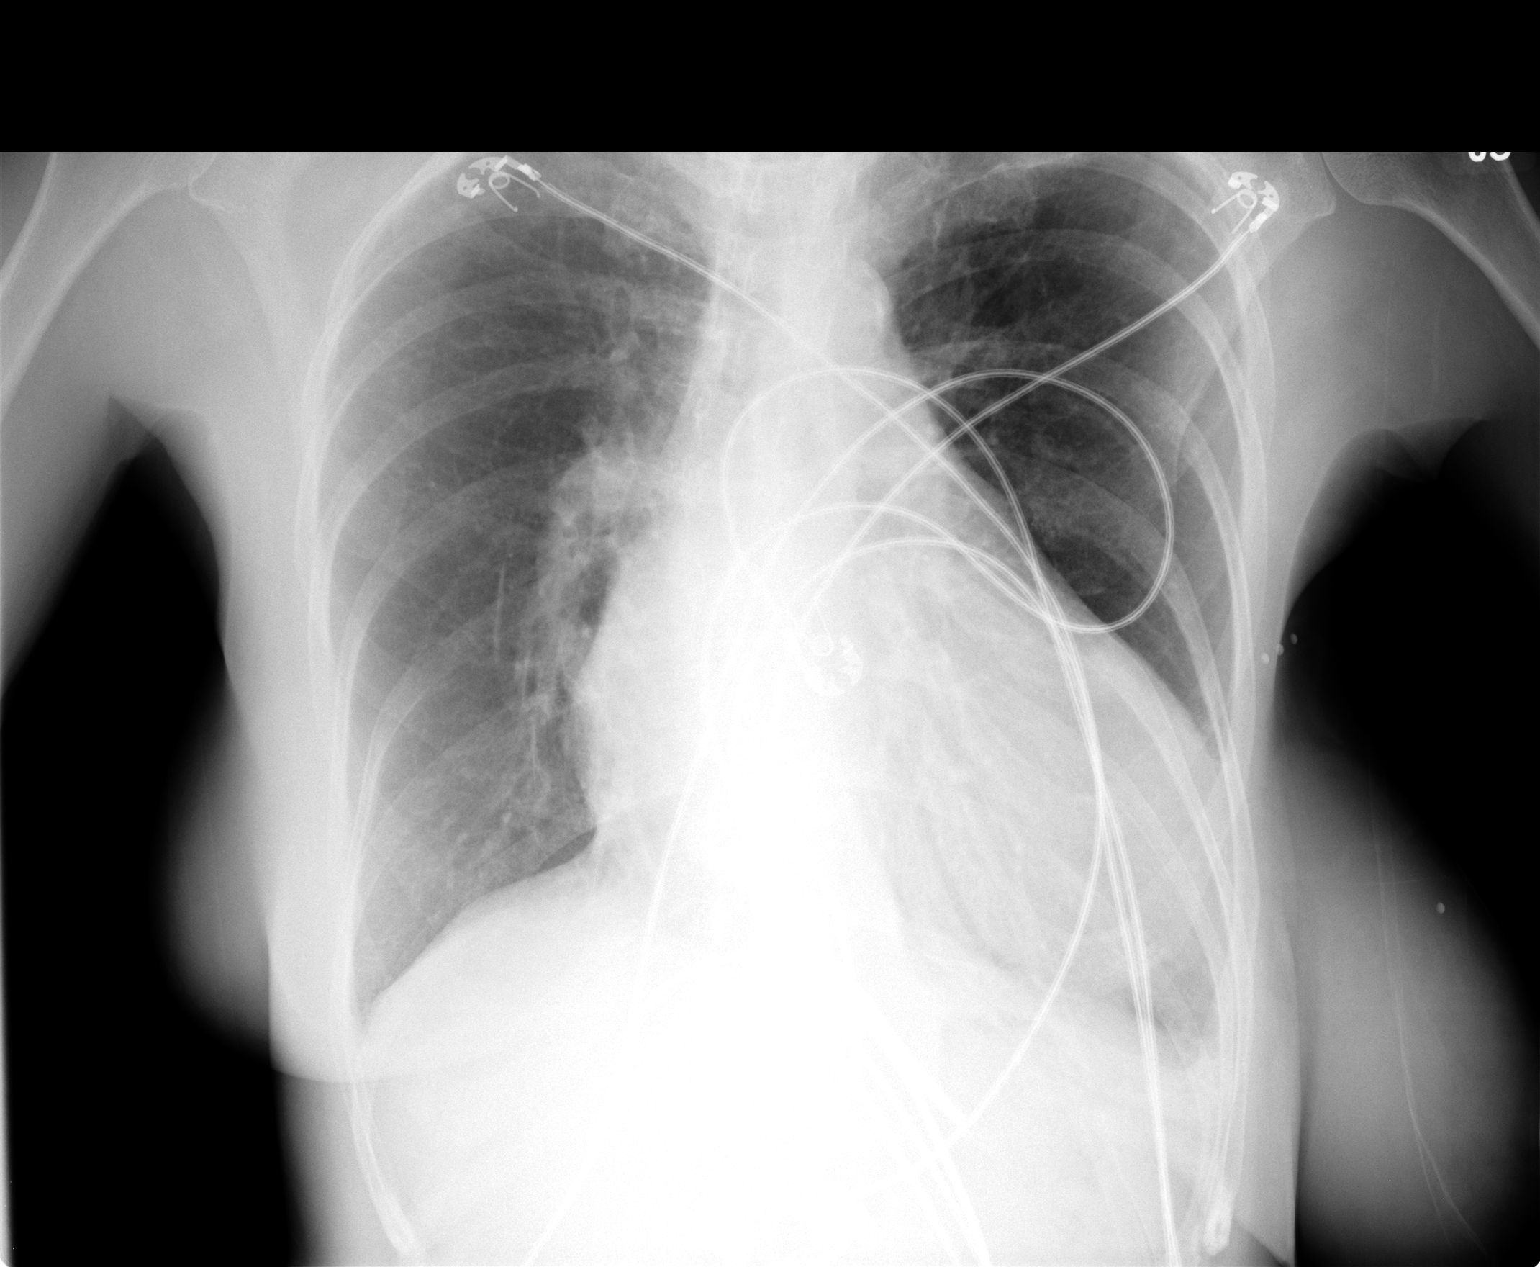

[1 of 1 positions shown; findings below may reference images not displayed]

FINDINGS: Marked enlargement of cardiac silhouette.
Decreased pulmonary vascular congestion.
Resolution of pulmonary edema seen on previous exam.
Tiny residual left pleural effusion.
Lungs otherwise clear.
No pneumothorax.
Bones demineralized.
IMPRESSION: Resolved CHF.

## 2013-12-07 ENCOUNTER — Encounter: Payer: Self-pay | Admitting: Internal Medicine

## 2013-12-17 ENCOUNTER — Other Ambulatory Visit: Payer: Self-pay | Admitting: *Deleted

## 2013-12-17 MED ORDER — RIVAROXABAN 20 MG PO TABS: 20.0000 mg | ORAL_TABLET | Freq: Every day | ORAL | Status: DC

## 2014-01-23 ENCOUNTER — Telehealth: Payer: Self-pay | Admitting: Cardiology

## 2014-01-23 NOTE — Telephone Encounter (Signed)
Please call patient back regarding Xarelto / tgs

## 2014-01-23 NOTE — Telephone Encounter (Signed)
Left message to call back  

## 2014-01-29 ENCOUNTER — Telehealth: Payer: Self-pay

## 2014-01-29 NOTE — Telephone Encounter (Signed)
Per pt request, called to [facility] in [place],  Brain pharmacist, Xarelto 20 mg daily at dinner,#[phone]

## 2014-02-04 ENCOUNTER — Ambulatory Visit: Payer: Self-pay | Admitting: Cardiology

## 2014-02-19 ENCOUNTER — Encounter: Payer: Self-pay | Admitting: Cardiology

## 2014-02-19 ENCOUNTER — Ambulatory Visit (INDEPENDENT_AMBULATORY_CARE_PROVIDER_SITE_OTHER): Payer: Self-pay | Admitting: Cardiology

## 2014-02-19 ENCOUNTER — Other Ambulatory Visit: Payer: Self-pay | Admitting: Cardiology

## 2014-02-19 VITALS — BP 94/59 | HR 73 | Ht 64.0 in | Wt 136.0 lb

## 2014-02-19 DIAGNOSIS — Z79899 Other long term (current) drug therapy: Secondary | ICD-10-CM

## 2014-02-19 DIAGNOSIS — I340 Nonrheumatic mitral (valve) insufficiency: Secondary | ICD-10-CM

## 2014-02-19 DIAGNOSIS — I5022 Chronic systolic (congestive) heart failure: Secondary | ICD-10-CM

## 2014-02-19 DIAGNOSIS — I4891 Unspecified atrial fibrillation: Secondary | ICD-10-CM

## 2014-02-19 DIAGNOSIS — I059 Rheumatic mitral valve disease, unspecified: Secondary | ICD-10-CM

## 2014-02-19 LAB — CBC
HCT: 33 % — ABNORMAL LOW (ref 36.0–46.0)
Hemoglobin: 11.2 g/dL — ABNORMAL LOW (ref 12.0–15.0)
MCH: 30.7 pg (ref 26.0–34.0)
MCHC: 33.9 g/dL (ref 30.0–36.0)
MCV: 90.4 fL (ref 78.0–100.0)
PLATELETS: 191 10*3/uL (ref 150–400)
RBC: 3.65 MIL/uL — ABNORMAL LOW (ref 3.87–5.11)
RDW: 14.5 % (ref 11.5–15.5)
WBC: 5.4 10*3/uL (ref 4.0–10.5)

## 2014-02-19 LAB — BASIC METABOLIC PANEL
BUN: 24 mg/dL — ABNORMAL HIGH (ref 6–23)
CALCIUM: 9.4 mg/dL (ref 8.4–10.5)
CO2: 24 mEq/L (ref 19–32)
CREATININE: 1.19 mg/dL — AB (ref 0.50–1.10)
Chloride: 105 mEq/L (ref 96–112)
Glucose, Bld: 79 mg/dL (ref 70–99)
Potassium: 4.3 mEq/L (ref 3.5–5.3)
Sodium: 140 mEq/L (ref 135–145)

## 2014-02-19 NOTE — Progress Notes (Signed)
Patient ID: TANNIS DYMENT, female   DOB: 10-Aug-1955, 58 y.o.   MRN: [id number]      Clinical Summary Ms. Gaccione is a 58 y.o.female seen today for follow up of the following medical problems.   1. NICM  - echo 10/2013 LVEF 30-35%, restrictive diastolic dysfunction - cath Jan 2011 showed no obstructive CAD  - denies any significant SOB, no chest pain, no orthopnea, no LE edema.  - limiting sodium intake, takes NSAIDs rarely  - compliant with meds   2. Chronic afib  - prior AV nodal ablation with permanent pacemaker  - biventricular pacer, however LV lead off due to diaphgramatic stimulation per notes  - normal device check at her EP visit 10/2013 - no palpitations, no lightheadedness or dizziness. No bleeding troubles on rivaroxaban  3. Moderate MR - stable from last echo, no significant symptoms    Past Medical History  Diagnosis Date  . COPD (chronic obstructive pulmonary disease)   . Anxiety and depression   . Atrial fibrillation     Echocardiogram in 2004-borderline LV function; no valvular abnormalities; 06/2012: AV nodal ablation + biventricular pacemaker  . Anemia     2011: Hemoglobin of 11.4; hematocrit of 33.5; normal MCV  . Cardiac arrest 06/2012    16 day hospitalization; caused by hyperkalemia; multiple complications including CHF; permanent atrial fibrillation requiring AV nodal ablation + pacing  . Thyroid disease   . Nonischemic cardiomyopathy     Echo 06/2012: EF 30-35%, moderate to severe MR; cardiac cath 06/2012:30-40% D1 and D2, estimated EF-35%, MR-not graded; right bundle Ameisha Mcclellan block     No Known Allergies   Current Outpatient Prescriptions  Medication Sig Dispense Refill  . ALPRAZolam (XANAX) 0.25 MG tablet Take 0.5 mg by mouth at bedtime as needed. For anxiety      . calcium carbonate (TUMS - DOSED IN MG ELEMENTAL CALCIUM) 500 MG chewable tablet Chew 2 tablets by mouth 3 (three) times daily.      . carvedilol (COREG) 25 MG tablet Take 1 tablet (25  mg total) by mouth 2 (two) times daily.  60 tablet  6  . furosemide (LASIX) 40 MG tablet Take 20 mg by mouth daily.      Marland Kitchen lisinopril (PRINIVIL,ZESTRIL) 10 MG tablet Take 1 tablet (10 mg total) by mouth daily.  30 tablet  3  . pantoprazole (PROTONIX) 40 MG tablet Take 40 mg by mouth daily.      . rivaroxaban (XARELTO) 20 MG TABS tablet Take 1 tablet (20 mg total) by mouth daily with supper.  30 tablet  0  . sertraline (ZOLOFT) 50 MG tablet Take 50 mg by mouth daily.      Marland Kitchen SPIRIVA HANDIHALER 18 MCG inhalation capsule Place 18 mcg into inhaler and inhale daily.       Marland Kitchen zolpidem (AMBIEN) 5 MG tablet Take 5 mg by mouth at bedtime as needed. For sleep       No current facility-administered medications for this visit.     Past Surgical History  Procedure Laterality Date  . Breast biopsy      left, [facility]  . Cervical conization w/bx  08/11/2011    Procedure: CONIZATION CERVIX WITH BIOPSY;  Surgeon: Lazaro Arms, MD;  Location: [facility];  Service: Gynecology;  Laterality: N/A;  Laser Conization of Cervix  . Breast excisional biopsy      Left x2  . Tee without cardioversion  06/19/2012    Procedure: TRANSESOPHAGEAL ECHOCARDIOGRAM (TEE);  Surgeon: Loistine Chance  Earnstine RegalJ Nahser, MD;  Location: [facility];  Service: Cardiovascular;  Laterality: N/A;  . Cardioversion  06/19/2012    Procedure: CARDIOVERSION;  Surgeon: Vesta MixerPhilip J Nahser, MD;  [facility];  Service: Cardiovascular;;  . Cardioversion  06/21/2012    Procedure: CARDIOVERSION;  Surgeon: Cassell Clementhomas Brackbill, MD;  [facility];  Service: Cardiovascular;  Laterality: N/A;     No Known Allergies    Family History  Problem Relation Age of Onset  . Anesthesia problems Neg Hx   . Hypotension Neg Hx   . Malignant hyperthermia Neg Hx   . Pseudochol deficiency Neg Hx   . Cancer Father     carcinoma of the lung  . Cancer Sister     carcinoma of the lung  . Colon cancer Neg Hx   . Cervical cancer Mother   . Irritable bowel syndrome       mother, sister  . Celiac disease Neg Hx   . Inflammatory bowel disease Neg Hx      Social History Ms. Manson PasseyBrown reports that she has never smoked. She has never used smokeless tobacco. Ms. Manson PasseyBrown reports that she does not drink alcohol.   Review of Systems CONSTITUTIONAL: No weight loss, fever, chills, weakness or fatigue.  HEENT: Eyes: No visual loss, blurred vision, double vision or yellow sclerae.No hearing loss, sneezing, congestion, runny nose or sore throat.  SKIN: No rash or itching.  CARDIOVASCULAR: per HPI RESPIRATORY: No shortness of breath, cough or sputum.  GASTROINTESTINAL: No anorexia, nausea, vomiting or diarrhea. No abdominal pain or blood.  GENITOURINARY: No burning on urination, no polyuria NEUROLOGICAL: No headache, dizziness, syncope, paralysis, ataxia, numbness or tingling in the extremities. No change in bowel or bladder control.  MUSCULOSKELETAL: No muscle, back pain, joint pain or stiffness.  LYMPHATICS: No enlarged nodes. No history of splenectomy.  PSYCHIATRIC: No history of depression or anxiety.  ENDOCRINOLOGIC: No reports of sweating, cold or heat intolerance. No polyuria or polydipsia.  Marland Kitchen.   Physical Examination p 73 bp 94/59 Wt 136 lbs BMI 23 Gen: resting comfortably, no acute distress HEENT: no scleral icterus, pupils equal round and reactive, no palptable cervical adenopathy,  CV: RRR, 2/6 systolic murmur at apex Resp: Clear to auscultation bilaterally GI: abdomen is soft, non-tender, non-distended, normal bowel sounds, no hepatosplenomegaly MSK: extremities are warm, no edema.  Skin: warm, no rash Neuro:  no focal deficits Psych: appropriate affect   Diagnostic Studies  06/2012 TEE: mod to severe MR, milld to mod TR   06/12/12 Cath  Hemodynamics:  AO 118/68 (83)  LV 119/8  Cannot calculate gradient due to rapid atrial fib  Coronary angiography:  Coronary dominance: right  Left mainstem: Short without significant disease  Left anterior  descending (LAD): There is calcification proximally. There are two major diagonal branches. Between the first and second diagonal branches are approximately 30-40% plaquing. The distal LAD has mild irregularity. Both diagonals have minimal irregularity.  Left circumflex (LCx): Provides one large bifurcation marginal Amelia Macken and a small AV circumflex. There is minor distal irregularity. The AV circ is small  Right coronary artery (RCA): Provides a large PDA and a large and smaller PLA branches. No significant focal obstruction.  Left ventriculography: Due to ectopy, EF cannot be calculated. With AF, difficult to be sure of function but appears 35%. There does appear to be some MR.  Final Conclusions:  1. Atrial fib with rapid ventricular response.  2. Mild calcification of proximal LAD without critical obstruction. No high grade coronary  artery disease.  09/2012 Echo: LVEF 25%, severely dilated LV, mild LVH, mild AI, mild MR   09/26/13 Clinic EKG  Afib, V-paced  11/08/13 Echo Study Conclusions  - Left ventricle: The cavity size was moderately dilated. Wall thickness was increased in a pattern of mild LVH. Systolic function was moderately to severely reduced. The estimated ejection fraction was in the range of 30% to 35%. Diffuse hypokinesis. There is severe hypokinesis of the anteroseptal myocardium. Doppler parameters are consistent with restrictive physiology, indicative of decreased left ventricular diastolic compliance and/or increased left atrial pressure. - Ventricular septum: Septal motion showed abnormal function and dyssynergy. - Aortic valve: Trileaflet; mildly thickened leaflets. Mild to moderate regurgitation. - Mitral valve: Mildly thickened leaflets . Moderate regurgitation, appears to be made up of two jets. Suboptimal PISA. - Left atrium: The atrium was severely dilated. - Right ventricle: The cavity size was mildly dilated. Pacer wire or catheter noted in right  ventricle. - Right atrium: The atrium was moderately to severely dilated. - Tricuspid valve: Mild-moderate regurgitation. - Pulmonic valve: Mild regurgitation. - Pulmonary arteries: Systolic pressure was moderately increased. - Pericardium, extracardiac: There was no pericardial effusion. Impressions:  - Mild LVH with moderate chamber dilatation and LVEF 30-35% with diffuse hypokinesis, most prominent anteroseptal wall in setting of septal dyssynergy and pacing. Restrictive diastolic filling pattern with increased filling pressures. Severe left atrial and moderate to severe right atrial enlargement. MIldly thickened mitral valve with overall moderate mitral regurgitation made up of two jets (PISA suboptimal). Mild to moderate aortic regurgitation. Device wire in right heart. MIld to moderate tricuspid regurgitation with PASP 51 mmHg.    Assessment and Plan   1. NICM  - LVEF 30-35% by echo 10/2013, NYHA 2. She is euvolemic today in clinic  - she has reached her optimal dosing for coreg, bp limits any further titration of other meds at this time. Continue current meds  - She has a history of a BiV pacemaker after her AV nodal ablation with the LV lead turned off due to diaphragmatic stim. Her LVEF has not improved despite medical therapy and will refer to Dr Ladona Ridgel for ICD placement, with potential consideration BiV AICD given her pacemaker dependence from AV node ablation and chronic systolic heart failure.  - repeat BMET on lasix and lisnopril  2. Afib  - hx of av nodal ablation with pacemaker  - denies any symptoms, pacer with normal function at last check  - continue xarelto for stroke prophylaxis.  - repeat CBC on xarelto  3. Mitral regurgitation  - moderate to severe by TEE 06/2012, follow up TTE's have been stable mild to moderate.  No current symptoms, likely functional MR related to her LV systolic dysfunction.  - continue to follow clinically.    F/u 6  months  [name], M.D., F.A.C.C.

## 2014-02-19 NOTE — Patient Instructions (Signed)
   Referral to Dr. Ladona Ridgel for evaluation of defibrillator  Labs for BMET. CBC Office will contact with results via phone or letter.   Continue all current medications. Your physician wants you to follow up in: 6 months.  You will receive a reminder letter in the mail one-two months in advance.  If you don't receive a letter, please call our office to schedule the follow up appointment

## 2014-03-22 ENCOUNTER — Other Ambulatory Visit: Payer: Self-pay

## 2014-03-22 MED ORDER — LISINOPRIL 10 MG PO TABS: 10.0000 mg | ORAL_TABLET | Freq: Every day | ORAL | Status: DC

## 2014-05-03 ENCOUNTER — Encounter: Payer: Self-pay | Admitting: Internal Medicine

## 2014-05-03 ENCOUNTER — Ambulatory Visit (INDEPENDENT_AMBULATORY_CARE_PROVIDER_SITE_OTHER): Payer: Self-pay | Admitting: Internal Medicine

## 2014-05-03 VITALS — BP 107/65 | HR 86 | Ht 64.0 in | Wt 143.4 lb

## 2014-05-03 DIAGNOSIS — Z950 Presence of cardiac pacemaker: Secondary | ICD-10-CM

## 2014-05-03 DIAGNOSIS — I4891 Unspecified atrial fibrillation: Secondary | ICD-10-CM

## 2014-05-03 DIAGNOSIS — I429 Cardiomyopathy, unspecified: Secondary | ICD-10-CM

## 2014-05-03 DIAGNOSIS — I428 Other cardiomyopathies: Secondary | ICD-10-CM

## 2014-05-03 DIAGNOSIS — I5022 Chronic systolic (congestive) heart failure: Secondary | ICD-10-CM

## 2014-05-03 LAB — MDC_IDC_ENUM_SESS_TYPE_INCLINIC
Brady Statistic AP VP Percent: 0 %
Brady Statistic AP VS Percent: 0 %
Brady Statistic AS VP Percent: 93.03 %
Brady Statistic RA Percent Paced: 0 %
Lead Channel Impedance Value: 475 Ohm
Lead Channel Impedance Value: 475 Ohm
Lead Channel Impedance Value: 494 Ohm
Lead Channel Impedance Value: 684 Ohm
Lead Channel Impedance Value: 722 Ohm
Lead Channel Impedance Value: 817 Ohm
Lead Channel Pacing Threshold Pulse Width: 0.4 ms
Lead Channel Sensing Intrinsic Amplitude: 0.625 mV
Lead Channel Sensing Intrinsic Amplitude: 1.125 mV
Lead Channel Setting Pacing Amplitude: 2 V
Lead Channel Setting Pacing Pulse Width: 0.4 ms
Lead Channel Setting Sensing Sensitivity: 2.8 mV
MDC IDC MSMT BATTERY REMAINING LONGEVITY: 56 mo
MDC IDC MSMT BATTERY VOLTAGE: 3 V
MDC IDC MSMT LEADCHNL RA IMPEDANCE VALUE: 361 Ohm
MDC IDC MSMT LEADCHNL RV IMPEDANCE VALUE: 475 Ohm
MDC IDC MSMT LEADCHNL RV IMPEDANCE VALUE: 646 Ohm
MDC IDC MSMT LEADCHNL RV PACING THRESHOLD AMPLITUDE: 0.5 V
MDC IDC MSMT LEADCHNL RV SENSING INTR AMPL: 6.5 mV
MDC IDC MSMT LEADCHNL RV SENSING INTR AMPL: 9 mV
MDC IDC SESS DTM: 20151002114237
MDC IDC SET ZONE DETECTION INTERVAL: 400 ms
MDC IDC STAT BRADY AS VS PERCENT: 6.97 %
MDC IDC STAT BRADY RV PERCENT PACED: 93.03 %
Zone Setting Detection Interval: 350 ms

## 2014-05-03 LAB — PACEMAKER DEVICE OBSERVATION

## 2014-05-03 MED ORDER — CARVEDILOL 25 MG PO TABS: 25.0000 mg | ORAL_TABLET | Freq: Two times a day (BID) | ORAL | Status: DC

## 2014-05-03 NOTE — Assessment & Plan Note (Signed)
Her symptoms are stable but she is still class 2B. She will likely worsen. I have recommended LV lead insertion. Her terrible anatomy is not conducive to endocardial placement. I will refer the patient to Dr. Laneta Simmers.

## 2014-05-03 NOTE — Assessment & Plan Note (Signed)
Her ventricular rate is now controlled. No change in meds.

## 2014-05-03 NOTE — Patient Instructions (Addendum)
Your physician wants you to follow-up in: 6 months with Gunnar Fusi and 3 months with Dr. Court Joy will receive a reminder letter in the mail two months in advance. If you don't receive a letter, please call our office to schedule the follow-up appointment.  Your physician recommends that you continue on your current medications as directed. Please refer to the Current Medication list given to you today.  I have refilled your carvedilol  You have been referred to Dr. Laneta Simmers  Thank you for choosing [facility]!!

## 2014-05-03 NOTE — Progress Notes (Signed)
HPI [name] [name] returns today for followup. She is a pleasant 58 yo woman with a non-ischemic CM, EF 30-35%, chronic atrial fib with an RVR, s/p AV node ablation and PPM insertion. At the time of her PPM insertion, she had diaphragmatic stimulation and her LV lead was turned off. In the interim, she has had gradual worsening sob and CHF symptoms. She denies dietary indiscretion and her EF has gone down with RV apical pacing.  No Known Allergies   Current Outpatient Prescriptions  Medication Sig Dispense Refill  . calcium carbonate (TUMS - DOSED IN MG ELEMENTAL CALCIUM) 500 MG chewable tablet Chew 2 tablets by mouth 3 (three) times daily.      . carvedilol (COREG) 25 MG tablet Take 1 tablet (25 mg total) by mouth 2 (two) times daily.  60 tablet  6  . furosemide (LASIX) 40 MG tablet Take 20 mg by mouth daily.      Marland Kitchen lisinopril (PRINIVIL,ZESTRIL) 10 MG tablet Take 1 tablet (10 mg total) by mouth daily.  30 tablet  6  . pantoprazole (PROTONIX) 40 MG tablet Take 40 mg by mouth daily.      . rivaroxaban (XARELTO) 20 MG TABS tablet Take 1 tablet (20 mg total) by mouth daily with supper.  30 tablet  0  . sertraline (ZOLOFT) 50 MG tablet Take 50 mg by mouth daily.      Marland Kitchen SPIRIVA HANDIHALER 18 MCG inhalation capsule Place 18 mcg into inhaler and inhale daily.       Marland Kitchen zolpidem (AMBIEN) 5 MG tablet Take 5 mg by mouth at bedtime as needed. For sleep       No current facility-administered medications for this visit.     Past Medical History  Diagnosis Date  . COPD (chronic obstructive pulmonary disease)   . Anxiety and depression   . Atrial fibrillation     Echocardiogram in 2004-borderline LV function; no valvular abnormalities; 06/2012: AV nodal ablation + biventricular pacemaker  . Anemia     2011: Hemoglobin of 11.4; hematocrit of 33.5; normal MCV  . Cardiac arrest 06/2012    16 day hospitalization; caused by hyperkalemia; multiple complications including CHF; permanent atrial fibrillation  requiring AV nodal ablation + pacing  . Thyroid disease   . Nonischemic cardiomyopathy     Echo 06/2012: EF 30-35%, moderate to severe MR; cardiac cath 06/2012:30-40% D1 and D2, estimated EF-35%, MR-not graded; right bundle branch block    ROS:   All systems reviewed and negative except as noted in the HPI.   Past Surgical History  Procedure Laterality Date  . Breast biopsy      left, [facility]  . Cervical conization w/bx  08/11/2011    Procedure: CONIZATION CERVIX WITH BIOPSY;  Surgeon: Lazaro Arms, MD;  Location: [facility];  Service: Gynecology;  Laterality: N/A;  Laser Conization of Cervix  . Breast excisional biopsy      Left x2  . Tee without cardioversion  06/19/2012    Procedure: TRANSESOPHAGEAL ECHOCARDIOGRAM (TEE);  Surgeon: Vesta Mixer, MD;  Location[facility] END[facility]COPY;  Service: Cardiovascular;  Laterality: N/A;  . Cardioversion  06/19/2012    Procedure: CARDIOVERSION;  Surgeon: Vesta Mixer, MD;  Location: [facility] ENDOSCOPY;  Service: Cardiovascular;;  . Cardioversion  06/21/2012    Procedure: CARDIOVERSION;  Surgeon: Cassell Clement, MD;  Location: [facility] OR;  Service: Cardiovascular;  Laterality: N/A;     Family History  Problem Relation Age of Onset  . Anesthesia problems Neg Hx   .  Hypotension Neg Hx   . Malignant hyperthermia Neg Hx   . Pseudochol deficiency Neg Hx   . Cancer Father     carcinoma of the lung  . Cancer Sister     carcinoma of the lung  . Colon cancer Neg Hx   . Cervical cancer Mother   . Irritable bowel syndrome      mother, sister  . Celiac disease Neg Hx   . Inflammatory bowel disease Neg Hx      History   Social History  . Marital Status: Divorced    Spouse Name: N/A    Number of Children: 2  . Years of Education: N/A   Occupational History  . Surveyor, quantity  . 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 Smoking status: Never Smoker   . Smokeless tobacco: Never Used  . Alcohol Use: No  . Drug Use: No   . Sexual Activity: Not on file   Other Topics Concern  . Not on file   Social History Narrative   ** Merged History Encounter **       ** Data from: 06/22/12 Enc Dept: [facility]       ** Data from: 06/05/12 Enc Dept: [facility]   Lives alone with good family support from sisters.            BP 107/65  Pulse 86  Ht 5\' 4"  (1.626 m)  Wt 143 lb 6.4 oz (65.046 kg)  BMI 24.60 kg/m2  Physical Exam:  Well appearing middle aged woman, NAD HEENT: Unremarkable Neck:  7 cm JVD, no thyromegally Back:  No CVA tenderness Lungs:  Clear with no wheezes, rales, or rhonchi. Well healed PPM insertion. HEART:  Regular rate rhythm, no murmurs, no rubs, no clicks Abd:  soft, positive bowel sound,s, no organomegally, no rebound, no guarding Ext:  2 plus pulses, no edema, no cyanosis, no clubbing Skin:  No rashes no nodules Neuro:  CN II through XII intact, motor grossly intact  DEVICE  Normal device function.  See PaceArt for details. LV lead is turned off  Assess/Plan:

## 2014-05-03 NOTE — Assessment & Plan Note (Signed)
Her Medtronic BiV PPM is working normally except her LV lead is off. Will refer for epicardial LV lead.

## 2014-07-11 ENCOUNTER — Encounter (HOSPITAL_COMMUNITY): Payer: Self-pay | Admitting: Cardiology

## 2014-08-19 ENCOUNTER — Ambulatory Visit (INDEPENDENT_AMBULATORY_CARE_PROVIDER_SITE_OTHER): Payer: Self-pay | Admitting: Cardiology

## 2014-08-19 ENCOUNTER — Encounter: Payer: Self-pay | Admitting: Cardiology

## 2014-08-19 ENCOUNTER — Encounter: Payer: Self-pay | Admitting: *Deleted

## 2014-08-19 VITALS — BP 118/62 | HR 78 | Ht 64.0 in | Wt 145.0 lb

## 2014-08-19 DIAGNOSIS — I340 Nonrheumatic mitral (valve) insufficiency: Secondary | ICD-10-CM

## 2014-08-19 DIAGNOSIS — I4891 Unspecified atrial fibrillation: Secondary | ICD-10-CM

## 2014-08-19 DIAGNOSIS — I5022 Chronic systolic (congestive) heart failure: Secondary | ICD-10-CM

## 2014-08-19 DIAGNOSIS — I469 Cardiac arrest, cause unspecified: Secondary | ICD-10-CM

## 2014-08-19 MED ORDER — LISINOPRIL 10 MG PO TABS: 10.0000 mg | ORAL_TABLET | Freq: Every day | ORAL | Status: DC

## 2014-08-19 NOTE — Progress Notes (Signed)
Clinical Summary Ms. Reeder is a 59 y.o.female seen today for follow up of the following medical problems.   1. NICM  - echo 10/2013 LVEF 30-35%, restrictive diastolic dysfunction - cath Jan 2011 showed no obstructive CAD  - limiting sodium intake, takes NSAIDs rarely  - compliant with meds   - denies any SOB or DOE. Denies any LE or edema. Weighs daily,stable in the low 140s.    2. Chronic afib  - prior AV nodal ablation with permanent pacemaker  - biventricular pacer, however LV lead off due to diaphgramatic stimulation per notes  - normal device check at her EP visit 05/2014 - no palpitations, no lightheadedness or dizziness. No bleeding troubles on rivaroxaban   3. Moderate MR - stable from last echo, no significant symptoms  4. Pacemaker - she has a [employer] BiV pacemaker followed by Dr Ladona Ridgel, LV lead turned off due to prior diaphragmatic stimulation. Last EP note mentions possible referral to CT surg for epicardial lead but patient has not heard regarding scheduling.   Past Medical History  Diagnosis Date  . COPD (chronic obstructive pulmonary disease)   . Anxiety and depression   . Atrial fibrillation     Echocardiogram in 2004-borderline LV function; no valvular abnormalities; 06/2012: AV nodal ablation + biventricular pacemaker  . Anemia     2011: Hemoglobin of 11.4; hematocrit of 33.5; normal MCV  . Cardiac arrest 06/2012    16 day hospitalization; caused by hyperkalemia; multiple complications including CHF; permanent atrial fibrillation requiring AV nodal ablation + pacing  . Thyroid disease   . Nonischemic cardiomyopathy     Echo 06/2012: EF 30-35%, moderate to severe MR; cardiac cath 06/2012:30-40% D1 and D2, estimated EF-35%, MR-not graded; right bundle [employer] block     No Known Allergies   Current Outpatient Prescriptions  Medication Sig Dispense Refill  . calcium carbonate (TUMS - DOSED IN MG ELEMENTAL CALCIUM) 500 MG chewable tablet Chew  2 tablets by mouth 3 (three) times daily.    . carvedilol (COREG) 25 MG tablet Take 1 tablet (25 mg total) by mouth 2 (two) times daily. 60 tablet 6  . furosemide (LASIX) 40 MG tablet Take 20 mg by mouth daily.    Marland Kitchen lisinopril (PRINIVIL,ZESTRIL) 10 MG tablet Take 1 tablet (10 mg total) by mouth daily. 30 tablet 6  . pantoprazole (PROTONIX) 40 MG tablet Take 40 mg by mouth daily.    . rivaroxaban (XARELTO) 20 MG TABS tablet Take 1 tablet (20 mg total) by mouth daily with supper. 30 tablet 0  . sertraline (ZOLOFT) 50 MG tablet Take 50 mg by mouth daily.    Marland Kitchen SPIRIVA HANDIHALER 18 MCG inhalation capsule Place 18 mcg into inhaler and inhale daily.     Marland Kitchen zolpidem (AMBIEN) 5 MG tablet Take 5 mg by mouth at bedtime as needed. For sleep     No current facility-administered medications for this visit.     Past Surgical History  Procedure Laterality Date  . Breast biopsy      left, [facility]  . Cervical conization w/bx  08/11/2011    Procedure: CONIZATION CERVIX WITH BIOPSY;  Surgeon: Lazaro Arms, MD;  Location: [facility];  Service: Gynecology;  Laterality: N/A;  Laser Conization of Cervix  . Breast excisional biopsy      Left x2  . Tee without cardioversion  06/19/2012    Procedure: TRANSESOPHAGEAL ECHOCARDIOGRAM (TEE);  Surgeon: Vesta Mixer, MD;  Location: [facility] ENDOSCOPY;  Service: Cardiovascular;  Laterality: N/A;  . Cardioversion  06/19/2012    Procedure: CARDIOVERSION;  Surgeon: Vesta Mixer, MD;  Location: [facility] ENDOSCOPY;  Service: Cardiovascular;;  . Cardioversion  06/21/2012    Procedure: CARDIOVERSION;  Surgeon: Cassell Clement, MD;  Location: [facility] OR;  Service: Cardiovascular;  Laterality: N/A;  . Left heart catheterization with coronary angiogram N/A 06/15/2012    Procedure: LEFT HEART CATHETERIZATION WITH CORONARY ANGIOGRAM;  Surgeon: Herby Abraham, MD;  Location: [facility] CATH LAB;  Service: Cardiovascular;  Laterality: N/A;  . Permanent pacemaker insertion N/A 06/21/2012    Procedure:  PERMANENT PACEMAKER INSERTION;  Surgeon: Marinus Maw, MD;  Location: [facility] CATH LAB;  Service: Cardiovascular;  Laterality: N/A;  . Av node ablation N/A 06/21/2012    Procedure: AV NODE ABLATION;  Surgeon: Marinus Maw, MD;  Location: [facility] CATH LAB;  Service: Cardiovascular;  Laterality: N/A;  . Av node ablation N/A 06/22/2012    Procedure: AV NODE ABLATION;  Surgeon: Duke Salvia, MD;  Location: [facility] CATH LAB;  Service: Cardiovascular;  Laterality: N/A;     No Known Allergies    Family History  Problem Relation Age of Onset  . Anesthesia problems Neg Hx   . Hypotension Neg Hx   . Malignant hyperthermia Neg Hx   . Pseudochol deficiency Neg Hx   . Cancer Father     carcinoma of the lung  . Cancer Sister     carcinoma of the lung  . Colon cancer Neg Hx   . Cervical cancer Mother   . Irritable bowel syndrome      mother, sister  . Celiac disease Neg Hx   . Inflammatory bowel disease Neg Hx      Social History Ms. Norton reports that she has never smoked. She has never used smokeless tobacco. Ms. Haire reports that she does not drink alcohol.   Review of Systems CONSTITUTIONAL: No weight loss, fever, chills, weakness or fatigue.  HEENT: Eyes: No visual loss, blurred vision, double vision or yellow sclerae.No hearing loss, sneezing, congestion, runny nose or sore throat.  SKIN: No rash or itching.  CARDIOVASCULAR: per HPI RESPIRATORY: No shortness of breath, cough or sputum.  GASTROINTESTINAL: No anorexia, nausea, vomiting or diarrhea. No abdominal pain or blood.  GENITOURINARY: No burning on urination, no polyuria NEUROLOGICAL: No headache, dizziness, syncope, paralysis, ataxia, numbness or tingling in the extremities. No change in bowel or bladder control.  MUSCULOSKELETAL: No muscle, back pain, joint pain or stiffness.  LYMPHATICS: No enlarged nodes. No history of splenectomy.  PSYCHIATRIC: No history of depression or anxiety.  ENDOCRINOLOGIC: No reports of sweating, cold  or heat intolerance. No polyuria or polydipsia.  Marland Kitchen   Physical Examination p 78 bp 118/62 Wt 145 lbs BMI 25 Gen: resting comfortably, no acute distress HEENT: no scleral icterus, pupils equal round and reactive, no palptable cervical adenopathy,  CV: RRR, no m/r/g, no JVD Resp: Clear to auscultation bilaterally GI: abdomen is soft, non-tender, non-distended, normal bowel sounds, no hepatosplenomegaly MSK: extremities are warm, no edema.  Skin: warm, no rash Neuro:  no focal deficits Psych: appropriate affect   Diagnostic Studies  06/2012 TEE: mod to severe MR, milld to mod TR   06/12/12 Cath  Hemodynamics:  AO 118/68 (83)  LV 119/8  Cannot calculate gradient due to rapid atrial fib  Coronary angiography:  Coronary dominance: right  Left mainstem: Short without significant disease  Left anterior descending (LAD): There is calcification proximally. There are two major diagonal branches. Between the  first and second diagonal branches are approximately 30-40% plaquing. The distal LAD has mild irregularity. Both diagonals have minimal irregularity.  Left circumflex (LCx): Provides one large bifurcation marginal Lorenza Winkleman and a small AV circumflex. There is minor distal irregularity. The AV circ is small  Right coronary artery (RCA): Provides a large PDA and a large and smaller PLA branches. No significant focal obstruction.  Left ventriculography: Due to ectopy, EF cannot be calculated. With AF, difficult to be sure of function but appears 35%. There does appear to be some MR.  Final Conclusions:  1. Atrial fib with rapid ventricular response.  2. Mild calcification of proximal LAD without critical obstruction. No high grade coronary artery disease.  09/2012 Echo: LVEF 25%, severely dilated LV, mild LVH, mild AI, mild MR   09/26/13 Clinic EKG  Afib, V-paced  11/08/13 Echo Study Conclusions  - Left ventricle: The cavity size was moderately dilated. Wall thickness was  increased in a pattern of mild LVH. Systolic function was moderately to severely reduced. The estimated ejection fraction was in the range of 30% to 35%. Diffuse hypokinesis. There is severe hypokinesis of the anteroseptal myocardium. Doppler parameters are consistent with restrictive physiology, indicative of decreased left ventricular diastolic compliance and/or increased left atrial pressure. - Ventricular septum: Septal motion showed abnormal function and dyssynergy. - Aortic valve: Trileaflet; mildly thickened leaflets. Mild to moderate regurgitation. - Mitral valve: Mildly thickened leaflets . Moderate regurgitation, appears to be made up of two jets. Suboptimal PISA. - Left atrium: The atrium was severely dilated. - Right ventricle: The cavity size was mildly dilated. Pacer wire or catheter noted in right ventricle. - Right atrium: The atrium was moderately to severely dilated. - Tricuspid valve: Mild-moderate regurgitation. - Pulmonic valve: Mild regurgitation. - Pulmonary arteries: Systolic pressure was moderately increased. - Pericardium, extracardiac: There was no pericardial effusion. Impressions:  - Mild LVH with moderate chamber dilatation and LVEF 30-35% with diffuse hypokinesis, most prominent anteroseptal wall in setting of septal dyssynergy and pacing. Restrictive diastolic filling pattern with increased filling pressures. Severe left atrial and moderate to severe right atrial enlargement. MIldly thickened mitral valve with overall moderate mitral regurgitation made up of two jets (PISA suboptimal). Mild to moderate aortic regurgitation. Device wire in right heart. MIld to moderate tricuspid regurgitation with PASP 51 mmHg.   Assessment and Plan  1. NICM/Chronic systolic HF - LVEF 30-35% by echo 10/2013, NYHA 2. She is euvolemic today in clinic  - she has reached her optimal dosing for coreg. Will request most recent labs from Dr Juanetta Gosling, if Cr and K ok  will increase lisionpril.  - She has a history of a BiV pacemaker after her AV nodal ablation with the LV lead turned off due to diaphragmatic stim. Her LVEF has not improved despite medical therapy and has been referred  Dr Ladona Ridgel for consideration of ICD placement, with potential consideration BiV AICD given her pacemaker dependence from AV node ablation and chronic systolic heart failure.    2. Afib  - hx of av nodal ablation with pacemaker  - denies any symptoms, pacer with normal function at last check  - continue xarelto for stroke prophylaxis.    3. Mitral regurgitation  - moderate to severe by TEE 06/2012, follow up TTE's have been stable mild to moderate. No current symptoms, likely functional MR related to her LV systolic dysfunction.  - continue to follow clinically.    4. Pacemaker - will send message to Dr Ladona Ridgel to see if epicardial  lead still needed, and if referral to CT surgery needs to be resent   F/u 3 months. Awaiting labs from Dr Juanetta Gosling, if K and Cr ok likely uptitrate ACE-I  [name], M.D.,

## 2014-08-19 NOTE — Patient Instructions (Signed)
Your physician recommends that you schedule a follow-up appointment in: 3 months with Dr. Wyline Mood  Your physician recommends that you continue on your current medications as directed. Please refer to the Current Medication list given to you today.  WE WILL REQUEST LABS FROM DR. HAWKINS  Thank you for choosing [facility]!!

## 2014-09-25 ENCOUNTER — Encounter: Payer: Self-pay | Admitting: Surgery

## 2014-10-02 ENCOUNTER — Encounter: Payer: Self-pay | Admitting: Surgery

## 2014-10-02 ENCOUNTER — Institutional Professional Consult (permissible substitution) (INDEPENDENT_AMBULATORY_CARE_PROVIDER_SITE_OTHER): Payer: Self-pay | Admitting: Surgery

## 2014-10-02 VITALS — BP 93/57 | HR 84 | Resp 20 | Ht 64.0 in | Wt 140.0 lb

## 2014-10-02 DIAGNOSIS — I5030 Unspecified diastolic (congestive) heart failure: Secondary | ICD-10-CM

## 2014-10-03 ENCOUNTER — Encounter: Payer: Self-pay | Admitting: Surgery

## 2014-10-03 NOTE — Progress Notes (Signed)
Cardiothoracic Surgery Consultation  PCP is Fredirick Maudlin, MD Referring Provider is Marinus Maw, MD  Chief Complaint  Patient presents with  . Congestive Heart Failure    eval for epicardial lead placement    HPI:  The patient is a 59 year old woman with a history of non-ischemic cardiomyopathy with an EF of 30-35% by echo in 10/2013 with restrictive diastolic dysfunction and chronic atrial fibrillation who underwent insertion of a BiV pacer via the left subclavian vein on 06/21/2012 followed by AV ablation the following day. She had an LV lead placed via the coronary sinus with some difficulty due to the anatomy and there was a very low threshold for diaphragmatic pacing so the lead was turned off. She has been followed by Dr. Ladona Ridgel and he feels that it is time to place an LV epicardial lead for BiV pacing.  Past Medical History  Diagnosis Date  . COPD (chronic obstructive pulmonary disease)   . Anxiety and depression   . Atrial fibrillation     Echocardiogram in 2004-borderline LV function; no valvular abnormalities; 06/2012: AV nodal ablation + biventricular pacemaker  . Anemia     2011: Hemoglobin of 11.4; hematocrit of 33.5; normal MCV  . Cardiac arrest 06/2012    16 day hospitalization; caused by hyperkalemia; multiple complications including CHF; permanent atrial fibrillation requiring AV nodal ablation + pacing  . Thyroid disease   . Nonischemic cardiomyopathy     Echo 06/2012: EF 30-35%, moderate to severe MR; cardiac cath 06/2012:30-40% D1 and D2, estimated EF-35%, MR-not graded; right bundle branch block    Past Surgical History  Procedure Laterality Date  . Breast biopsy      left, [facility]  . Cervical conization w/bx  08/11/2011    Procedure: CONIZATION CERVIX WITH BIOPSY;  Surgeon: Lazaro Arms, MD;  Location: [facility] ORS;  Service: Gynecology;  Laterality: N/A;  Laser Conization of Cervix  . Breast excisional biopsy      Left x2  . Tee without cardioversion   06/19/2012    Procedure: TRANSESOPHAGEAL ECHOCARDIOGRAM (TEE);  Surgeon: Vesta Mixer, MD;  Location: [facility] ENDOSCOPY;  Service: Cardiovascular;  Laterality: N/A;  . Cardioversion  06/19/2012    Procedure: CARDIOVERSION;  Surgeon: Vesta Mixer, MD;  Location: [facility] ENDOSCOPY;  Service: Cardiovascular;;  . Cardioversion  06/21/2012    Procedure: CARDIOVERSION;  Surgeon: Cassell Clement, MD;  Location: [facility] OR;  Service: Cardiovascular;  Laterality: N/A;  . Left heart catheterization with coronary angiogram N/A 06/15/2012    Procedure: LEFT HEART CATHETERIZATION WITH CORONARY ANGIOGRAM;  Surgeon: Herby Abraham, MD;  Location: [facility] CATH LAB;  Service: Cardiovascular;  Laterality: N/A;  . Permanent pacemaker insertion N/A 06/21/2012    Procedure: PERMANENT PACEMAKER INSERTION;  Surgeon: Marinus Maw, MD;  Location: [facility] CATH LAB;  Service: Cardiovascular;  Laterality: N/A;  . Av node ablation N/A 06/21/2012    Procedure: AV NODE ABLATION;  Surgeon: Marinus Maw, MD;  Location: [facility] CATH LAB;  Service: Cardiovascular;  Laterality: N/A;  . Av node ablation N/A 06/22/2012    Procedure: AV NODE ABLATION;  Surgeon: Duke Salvia, MD;  Location: [facility] CATH LAB;  Service: Cardiovascular;  Laterality: N/A;    Family History  Problem Relation Age of Onset  . Anesthesia problems Neg Hx   . Hypotension Neg Hx   . Malignant hyperthermia Neg Hx   . Pseudochol deficiency Neg Hx   . Cancer Father     carcinoma of the  lung  . Cancer Sister     carcinoma of the lung  . Colon cancer Neg Hx   . Cervical cancer Mother   . Irritable bowel syndrome      mother, sister  . Celiac disease Neg Hx   . Inflammatory bowel disease Neg Hx     Social History History  Substance Use Topics  . Smoking status: Never Smoker   . Smokeless tobacco: Never Used  . Alcohol Use: No    Current Outpatient Prescriptions  Medication Sig Dispense Refill  . calcium carbonate (TUMS - DOSED IN MG ELEMENTAL CALCIUM) 500 MG  chewable tablet Chew 2 tablets by mouth 3 (three) times daily.    . carvedilol (COREG) 25 MG tablet Take 1 tablet (25 mg total) by mouth 2 (two) times daily. 60 tablet 6  . furosemide (LASIX) 40 MG tablet Take 20 mg by mouth daily.    Marland Kitchen lisinopril (PRINIVIL,ZESTRIL) 10 MG tablet Take 1 tablet (10 mg total) by mouth daily. 30 tablet 6  . pantoprazole (PROTONIX) 40 MG tablet Take 40 mg by mouth daily.    . rivaroxaban (XARELTO) 20 MG TABS tablet Take 1 tablet (20 mg total) by mouth daily with supper. 30 tablet 0  . sertraline (ZOLOFT) 50 MG tablet Take 50 mg by mouth daily.    Marland Kitchen SPIRIVA HANDIHALER 18 MCG inhalation capsule Place 18 mcg into inhaler and inhale daily.     Marland Kitchen zolpidem (AMBIEN) 5 MG tablet Take 5 mg by mouth at bedtime as needed. For sleep     No current facility-administered medications for this visit.    No Known Allergies  Review of Systems  Constitutional: Positive for fatigue. Negative for activity change, appetite change and unexpected weight change.  HENT: Positive for hearing loss.   Eyes: Positive for visual disturbance.  Respiratory: Positive for shortness of breath and wheezing.   Cardiovascular: Negative for chest pain, palpitations and leg swelling.  Gastrointestinal: Positive for diarrhea and constipation.  Endocrine: Negative.   Genitourinary: Negative.   Musculoskeletal: Negative.   Skin: Negative.   Allergic/Immunologic: Negative.   Neurological: Negative.   Hematological: Bruises/bleeds easily.  Psychiatric/Behavioral: Positive for dysphoric mood. The patient is nervous/anxious.     BP 93/57 mmHg  Pulse 84  Resp 20  Ht  (1.626 m)  Wt 140 lb (63.504 kg)  BMI 24.02 kg/m2  SpO2 98% Physical Exam  Constitutional: She is oriented to person, place, and time. She appears well-developed and well-nourished. No distress.  HENT:  Head: Normocephalic and atraumatic.  Mouth/Throat: Oropharynx is clear and moist.  Eyes: EOM are normal. Pupils are equal,  round, and reactive to light.  Neck: Normal range of motion. Neck supple. No thyromegaly present.  Cardiovascular: Normal rate, regular rhythm, normal heart sounds and intact distal pulses.   No murmur heard. Pulmonary/Chest: Effort normal and breath sounds normal. No respiratory distress.  Pacer left subclavicular region  Abdominal: Soft. Bowel sounds are normal. She exhibits no distension and no mass. There is no tenderness.  Musculoskeletal: Normal range of motion. She exhibits no edema.  Lymphadenopathy:    She has no cervical adenopathy.  Neurological: She is alert and oriented to person, place, and time. She has normal strength. No cranial nerve deficit or sensory deficit.  Skin: Skin is warm and dry.  Psychiatric: She has a normal mood and affect.     Diagnostic Tests:  None  Impression:  She has non-ischemic cardiomyopathy with chronic atrial fibrillation and RV pacing  s/p AV node ablation. She has NYHA class II symptoms of exertional shortness of breath and fatigue with normal daily activity such as walking on level ground. I agree with the need by biventricular pacing in this patient. I discussed the surgical procedure of LV epicardial pacing lead placement via a left thoracotomy and revision of her pacer to a BiV system. I discussed the alternative of no surgery, benefits and risks including but not limited to bleeding, infection, injury to the heart, lead malfunction, and the possibility that it may not improve her symptomatically and may not improve her LVEF. She understands and would like to proceed but wants to wait until May when she anticipates that her Medicare approval will be complete.   Plan:  She will call me to schedule LV epicardial pacing lead placement by left thoracotomy with revision of her pacemaker system.

## 2014-10-25 ENCOUNTER — Ambulatory Visit (INDEPENDENT_AMBULATORY_CARE_PROVIDER_SITE_OTHER): Payer: Self-pay | Admitting: *Deleted

## 2014-10-25 DIAGNOSIS — I4891 Unspecified atrial fibrillation: Secondary | ICD-10-CM

## 2014-10-25 DIAGNOSIS — I5022 Chronic systolic (congestive) heart failure: Secondary | ICD-10-CM

## 2014-10-25 DIAGNOSIS — I428 Other cardiomyopathies: Secondary | ICD-10-CM

## 2014-10-25 DIAGNOSIS — I429 Cardiomyopathy, unspecified: Secondary | ICD-10-CM

## 2014-10-25 LAB — MDC_IDC_ENUM_SESS_TYPE_INCLINIC
Battery Remaining Longevity: 53 mo
Battery Voltage: 3 V
Brady Statistic AP VP Percent: 0 %
Brady Statistic AP VS Percent: 0 %
Brady Statistic AS VP Percent: 93.95 %
Brady Statistic AS VS Percent: 6.05 %
Brady Statistic RA Percent Paced: 0 %
Brady Statistic RV Percent Paced: 93.95 %
Date Time Interrogation Session: 20160325133038
Lead Channel Impedance Value: 361 Ohm
Lead Channel Impedance Value: 475 Ohm
Lead Channel Impedance Value: 494 Ohm
Lead Channel Impedance Value: 513 Ohm
Lead Channel Impedance Value: 589 Ohm
Lead Channel Impedance Value: 665 Ohm
Lead Channel Impedance Value: 703 Ohm
Lead Channel Impedance Value: 741 Ohm
Lead Channel Impedance Value: 855 Ohm
Lead Channel Pacing Threshold Amplitude: 0.5 V
Lead Channel Pacing Threshold Pulse Width: 0.4 ms
Lead Channel Sensing Intrinsic Amplitude: 0.375 mV
Lead Channel Sensing Intrinsic Amplitude: 0.375 mV
Lead Channel Sensing Intrinsic Amplitude: 4.375 mV
Lead Channel Sensing Intrinsic Amplitude: 7 mV
Lead Channel Setting Pacing Amplitude: 2 V
Lead Channel Setting Pacing Pulse Width: 0.4 ms
Lead Channel Setting Sensing Sensitivity: 2.8 mV
Zone Setting Detection Interval: 350 ms
Zone Setting Detection Interval: 400 ms

## 2014-10-25 NOTE — Progress Notes (Signed)
PPM check in office with ICM.

## 2014-11-14 ENCOUNTER — Encounter: Payer: Self-pay | Admitting: Internal Medicine

## 2014-11-22 ENCOUNTER — Ambulatory Visit: Payer: Self-pay | Admitting: Cardiology

## 2014-12-02 DIAGNOSIS — H524 Presbyopia: Secondary | ICD-10-CM | POA: Diagnosis not present

## 2014-12-02 DIAGNOSIS — H43393 Other vitreous opacities, bilateral: Secondary | ICD-10-CM | POA: Diagnosis not present

## 2014-12-03 ENCOUNTER — Ambulatory Visit: Payer: Self-pay | Admitting: Cardiology

## 2014-12-26 ENCOUNTER — Ambulatory Visit (INDEPENDENT_AMBULATORY_CARE_PROVIDER_SITE_OTHER): Payer: Medicare Other | Admitting: Cardiology

## 2014-12-26 ENCOUNTER — Encounter: Payer: Self-pay | Admitting: Cardiology

## 2014-12-26 VITALS — BP 122/65 | HR 91 | Ht 64.0 in | Wt 145.4 lb

## 2014-12-26 DIAGNOSIS — I4891 Unspecified atrial fibrillation: Secondary | ICD-10-CM

## 2014-12-26 DIAGNOSIS — I340 Nonrheumatic mitral (valve) insufficiency: Secondary | ICD-10-CM

## 2014-12-26 DIAGNOSIS — Z950 Presence of cardiac pacemaker: Secondary | ICD-10-CM

## 2014-12-26 DIAGNOSIS — I5022 Chronic systolic (congestive) heart failure: Secondary | ICD-10-CM | POA: Diagnosis not present

## 2014-12-26 DIAGNOSIS — J449 Chronic obstructive pulmonary disease, unspecified: Secondary | ICD-10-CM

## 2014-12-26 DIAGNOSIS — I428 Other cardiomyopathies: Secondary | ICD-10-CM

## 2014-12-26 DIAGNOSIS — I429 Cardiomyopathy, unspecified: Secondary | ICD-10-CM

## 2014-12-26 MED ORDER — ALBUTEROL SULFATE HFA 108 (90 BASE) MCG/ACT IN AERS: 2.0000 | INHALATION_SPRAY | Freq: Four times a day (QID) | RESPIRATORY_TRACT | Status: DC | PRN

## 2014-12-26 NOTE — Patient Instructions (Signed)
Your physician recommends that you schedule a follow-up appointment in: 2 MONTHS WITH DR. BRANCH  Your physician has recommended you make the following change in your medication:   ALBUTEROL INHALER 1-2 PUFFS AS NEEDED EVERY 6 HOURS  CONTINUE ALL OTHER MEDICATIONS AS DIRECTED  Your physician recommends that you return for lab work BMP/MAG  Thank you for choosing [facility]!!

## 2014-12-26 NOTE — Progress Notes (Signed)
Clinical Summary [name] is a 59 y.o.female seen today for follow up of the following medical problems.   1. NICM  - echo 10/2013 LVEF 30-35%, restrictive diastolic dysfunction - cath Jan 2011 showed no obstructive CAD  - Weight stable 145 lbs at home. SOB at times she relates to her COPD, worst with warm weather. No orthopnea - limiting sodium intake. Compliant with meds   2. Chronic afib  - prior AV nodal ablation with permanent pacemaker  - biventricular pacer, however LV lead off due to diaphgramatic stimulation per notes   - no palpitations, no lightheadedness or dizziness. No bleeding troubles on rivaroxaban   3. Moderate MR - stable from last echo, no significant symptoms  4. Pacemaker - she has a Medtronic BiV pacemaker followed by Dr Ladona Ridgel, LV lead turned off due to prior diaphragmatic stimulation. - seen by Dr Laneta Simmers for evaluation for epicardial lead, plans for placement now that her medicare is approved.  - pacemaker check 11/2014 with normal function  5. COPD - management per Dr Juanetta Gosling.  Past Medical History  Diagnosis Date  . COPD (chronic obstructive pulmonary disease)   . Anxiety and depression   . Atrial fibrillation     Echocardiogram in 2004-borderline LV function; no valvular abnormalities; 06/2012: AV nodal ablation + biventricular pacemaker  . Anemia     2011: Hemoglobin of 11.4; hematocrit of 33.5; normal MCV  . Cardiac arrest 06/2012    16 day hospitalization; caused by hyperkalemia; multiple complications including CHF; permanent atrial fibrillation requiring AV nodal ablation + pacing  . Thyroid disease   . Nonischemic cardiomyopathy     Echo 06/2012: EF 30-35%, moderate to severe MR; cardiac cath 06/2012:30-40% D1 and D2, estimated EF-35%, MR-not graded; right bundle Channing Savich block     No Known Allergies   Current Outpatient Prescriptions  Medication Sig Dispense Refill  . ALPRAZolam (XANAX) 0.25 MG tablet Take 0.25 mg by mouth  2 (two) times daily as needed for anxiety.    . calcium carbonate (TUMS - DOSED IN MG ELEMENTAL CALCIUM) 500 MG chewable tablet Chew 2 tablets by mouth 3 (three) times daily.    . carvedilol (COREG) 25 MG tablet Take 1 tablet (25 mg total) by mouth 2 (two) times daily. 60 tablet 6  . furosemide (LASIX) 40 MG tablet Take 20 mg by mouth daily.    Marland Kitchen lisinopril (PRINIVIL,ZESTRIL) 10 MG tablet Take 1 tablet (10 mg total) by mouth daily. 30 tablet 6  . pantoprazole (PROTONIX) 40 MG tablet Take 40 mg by mouth daily.    . rivaroxaban (XARELTO) 20 MG TABS tablet Take 1 tablet (20 mg total) by mouth daily with supper. 30 tablet 0  . sertraline (ZOLOFT) 50 MG tablet Take 50 mg by mouth daily.    Marland Kitchen SPIRIVA HANDIHALER 18 MCG inhalation capsule Place 18 mcg into inhaler and inhale daily.     Marland Kitchen zolpidem (AMBIEN) 5 MG tablet Take 5 mg by mouth at bedtime as needed. For sleep     No current facility-administered medications for this visit.     Past Surgical History  Procedure Laterality Date  . Breast biopsy      left, [facility]  . Cervical conization w/bx  08/11/2011    Procedure: CONIZATION CERVIX WITH BIOPSY;  Surgeon: Lazaro Arms, MD;  Location: [facility];  Service: Gynecology;  Laterality: N/A;  Laser Conization of Cervix  . Breast excisional biopsy      Left x2  .  Tee without cardioversion  06/19/2012    Procedure: TRANSESOPHAGEAL ECHOCARDIOGRAM (TEE);  Surgeon: Vesta Mixer, MD;  Location: [facility] ENDOSCOPY;  Service: Cardiovascular;  Laterality: N/A;  . Cardioversion  06/19/2012    Procedure: CARDIOVERSION;  Surgeon: Vesta Mixer, MD;  Location: [facility] ENDOSCOPY;  Service: Cardiovascular;;  . Cardioversion  06/21/2012    Procedure: CARDIOVERSION;  Surgeon: Cassell Clement, MD;  Location: [facility] OR;  Service: Cardiovascular;  Laterality: N/A;  . Left heart catheterization with coronary angiogram N/A 06/15/2012    Procedure: LEFT HEART CATHETERIZATION WITH CORONARY ANGIOGRAM;  Surgeon: Herby Abraham, MD;   Location: [facility] CATH LAB;  Service: Cardiovascular;  Laterality: N/A;  . Permanent pacemaker insertion N/A 06/21/2012    Procedure: PERMANENT PACEMAKER INSERTION;  Surgeon: Marinus Maw, MD;  Location: [facility] CATH LAB;  Service: Cardiovascular;  Laterality: N/A;  . Av node ablation N/A 06/21/2012    Procedure: AV NODE ABLATION;  Surgeon: Marinus Maw, MD;  Location: [facility] CATH LAB;  Service: Cardiovascular;  Laterality: N/A;  . Av node ablation N/A 06/22/2012    Procedure: AV NODE ABLATION;  Surgeon: Duke Salvia, MD;  Location: [facility] CATH LAB;  Service: Cardiovascular;  Laterality: N/A;     No Known Allergies    Family History  Problem Relation Age of Onset  . Anesthesia problems Neg Hx   . Hypotension Neg Hx   . Malignant hyperthermia Neg Hx   . Pseudochol deficiency Neg Hx   . Cancer Father     carcinoma of the lung  . Cancer Sister     carcinoma of the lung  . Colon cancer Neg Hx   . Cervical cancer Mother   . Irritable bowel syndrome      mother, sister  . Celiac disease Neg Hx   . Inflammatory bowel disease Neg Hx      Social History Ms. Nickle reports that she has never smoked. She has never used smokeless tobacco. Ms. Gitto reports that she does not drink alcohol.   Review of Systems CONSTITUTIONAL: No weight loss, fever, chills, weakness or fatigue.  HEENT: Eyes: No visual loss, blurred vision, double vision or yellow sclerae.No hearing loss, sneezing, congestion, runny nose or sore throat.  SKIN: No rash or itching.  CARDIOVASCULAR: per HPI RESPIRATORY: opccasional SOB.  GASTROINTESTINAL: No anorexia, nausea, vomiting or diarrhea. No abdominal pain or blood.  GENITOURINARY: No burning on urination, no polyuria NEUROLOGICAL: No headache, dizziness, syncope, paralysis, ataxia, numbness or tingling in the extremities. No change in bowel or bladder control.  MUSCULOSKELETAL: No muscle, back pain, joint pain or stiffness.  LYMPHATICS: No enlarged nodes. No history of  splenectomy.  PSYCHIATRIC: No history of depression or anxiety.  ENDOCRINOLOGIC: No reports of sweating, cold or heat intolerance. No polyuria or polydipsia.  Marland Kitchen   Physical Examination Filed Vitals:   12/26/14 1439  BP: 122/65  Pulse: 91   Filed Vitals:   12/26/14 1439  Height:  (1.626 m)  Weight: 145 lb 6.4 oz (65.953 kg)    Gen: resting comfortably, no acute distress HEENT: no scleral icterus, pupils equal round and reactive, no palptable cervical adenopathy,  CV: RRR, no m/r/g, no JVD Resp: Clear to auscultation bilaterally GI: abdomen is soft, non-tender, non-distended, normal bowel sounds, no hepatosplenomegaly MSK: extremities are warm, no edema.  Skin: warm, no rash Neuro:  no focal deficits Psych: appropriate affect   Diagnostic Studies  06/2012 TEE: mod to severe MR, milld to mod TR   06/12/12 Cath  Hemodynamics:  AO 118/68 (83)  LV 119/8  Cannot calculate gradient due to rapid atrial fib  Coronary angiography:  Coronary dominance: right  Left mainstem: Short without significant disease  Left anterior descending (LAD): There is calcification proximally. There are two major diagonal branches. Between the first and second diagonal branches are approximately 30-40% plaquing. The distal LAD has mild irregularity. Both diagonals have minimal irregularity.  Left circumflex (LCx): Provides one large bifurcation marginal Nikolette Reindl and a small AV circumflex. There is minor distal irregularity. The AV circ is small  Right coronary artery (RCA): Provides a large PDA and a large and smaller PLA branches. No significant focal obstruction.  Left ventriculography: Due to ectopy, EF cannot be calculated. With AF, difficult to be sure of function but appears 35%. There does appear to be some MR.  Final Conclusions:  1. Atrial fib with rapid ventricular response.  2. Mild calcification of proximal LAD without critical obstruction. No high grade coronary artery  disease.  09/2012 Echo: LVEF 25%, severely dilated LV, mild LVH, mild AI, mild MR   09/26/13 Clinic EKG  Afib, V-paced  11/08/13 Echo Study Conclusions  - Left ventricle: The cavity size was moderately dilated. Wall thickness was increased in a pattern of mild LVH. Systolic function was moderately to severely reduced. The estimated ejection fraction was in the range of 30% to 35%. Diffuse hypokinesis. There is severe hypokinesis of the anteroseptal myocardium. Doppler parameters are consistent with restrictive physiology, indicative of decreased left ventricular diastolic compliance and/or increased left atrial pressure. - Ventricular septum: Septal motion showed abnormal function and dyssynergy. - Aortic valve: Trileaflet; mildly thickened leaflets. Mild to moderate regurgitation. - Mitral valve: Mildly thickened leaflets . Moderate regurgitation, appears to be made up of two jets. Suboptimal PISA. - Left atrium: The atrium was severely dilated. - Right ventricle: The cavity size was mildly dilated. Pacer wire or catheter noted in right ventricle. - Right atrium: The atrium was moderately to severely dilated. - Tricuspid valve: Mild-moderate regurgitation. - Pulmonic valve: Mild regurgitation. - Pulmonary arteries: Systolic pressure was moderately increased. - Pericardium, extracardiac: There was no pericardial effusion. Impressions:  - Mild LVH with moderate chamber dilatation and LVEF 30-35% with diffuse hypokinesis, most prominent anteroseptal wall in setting of septal dyssynergy and pacing. Restrictive diastolic filling pattern with increased filling pressures. Severe left atrial and moderate to severe right atrial enlargement. MIldly thickened mitral valve with overall moderate mitral regurgitation made up of two jets (PISA suboptimal). Mild to moderate aortic regurgitation. Device wire in right heart. MIld to moderate tricuspid regurgitation with PASP 51  mmHg.    Assessment and Plan   1. NICM/Chronic systolic HF - LVEF 30-35% by echo 10/2013, NYHA 2. She is euvolemic today in clinic  - continue current meds   2. Afib  - hx of av nodal ablation with pacemaker  - denies any symptoms, pacer with normal function at last check  - continue xarelto for stroke prophylaxis.    3. Mitral regurgitation  - moderate to severe by TEE 06/2012, follow up TTE's have been stable mild to moderate. No current symptoms, likely functional MR related to her LV systolic dysfunction.  - continue to follow clinically.    4. Pacemaker - awaiting epicardial lead placement by Dr Laneta Simmers. She is to call him soon once she arranges scheduling with her family.    5. COPD - followed by Dr Juanetta Gosling. Notes some recent seasonal worsening of symptoms. Will give albuterol prn rescue inhaler.  F/u 2 months. Check BMET and Mg  [name], M.D.

## 2014-12-27 ENCOUNTER — Telehealth: Payer: Self-pay | Admitting: Cardiology

## 2014-12-27 DIAGNOSIS — Z950 Presence of cardiac pacemaker: Secondary | ICD-10-CM

## 2014-12-27 NOTE — Telephone Encounter (Signed)
Mrs. Antkowiak is ready to set up appointment with Dr. Laneta Simmers

## 2014-12-31 ENCOUNTER — Other Ambulatory Visit: Payer: Self-pay | Admitting: Cardiology

## 2014-12-31 DIAGNOSIS — I5022 Chronic systolic (congestive) heart failure: Secondary | ICD-10-CM | POA: Diagnosis not present

## 2014-12-31 LAB — BASIC METABOLIC PANEL
BUN: 19 mg/dL (ref 6–23)
CO2: 25 mEq/L (ref 19–32)
Calcium: 8.9 mg/dL (ref 8.4–10.5)
Chloride: 101 mEq/L (ref 96–112)
Creat: 0.95 mg/dL (ref 0.50–1.10)
Glucose, Bld: 107 mg/dL — ABNORMAL HIGH (ref 70–99)
POTASSIUM: 4.2 meq/L (ref 3.5–5.3)
Sodium: 137 mEq/L (ref 135–145)

## 2014-12-31 LAB — MAGNESIUM: Magnesium: 1.3 mg/dL — ABNORMAL LOW (ref 1.5–2.5)

## 2015-01-01 NOTE — Telephone Encounter (Signed)
Referral to Dr. Laneta Simmers placed per schedulers and LOV

## 2015-01-02 ENCOUNTER — Telehealth: Payer: Self-pay | Admitting: *Deleted

## 2015-01-02 MED ORDER — MAGNESIUM OXIDE 400 MG PO TABS: ORAL_TABLET | ORAL | Status: DC

## 2015-01-02 NOTE — Telephone Encounter (Signed)
Pt voiced understanding of mag instructions. Medication sent to pharmacy. Routed results to pcp

## 2015-01-02 NOTE — Telephone Encounter (Signed)
-----   Message from [name], MD sent at 01/01/2015  2:45 PM EDT ----- Labs look good other than her magnesium is low, likely due to her lasix. Please have her take magnesium oxide 400mg  bid for 4 days and then 400mg  daily after that   [name] Ferry MD

## 2015-01-08 DIAGNOSIS — F419 Anxiety disorder, unspecified: Secondary | ICD-10-CM | POA: Diagnosis not present

## 2015-01-08 DIAGNOSIS — I482 Chronic atrial fibrillation: Secondary | ICD-10-CM | POA: Diagnosis not present

## 2015-01-08 DIAGNOSIS — J441 Chronic obstructive pulmonary disease with (acute) exacerbation: Secondary | ICD-10-CM | POA: Diagnosis not present

## 2015-01-08 DIAGNOSIS — E118 Type 2 diabetes mellitus with unspecified complications: Secondary | ICD-10-CM | POA: Diagnosis not present

## 2015-01-09 ENCOUNTER — Other Ambulatory Visit: Payer: Self-pay | Admitting: Internal Medicine

## 2015-01-09 DIAGNOSIS — I4891 Unspecified atrial fibrillation: Secondary | ICD-10-CM

## 2015-01-09 DIAGNOSIS — I5022 Chronic systolic (congestive) heart failure: Secondary | ICD-10-CM

## 2015-01-09 MED ORDER — CARVEDILOL 25 MG PO TABS: 25.0000 mg | ORAL_TABLET | Freq: Two times a day (BID) | ORAL | Status: DC

## 2015-01-09 NOTE — Telephone Encounter (Signed)
Needs refill on Carvedilol sent to [facility] / [facility]

## 2015-01-09 NOTE — Telephone Encounter (Signed)
Refill complete 

## 2015-01-10 ENCOUNTER — Encounter: Payer: Self-pay | Admitting: Surgery

## 2015-01-10 ENCOUNTER — Ambulatory Visit (INDEPENDENT_AMBULATORY_CARE_PROVIDER_SITE_OTHER): Payer: Medicare Other | Admitting: Surgery

## 2015-01-10 VITALS — BP 170/86 | HR 80 | Resp 20 | Ht 64.0 in | Wt 145.0 lb

## 2015-01-10 DIAGNOSIS — I5030 Unspecified diastolic (congestive) heart failure: Secondary | ICD-10-CM | POA: Diagnosis not present

## 2015-01-14 ENCOUNTER — Other Ambulatory Visit: Payer: Self-pay | Admitting: *Deleted

## 2015-01-14 ENCOUNTER — Encounter: Payer: Self-pay | Admitting: Surgery

## 2015-01-14 DIAGNOSIS — I509 Heart failure, unspecified: Secondary | ICD-10-CM

## 2015-01-14 NOTE — Progress Notes (Signed)
HPI:  The patient is a 59 year old woman with a history of non-ischemic cardiomyopathy with an EF of 30-35% by echo in 10/2013 with restrictive diastolic dysfunction and chronic atrial fibrillation who underwent insertion of a BiV pacer via the left subclavian vein on 06/21/2012 followed by AV ablation the following day. She had an LV lead placed via the coronary sinus with some difficulty due to the anatomy and there was a very low threshold for diaphragmatic pacing so the lead was turned off. She has been followed by Dr. Ladona Ridgel and he feels that it is time to place an LV epicardial lead for BiV pacing. I saw her initially on 10/03/2014 and agreed with proceeding with insertion of an LV epicardial pacing lead through a left thoracotomy for biventricular pacing. She wanted to wait until she got her Medicare approval which she now has. Her symptoms have been stable with exertional fatigue and shortness of breath. She has been NYHA class II.   Current Outpatient Prescriptions  Medication Sig Dispense Refill  . albuterol (PROVENTIL HFA;VENTOLIN HFA) 108 (90 BASE) MCG/ACT inhaler Inhale 2 puffs into the lungs every 6 (six) hours as needed for wheezing or shortness of breath. 1 Inhaler 2  . ALPRAZolam (XANAX) 0.25 MG tablet Take 0.25 mg by mouth 2 (two) times daily as needed for anxiety.    . calcium carbonate (TUMS - DOSED IN MG ELEMENTAL CALCIUM) 500 MG chewable tablet Chew 2 tablets by mouth 3 (three) times daily.    . carvedilol (COREG) 25 MG tablet Take 1 tablet (25 mg total) by mouth 2 (two) times daily. 60 tablet 6  . cephALEXin (KEFLEX) 500 MG capsule     . furosemide (LASIX) 40 MG tablet Take 20 mg by mouth daily.    Marland Kitchen lisinopril (PRINIVIL,ZESTRIL) 10 MG tablet Take 1 tablet (10 mg total) by mouth daily. 30 tablet 6  . magnesium oxide (MAG-OX) 400 MG tablet TAKE 2 TIMES DAILY UNTIL 01/06/15 THEN TAKE 1 TAB DAILY 38 tablet 3  . methylPREDNISolone (MEDROL DOSEPAK) 4 MG TBPK tablet     .  pantoprazole (PROTONIX) 40 MG tablet Take 40 mg by mouth daily.    . rivaroxaban (XARELTO) 20 MG TABS tablet Take 1 tablet (20 mg total) by mouth daily with supper. 30 tablet 0  . sertraline (ZOLOFT) 50 MG tablet Take 50 mg by mouth daily.    Marland Kitchen SPIRIVA HANDIHALER 18 MCG inhalation capsule Place 18 mcg into inhaler and inhale daily.     Marland Kitchen zolpidem (AMBIEN) 5 MG tablet Take 5 mg by mouth at bedtime as needed. For sleep     No current facility-administered medications for this visit.     Physical Exam: BP 170/86 mmHg  Pulse 80  Resp 20  Ht 5\' 4"  (1.626 m)  Wt 145 lb (65.772 kg)  BMI 24.88 kg/m2  SpO2 97% She looks well Lungs are clear Cardiovascular: Normal rate, regular rhythm, normal heart sounds and intact distal pulses.  No murmur heard. There is no peripheral edema  Diagnostic Tests: none  Impression:  She has non-ischemic cardiomyopathy with chronic atrial fibrillation and RV pacing s/p AV node ablation. She has NYHA class II symptoms of exertional shortness of breath and fatigue with normal daily activity such as walking on level ground. I agree with the need by biventricular pacing in this patient. I discussed the surgical procedure of LV epicardial pacing lead placement via a left thoracotomy and revision of her pacer to a  BiV system. I discussed the alternative of no surgery, benefits and risks including but not limited to bleeding, infection, injury to the heart, lead malfunction, and the possibility that it may not improve her symptomatically and may not improve her LVEF. She understands and would like to proceed.  Plan:  She wants to wait until July to have surgery and will call to schedule.   Alleen Borne, MD [facility] [phone]

## 2015-02-10 ENCOUNTER — Other Ambulatory Visit: Payer: Self-pay | Admitting: Cardiology

## 2015-02-10 MED ORDER — RIVAROXABAN 20 MG PO TABS: 20.0000 mg | ORAL_TABLET | Freq: Every day | ORAL | Status: DC

## 2015-02-10 NOTE — Telephone Encounter (Signed)
Needs Xlareto refill [facility] in [place]

## 2015-02-19 ENCOUNTER — Other Ambulatory Visit (HOSPITAL_COMMUNITY): Payer: Self-pay

## 2015-02-26 ENCOUNTER — Encounter (HOSPITAL_COMMUNITY)
Admission: RE | Admit: 2015-02-26 | Discharge: 2015-02-26 | Disposition: A | Discharge status: Home / Self Care | Payer: Medicare Other | Source: Ambulatory Visit | Attending: Surgery | Admitting: Surgery

## 2015-02-26 ENCOUNTER — Encounter (HOSPITAL_COMMUNITY): Payer: Self-pay

## 2015-02-26 VITALS — BP 140/64 | HR 80 | Temp 98.1°F | Resp 20 | Ht 64.0 in | Wt 143.6 lb

## 2015-02-26 DIAGNOSIS — I2510 Atherosclerotic heart disease of native coronary artery without angina pectoris: Secondary | ICD-10-CM

## 2015-02-26 DIAGNOSIS — I428 Other cardiomyopathies: Secondary | ICD-10-CM | POA: Diagnosis not present

## 2015-02-26 DIAGNOSIS — R0989 Other specified symptoms and signs involving the circulatory and respiratory systems: Secondary | ICD-10-CM | POA: Diagnosis not present

## 2015-02-26 DIAGNOSIS — M8580 Other specified disorders of bone density and structure, unspecified site: Secondary | ICD-10-CM | POA: Insufficient documentation

## 2015-02-26 DIAGNOSIS — I5022 Chronic systolic (congestive) heart failure: Secondary | ICD-10-CM | POA: Diagnosis not present

## 2015-02-26 DIAGNOSIS — I509 Heart failure, unspecified: Secondary | ICD-10-CM

## 2015-02-26 DIAGNOSIS — E079 Disorder of thyroid, unspecified: Secondary | ICD-10-CM | POA: Insufficient documentation

## 2015-02-26 DIAGNOSIS — E8801 Alpha-1-antitrypsin deficiency: Secondary | ICD-10-CM | POA: Insufficient documentation

## 2015-02-26 DIAGNOSIS — J90 Pleural effusion, not elsewhere classified: Secondary | ICD-10-CM | POA: Diagnosis not present

## 2015-02-26 DIAGNOSIS — Z8674 Personal history of sudden cardiac arrest: Secondary | ICD-10-CM | POA: Diagnosis not present

## 2015-02-26 DIAGNOSIS — I2589 Other forms of chronic ischemic heart disease: Secondary | ICD-10-CM | POA: Diagnosis not present

## 2015-02-26 DIAGNOSIS — K219 Gastro-esophageal reflux disease without esophagitis: Secondary | ICD-10-CM

## 2015-02-26 DIAGNOSIS — D62 Acute posthemorrhagic anemia: Secondary | ICD-10-CM | POA: Diagnosis not present

## 2015-02-26 DIAGNOSIS — Z79899 Other long term (current) drug therapy: Secondary | ICD-10-CM

## 2015-02-26 DIAGNOSIS — Z01812 Encounter for preprocedural laboratory examination: Secondary | ICD-10-CM

## 2015-02-26 DIAGNOSIS — Z01818 Encounter for other preprocedural examination: Secondary | ICD-10-CM | POA: Insufficient documentation

## 2015-02-26 DIAGNOSIS — I429 Cardiomyopathy, unspecified: Secondary | ICD-10-CM | POA: Diagnosis present

## 2015-02-26 DIAGNOSIS — Z950 Presence of cardiac pacemaker: Secondary | ICD-10-CM | POA: Insufficient documentation

## 2015-02-26 DIAGNOSIS — J9811 Atelectasis: Secondary | ICD-10-CM | POA: Diagnosis not present

## 2015-02-26 DIAGNOSIS — I517 Cardiomegaly: Secondary | ICD-10-CM | POA: Insufficient documentation

## 2015-02-26 DIAGNOSIS — J9383 Other pneumothorax: Secondary | ICD-10-CM | POA: Diagnosis not present

## 2015-02-26 DIAGNOSIS — Z0183 Encounter for blood typing: Secondary | ICD-10-CM

## 2015-02-26 DIAGNOSIS — E119 Type 2 diabetes mellitus without complications: Secondary | ICD-10-CM | POA: Diagnosis present

## 2015-02-26 DIAGNOSIS — I10 Essential (primary) hypertension: Secondary | ICD-10-CM | POA: Diagnosis present

## 2015-02-26 DIAGNOSIS — Z9889 Other specified postprocedural states: Secondary | ICD-10-CM | POA: Diagnosis not present

## 2015-02-26 DIAGNOSIS — Z01810 Encounter for preprocedural cardiovascular examination: Secondary | ICD-10-CM | POA: Diagnosis not present

## 2015-02-26 DIAGNOSIS — Z8049 Family history of malignant neoplasm of other genital organs: Secondary | ICD-10-CM | POA: Diagnosis not present

## 2015-02-26 DIAGNOSIS — Z801 Family history of malignant neoplasm of trachea, bronchus and lung: Secondary | ICD-10-CM | POA: Diagnosis not present

## 2015-02-26 DIAGNOSIS — J449 Chronic obstructive pulmonary disease, unspecified: Secondary | ICD-10-CM | POA: Diagnosis not present

## 2015-02-26 DIAGNOSIS — F419 Anxiety disorder, unspecified: Secondary | ICD-10-CM | POA: Diagnosis present

## 2015-02-26 DIAGNOSIS — I482 Chronic atrial fibrillation: Secondary | ICD-10-CM | POA: Diagnosis present

## 2015-02-26 DIAGNOSIS — F329 Major depressive disorder, single episode, unspecified: Secondary | ICD-10-CM | POA: Diagnosis present

## 2015-02-26 DIAGNOSIS — I472 Ventricular tachycardia: Secondary | ICD-10-CM | POA: Diagnosis not present

## 2015-02-26 DIAGNOSIS — R0602 Shortness of breath: Secondary | ICD-10-CM | POA: Diagnosis not present

## 2015-02-26 DIAGNOSIS — J45909 Unspecified asthma, uncomplicated: Secondary | ICD-10-CM | POA: Diagnosis present

## 2015-02-26 DIAGNOSIS — Z452 Encounter for adjustment and management of vascular access device: Secondary | ICD-10-CM | POA: Diagnosis not present

## 2015-02-26 DIAGNOSIS — Z4682 Encounter for fitting and adjustment of non-vascular catheter: Secondary | ICD-10-CM | POA: Diagnosis not present

## 2015-02-26 HISTORY — DX: Heart failure, unspecified: I50.9

## 2015-02-26 HISTORY — DX: Frequency of micturition: R35.0

## 2015-02-26 HISTORY — DX: Presence of cardiac pacemaker: Z95.0

## 2015-02-26 HISTORY — DX: Gastro-esophageal reflux disease without esophagitis: K21.9

## 2015-02-26 HISTORY — DX: Reserved for inherently not codable concepts without codable children: IMO0001

## 2015-02-26 LAB — BLOOD GAS, ARTERIAL
Acid-base deficit: 0.7 mmol/L (ref 0.0–2.0)
BICARBONATE: 22.6 meq/L (ref 20.0–24.0)
DRAWN BY: 421801
FIO2: 0.21
O2 SAT: 95.5 %
PATIENT TEMPERATURE: 98.6
TCO2: 23.6 mmol/L (ref 0–100)
pCO2 arterial: 31.8 mmHg — ABNORMAL LOW (ref 35.0–45.0)
pH, Arterial: 7.466 — ABNORMAL HIGH (ref 7.350–7.450)
pO2, Arterial: 73.7 mmHg — ABNORMAL LOW (ref 80.0–100.0)

## 2015-02-26 LAB — URINALYSIS, ROUTINE W REFLEX MICROSCOPIC
Bilirubin Urine: NEGATIVE
Glucose, UA: NEGATIVE mg/dL
HGB URINE DIPSTICK: NEGATIVE
Ketones, ur: NEGATIVE mg/dL
Leukocytes, UA: NEGATIVE
NITRITE: NEGATIVE
Protein, ur: NEGATIVE mg/dL
Specific Gravity, Urine: 1.016 (ref 1.005–1.030)
Urobilinogen, UA: 0.2 mg/dL (ref 0.0–1.0)
pH: 5.5 (ref 5.0–8.0)

## 2015-02-26 LAB — CBC
HEMATOCRIT: 32.6 % — AB (ref 36.0–46.0)
Hemoglobin: 11.1 g/dL — ABNORMAL LOW (ref 12.0–15.0)
MCH: 31.1 pg (ref 26.0–34.0)
MCHC: 34 g/dL (ref 30.0–36.0)
MCV: 91.3 fL (ref 78.0–100.0)
Platelets: 160 10*3/uL (ref 150–400)
RBC: 3.57 MIL/uL — AB (ref 3.87–5.11)
RDW: 14.6 % (ref 11.5–15.5)
WBC: 5.5 10*3/uL (ref 4.0–10.5)

## 2015-02-26 LAB — APTT: aPTT: 29 seconds (ref 24–37)

## 2015-02-26 LAB — COMPREHENSIVE METABOLIC PANEL
ALT: 17 U/L (ref 14–54)
AST: 26 U/L (ref 15–41)
Albumin: 3.9 g/dL (ref 3.5–5.0)
Alkaline Phosphatase: 82 U/L (ref 38–126)
Anion gap: 8 (ref 5–15)
BUN: 23 mg/dL — ABNORMAL HIGH (ref 6–20)
CHLORIDE: 103 mmol/L (ref 101–111)
CO2: 25 mmol/L (ref 22–32)
Calcium: 9.1 mg/dL (ref 8.9–10.3)
Creatinine, Ser: 0.97 mg/dL (ref 0.44–1.00)
GFR calc Af Amer: >60 mL/min (ref >60)
GFR calc non Af Amer: >60 mL/min (ref >60)
Glucose, Bld: 105 mg/dL — ABNORMAL HIGH (ref 65–99)
Potassium: 3.9 mmol/L (ref 3.5–5.1)
Sodium: 136 mmol/L (ref 135–145)
Total Bilirubin: 0.8 mg/dL (ref 0.3–1.2)
Total Protein: 7 g/dL (ref 6.5–8.1)

## 2015-02-26 LAB — ABO/RH: ABO/RH(D): O POS

## 2015-02-26 LAB — PROTIME-INR
INR: 1.05 (ref 0.00–1.49)
PROTHROMBIN TIME: 13.9 s (ref 11.6–15.2)

## 2015-02-26 LAB — TYPE AND SCREEN
ABO/RH(D): O POS
ANTIBODY SCREEN: NEGATIVE

## 2015-02-26 LAB — SURGICAL PCR SCREEN
MRSA, PCR: NEGATIVE
Staphylococcus aureus: NEGATIVE

## 2015-02-26 MED ORDER — DEXTROSE 5 % IV SOLN: 1.5000 g | INTRAVENOUS | Status: DC

## 2015-02-26 NOTE — Progress Notes (Signed)
PCP- Dr. Juanetta Gosling  Cardiologist - Dr. Danton Clap- April 2015 - Epic Stress Test/Cardiac Cath - denies EKG - 02/26/15 - Epic CXR - 02/26/15  - Epic  Pt. Does have a pacemaker, Medtronic.  Form faxed on 02/26/15 at 1420.    Patient stated she stopped taking Xarelto on Sunday, 02/23/15.

## 2015-02-26 NOTE — Pre-Procedure Instructions (Signed)
    [name] [name]  02/26/2015      [facility] - [place], [place] - [address] [place] [address] Phone: [phone] Fax: [phone]  [facility] - [place], [place] - [address] [name] [address] Phone: [phone] Fax: [phone]    Your procedure is scheduled on Friday, July 29th, 2016 at 7:30 AM.  Report to [facility] Admitting at 5:30 A.M.  Call this number if you have problems the morning of surgery:  [phone]   Remember:  Do not eat food or drink liquids after midnight.   Take these medicines the morning of surgery with A SIP OF WATER: Albuterol Inhaler (please bring with you), Alprazolam (Xanax) if needed, Carvedilol (Coreg), Pantoprazole (Protonix), Sertraline (Zoloft), Spiriva Inhaler (please bring with you).  Stop taking: Xarelto, NSAIDS, Aspirin, Ibuprofen, Naproxen, Aleve, BC's, Goody's, all herbal medications, and all vitamins.    Do not wear jewelry, make-up or nail polish.  Do not wear lotions, powders, or perfumes.  You may NOT wear deodorant.  Do not shave 48 hours prior to surgery.    Do not bring valuables to the hospital.  [facility] is not responsible for any belongings or valuables.  Contacts, dentures or bridgework may not be worn into surgery.  Leave your suitcase in the car.  After surgery it may be brought to your room.  For patients admitted to the hospital, discharge time will be determined by your treatment team.  Patients discharged the day of surgery will not be allowed to drive home.   Special instructions:  See attached.   Please read over the following fact sheets that you were given. Pain Booklet, Coughing and Deep Breathing, Blood Transfusion Information, MRSA Information and Surgical Site Infection Prevention

## 2015-02-27 ENCOUNTER — Encounter (HOSPITAL_COMMUNITY): Payer: Self-pay

## 2015-02-27 NOTE — Progress Notes (Signed)
Anesthesia Chart Review: Patient is a 59 year old female scheduled for left thoracotomy for insertion of LV epicardial pacing leads on 02/28/15 by Dr. Laneta Simmers.  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  PCP is Dr. Juanetta Gosling. Cardiologist is Dr. Wyline Mood. EP cardiologist is Dr. Ladona Ridgel.   Meds include albuterol, Xanax, Coreg, Lasix, lisinopril, Mag-ox, Protonix, Xarelto, Zoloft, Spiriva, Ambien. She held Xarelto starting 02/23/15.   11/08/13 Echo: Mild LVH with moderate chamber dilatation and LVEF 30-35% with diffuse hypokinesis, most prominent anteroseptal wall in setting of septal dyssynergy and pacing. Restrictivediastolic filling pattern with increased fillingpressures. Severe left atrial and moderate to severe rightatrial enlargement. MIldly thickened mitral valve withoverall moderate mitral regurgitation made up of two jets(PISA suboptimal). Mild to moderate aortic regurgitation.Device wire in right heart. MIld to moderate tricuspidregurgitation with PASP 51 mmHg.  08/16/11 Cardiac cath:  Hemodynamics: AO 118/68 (83) LV 119/8 Cannot calculate gradient due to rapid atrial fib  Coronary angiography: Coronary dominance: right -Left mainstem: Short without significant disease -Left anterior descending (LAD): There is calcification proximally. There are two major diagonal branches. Between the first and second diagonal branches are approximately 30-40% plaquing. The distal LAD has mild irregularity. Both diagonals have minimal irregularity. -Left circumflex (LCx): Provides one large bifurcation marginal branch and a  small AV circumflex. There is minor distal irregularity. The AV circ is small -Right coronary artery (RCA): Provides a large PDA and a large and smaller PLA branches. No significant focal obstruction.  Left ventriculography: Due to ectopy, EF cannot be calculated. With AF, difficult to be sure of function but appears 35%. There does appear to be some MR.  Final Conclusions:  1. Atrial fib with rapid ventricular response.  2. Mild calcification of proximal LAD without critical obstruction. No high grade coronary artery disease.   She is for an EKG on arrival.   02/26/15 CXR: IMPRESSION: Stable moderate cardiomegaly with permanent pacemaker.  Preoperative labs noted.   If no acute changes then I anticipate that she can proceed as planned. Nursing staff to notify Med[employer] rep.   Velna Ochs [facility] Short Stay Center/Anesthesiology Phone [phone] 02/27/2015 10:09 AM

## 2015-02-28 ENCOUNTER — Encounter (HOSPITAL_COMMUNITY): Payer: Self-pay | Admitting: *Deleted

## 2015-02-28 ENCOUNTER — Inpatient Hospital Stay (HOSPITAL_COMMUNITY)
Admission: RE | Admit: 2015-02-28 | Discharge: 2015-03-05 | DRG: 292 | Disposition: A | Discharge status: Home / Self Care | Payer: Medicare Other | Source: Ambulatory Visit | Attending: Surgery | Admitting: Surgery

## 2015-02-28 ENCOUNTER — Inpatient Hospital Stay (HOSPITAL_COMMUNITY): Payer: Medicare Other | Admitting: Anesthesiology

## 2015-02-28 ENCOUNTER — Encounter (HOSPITAL_COMMUNITY)
Admission: RE | Disposition: A | Discharge status: Home / Self Care | Payer: Self-pay | Source: Ambulatory Visit | Attending: Surgery

## 2015-02-28 ENCOUNTER — Ambulatory Visit: Payer: Medicare Other | Admitting: Cardiology

## 2015-02-28 ENCOUNTER — Inpatient Hospital Stay (HOSPITAL_COMMUNITY): Payer: Medicare Other | Admitting: Vascular Surgery

## 2015-02-28 ENCOUNTER — Inpatient Hospital Stay (HOSPITAL_COMMUNITY): Payer: Medicare Other

## 2015-02-28 DIAGNOSIS — Z8674 Personal history of sudden cardiac arrest: Secondary | ICD-10-CM

## 2015-02-28 DIAGNOSIS — Z4682 Encounter for fitting and adjustment of non-vascular catheter: Secondary | ICD-10-CM | POA: Diagnosis not present

## 2015-02-28 DIAGNOSIS — I10 Essential (primary) hypertension: Secondary | ICD-10-CM | POA: Diagnosis present

## 2015-02-28 DIAGNOSIS — R0989 Other specified symptoms and signs involving the circulatory and respiratory systems: Secondary | ICD-10-CM | POA: Diagnosis not present

## 2015-02-28 DIAGNOSIS — I5022 Chronic systolic (congestive) heart failure: Principal | ICD-10-CM

## 2015-02-28 DIAGNOSIS — F419 Anxiety disorder, unspecified: Secondary | ICD-10-CM | POA: Diagnosis present

## 2015-02-28 DIAGNOSIS — Z9889 Other specified postprocedural states: Secondary | ICD-10-CM | POA: Diagnosis not present

## 2015-02-28 DIAGNOSIS — J9811 Atelectasis: Secondary | ICD-10-CM | POA: Diagnosis not present

## 2015-02-28 DIAGNOSIS — J449 Chronic obstructive pulmonary disease, unspecified: Secondary | ICD-10-CM | POA: Diagnosis present

## 2015-02-28 DIAGNOSIS — I509 Heart failure, unspecified: Secondary | ICD-10-CM

## 2015-02-28 DIAGNOSIS — Z8049 Family history of malignant neoplasm of other genital organs: Secondary | ICD-10-CM | POA: Diagnosis not present

## 2015-02-28 DIAGNOSIS — I482 Chronic atrial fibrillation: Secondary | ICD-10-CM | POA: Diagnosis present

## 2015-02-28 DIAGNOSIS — J45909 Unspecified asthma, uncomplicated: Secondary | ICD-10-CM | POA: Diagnosis present

## 2015-02-28 DIAGNOSIS — J90 Pleural effusion, not elsewhere classified: Secondary | ICD-10-CM | POA: Diagnosis not present

## 2015-02-28 DIAGNOSIS — K219 Gastro-esophageal reflux disease without esophagitis: Secondary | ICD-10-CM | POA: Diagnosis not present

## 2015-02-28 DIAGNOSIS — D62 Acute posthemorrhagic anemia: Secondary | ICD-10-CM | POA: Diagnosis not present

## 2015-02-28 DIAGNOSIS — I472 Ventricular tachycardia: Secondary | ICD-10-CM | POA: Diagnosis not present

## 2015-02-28 DIAGNOSIS — F329 Major depressive disorder, single episode, unspecified: Secondary | ICD-10-CM | POA: Diagnosis present

## 2015-02-28 DIAGNOSIS — I429 Cardiomyopathy, unspecified: Secondary | ICD-10-CM | POA: Diagnosis not present

## 2015-02-28 DIAGNOSIS — E119 Type 2 diabetes mellitus without complications: Secondary | ICD-10-CM | POA: Diagnosis present

## 2015-02-28 DIAGNOSIS — I4891 Unspecified atrial fibrillation: Secondary | ICD-10-CM

## 2015-02-28 DIAGNOSIS — I428 Other cardiomyopathies: Secondary | ICD-10-CM | POA: Diagnosis not present

## 2015-02-28 DIAGNOSIS — Z801 Family history of malignant neoplasm of trachea, bronchus and lung: Secondary | ICD-10-CM | POA: Diagnosis not present

## 2015-02-28 DIAGNOSIS — Z452 Encounter for adjustment and management of vascular access device: Secondary | ICD-10-CM | POA: Diagnosis not present

## 2015-02-28 DIAGNOSIS — I2589 Other forms of chronic ischemic heart disease: Secondary | ICD-10-CM | POA: Diagnosis not present

## 2015-02-28 DIAGNOSIS — J939 Pneumothorax, unspecified: Secondary | ICD-10-CM

## 2015-02-28 DIAGNOSIS — R0602 Shortness of breath: Secondary | ICD-10-CM | POA: Diagnosis not present

## 2015-02-28 DIAGNOSIS — J9383 Other pneumothorax: Secondary | ICD-10-CM | POA: Diagnosis not present

## 2015-02-28 HISTORY — PX: EPICARDIAL PACING LEAD PLACEMENT: SHX6274

## 2015-02-28 HISTORY — PX: THORACOTOMY: SHX5074

## 2015-02-28 LAB — GLUCOSE, CAPILLARY
GLUCOSE-CAPILLARY: 112 mg/dL — AB (ref 65–99)
GLUCOSE-CAPILLARY: 151 mg/dL — AB (ref 65–99)
Glucose-Capillary: 138 mg/dL — ABNORMAL HIGH (ref 65–99)
Glucose-Capillary: 168 mg/dL — ABNORMAL HIGH (ref 65–99)

## 2015-02-28 SURGERY — THORACOTOMY, MAJOR
Anesthesia: General | Site: Chest

## 2015-02-28 MED ORDER — ENOXAPARIN SODIUM 40 MG/0.4ML ~~LOC~~ SOLN
40.0000 mg | Freq: Every day | SUBCUTANEOUS | Status: DC
  Administered 2015-03-01 – 2015-03-05 (×5): 40 mg via SUBCUTANEOUS
  Filled 2015-02-28 (×5): qty 0.4

## 2015-02-28 MED ORDER — SERTRALINE HCL 50 MG PO TABS
50.0000 mg | ORAL_TABLET | Freq: Every day | ORAL | Status: DC
  Administered 2015-03-01 – 2015-03-05 (×5): 50 mg via ORAL
  Filled 2015-02-28 (×5): qty 1

## 2015-02-28 MED ORDER — PROPOFOL 10 MG/ML IV BOLUS
INTRAVENOUS | Status: AC
  Filled 2015-02-28: qty 20

## 2015-02-28 MED ORDER — PROPOFOL 10 MG/ML IV BOLUS
INTRAVENOUS | Status: DC | PRN
  Administered 2015-02-28: 100 mg via INTRAVENOUS
  Administered 2015-02-28 (×2): 50 mg via INTRAVENOUS

## 2015-02-28 MED ORDER — GLYCOPYRROLATE 0.2 MG/ML IJ SOLN
INTRAMUSCULAR | Status: AC
  Filled 2015-02-28: qty 3

## 2015-02-28 MED ORDER — NEOSTIGMINE METHYLSULFATE 10 MG/10ML IV SOLN
INTRAVENOUS | Status: AC
  Filled 2015-02-28: qty 3

## 2015-02-28 MED ORDER — FUROSEMIDE 20 MG PO TABS
20.0000 mg | ORAL_TABLET | Freq: Every day | ORAL | Status: DC
  Administered 2015-02-28 – 2015-03-05 (×6): 20 mg via ORAL
  Filled 2015-02-28 (×6): qty 1

## 2015-02-28 MED ORDER — CHLORHEXIDINE GLUCONATE 0.12 % MT SOLN
15.0000 mL | Freq: Two times a day (BID) | OROMUCOSAL | Status: DC
  Administered 2015-02-28 – 2015-03-01 (×2): 15 mL via OROMUCOSAL
  Filled 2015-02-28 (×2): qty 15

## 2015-02-28 MED ORDER — MAGNESIUM OXIDE 400 MG PO TABS
400.0000 mg | ORAL_TABLET | Freq: Every day | ORAL | Status: DC
  Administered 2015-02-28 – 2015-03-05 (×6): 400 mg via ORAL
  Filled 2015-02-28 (×8): qty 1

## 2015-02-28 MED ORDER — HYDROMORPHONE HCL 1 MG/ML IJ SOLN
INTRAMUSCULAR | Status: AC
  Filled 2015-02-28: qty 2

## 2015-02-28 MED ORDER — VANCOMYCIN HCL IN DEXTROSE 1-5 GM/200ML-% IV SOLN
1000.0000 mg | Freq: Two times a day (BID) | INTRAVENOUS | Status: AC
  Administered 2015-02-28: 1000 mg via INTRAVENOUS
  Filled 2015-02-28: qty 200

## 2015-02-28 MED ORDER — ONDANSETRON HCL 4 MG/2ML IJ SOLN
INTRAMUSCULAR | Status: AC
  Filled 2015-02-28: qty 2

## 2015-02-28 MED ORDER — ACETAMINOPHEN 500 MG PO TABS
1000.0000 mg | ORAL_TABLET | Freq: Four times a day (QID) | ORAL | Status: AC
  Administered 2015-02-28 – 2015-03-05 (×20): 1000 mg via ORAL
  Filled 2015-02-28 (×23): qty 2

## 2015-02-28 MED ORDER — LACTATED RINGERS IV SOLN
INTRAVENOUS | Status: DC | PRN
  Administered 2015-02-28: 07:00:00 via INTRAVENOUS

## 2015-02-28 MED ORDER — CARVEDILOL 25 MG PO TABS
25.0000 mg | ORAL_TABLET | Freq: Two times a day (BID) | ORAL | Status: DC
  Administered 2015-02-28 – 2015-03-02 (×3): 25 mg via ORAL
  Filled 2015-02-28 (×8): qty 1

## 2015-02-28 MED ORDER — PHENYLEPHRINE 40 MCG/ML (10ML) SYRINGE FOR IV PUSH (FOR BLOOD PRESSURE SUPPORT)
PREFILLED_SYRINGE | INTRAVENOUS | Status: AC
  Filled 2015-02-28: qty 10

## 2015-02-28 MED ORDER — ONDANSETRON HCL 4 MG/2ML IJ SOLN
INTRAMUSCULAR | Status: DC | PRN
  Administered 2015-02-28: 4 mg via INTRAVENOUS

## 2015-02-28 MED ORDER — POTASSIUM CHLORIDE 10 MEQ/50ML IV SOLN
10.0000 meq | Freq: Every day | INTRAVENOUS | Status: DC | PRN
  Filled 2015-02-28 (×2): qty 50

## 2015-02-28 MED ORDER — MIDAZOLAM HCL 5 MG/5ML IJ SOLN
INTRAMUSCULAR | Status: DC | PRN
  Administered 2015-02-28 (×2): 1 mg via INTRAVENOUS

## 2015-02-28 MED ORDER — ARTIFICIAL TEARS OP OINT
TOPICAL_OINTMENT | OPHTHALMIC | Status: AC
  Filled 2015-02-28: qty 3.5

## 2015-02-28 MED ORDER — NEOSTIGMINE METHYLSULFATE 10 MG/10ML IV SOLN
INTRAVENOUS | Status: DC | PRN
  Administered 2015-02-28: 4 mg via INTRAVENOUS

## 2015-02-28 MED ORDER — HYDROMORPHONE HCL 1 MG/ML IJ SOLN: 0.2500 mg | INTRAMUSCULAR | Status: DC | PRN

## 2015-02-28 MED ORDER — FENTANYL CITRATE (PF) 250 MCG/5ML IJ SOLN
INTRAMUSCULAR | Status: AC
  Filled 2015-02-28: qty 5

## 2015-02-28 MED ORDER — DIPHENHYDRAMINE HCL 12.5 MG/5ML PO ELIX
12.5000 mg | ORAL_SOLUTION | Freq: Four times a day (QID) | ORAL | Status: DC | PRN
  Filled 2015-02-28: qty 5

## 2015-02-28 MED ORDER — DEXTROSE 5 % IV SOLN
1.5000 g | INTRAVENOUS | Status: DC | PRN
  Administered 2015-02-28: 1.5 g via INTRAVENOUS

## 2015-02-28 MED ORDER — PANTOPRAZOLE SODIUM 40 MG PO TBEC
40.0000 mg | DELAYED_RELEASE_TABLET | Freq: Every day | ORAL | Status: DC
  Administered 2015-03-01 – 2015-03-05 (×4): 40 mg via ORAL
  Filled 2015-02-28 (×4): qty 1

## 2015-02-28 MED ORDER — MEPERIDINE HCL 25 MG/ML IJ SOLN: 6.2500 mg | INTRAMUSCULAR | Status: DC | PRN

## 2015-02-28 MED ORDER — EPHEDRINE SULFATE 50 MG/ML IJ SOLN
INTRAMUSCULAR | Status: AC
  Filled 2015-02-28: qty 1

## 2015-02-28 MED ORDER — LISINOPRIL 10 MG PO TABS
10.0000 mg | ORAL_TABLET | Freq: Every day | ORAL | Status: DC
  Filled 2015-02-28 (×2): qty 1

## 2015-02-28 MED ORDER — ROCURONIUM BROMIDE 100 MG/10ML IV SOLN
INTRAVENOUS | Status: DC | PRN
  Administered 2015-02-28: 10 mg via INTRAVENOUS
  Administered 2015-02-28: 40 mg via INTRAVENOUS

## 2015-02-28 MED ORDER — SODIUM CHLORIDE 0.9 % IJ SOLN: 9.0000 mL | INTRAMUSCULAR | Status: DC | PRN

## 2015-02-28 MED ORDER — CETYLPYRIDINIUM CHLORIDE 0.05 % MT LIQD: 7.0000 mL | Freq: Two times a day (BID) | OROMUCOSAL | Status: DC

## 2015-02-28 MED ORDER — LACTATED RINGERS IV SOLN
INTRAVENOUS | Status: DC | PRN
  Administered 2015-02-28: 08:00:00 via INTRAVENOUS

## 2015-02-28 MED ORDER — SENNOSIDES-DOCUSATE SODIUM 8.6-50 MG PO TABS
1.0000 | ORAL_TABLET | Freq: Every day | ORAL | Status: DC
  Administered 2015-03-01 – 2015-03-03 (×2): 1 via ORAL
  Filled 2015-02-28 (×7): qty 1

## 2015-02-28 MED ORDER — GLYCOPYRROLATE 0.2 MG/ML IJ SOLN
INTRAMUSCULAR | Status: DC | PRN
  Administered 2015-02-28: 0.6 mg via INTRAVENOUS

## 2015-02-28 MED ORDER — MIDAZOLAM HCL 2 MG/2ML IJ SOLN
INTRAMUSCULAR | Status: AC
  Filled 2015-02-28: qty 2

## 2015-02-28 MED ORDER — INSULIN ASPART 100 UNIT/ML ~~LOC~~ SOLN
0.0000 [IU] | SUBCUTANEOUS | Status: DC
  Administered 2015-02-28: 4 [IU] via SUBCUTANEOUS
  Administered 2015-02-28 (×2): 2 [IU] via SUBCUTANEOUS
  Administered 2015-03-01: 4 [IU] via SUBCUTANEOUS
  Administered 2015-03-01: 2 [IU] via SUBCUTANEOUS

## 2015-02-28 MED ORDER — ZOLPIDEM TARTRATE 5 MG PO TABS
5.0000 mg | ORAL_TABLET | Freq: Every day | ORAL | Status: DC
  Administered 2015-03-01 – 2015-03-04 (×4): 5 mg via ORAL
  Filled 2015-02-28 (×4): qty 1

## 2015-02-28 MED ORDER — NEOSTIGMINE METHYLSULFATE 10 MG/10ML IV SOLN
INTRAVENOUS | Status: AC
  Filled 2015-02-28: qty 1

## 2015-02-28 MED ORDER — STERILE WATER FOR INJECTION IJ SOLN
INTRAMUSCULAR | Status: AC
  Filled 2015-02-28: qty 10

## 2015-02-28 MED ORDER — PROMETHAZINE HCL 25 MG/ML IJ SOLN: 6.2500 mg | INTRAMUSCULAR | Status: DC | PRN

## 2015-02-28 MED ORDER — CALCIUM CARBONATE ANTACID 500 MG PO CHEW
2.0000 | CHEWABLE_TABLET | Freq: Three times a day (TID) | ORAL | Status: DC
  Administered 2015-02-28 – 2015-03-05 (×13): 400 mg via ORAL
  Filled 2015-02-28 (×18): qty 2

## 2015-02-28 MED ORDER — ACETAMINOPHEN 160 MG/5ML PO SOLN
1000.0000 mg | Freq: Four times a day (QID) | ORAL | Status: AC
  Filled 2015-02-28: qty 40

## 2015-02-28 MED ORDER — ROCURONIUM BROMIDE 50 MG/5ML IV SOLN
INTRAVENOUS | Status: AC
  Filled 2015-02-28: qty 3

## 2015-02-28 MED ORDER — FENTANYL CITRATE (PF) 250 MCG/5ML IJ SOLN
INTRAMUSCULAR | Status: DC | PRN
  Administered 2015-02-28: 50 ug via INTRAVENOUS
  Administered 2015-02-28 (×2): 100 ug via INTRAVENOUS
  Administered 2015-02-28: 50 ug via INTRAVENOUS

## 2015-02-28 MED ORDER — DIPHENHYDRAMINE HCL 50 MG/ML IJ SOLN: 12.5000 mg | Freq: Four times a day (QID) | INTRAMUSCULAR | Status: DC | PRN

## 2015-02-28 MED ORDER — PHENYLEPHRINE HCL 10 MG/ML IJ SOLN
INTRAMUSCULAR | Status: AC
  Filled 2015-02-28: qty 1

## 2015-02-28 MED ORDER — ONDANSETRON HCL 4 MG/2ML IJ SOLN: 4.0000 mg | Freq: Four times a day (QID) | INTRAMUSCULAR | Status: DC | PRN

## 2015-02-28 MED ORDER — FENTANYL 10 MCG/ML IV SOLN
INTRAVENOUS | Status: DC
  Administered 2015-02-28: 11:00:00 via INTRAVENOUS
  Filled 2015-02-28: qty 50

## 2015-02-28 MED ORDER — SUCCINYLCHOLINE CHLORIDE 20 MG/ML IJ SOLN
INTRAMUSCULAR | Status: AC
  Filled 2015-02-28: qty 1

## 2015-02-28 MED ORDER — TIOTROPIUM BROMIDE MONOHYDRATE 18 MCG IN CAPS
18.0000 ug | ORAL_CAPSULE | Freq: Every day | RESPIRATORY_TRACT | Status: DC
  Administered 2015-02-28 – 2015-03-05 (×6): 18 ug via RESPIRATORY_TRACT
  Filled 2015-02-28 (×2): qty 5

## 2015-02-28 MED ORDER — FENTANYL 10 MCG/ML IV SOLN
INTRAVENOUS | Status: DC
  Administered 2015-02-28: 135 ug via INTRAVENOUS
  Administered 2015-02-28: 15 ug via INTRAVENOUS
  Administered 2015-03-01: 45 ug via INTRAVENOUS
  Administered 2015-03-01: 15 ug via INTRAVENOUS
  Administered 2015-03-01: 30 ug via INTRAVENOUS

## 2015-02-28 MED ORDER — DEXTROSE 5 % IV SOLN
1.5000 g | Freq: Two times a day (BID) | INTRAVENOUS | Status: AC
  Administered 2015-02-28 – 2015-03-01 (×2): 1.5 g via INTRAVENOUS
  Filled 2015-02-28 (×2): qty 1.5

## 2015-02-28 MED ORDER — NALOXONE HCL 0.4 MG/ML IJ SOLN: 0.4000 mg | INTRAMUSCULAR | Status: DC | PRN

## 2015-02-28 MED ORDER — DEXTROSE-NACL 5-0.45 % IV SOLN
INTRAVENOUS | Status: DC
  Administered 2015-02-28 – 2015-03-02 (×2): via INTRAVENOUS

## 2015-02-28 MED ORDER — BISACODYL 5 MG PO TBEC
10.0000 mg | DELAYED_RELEASE_TABLET | Freq: Every day | ORAL | Status: DC
  Administered 2015-02-28 – 2015-03-05 (×5): 10 mg via ORAL
  Filled 2015-02-28 (×6): qty 2

## 2015-02-28 MED ORDER — ALBUTEROL SULFATE HFA 108 (90 BASE) MCG/ACT IN AERS: 2.0000 | INHALATION_SPRAY | Freq: Four times a day (QID) | RESPIRATORY_TRACT | Status: DC | PRN

## 2015-02-28 MED ORDER — ALBUTEROL SULFATE (2.5 MG/3ML) 0.083% IN NEBU: 2.5000 mg | INHALATION_SOLUTION | Freq: Four times a day (QID) | RESPIRATORY_TRACT | Status: DC | PRN

## 2015-02-28 MED ORDER — OXYCODONE HCL 5 MG PO TABS
5.0000 mg | ORAL_TABLET | ORAL | Status: DC | PRN
  Administered 2015-03-01 (×2): 5 mg via ORAL
  Administered 2015-03-02: 10 mg via ORAL
  Filled 2015-02-28: qty 1
  Filled 2015-02-28: qty 2
  Filled 2015-02-28: qty 1

## 2015-02-28 MED ORDER — 0.9 % SODIUM CHLORIDE (POUR BTL) OPTIME
TOPICAL | Status: DC | PRN
  Administered 2015-02-28: 1000 mL

## 2015-02-28 MED ORDER — LIDOCAINE HCL (CARDIAC) 20 MG/ML IV SOLN
INTRAVENOUS | Status: AC
  Filled 2015-02-28: qty 10

## 2015-02-28 SURGICAL SUPPLY — 66 items
BENZOIN TINCTURE PRP APPL 2/3 (GAUZE/BANDAGES/DRESSINGS) IMPLANT
CANISTER SUCTION 2500CC (MISCELLANEOUS) ×8 IMPLANT
CATH KIT ON Q 5IN SLV (PAIN MANAGEMENT) IMPLANT
CATH THORACIC 28FR (CATHETERS) ×4 IMPLANT
CATH THORACIC 36FR (CATHETERS) IMPLANT
CATH THORACIC 36FR RT ANG (CATHETERS) IMPLANT
CLIP TI MEDIUM 6 (CLIP) ×4 IMPLANT
CONN ST 1/4X3/8  BEN (MISCELLANEOUS)
CONN ST 1/4X3/8 BEN (MISCELLANEOUS) IMPLANT
CONN Y 3/8X3/8X3/8  BEN (MISCELLANEOUS)
CONN Y 3/8X3/8X3/8 BEN (MISCELLANEOUS) IMPLANT
CONT SPEC 4OZ CLIKSEAL STRL BL (MISCELLANEOUS) ×8 IMPLANT
COVER SURGICAL LIGHT HANDLE (MISCELLANEOUS) ×8 IMPLANT
DERMABOND ADVANCED (GAUZE/BANDAGES/DRESSINGS) ×4
DERMABOND ADVANCED .7 DNX12 (GAUZE/BANDAGES/DRESSINGS) ×4 IMPLANT
DRAIN CHANNEL 28F RND 3/8 FF (WOUND CARE) IMPLANT
DRAIN CHANNEL 32F RND 10.7 FF (WOUND CARE) IMPLANT
DRAPE LAPAROSCOPIC ABDOMINAL (DRAPES) ×4 IMPLANT
DRAPE WARM FLUID 44X44 (DRAPE) ×4 IMPLANT
DRILL BIT 7/64X5 (BIT) IMPLANT
ELECT BLADE 4.0 EZ CLEAN MEGAD (MISCELLANEOUS) ×4
ELECT REM PT RETURN 9FT ADLT (ELECTROSURGICAL) ×4
ELECTRODE BLDE 4.0 EZ CLN MEGD (MISCELLANEOUS) ×2 IMPLANT
ELECTRODE REM PT RTRN 9FT ADLT (ELECTROSURGICAL) ×2 IMPLANT
GAUZE SPONGE 4X4 12PLY STRL (GAUZE/BANDAGES/DRESSINGS) ×4 IMPLANT
GLOVE EUDERMIC 7 POWDERFREE (GLOVE) ×8 IMPLANT
GOWN STRL REUS W/ TWL LRG LVL3 (GOWN DISPOSABLE) ×4 IMPLANT
GOWN STRL REUS W/ TWL XL LVL3 (GOWN DISPOSABLE) ×2 IMPLANT
GOWN STRL REUS W/TWL LRG LVL3 (GOWN DISPOSABLE) ×4
GOWN STRL REUS W/TWL XL LVL3 (GOWN DISPOSABLE) ×2
KIT BASIN OR (CUSTOM PROCEDURE TRAY) ×4 IMPLANT
KIT PLEURAL DRAIN ASPIRA BL (MISCELLANEOUS) ×4 IMPLANT
KIT ROOM TURNOVER OR (KITS) ×4 IMPLANT
LEAD PACING EPI (Prosthesis & Implant Heart) ×8 IMPLANT
NS IRRIG 1000ML POUR BTL (IV SOLUTION) ×16 IMPLANT
PACK CHEST (CUSTOM PROCEDURE TRAY) ×4 IMPLANT
PAD ARMBOARD 7.5X6 YLW CONV (MISCELLANEOUS) ×8 IMPLANT
SEALANT SURG COSEAL 4ML (VASCULAR PRODUCTS) ×4 IMPLANT
SEALANT SURG COSEAL 8ML (VASCULAR PRODUCTS) IMPLANT
SOLUTION ANTI FOG 6CC (MISCELLANEOUS) ×4 IMPLANT
SUT PROLENE 3 0 SH DA (SUTURE) IMPLANT
SUT SILK  1 MH (SUTURE) ×4
SUT SILK 1 MH (SUTURE) ×4 IMPLANT
SUT SILK 1 TIES 10X30 (SUTURE) IMPLANT
SUT SILK 2 0 SH (SUTURE) ×8 IMPLANT
SUT SILK 2 0SH CR/8 30 (SUTURE) IMPLANT
SUT SILK 3 0SH CR/8 30 (SUTURE) IMPLANT
SUT VIC AB 1 CTX 36 (SUTURE) ×2
SUT VIC AB 1 CTX36XBRD ANBCTR (SUTURE) ×2 IMPLANT
SUT VIC AB 2-0 CT1 27 (SUTURE)
SUT VIC AB 2-0 CT1 TAPERPNT 27 (SUTURE) IMPLANT
SUT VIC AB 2-0 CTX 36 (SUTURE) ×4 IMPLANT
SUT VIC AB 2-0 UR6 27 (SUTURE) IMPLANT
SUT VIC AB 3-0 MH 27 (SUTURE) IMPLANT
SUT VIC AB 3-0 SH 27 (SUTURE) ×2
SUT VIC AB 3-0 SH 27X BRD (SUTURE) ×2 IMPLANT
SUT VIC AB 3-0 X1 27 (SUTURE) ×8 IMPLANT
SUT VICRYL 2 TP 1 (SUTURE) ×4 IMPLANT
SYSTEM SAHARA CHEST DRAIN ATS (WOUND CARE) ×4 IMPLANT
TIP APPLICATOR SPRAY EXTEND 16 (VASCULAR PRODUCTS) IMPLANT
TOWEL OR 17X24 6PK STRL BLUE (TOWEL DISPOSABLE) ×4 IMPLANT
TOWEL OR 17X26 10 PK STRL BLUE (TOWEL DISPOSABLE) ×4 IMPLANT
TRAP SPECIMEN MUCOUS 40CC (MISCELLANEOUS) IMPLANT
TRAY FOLEY CATH 14FRSI W/METER (CATHETERS) ×4 IMPLANT
TUNNELER SHEATH ON-Q 11GX8 DSP (PAIN MANAGEMENT) IMPLANT
WATER STERILE IRR 1000ML POUR (IV SOLUTION) ×8 IMPLANT

## 2015-02-28 NOTE — Anesthesia Preprocedure Evaluation (Addendum)
Anesthesia Evaluation  Patient identified by MRN, date of birth, ID band Patient awake    Reviewed: Allergy & Precautions, H&P , NPO status , Patient's Chart, lab work & pertinent test results, reviewed documented beta blocker date and time   Airway Mallampati: II  TM Distance: >3 FB Neck ROM: Full    Dental no notable dental hx. (+) Teeth Intact, Dental Advisory Given   Pulmonary shortness of breath, COPD breath sounds clear to auscultation  Pulmonary exam normal + decreased breath sounds      Cardiovascular hypertension, Pt. on medications and Pt. on home beta blockers +CHF Normal cardiovascular exam+ dysrhythmias Atrial Fibrillation + pacemaker + Valvular Problems/Murmurs AI and MR Rhythm:Regular Rate:Normal  10/2013 Echo - Left ventricle: The cavity size was moderately dilated. Wall thickness was increased in a pattern of mild LVH. Systolic function was moderately to severely reduced. The estimated ejection fraction was in the range of 30% to 35%. Diffuse hypokinesis. There is severe hypokinesis of the anteroseptal myocardium. Doppler parameters are consistent with restrictive physiology, indicative of decreased left ventricular diastolic compliance and/or increased left atrial pressure. - Ventricular septum: Septal motion showed abnormal function and dyssynergy. - Aortic valve: Trileaflet; mildly thickened leaflets. Mild to moderate regurgitation. - Mitral valve: Mildly thickened leaflets . Moderate regurgitation, appears to be made up of two jets. Suboptimal PISA. - Left atrium: The atrium was severely dilated. - Right ventricle: The cavity size was mildly dilated. Pacer wire or catheter noted in right ventricle. - Right atrium: The atrium was moderately to severely dilated. - Tricuspid valve: Mild-moderate regurgitation. - Pulmonic valve: Mild regurgitation. - Pulmonary arteries: Systolic pressure was moderately increased. -  Pericardium, extracardiac: There was no pericardial effusion.  Impressions: - Mild LVH with moderate chamber dilatation and LVEF 30-35% with diffuse hypokinesis, most prominent anteroseptal wallin setting of septal dyssynergy and pacing. Restrictivediastolic filling pattern with increased filling pressures. Severe left atrial and moderate to severe right atrial enlargement. MIldly thickened mitral valve with overall moderate mitral regurgitation made up of two jets (PISA suboptimal). Mild to moderate aortic regurgitation. Device wire in right heart. MIld to moderate tricuspid regurgitation with PASP 51 mmHg.   Neuro/Psych Anxiety    GI/Hepatic GERD-  ,  Endo/Other  diabetes  Renal/GU      Musculoskeletal   Abdominal   Peds  Hematology  (+) anemia ,   Anesthesia Other Findings   Reproductive/Obstetrics                           Anesthesia Physical  Anesthesia Plan  ASA: III  Anesthesia Plan: General   Post-op Pain Management:    Induction: Intravenous  Airway Management Planned: Oral ETT  Additional Equipment:   Intra-op Plan:   Post-operative Plan: Extubation in OR  Informed Consent: I have reviewed the patients History and Physical, chart, labs and discussed the procedure including the risks, benefits and alternatives for the proposed anesthesia with the patient or authorized representative who has indicated his/her understanding and acceptance.   Dental advisory given  Plan Discussed with: CRNA, Anesthesiologist and Surgeon  Anesthesia Plan Comments:       Anesthesia Quick Evaluation

## 2015-02-28 NOTE — Progress Notes (Signed)
      [address]       [place] [address]             [phone]       C/o incisional pain  BP 140/74 mmHg  Pulse 70  Temp(Src) 98.3 F (36.8 C) (Oral)  Resp 21  Ht 5\' 4"  (1.626 m)  Wt 143 lb (64.864 kg)  BMI 24.53 kg/m2  SpO2 92%   Intake/Output Summary (Last 24 hours) at 02/28/15 1853 Last data filed at 02/28/15 1800  Gross per 24 hour  Intake    700 ml  Output    925 ml  Net   -225 ml    Serosanguinous drainage from CT, 300 ml out so far  Doing well early postop  Viviann Spare C. Dorris Fetch, MD [facility] [phone]

## 2015-02-28 NOTE — Anesthesia Postprocedure Evaluation (Signed)
Anesthesia Post Note  Patient: [name] [name]  Procedure(s) Performed: Procedure(s) (LRB): THORACOTOMY MAJOR (Left) LV EPICARDIAL PACING LEAD PLACEMENT (N/A)  Anesthesia type: General  Patient location: PACU  Post pain: Pain level controlled  Post assessment: Post-op Vital signs reviewed  Last Vitals: BP 125/68 mmHg  Pulse 70  Temp(Src) 36.8 C (Oral)  Resp 16  Ht 5\' 4"  (1.626 m)  Wt 143 lb (64.864 kg)  BMI 24.53 kg/m2  SpO2 95%  Post vital signs: Reviewed  Level of consciousness: sedated  Complications: No apparent anesthesia complications

## 2015-02-28 NOTE — Transfer of Care (Signed)
Immediate Anesthesia Transfer of Care Note  Patient: [name] [name]  Procedure(s) Performed: Procedure(s): THORACOTOMY MAJOR (Left) LV EPICARDIAL PACING LEAD PLACEMENT (N/A)  Patient Location: PACU  Anesthesia Type:General  Level of Consciousness: awake  Airway & Oxygen Therapy: Patient Spontanous Breathing and Patient connected to face mask oxygen  Post-op Assessment: Report given to RN and Post -op Vital signs reviewed and stable  Post vital signs: Reviewed and stable  Last Vitals:  Filed Vitals:   02/28/15 0542  BP: 127/71  Pulse: 73  Temp: 36.6 C  Resp: 18    Complications: No apparent anesthesia complications

## 2015-02-28 NOTE — Op Note (Signed)
CARDIOTHORACIC SURGERY OPERATIVE NOTE  02/28/2015 MEAGHAN SIBOLE [id number]  Surgeon:  Alleen Borne, MD  First Assistant: none   Preoperative Diagnosis:  Non-ischemic cardiomyopathy  Postoperative Diagnosis: same  Procedure:  1. Left anterolateral thoracotomy 2. Insertion of 2 left ventricular epicardial pacing leads 3. Revision of Biventricular pacing system  Anesthesia:  General Endotracheal   Clinical History/Surgical Indication:  The patient is a 59 year old woman with a history of non-ischemic cardiomyopathy with an EF of 30-35% by echo in 10/2013 with restrictive diastolic dysfunction and chronic atrial fibrillation who underwent insertion of a BiV pacer via the left subclavian vein on 06/21/2012 followed by AV ablation the following day. She had an LV lead placed via the coronary sinus with some difficulty due to the anatomy and there was a very low threshold for diaphragmatic pacing so the lead was turned off. She has been followed by Dr. Ladona Ridgel and he felt that it is time to place an LV epicardial lead for BiV pacing. I saw her in March and she wanted to wait until she had medicare coverage which she now has.  She has non-ischemic cardiomyopathy with chronic atrial fibrillation and RV pacing s/p AV node ablation. She has NYHA class II symptoms of exertional shortness of breath and fatigue with normal daily activity such as walking on level ground. I agree with the need by biventricular pacing in this patient. I discussed the surgical procedure of LV epicardial pacing lead placement via a left thoracotomy and revision of her pacer to a BiV system. I discussed the alternative of no surgery, benefits and risks including but not limited to bleeding, infection, injury to the heart, lead malfunction, and the possibility that it may not improve her symptomatically and may not improve her LVEF. She understands and would like to proceed.  Preparation:  The patient was seen in the  preoperative holding area and the correct patient, correct operation, correct operative side were confirmed with the patient after reviewing the medical record. The consent was signed by me. Preoperative antibiotics were given.  The patient was taken back to the operating room and positioned supine on the operating room table. After being placed under general endotracheal anesthesia by the anesthesia team using a double lumen tube a foley catheter was placed. A roll was place beneath the left back to tilt the left side up slightly. The chest was prepped with betadine soap and solution.  A surgical time-out was taken and the correct patient,operative side, and operative procedure were confirmed with the nursing and anesthesia staff.   Operative Procedure:  A short anterolateral thoracotomy incision was made below the pectoralis border. The subcutaneous tissue was divided using electrocautery. The serratus muscle was split along its fibers. The pleural space was entered through the 6th intercostal space. The pericardium was immediately adjacent to the chest wall due to cardiac enlargement. The pericardium was opened. A site was chosen for pacing lead insertion on the mid-lateral wall. A Medtronic pacing lead was screwed into the myocardium ( model 5071-53 cm, SN [id number]). It was tested and the Rs was 5.0 mv. Impedence was 776. Threshold was 0.3 V@ 0.2ms. Another lead of the same type was screwed into the myocardium about 2 cm away from the first ( model 5071-53 cm, SN [id number]). Testing showed an Rs of 8.6 mv, Impedence of 642, and a threshold of 0.5 V@ 0.5 ms. The generator pocket was opened through the prior incision.  The generator was removed. The two LV  epicardial leads were brought out through the interspace and tunneled along the subcutaneous tissue of the chest wall up to the generator pocket. The first lead ([id number]) was connected to the generator and the other lead was capped. The pocket was  enlarged slightly inferiorly. The generator was replaced in the pocket and the excess lead coiled behind it. The pocket was irrigated with saline. The subcutaneous tissue was closed with 3-0 vicryl continuous suture. The skin was closed with 4-0 vicryl subcuticular suture. A 28 F chest tube was placed in the left pleural space through a separate stab incision. The thoracotomy incision was closed using continuous #0 vicryl suture for the serratus muscle, 2-0 vicryl subcutaneous suture and 3-0 vicryl subcuticular suture. Dermabond was applied.  All sponge, needle, and instrument counts were reported correct at the end of the case. Dry sterile dressings were placed over the incisions and around the chest tubes which were connected to pleurevac suction. The patient was extubated and transported to the PACU in satisfactory and stable condition.

## 2015-02-28 NOTE — H&P (Signed)
[address]       [name] [address]             [phone]      Cardiothoracic Surgery Admission History and Physical   PCP is Fredirick Maudlin, MD Referring Provider is Marinus Maw, MD  Chief Complaint  Patient presents with  . Congestive Heart Failure    eval for epicardial lead placement    HPI:  The patient is a 59 year old woman with a history of non-ischemic cardiomyopathy with an EF of 30-35% by echo in 10/2013 with restrictive diastolic dysfunction and chronic atrial fibrillation who underwent insertion of a BiV pacer via the left subclavian vein on 06/21/2012 followed by AV ablation the following day. She had an LV lead placed via the coronary sinus with some difficulty due to the anatomy and there was a very low threshold for diaphragmatic pacing so the lead was turned off. She has been followed by Dr. Ladona Ridgel and he felt that it is time to place an LV epicardial lead for BiV pacing. I saw her in March and she wanted to wait until she had medicare coverage which she now has.  Past Medical History  Diagnosis Date  . COPD (chronic obstructive pulmonary disease)   . Anxiety and depression   . Atrial fibrillation     Echocardiogram in 2004-borderline LV function; no valvular abnormalities; 06/2012: AV nodal ablation + biventricular pacemaker  . Anemia     2011: Hemoglobin of 11.4; hematocrit of 33.5; normal MCV  . Cardiac arrest 06/2012    16 day hospitalization; caused by hyperkalemia; multiple complications including CHF; permanent atrial fibrillation requiring AV nodal ablation + pacing  . Thyroid disease   . Nonischemic cardiomyopathy     Echo 06/2012: EF 30-35%, moderate to severe MR; cardiac cath 06/2012:30-40% D1 and D2, estimated EF-35%, MR-not graded; right bundle branch block    Past Surgical History  Procedure Laterality Date  . Breast biopsy      left, [facility]  .  Cervical conization w/bx  08/11/2011    Procedure: CONIZATION CERVIX WITH BIOPSY; Surgeon: Lazaro Arms, MD; Location: [facility]; Service: Gynecology; Laterality: N/A; Laser Conization of Cervix  . Breast excisional biopsy      Left x2  . Tee without cardioversion  06/19/2012    Procedure: TRANSESOPHAGEAL ECHOCARDIOGRAM (TEE); Surgeon: Vesta Mixer, MD; Location: [facility] ENDOSCOPY; Service: Cardiovascular; Laterality: N/A;  . Cardioversion  06/19/2012    Procedure: CARDIOVERSION; Surgeon: Vesta Mixer, MD; Location: [facility] ENDOSCOPY; Service: Cardiovascular;;  . Cardioversion  06/21/2012    Procedure: CARDIOVERSION; Surgeon: Cassell Clement, MD; Location: [facility] OR; Service: Cardiovascular; Laterality: N/A;  . Left heart catheterization with coronary angiogram N/A 06/15/2012    Procedure: LEFT HEART CATHETERIZATION WITH CORONARY ANGIOGRAM; Surgeon: Herby Abraham, MD; Location: [facility] CATH LAB; Service: Cardiovascular; Laterality: N/A;  . Permanent pacemaker insertion N/A 06/21/2012    Procedure: PERMANENT PACEMAKER INSERTION; Surgeon: Marinus Maw, MD; Location: [facility] CATH LAB; Service: Cardiovascular; Laterality: N/A;  . Av node ablation N/A 06/21/2012    Procedure: AV NODE ABLATION; Surgeon: Marinus Maw, MD; Location: [facility] CATH LAB; Service: Cardiovascular; Laterality: N/A;  . Av node ablation N/A 06/22/2012    Procedure: AV NODE ABLATION; Surgeon: Duke Salvia, MD; Location: [facility] CATH LAB; Service: Cardiovascular; Laterality: N/A;    Family History  Problem Relation Age of Onset  . Anesthesia problems Neg Hx   . Hypotension Neg Hx   .  Malignant hyperthermia Neg Hx   . Pseudochol deficiency Neg Hx   . Cancer Father     carcinoma of the lung  . Cancer Sister     carcinoma of the lung  . Colon cancer Neg Hx   . Cervical cancer Mother   . Irritable bowel  syndrome      mother, sister  . Celiac disease Neg Hx   . Inflammatory bowel disease Neg Hx     Social History History  Substance Use Topics  . Smoking status: Never Smoker   . Smokeless tobacco: Never Used  . Alcohol Use: No    Current Outpatient Prescriptions  Medication Sig Dispense Refill  . calcium carbonate (TUMS - DOSED IN MG ELEMENTAL CALCIUM) 500 MG chewable tablet Chew 2 tablets by mouth 3 (three) times daily.    . carvedilol (COREG) 25 MG tablet Take 1 tablet (25 mg total) by mouth 2 (two) times daily. 60 tablet 6  . furosemide (LASIX) 40 MG tablet Take 20 mg by mouth daily.    Marland Kitchen lisinopril (PRINIVIL,ZESTRIL) 10 MG tablet Take 1 tablet (10 mg total) by mouth daily. 30 tablet 6  . pantoprazole (PROTONIX) 40 MG tablet Take 40 mg by mouth daily.    . rivaroxaban (XARELTO) 20 MG TABS tablet Take 1 tablet (20 mg total) by mouth daily with supper. 30 tablet 0  . sertraline (ZOLOFT) 50 MG tablet Take 50 mg by mouth daily.    Marland Kitchen SPIRIVA HANDIHALER 18 MCG inhalation capsule Place 18 mcg into inhaler and inhale daily.     Marland Kitchen zolpidem (AMBIEN) 5 MG tablet Take 5 mg by mouth at bedtime as needed. For sleep     No current facility-administered medications for this visit.    No Known Allergies  Review of Systems  Constitutional: Positive for fatigue. Negative for activity change, appetite change and unexpected weight change.  HENT: Positive for hearing loss.  Eyes: Positive for visual disturbance.  Respiratory: Positive for shortness of breath and wheezing.  Cardiovascular: Negative for chest pain, palpitations and leg swelling.  Gastrointestinal: Positive for diarrhea and constipation.  Endocrine: Negative.  Genitourinary: Negative.  Musculoskeletal: Negative.  Skin: Negative.  Allergic/Immunologic: Negative.  Neurological: Negative.  Hematological: Bruises/bleeds easily.    Psychiatric/Behavioral: Positive for dysphoric mood. The patient is nervous/anxious.    BP 93/57 mmHg  Pulse 84  Resp 20  Ht 5\' 4"  (1.626 m)  Wt 140 lb (63.504 kg)  BMI 24.02 kg/m2  SpO2 98% Physical Exam  Constitutional: She is oriented to person, place, and time. She appears well-developed and well-nourished. No distress.  HENT:  Head: Normocephalic and atraumatic.  Mouth/Throat: Oropharynx is clear and moist.  Eyes: EOM are normal. Pupils are equal, round, and reactive to light.  Neck: Normal range of motion. Neck supple. No thyromegaly present.  Cardiovascular: Normal rate, regular rhythm, normal heart sounds and intact distal pulses.  No murmur heard. Pulmonary/Chest: Effort normal and breath sounds normal. No respiratory distress.  Pacer left subclavicular region  Abdominal: Soft. Bowel sounds are normal. She exhibits no distension and no mass. There is no tenderness.  Musculoskeletal: Normal range of motion. She exhibits no edema.  Lymphadenopathy:   She has no cervical adenopathy.  Neurological: She is alert and oriented to person, place, and time. She has normal strength. No cranial nerve deficit or sensory deficit.  Skin: Skin is warm and dry.  Psychiatric: She has a normal mood and affect.     Diagnostic Tests:  None  Impression:  She has non-ischemic cardiomyopathy with chronic atrial fibrillation and RV pacing s/p AV node ablation. She has NYHA class II symptoms of exertional shortness of breath and fatigue with normal daily activity such as walking on level ground. I agree with the need by biventricular pacing in this patient. I discussed the surgical procedure of LV epicardial pacing lead placement via a left thoracotomy and revision of her pacer to a BiV system. I discussed the alternative of no surgery, benefits and risks including but not limited to bleeding, infection, injury to the heart, lead malfunction, and the possibility that it may not improve  her symptomatically and may not improve her LVEF. She understands and would like to proceed.

## 2015-02-28 NOTE — Anesthesia Procedure Notes (Addendum)
Anesthesia Procedure Note Central line insertion note. Skin prepped and draped in sterile fashion. Patent vessel identified on u/s using linear probe. Needle advanced under live u/s guidance with aspiration of blood upon entry into vessel. Catheter passed easily over finder needle. Wire passed easily through catheter and location confirmed with u/s. Image saved in chart. Double lumen catheter advanced over wire, with aspiration of blood through all ports for confirmation. Line sutured and dressing applied. Pt tolerated well with no immediate complications.   Procedure Name: Intubation Date/Time: 02/28/2015 8:51 AM Performed by: Brien Mates D Pre-anesthesia Checklist: Patient identified, Emergency Drugs available, Suction available, Patient being monitored and Timeout performed Patient Re-evaluated:Patient Re-evaluated prior to inductionOxygen Delivery Method: Circle system utilized Preoxygenation: Pre-oxygenation with 100% oxygen Intubation Type: IV induction Ventilation: Mask ventilation without difficulty Laryngoscope Size: Mac and 3 Grade View: Grade I Tube type: Oral Endobronchial tube: Left, Double lumen EBT, EBT position confirmed by fiberoptic bronchoscope and EBT position confirmed by auscultation and 37 Fr Number of attempts: 1 Airway Equipment and Method: Stylet and Fiberoptic brochoscope (FOB used to verify ETT placement.) Placement Confirmation: ETT inserted through vocal cords under direct vision,  positive ETCO2 and breath sounds checked- equal and bilateral Secured at: 29 cm Tube secured with: Tape Dental Injury: Teeth and Oropharynx as per pre-operative assessment

## 2015-02-28 NOTE — Brief Op Note (Signed)
02/28/2015  10:16 AM  PATIENT:  [name] [name]  59 y.o. female  PRE-OPERATIVE DIAGNOSIS:  HEART FAILURE  POST-OPERATIVE DIAGNOSIS:  HEART FAILURE  PROCEDURE:  Procedure(s): THORACOTOMY MAJOR (Left) LV EPICARDIAL PACING LEAD PLACEMENT (N/A)  SURGEON:  Surgeon(s) and Role:    * Alleen Borne, MD - Primary  PHYSICIAN ASSISTANT: none  ASSISTANTS: none   ANESTHESIA:   general  EBL:  Total I/O In: 700 [I.V.:700] Out: 300 [Urine:250; Blood:50]  BLOOD ADMINISTERED:none  DRAINS: 1 28 F  Chest Tube(s) in the left pleural space   LOCAL MEDICATIONS USED:  NONE  SPECIMEN:  No Specimen  DISPOSITION OF SPECIMEN:  N/A  COUNTS:  YES  TOURNIQUET:  * No tourniquets in log *  DICTATION: .Note written in EPIC  PLAN OF CARE: Admit to inpatient   PATIENT DISPOSITION:  PACU - hemodynamically stable.   Delay start of Pharmacological VTE agent (>24hrs) due to surgical blood loss or risk of bleeding: yes

## 2015-02-28 NOTE — Interval H&P Note (Signed)
History and Physical Interval Note:  02/28/2015 7:20 AM  [name] [name]  has presented today for surgery, with the diagnosis of HEART FAILURE  The various methods of treatment have been discussed with the patient and family. After consideration of risks, benefits and other options for treatment, the patient has consented to  Procedure(s): THORACOTOMY MAJOR (Left) LV EPICARDIAL PACING LEAD PLACEMENT (N/A) as a surgical intervention .  The patient's history has been reviewed, patient examined, no change in status, stable for surgery.  I have reviewed the patient's chart and labs.  Questions were answered to the patient's satisfaction.     Alleen Borne

## 2015-03-01 ENCOUNTER — Inpatient Hospital Stay (HOSPITAL_COMMUNITY): Payer: Medicare Other

## 2015-03-01 ENCOUNTER — Encounter (HOSPITAL_COMMUNITY): Payer: Self-pay | Admitting: *Deleted

## 2015-03-01 LAB — GLUCOSE, CAPILLARY
GLUCOSE-CAPILLARY: 168 mg/dL — AB (ref 65–99)
GLUCOSE-CAPILLARY: 103 mg/dL — AB (ref 65–99)
Glucose-Capillary: 138 mg/dL — ABNORMAL HIGH (ref 65–99)
Glucose-Capillary: 105 mg/dL — ABNORMAL HIGH (ref 65–99)
Glucose-Capillary: 132 mg/dL — ABNORMAL HIGH (ref 65–99)

## 2015-03-01 LAB — BASIC METABOLIC PANEL
Anion gap: 8 (ref 5–15)
BUN: 17 mg/dL (ref 6–20)
CO2: 26 mmol/L (ref 22–32)
Calcium: 9.1 mg/dL (ref 8.9–10.3)
Chloride: 99 mmol/L — ABNORMAL LOW (ref 101–111)
Creatinine, Ser: 1 mg/dL (ref 0.44–1.00)
GFR calc Af Amer: >60 mL/min (ref >60)
GLUCOSE: 166 mg/dL — AB (ref 65–99)
Potassium: 3.5 mmol/L (ref 3.5–5.1)
Sodium: 133 mmol/L — ABNORMAL LOW (ref 135–145)

## 2015-03-01 LAB — CBC
HCT: 33.4 % — ABNORMAL LOW (ref 36.0–46.0)
Hemoglobin: 11.1 g/dL — ABNORMAL LOW (ref 12.0–15.0)
MCH: 29.7 pg (ref 26.0–34.0)
MCHC: 33.2 g/dL (ref 30.0–36.0)
MCV: 89.3 fL (ref 78.0–100.0)
PLATELETS: 152 10*3/uL (ref 150–400)
RBC: 3.74 MIL/uL — AB (ref 3.87–5.11)
RDW: 14.3 % (ref 11.5–15.5)
WBC: 10.5 10*3/uL (ref 4.0–10.5)

## 2015-03-01 MED ORDER — ALPRAZOLAM 0.25 MG PO TABS
0.2500 mg | ORAL_TABLET | Freq: Two times a day (BID) | ORAL | Status: DC | PRN
  Filled 2015-03-01: qty 1

## 2015-03-01 MED ORDER — SODIUM CHLORIDE 0.9 % IV BOLUS (SEPSIS)
500.0000 mL | Freq: Once | INTRAVENOUS | Status: AC
  Administered 2015-03-01: 500 mL via INTRAVENOUS

## 2015-03-01 MED ORDER — POTASSIUM CHLORIDE 10 MEQ/50ML IV SOLN
10.0000 meq | INTRAVENOUS | Status: AC
  Administered 2015-03-01 (×3): 10 meq via INTRAVENOUS
  Filled 2015-03-01 (×2): qty 50

## 2015-03-01 MED ORDER — INSULIN ASPART 100 UNIT/ML ~~LOC~~ SOLN
0.0000 [IU] | Freq: Three times a day (TID) | SUBCUTANEOUS | Status: DC
  Administered 2015-03-01: 2 [IU] via SUBCUTANEOUS

## 2015-03-01 MED ORDER — CETYLPYRIDINIUM CHLORIDE 0.05 % MT LIQD
7.0000 mL | Freq: Two times a day (BID) | OROMUCOSAL | Status: DC
  Administered 2015-03-01 – 2015-03-04 (×6): 7 mL via OROMUCOSAL

## 2015-03-01 NOTE — Progress Notes (Addendum)
Wasted 56ml of Fentanyl PCA syringe in sink with Lynetta Mare, RN.  Domenica Fail, RN  Hermina Barters, RN

## 2015-03-01 NOTE — Progress Notes (Signed)
1 Day Post-Op Procedure(s) (LRB): THORACOTOMY MAJOR (Left) LV EPICARDIAL PACING LEAD PLACEMENT (N/A) Subjective: C/o incisional pain, denies nausea  Objective: Vital signs in last 24 hours: Temp:  [97 F (36.1 C)-98.5 F (36.9 C)] 97.8 F (36.6 C) (07/30 0700) Pulse Rate:  [69-71] 69 (07/30 0700) Cardiac Rhythm:  [-] Ventricular paced (07/30 0400) Resp:  [16-29] 22 (07/30 0700) BP: (94-145)/(44-80) 94/44 mmHg (07/30 0700) SpO2:  [90 %-99 %] 94 % (07/30 0700) Arterial Line BP: (136-181)/(67-101) 157/67 mmHg (07/29 1100)  Hemodynamic parameters for last 24 hours:    Intake/Output from previous day: 07/29 0701 - 07/30 0700 In: 1755 [P.O.:120; I.V.:1535; IV Piggyback:100] Out: 2050 [Urine:1580; Blood:50; Chest Tube:420] Intake/Output this shift: Total I/O In: 150 [I.V.:50; IV Piggyback:100] Out: -   General appearance: alert and no distress Neurologic: intact Heart: regular rate and rhythm Lungs: clear to auscultation bilaterally Abdomen: normal findings: soft, non-tender no air leak, serosanguinous drainage from CT  Lab Results:  Recent Labs  02/26/15 1414 03/01/15 0445  WBC 5.5 10.5  HGB 11.1* 11.1*  HCT 32.6* 33.4*  PLT 160 152   BMET:  Recent Labs  02/26/15 1414 03/01/15 0445  NA 136 133*  K 3.9 3.5  CL 103 99*  CO2 25 26  GLUCOSE 105* 166*  BUN 23* 17  CREATININE 0.97 1.00  CALCIUM 9.1 9.1    PT/INR:  Recent Labs  02/26/15 1414  LABPROT 13.9  INR 1.05   ABG    Component Value Date/Time   PHART 7.466* 02/26/2015 1414   HCO3 22.6 02/26/2015 1414   TCO2 23.6 02/26/2015 1414   ACIDBASEDEF 0.7 02/26/2015 1414   O2SAT 95.5 02/26/2015 1414   CBG (last 3)   Recent Labs  02/28/15 1946 02/28/15 2338 03/01/15 0415  GLUCAP 168* 112* 168*    Assessment/Plan: S/P Procedure(s) (LRB): THORACOTOMY MAJOR (Left) LV EPICARDIAL PACING LEAD PLACEMENT (N/A) POD # 1  Doing well  Had ~ 500 ml from CT since OR- will leave in today, but should be  able to come out tomorrow To start enoxaparin today, will restart Xarelto tomorrow as well Advance diet Dc a line and Foley Transfer to 2W   LOS: 1 day    Loreli Slot 03/01/2015

## 2015-03-01 NOTE — Progress Notes (Signed)
Pt transferred from [facility] [place] with Chest tube intact.  I agree with the assessment from the previous nurses.  Pt was in some pain from moving here from the other unit.  Medication and rearranged in the bed.

## 2015-03-02 ENCOUNTER — Inpatient Hospital Stay (HOSPITAL_COMMUNITY): Payer: Medicare Other

## 2015-03-02 LAB — BASIC METABOLIC PANEL
Anion gap: 7 (ref 5–15)
BUN: 21 mg/dL — ABNORMAL HIGH (ref 6–20)
CHLORIDE: 100 mmol/L — AB (ref 101–111)
CO2: 26 mmol/L (ref 22–32)
CREATININE: 1 mg/dL (ref 0.44–1.00)
Calcium: 8.6 mg/dL — ABNORMAL LOW (ref 8.9–10.3)
GFR calc Af Amer: >60 mL/min (ref >60)
GFR calc non Af Amer: >60 mL/min (ref >60)
GLUCOSE: 131 mg/dL — AB (ref 65–99)
POTASSIUM: 3.7 mmol/L (ref 3.5–5.1)
Sodium: 133 mmol/L — ABNORMAL LOW (ref 135–145)

## 2015-03-02 LAB — GLUCOSE, CAPILLARY: Glucose-Capillary: 122 mg/dL — ABNORMAL HIGH (ref 65–99)

## 2015-03-02 LAB — MAGNESIUM: MAGNESIUM: 1.7 mg/dL (ref 1.7–2.4)

## 2015-03-02 MED ORDER — GUAIFENESIN ER 600 MG PO TB12
600.0000 mg | ORAL_TABLET | Freq: Two times a day (BID) | ORAL | Status: DC
  Administered 2015-03-02 – 2015-03-05 (×7): 600 mg via ORAL
  Filled 2015-03-02 (×8): qty 1

## 2015-03-02 NOTE — Progress Notes (Signed)
Pt ambulated in the halls 200 ft using a front wheel walker.  She tolerated the ambulation fair.  The patient wanted to go back to the bed instead of sitting up in the chair for dinner.

## 2015-03-02 NOTE — Progress Notes (Signed)
Pt. had 6 beat run of Vtach and was asymptomatic. It occurred while I was present in patients room. Hiroto Saltzman 5:29 AM

## 2015-03-02 NOTE — Discharge Summary (Signed)
Physician Discharge Summary       [address] [place].[address]       [place] [address]             [phone]    Patient ID: [name] [name] MRN: [id number] DOB/AGE: 60/16/1957 60 y.o.  Admit date: 02/28/2015 Discharge date: 03/05/2015  Admission Diagnoses: 1. History of non ischemic cardiomyopathy 2. History of CHF 3. History of COPD 4. History of a fib 5. History of anxiety and depression 6. History of cardiac arrest 7. History of thyroid disease  Discharge Diagnoses:  1. History of non ischemic cardiomyopathy 2. History of CHF 3. History of COPD 4. History of a fib 5. History of anxiety and depression 6. History of cardiac arrest 7. History of thyroid disease  Procedure (s):  1. Left anterolateral thoracotomy 2. Insertion of 2 left ventricular epicardial pacing leads 3. Revision of Biventricular pacing system by Dr. Laneta Simmers on 02/28/2015.  History of Presenting Illness: The patient is a 59 year old woman with a history of non-ischemic cardiomyopathy with an EF of 30-35% by echo in 10/2013 with restrictive diastolic dysfunction and chronic atrial fibrillation who underwent insertion of a BiV pacer via the left subclavian vein on 06/21/2012 followed by AV ablation the following day. She had an LV lead placed via the coronary sinus with some difficulty due to the anatomy and there was a very low threshold for diaphragmatic pacing so the lead was turned off. She has been followed by Dr. Ladona Ridgel and he felt that it is time to place an LV epicardial lead for BiV pacing. I saw her in March and she wanted to wait until she had medicare coverage which she now has.  Brief Hospital Course:  She has been afebrile and hemodynamically stable.  A line and foley were removed early in post op course. Chest tube had a fair amount of output, but this did gradually decrease. Chest tube did not have an air leak. It was placed to water seal. Daily chest x rays were obtained and remained stable.  Chest tube was removed on 03/03/2015. She remained pace. She was felt surgically stable for transfer from the ICU to 2000 for further convalescence on 03/01/2015. She required a couple liters of oxygen via [place]. She was weaned to room air. She is tolerating a diet and has had a bowel movement. She is felt surgically stable for discharge today.   Latest Vital Signs: Blood pressure 105/65, pulse 65, temperature 98.5 F (36.9 C), temperature source Oral, resp. rate 18, height 5\' 4"  (1.626 m), weight 144 lb 6.4 oz (65.499 kg), SpO2 98 %.  Physical Exam: Cardiovascular: Paced Pulmonary: Clear to auscultation on right and slightly diminished left base; no rales, wheezes, or rhonchi. Abdomen: Soft, non tender, bowel sounds present. Wound: Clean and dry. No erythema or signs of infection.  Discharge Condition:Stable and discharged to home.  Recent laboratory studies:  Lab Results  Component Value Date   WBC 10.5 03/01/2015   HGB 11.1* 03/01/2015   HCT 33.4* 03/01/2015   MCV 89.3 03/01/2015   PLT 152 03/01/2015   Lab Results  Component Value Date   NA 133* 03/02/2015   K 3.7 03/02/2015   CL 100* 03/02/2015   CO2 26 03/02/2015   CREATININE 1.00 03/02/2015   GLUCOSE 131* 03/02/2015      Diagnostic Studies: Dg Chest 2 View  03/02/2015   CLINICAL DATA:  Post thoracotomy  EXAM: CHEST  2 VIEW  COMPARISON:  03/01/2015; 02/26/2015;  06/22/2012  FINDINGS: Grossly unchanged cardiac silhouette and mediastinal contours. Stable positioning of support apparatus. No definite pneumothorax. Mild pulmonary venous congestion without frank evidence of edema. Grossly unchanged bibasilar heterogeneous opacities, left greater than right. Unchanged trace bilateral effusions. Unchanged bones.  IMPRESSION: 1.  Stable positioning of support apparatus.  No pneumothorax. 2. Similar findings of mild pulmonary venous congestion and trace bilateral effusions.   Electronically Signed   By: Simonne Come M.D.   On: 03/02/2015  09:23   Discharge Medications:   Medication List    TAKE these medications        albuterol 108 (90 BASE) MCG/ACT inhaler  Commonly known as:  PROVENTIL HFA;VENTOLIN HFA  Inhale 2 puffs into the lungs every 6 (six) hours as needed for wheezing or shortness of breath.     ALPRAZolam 0.25 MG tablet  Commonly known as:  XANAX  Take 0.25 mg by mouth 2 (two) times daily as needed for anxiety.     calcium carbonate 500 MG chewable tablet  Commonly known as:  TUMS - dosed in mg elemental calcium  Chew 2 tablets by mouth 3 (three) times daily.     carvedilol 12.5 MG tablet  Commonly known as:  COREG  Take 1 tablet (12.5 mg total) by mouth 2 (two) times daily.     furosemide 40 MG tablet  Commonly known as:  LASIX  Take 20 mg by mouth daily.     lisinopril 5 MG tablet  Commonly known as:  PRINIVIL,ZESTRIL  Take 1 tablet (5 mg total) by mouth daily.     magnesium oxide 400 MG tablet  Commonly known as:  MAG-OX  TAKE 2 TIMES DAILY UNTIL 01/06/15 THEN TAKE 1 TAB DAILY     oxyCODONE 5 MG immediate release tablet  Commonly known as:  Oxy IR/ROXICODONE  Take 1-2 tablets (5-10 mg total) by mouth every 4 (four) hours as needed for severe pain.     pantoprazole 40 MG tablet  Commonly known as:  PROTONIX  Take 40 mg by mouth daily.     rivaroxaban 20 MG Tabs tablet  Commonly known as:  XARELTO  Take 1 tablet (20 mg total) by mouth daily with supper.     sertraline 50 MG tablet  Commonly known as:  ZOLOFT  Take 50 mg by mouth daily.     SPIRIVA HANDIHALER 18 MCG inhalation capsule  Generic drug:  tiotropium  Place 18 mcg into inhaler and inhale daily.     zolpidem 5 MG tablet  Commonly known as:  AMBIEN  Take 5 mg by mouth at bedtime. For sleep        Follow Up Appointments: Follow-up Information    Follow up with Alleen Borne, MD On 03/26/2015.   Specialty:  Cardiothoracic Surgery   Why:  PA/LAT CXR to be taken at [facility] (which is in the same building as Dr.  Sharee Pimple office)on 03/26/2015 at 11:45 am;Appointment time is at 12:30 pm   Contact information:   [address] [place] [address] [phone]       Follow up with [facility].   Contact information:   Please contact [facility] to find PCP in your area  [phone]      Follow up with Nurse On 03/11/2015.   Why:  Appointment is with nurse to have chest tube suture removed. Office will call with appointment time.   Contact information:   [address] [place]  [address] [phone]      Signed: Doree Fudge MPA-C 03/05/2015, 2:59 PM

## 2015-03-02 NOTE — Progress Notes (Signed)
Asked to evaluate a patient known to me for NSVT. She has known history of chronic systolic HF with LVEF 30-35%, Of note 2 doses of coreg held yesterday due to soft bp's. Resumed today. K 3.5 yesterday, will repeat labs with Mg as well today. Would keep K at 4 and Mg at 2. BPs look better this AM, continue current coreg. If needed would decrease dose but would try to avoid holding dose all together. Continue telemetry  Dominga Ferry MD

## 2015-03-02 NOTE — Discharge Instructions (Signed)
Thoracotomy, Care After ° These instructions give you information on caring for yourself after your procedure. Your doctor may also give you more specific instructions. Call your doctor if you have any problems or questions after your procedure. °HOME CARE °· Only take medicine as told by your doctor. Take pain medicine before your pain gets very bad. °· Take deep breaths to protect yourself from a lung infection (pneumonia). °· Cough often to clear thick spit (mucus) and to open your lungs. Hold a pillow against your chest if it hurts to cough. °· Use your breathing machine (incentive spirometer) as told. °· Change bandages (dressings) as told by your doctor. °· Take off the bandage over your chest tube area as told by your doctor. °· Continue your normal diet as told by your doctor. °· Eat high-fiber foods. This includes whole grain cereals, Giannetti rice, beans, and fresh fruits and vegetables. °· Drink enough fluids to keep your pee (urine) clear or pale yellow. Avoid caffeine. °· Talk to your doctor about taking a medicine to soften your poop (stool softener, laxative). °· Keep doing activities as told by your doctor. Balance your activity with rest. °· Avoid lifting until your doctor says it is okay. °· Do not drive until told by your doctor. Do not drive while taking pain medicines (narcotics). °· Do not bathe, swim, or use a hot tub until told by your doctor. You may shower. Gently wash the area of your cut (incision) with soap and water as told. °· Do not use any tobacco products including cigarettes, chewing tobacco, and electronic cigarettes. Avoid being around others who smoke. °· Schedule a doctor visit to get your stitches or staples removed as told. °· Schedule and go to all doctor visits as told. °· Go to therapy that can help improve your lungs (pulmonary rehabilitation) as told by your doctor. °· Do not travel by airplane for 2 weeks after the chest tube is taken out. °GET HELP IF: °· You are bleeding  from your wounds. °· You have redness, puffiness (swelling), or more pain in the wounds. °· You feel your heart beating fast or skipping beats (irregular heartbeat). °· You have yellowish-white fluid (pus) coming from your wounds. °· You have a bad smell coming from the wound or bandage. °· You have a fever or chills. °· You feel sick to your stomach or you throw up (vomit). °· You have muscle aches. °GET HELP RIGHT AWAY IF: °· You have a rash. °· You have trouble breathing. °· Your medicines are causing you problems. °· You keep feeling sick to your stomach (nauseous). °· You feel light-headed. °· You have shortness of breath or chest pain. °· You have pain that will not go away. °MAKE SURE YOU: °· Understand these instructions. °· Will watch your condition. °· Will get help right away if you are not doing well or get worse. °Document Released: 01/18/2012 Document Revised: 12/03/2013 Document Reviewed: 03/07/2013 °ExitCare® Patient Information ©2015 [employer]. This information is not intended to replace advice given to you by your health care provider. Make sure you discuss any questions you have with your health care provider. ° °

## 2015-03-02 NOTE — Progress Notes (Addendum)
      [address]       [place] [address]             [phone]        2 Days Post-Op Procedure(s) (LRB): THORACOTOMY MAJOR (Left) LV EPICARDIAL PACING LEAD PLACEMENT (N/A)  Subjective: Patient has some pain at chest tube site.  Objective: Vital signs in last 24 hours: Temp:  [97 F (36.1 C)-98.5 F (36.9 C)] 98 F (36.7 C) (07/31 0425) Pulse Rate:  [69-71] 70 (07/31 0425) Cardiac Rhythm:  [-] Ventricular paced (07/30 2000) Resp:  [15-35] 18 (07/31 0425) BP: (76-110)/(40-56) 104/52 mmHg (07/31 0425) SpO2:  [90 %-100 %] 95 % (07/31 0425)   Current Weight  02/28/15 143 lb (64.864 kg)      Intake/Output from previous day: 07/30 0701 - 07/31 0700 In: 980 [P.O.:480; I.V.:350; IV Piggyback:150] Out: 1050 [Urine:435; Chest Tube:615]   Physical Exam:  Cardiovascular: Paced Pulmonary: Clear to auscultation on right and slightly diminished left base; no rales, wheezes, or rhonchi. Abdomen: Soft, non tender, bowel sounds present. Wounds: Clean and dry.  No erythema or signs of infection. Chest tube: to water seal  Lab Results: CBC: Recent Labs  03/01/15 0445  WBC 10.5  HGB 11.1*  HCT 33.4*  PLT 152   BMET:  Recent Labs  03/01/15 0445  NA 133*  K 3.5  CL 99*  CO2 26  GLUCOSE 166*  BUN 17  CREATININE 1.00  CALCIUM 9.1    PT/INR:  Lab Results  Component Value Date   INR 1.05 02/26/2015   INR 1.48 06/27/2012   INR 1.52* 06/26/2012   ABG:  INR: Will add last result for INR, ABG once components are confirmed Will add last 4 CBG results once components are confirmed  Assessment/Plan:  1. CV - Paced at 70. On Coreg 25 mg bid, Lisinopril 10 mg daily. BP labile so will stop Lisinopril for now. She was on Xarelto pre op. Likely restart later today or in am. 2.  Pulmonary - Chest tube with 615 cc last 24 hours. Chest tube to remain for now. CXR appears to show trace left apical pneumothorax and left base atelectasis. Encourage  incentive spirometer. 3.  Acute blood loss anemia - H and H yesterday 11.1 and 33.4 4. Mucinex for cough. She was on Lisinopril (but taking pre op) and I stopped this secondary to labile BP. 5. Stop IV fluids as tolerating diet 6. CBGs 105/132/122.Stop accu checks. No history of diabetes 7. Remove central line  ZIMMERMAN,DONIELLE MPA-C 03/02/2015,8:02 AM  Patient seen and examined, agree with above Keep CT until drainage decreases  Viviann Spare C. Dorris Fetch, MD [facility] [phone]

## 2015-03-03 ENCOUNTER — Encounter (HOSPITAL_COMMUNITY): Payer: Self-pay | Admitting: Surgery

## 2015-03-03 ENCOUNTER — Inpatient Hospital Stay (HOSPITAL_COMMUNITY): Payer: Medicare Other

## 2015-03-03 MED ORDER — CARVEDILOL 12.5 MG PO TABS
12.5000 mg | ORAL_TABLET | Freq: Two times a day (BID) | ORAL | Status: DC
  Administered 2015-03-03 – 2015-03-05 (×5): 12.5 mg via ORAL
  Filled 2015-03-03 (×8): qty 1

## 2015-03-03 MED ORDER — POTASSIUM CHLORIDE CRYS ER 20 MEQ PO TBCR
30.0000 meq | EXTENDED_RELEASE_TABLET | Freq: Once | ORAL | Status: AC
  Administered 2015-03-03: 30 meq via ORAL
  Filled 2015-03-03: qty 1

## 2015-03-03 NOTE — Care Management Important Message (Signed)
Important Message  Patient Details  Name: [name] [name] MRN: [id number] Date of Birth: 1955/10/07   Medicare Important Message Given:  [facility] notification given    Kyla Balzarine 03/03/2015, 12:10 PMImportant Message  Patient Details  Name: [name] [name] MRN: [id number] Date of Birth: 1956/02/20   Medicare Important Message Given:  Yes-second notification given    Kyla Balzarine 03/03/2015, 12:10 PM

## 2015-03-03 NOTE — Progress Notes (Addendum)
      [address]       [place] [address]             [phone]        3 Days Post-Op Procedure(s) (LRB): THORACOTOMY MAJOR (Left) LV EPICARDIAL PACING LEAD PLACEMENT (N/A)  Subjective: Patient states less cough and Mucinex is helping.  Objective: Vital signs in last 24 hours: Temp:  [97.8 F (36.6 C)-98.4 F (36.9 C)] 98.4 F (36.9 C) (08/01 0521) Pulse Rate:  [69-71] 71 (08/01 0521) Cardiac Rhythm:  [-] Ventricular paced (07/31 1952) Resp:  [17-18] 18 (08/01 0521) BP: (99-119)/(55-57) 119/57 mmHg (08/01 0521) SpO2:  [92 %-95 %] 95 % (08/01 0521)   Current Weight  02/28/15 143 lb (64.864 kg)      Intake/Output from previous day: 07/31 0701 - 08/01 0700 In: 800 [P.O.:800] Out: 2275 [Urine:2000; Chest Tube:275]   Physical Exam:  Cardiovascular: Paced Pulmonary: Clear to auscultation and coarse on the left Abdomen: Soft, non tender, bowel sounds present. Wounds: Clean and dry.  No erythema or signs of infection. Chest tube: to water seal, no air leak  Lab Results: CBC:  Recent Labs  03/01/15 0445  WBC 10.5  HGB 11.1*  HCT 33.4*  PLT 152   BMET:   Recent Labs  03/01/15 0445 03/02/15 1535  NA 133* 133*  K 3.5 3.7  CL 99* 100*  CO2 26 26  GLUCOSE 166* 131*  BUN 17 21*  CREATININE 1.00 1.00  CALCIUM 9.1 8.6*    PT/INR:  Lab Results  Component Value Date   INR 1.05 02/26/2015   INR 1.48 06/27/2012   INR 1.52* 06/26/2012   ABG:  INR: Will add last result for INR, ABG once components are confirmed Will add last 4 CBG results once components are confirmed  Assessment/Plan:  1. CV - Had NSVT yesterday and seen by cardiology. Continues to be paced at 70. On Coreg 25 mg bid. BP still somewhat labile. Will decrease Coreg but per cardiology, she not to be stopped.She was on Xarelto pre op. Likely restart lonce CT removed. 2.  Pulmonary - Chest tube with 275 cc last 24 hours, which is decreased from previous 2 days. CXR  this am shows no pneumothorax, LLL atelectasis and small pleural effusion, and stable cardiomegaly. Chest tube to remain for now. Encourage incentive spirometer. 3.  Acute blood loss anemia - H and H yesterday 11.1 and 33.4 4. Mucinex for cough. She was on Lisinopril (but taking pre op) and I stopped this secondary to labile BP.   ZIMMERMAN,DONIELLE MPA-C 03/03/2015,7:40 AM    Chart reviewed, patient examined, agree with above. Chest tube output has decreased. Will remove tube today and get 2V CXR in am. Wean oxygen and mobilize.

## 2015-03-04 ENCOUNTER — Inpatient Hospital Stay (HOSPITAL_COMMUNITY): Payer: Medicare Other

## 2015-03-04 MED ORDER — LISINOPRIL 5 MG PO TABS
5.0000 mg | ORAL_TABLET | Freq: Every day | ORAL | Status: DC
  Administered 2015-03-04 – 2015-03-05 (×2): 5 mg via ORAL
  Filled 2015-03-04 (×3): qty 1

## 2015-03-04 NOTE — Care Management Note (Signed)
Case Management Note  Patient Details  Name: [name] [name] MRN: [id number] Date of Birth: [birth year]  Subjective/Objective:   Pt admitted with Cardiomyopathy                 Action/Plan:  Pt is independent from home with son in [place] temporarily to gain strength, prior to this pt was living in [place].  Pt will discharge to [place] with sisters until Saturday, she will then return to [place] with son.    Expected Discharge Date:                  Expected Discharge Plan:  Home with Self Care  In-House Referral:     Discharge planning Services  [facility]  Post Acute Care Choice:    Choice offered to:     DME Arranged:    DME Agency:     HH Arranged:    HH Agency:     Status of Service:  In process, will continue to follow  Medicare Important Message Given:  Yes-second notification given Date Medicare IM Given:    Medicare IM give by:    Date Additional Medicare IM Given:    Additional Medicare Important Message give by:     If discussed at Long Length of Stay Meetings, dates discussed:    Additional Comments: CM was contacted by pt son Milinda Pointer, Aneta Mins requested CM to talk with pt about obtaining a PCP in [place], pt is expected to stay with him at least 3/4 of a year.  CM assessed pt an explained the importance of having a near by PCP while she was out of her normal PCP area.  Pt agreed and asked CM to provide PCP information to son in [place] area.  CM provided son with [facility] Number and informed him; service would help him find a PCP in his area that accepts moms insurance.   Cherylann Parr, RN 03/04/2015, 4:24 PM

## 2015-03-04 NOTE — Progress Notes (Addendum)
      [address]       [place] [address]             [phone]        4 Days Post-Op Procedure(s) (LRB): THORACOTOMY MAJOR (Left) LV EPICARDIAL PACING LEAD PLACEMENT (N/A)  Subjective: Patient states she feels better now that chest tube has been removed. She is eating breakfast.  Objective: Vital signs in last 24 hours: Temp:  [97.4 F (36.3 C)-98.3 F (36.8 C)] 98 F (36.7 C) (08/02 0413) Pulse Rate:  [69-71] 71 (08/02 0413) Cardiac Rhythm:  [-] Ventricular paced (08/01 2030) Resp:  [18-20] 20 (08/02 0413) BP: (120-144)/(57-66) 144/66 mmHg (08/02 0413) SpO2:  [92 %-95 %] 93 % (08/02 0413) Weight:  [144 lb 12.8 oz (65.681 kg)] 144 lb 12.8 oz (65.681 kg) (08/02 0413)   Current Weight  03/04/15 144 lb 12.8 oz (65.681 kg)      Intake/Output from previous day: 08/01 0701 - 08/02 0700 In: 240 [P.O.:240] Out: 300 [Urine:300]   Physical Exam:  Cardiovascular: Paced Pulmonary: Clear to auscultation and coarse on the left Abdomen: Soft, non tender, bowel sounds present. Wounds: Clean and dry.  No erythema or signs of infection.   Lab Results: CBC: No results for input(s): WBC, HGB, HCT, PLT in the last 72 hours. BMET:   Recent Labs  03/02/15 1535  NA 133*  K 3.7  CL 100*  CO2 26  GLUCOSE 131*  BUN 21*  CREATININE 1.00  CALCIUM 8.6*    PT/INR:  Lab Results  Component Value Date   INR 1.05 02/26/2015   INR 1.48 06/27/2012   INR 1.52* 06/26/2012   ABG:  INR: Will add last result for INR, ABG once components are confirmed Will add last 4 CBG results once components are confirmed  Assessment/Plan:  1. CV - Had NSVT 03/02/2015 and seen by cardiology yesterday. Continues to be paced at 70. On Coreg 12.5 mg bid. BP no longer labile so will restart low dose Lisinopril. She was on Xarelto pre op. Will discuss with Dr. Laneta Simmers when to restart. 2.  Pulmonary - On 2 liters of oxygen via [place] as tolerates. Chest tube removed yesterday.  CXR this am appears to show no pneumothorax, LLL atelectasis and small pleural effusion, and stable cardiomegaly. Encourage incentive spirometer. 3.  Acute blood loss anemia - Last H and H  11.1 and 33.4 4. Possible discharge in am   ZIMMERMAN,DONIELLE MPA-C 03/04/2015,7:38 AM    Chart reviewed, patient examined, agree with above. She feels better today and is coughing up some sputum. Breathing is better and off oxygen. Plan home tomorrow with her sisters if no changes. I discussed status with her sisters and answered their questions. Will remove chest tube suture in the office in 1 week and follow up with me in 2-3 weeks with a CXR.

## 2015-03-05 MED ORDER — OXYCODONE HCL 5 MG PO TABS: 5.0000 mg | ORAL_TABLET | ORAL | Status: DC | PRN

## 2015-03-05 MED ORDER — RIVAROXABAN 20 MG PO TABS: 20.0000 mg | ORAL_TABLET | Freq: Every day | ORAL | Status: DC

## 2015-03-05 MED ORDER — LISINOPRIL 5 MG PO TABS: 5.0000 mg | ORAL_TABLET | Freq: Every day | ORAL | Status: DC

## 2015-03-05 MED ORDER — CARVEDILOL 12.5 MG PO TABS: 12.5000 mg | ORAL_TABLET | Freq: Two times a day (BID) | ORAL | Status: DC

## 2015-03-05 NOTE — Progress Notes (Signed)
Patient had a short run of V-tach . Patient in bed sleeping

## 2015-03-05 NOTE — Progress Notes (Addendum)
      [address]       [place] [address]             [phone]        5 Days Post-Op Procedure(s) (LRB): THORACOTOMY MAJOR (Left) LV EPICARDIAL PACING LEAD PLACEMENT (N/A)  Subjective: Patient was sleeping but awakened. She did not sleep well but has no other complaints.  Objective: Vital signs in last 24 hours: Temp:  [97.7 F (36.5 C)-98.7 F (37.1 C)] 97.7 F (36.5 C) (08/03 0403) Pulse Rate:  [69-75] 69 (08/03 0403) Cardiac Rhythm:  [-] Ventricular paced (08/02 2000) Resp:  [18-21] 19 (08/03 0403) BP: (106-145)/(67-88) 106/88 mmHg (08/03 0403) SpO2:  [92 %-98 %] 96 % (08/03 0403) Weight:  [144 lb 6.4 oz (65.499 kg)] 144 lb 6.4 oz (65.499 kg) (08/03 0403)   Current Weight  03/05/15 144 lb 6.4 oz (65.499 kg)      Intake/Output from previous day: 08/02 0701 - 08/03 0700 In: 840 [P.O.:840] Out: 351 [Urine:350; Stool:1]   Physical Exam:  Cardiovascular: Paced Pulmonary: Clear to auscultation and coarse on the left Abdomen: Soft, non tender, bowel sounds present. Wounds: Clean and dry.  No erythema or signs of infection.   Lab Results: CBC: No results for input(s): WBC, HGB, HCT, PLT in the last 72 hours. BMET:   Recent Labs  03/02/15 1535  NA 133*  K 3.7  CL 100*  CO2 26  GLUCOSE 131*  BUN 21*  CREATININE 1.00  CALCIUM 8.6*    PT/INR:  Lab Results  Component Value Date   INR 1.05 02/26/2015   INR 1.48 06/27/2012   INR 1.52* 06/26/2012   ABG:  INR: Will add last result for INR, ABG once components are confirmed Will add last 4 CBG results once components are confirmed  Assessment/Plan:  1. CV - Had 5 beats of VT earlier this am. Continues to be paced at 70. On Coreg 12.5 mg bid and Lisinopril 5 mg daily. She was on Xarelto pre op. Will restart at discharge. 2.  Pulmonary - On room air. Encourage incentive spirometer. 3.  Acute blood loss anemia - Last H and H  11.1 and 33.4 4. Will discuss disposition with  surgeon   Shon Mansouri MPA-C 03/05/2015,7:34 AM

## 2015-03-09 DIAGNOSIS — M25571 Pain in right ankle and joints of right foot: Secondary | ICD-10-CM | POA: Diagnosis not present

## 2015-03-09 DIAGNOSIS — M25561 Pain in right knee: Secondary | ICD-10-CM | POA: Diagnosis not present

## 2015-03-09 DIAGNOSIS — J449 Chronic obstructive pulmonary disease, unspecified: Secondary | ICD-10-CM | POA: Diagnosis not present

## 2015-03-09 DIAGNOSIS — M79671 Pain in right foot: Secondary | ICD-10-CM | POA: Diagnosis not present

## 2015-03-11 DIAGNOSIS — I429 Cardiomyopathy, unspecified: Secondary | ICD-10-CM

## 2015-03-24 ENCOUNTER — Encounter: Payer: Medicare Other | Admitting: Cardiology

## 2015-03-24 ENCOUNTER — Ambulatory Visit: Payer: Medicare Other | Admitting: Cardiology

## 2015-03-24 NOTE — Progress Notes (Signed)
Patient ID: [name] [name], female   DOB: 01-Jul-1956, 59 y.o.   MRN: [id number]   ERROR

## 2015-03-26 ENCOUNTER — Ambulatory Visit: Payer: Medicare Other | Admitting: Physician Assistant

## 2015-03-26 ENCOUNTER — Ambulatory Visit: Payer: Self-pay | Admitting: Surgery

## 2015-04-01 ENCOUNTER — Other Ambulatory Visit: Payer: Self-pay | Admitting: Surgery

## 2015-04-01 DIAGNOSIS — I429 Cardiomyopathy, unspecified: Secondary | ICD-10-CM

## 2015-04-02 ENCOUNTER — Encounter: Payer: Self-pay | Admitting: Surgery

## 2015-04-02 ENCOUNTER — Ambulatory Visit
Admission: RE | Admit: 2015-04-02 | Discharge: 2015-04-02 | Disposition: A | Discharge status: Home / Self Care | Payer: Medicare Other | Source: Ambulatory Visit | Attending: Surgery | Admitting: Surgery

## 2015-04-02 ENCOUNTER — Ambulatory Visit: Payer: Medicare Other | Admitting: Cardiology

## 2015-04-02 ENCOUNTER — Ambulatory Visit (INDEPENDENT_AMBULATORY_CARE_PROVIDER_SITE_OTHER): Payer: Self-pay | Admitting: Surgery

## 2015-04-02 VITALS — BP 110/67 | HR 77 | Resp 18 | Ht 64.0 in | Wt 137.0 lb

## 2015-04-02 DIAGNOSIS — I5030 Unspecified diastolic (congestive) heart failure: Secondary | ICD-10-CM

## 2015-04-02 DIAGNOSIS — I429 Cardiomyopathy, unspecified: Secondary | ICD-10-CM

## 2015-04-02 NOTE — Progress Notes (Signed)
HPI: Patient returns for routine postoperative follow-up having undergone left thoracotomy for insertion of LV epicardial pacing leads on 02/28/2015. The patient's early postoperative recovery while in the hospital was notable for an uncomplicated postop course. Since hospital discharge the patient reports that she has been feeling fairly well. Her only complaint has been of pain and swelling in her feet that felt like gout. This has resolved without treatment. She has been walking short distances without shortness of breath but has been limited due to her feet.   Current Outpatient Prescriptions  Medication Sig Dispense Refill  . albuterol (PROVENTIL HFA;VENTOLIN HFA) 108 (90 BASE) MCG/ACT inhaler Inhale 2 puffs into the lungs every 6 (six) hours as needed for wheezing or shortness of breath. 1 Inhaler 2  . ALPRAZolam (XANAX) 0.25 MG tablet Take 0.25 mg by mouth 2 (two) times daily as needed for anxiety.    . calcium carbonate (TUMS - DOSED IN MG ELEMENTAL CALCIUM) 500 MG chewable tablet Chew 2 tablets by mouth 3 (three) times daily.    . carvedilol (COREG) 12.5 MG tablet Take 1 tablet (12.5 mg total) by mouth 2 (two) times daily. 60 tablet 1  . furosemide (LASIX) 40 MG tablet Take 20 mg by mouth daily.    Marland Kitchen lisinopril (PRINIVIL,ZESTRIL) 5 MG tablet Take 1 tablet (5 mg total) by mouth daily. 30 tablet 1  . magnesium oxide (MAG-OX) 400 MG tablet TAKE 2 TIMES DAILY UNTIL 01/06/15 THEN TAKE 1 TAB DAILY 38 tablet 3  . oxyCODONE (OXY IR/ROXICODONE) 5 MG immediate release tablet Take 1-2 tablets (5-10 mg total) by mouth every 4 (four) hours as needed for severe pain. 30 tablet 0  . pantoprazole (PROTONIX) 40 MG tablet Take 40 mg by mouth daily.    . rivaroxaban (XARELTO) 20 MG TABS tablet Take 1 tablet (20 mg total) by mouth daily with supper. 30 tablet 1  . sertraline (ZOLOFT) 50 MG tablet Take 50 mg by mouth daily.    Marland Kitchen SPIRIVA HANDIHALER 18 MCG inhalation capsule Place 18 mcg into inhaler and  inhale daily.     Marland Kitchen zolpidem (AMBIEN) 5 MG tablet Take 5 mg by mouth at bedtime. For sleep     No current facility-administered medications for this visit.    Physical Exam: BP 110/67 mmHg  Pulse 77  Resp 18  Ht  (1.626 m)  Wt 137 lb (62.143 kg)  BMI 23.50 kg/m2  SpO2 98% She looks well Cardiac exam shows a regular rate and rhythm with normal heart sounds Lung exam is clear The left thoracotomy incision is healing well.  The pacemaker incision is healing well There is no peripheral edema. She has a large bunion on the left MTP joint.   Diagnostic Tests:  CLINICAL DATA: Cardiomyopathy, pacemaker replacement February 28, 2015, COPD, no current chest complaints, follow-up study  EXAM: CHEST 2 VIEW  COMPARISON: PA and lateral chest x-ray of March 04, 2015  FINDINGS: The lungs are mildly hyperinflated. There is no focal infiltrate. The cardiac silhouette is enlarged. The pulmonary vascularity is normal. The mediastinum is normal in width. The permanent pacemaker is in reasonable position. There is no pleural effusion. The bony thorax exhibits no acute abnormality.  IMPRESSION: There is no active cardiopulmonary disease. There is stable enlargement of the cardiac silhouette.   Electronically Signed  By: David Swaziland M.D.  On: 04/02/2015 14:59  Impression:  Overall she is recovering well following her surgery. I encouraged her to continue walking as  much as possible. I asked her not to lift anything heavier than 10 lbs for 8 weeks postop.  Plan:  She will continue follow up with Dr. Wyline Mood and Dr. Juanetta Gosling.    Alleen Borne, MD [facility] [phone]

## 2015-04-16 DIAGNOSIS — M109 Gout, unspecified: Secondary | ICD-10-CM | POA: Diagnosis not present

## 2015-04-18 ENCOUNTER — Encounter: Payer: Self-pay | Admitting: Cardiology

## 2015-04-18 ENCOUNTER — Ambulatory Visit (INDEPENDENT_AMBULATORY_CARE_PROVIDER_SITE_OTHER): Payer: Medicare Other | Admitting: Cardiology

## 2015-04-18 VITALS — BP 150/88 | HR 74 | Ht 64.0 in | Wt 136.8 lb

## 2015-04-18 DIAGNOSIS — I4891 Unspecified atrial fibrillation: Secondary | ICD-10-CM

## 2015-04-18 DIAGNOSIS — I5022 Chronic systolic (congestive) heart failure: Secondary | ICD-10-CM | POA: Diagnosis not present

## 2015-04-18 DIAGNOSIS — Z950 Presence of cardiac pacemaker: Secondary | ICD-10-CM

## 2015-04-18 NOTE — Patient Instructions (Signed)
Your physician recommends that you schedule a follow-up appointment in: 3 MONTHS WITH DR. BRANCH  PLEASE SCHEDULE AN APPOINTMENT TO SEE DR. Ladona Ridgel IN DEVICE CLINIC [place]  Your physician has recommended you make the following change in your medication:   STOP CELEBREX   Thank you for choosing [facility]!!

## 2015-04-18 NOTE — Progress Notes (Signed)
Patient ID: [name] [name], female   DOB: August 09, 1955, 59 y.o.   MRN: [id number]     Clinical Summary [name] [name] is a 59 y.o.female seen today for follow up of the following medical problems.   1. NICM  - echo 10/2013 LVEF 30-35%, restrictive diastolic dysfunction - cath Jan 2011 showed no obstructive CAD  - denies any SOB or DOE. No recent swelling.  - weights at home stable at 136 lbs.   2. Chronic afib  - prior AV nodal ablation with permanent pacemaker  - no recent palpitations. No bleeding troubles on xarelto   3. Moderate MR - stable from last echo, no significant symptoms  4. Pacemaker - she has a Medtronic BiV pacemaker followed by Dr Ladona Ridgel, LV lead replaced epicardial by Dr Laneta Simmers 01/2015   5. COPD - management per Dr Juanetta Gosling.   6. HTN - complaint with meds Past Medical History  Diagnosis Date  . Anxiety and depression   . Atrial fibrillation     Echocardiogram in 2004-borderline LV function; no valvular abnormalities; 06/2012: AV nodal ablation + biventricular pacemaker  . Anemia     2011: Hemoglobin of 11.4; hematocrit of 33.5; normal MCV  . Cardiac arrest 06/2012    16 day hospitalization; caused by hyperkalemia; multiple complications including CHF; permanent atrial fibrillation requiring AV nodal ablation + pacing  . Thyroid disease   . Nonischemic cardiomyopathy     Echo 06/2012: EF 30-35%, moderate to severe MR; cardiac cath 06/2012:30-40% D1 and D2, estimated EF-35%, MR-not graded; right bundle Elexis Pollak block  . Dysrhythmia   . Presence of permanent cardiac pacemaker   . CHF (congestive heart failure)   . Shortness of breath dyspnea   . Depression   . Anxiety   . Urinary frequency   . GERD (gastroesophageal reflux disease)     protonix  . COPD (chronic obstructive pulmonary disease)     heterozygous alpha-1 anti-trypsin deficiency     No Known Allergies   Current Outpatient Prescriptions  Medication Sig Dispense Refill  . albuterol  (PROVENTIL HFA;VENTOLIN HFA) 108 (90 BASE) MCG/ACT inhaler Inhale 2 puffs into the lungs every 6 (six) hours as needed for wheezing or shortness of breath. 1 Inhaler 2  . ALPRAZolam (XANAX) 0.25 MG tablet Take 0.25 mg by mouth 2 (two) times daily as needed for anxiety.    . calcium carbonate (TUMS - DOSED IN MG ELEMENTAL CALCIUM) 500 MG chewable tablet Chew 2 tablets by mouth 3 (three) times daily.    . carvedilol (COREG) 12.5 MG tablet Take 1 tablet (12.5 mg total) by mouth 2 (two) times daily. 60 tablet 1  . furosemide (LASIX) 40 MG tablet Take 20 mg by mouth daily.    Marland Kitchen lisinopril (PRINIVIL,ZESTRIL) 5 MG tablet Take 1 tablet (5 mg total) by mouth daily. 30 tablet 1  . magnesium oxide (MAG-OX) 400 MG tablet TAKE 2 TIMES DAILY UNTIL 01/06/15 THEN TAKE 1 TAB DAILY 38 tablet 3  . oxyCODONE (OXY IR/ROXICODONE) 5 MG immediate release tablet Take 1-2 tablets (5-10 mg total) by mouth every 4 (four) hours as needed for severe pain. 30 tablet 0  . pantoprazole (PROTONIX) 40 MG tablet Take 40 mg by mouth daily.    . rivaroxaban (XARELTO) 20 MG TABS tablet Take 1 tablet (20 mg total) by mouth daily with supper. 30 tablet 1  . sertraline (ZOLOFT) 50 MG tablet Take 50 mg by mouth daily.    Marland Kitchen SPIRIVA HANDIHALER 18 MCG inhalation capsule Place 18  mcg into inhaler and inhale daily.     Marland Kitchen zolpidem (AMBIEN) 5 MG tablet Take 5 mg by mouth at bedtime. For sleep     No current facility-administered medications for this visit.     Past Surgical History  Procedure Laterality Date  . Breast biopsy      left, [facility]  . Cervical conization w/bx  08/11/2011    Procedure: CONIZATION CERVIX WITH BIOPSY;  Surgeon: Lazaro Arms, MD;  Location: [facility]S;  Service: Gynecology;  Laterality: N/A;  Laser Conization of Cervix  . Breast excisional biopsy      Left x2  . Tee without cardioversion  06/19/2012    Procedure: TRANSESOPHAGEAL ECHOCARDIOGRAM (TEE);  Surgeon: Vesta Mixer, MD;  Location: [facility] ENDOSCOPY;  Service:  Cardiovascular;  Laterality: N/A;  . Cardioversion  06/19/2012    Procedure: CARDIOVERSION;  Surgeon: Vesta Mixer, MD;  Location: [facility] ENDOSCOPY;  Service: Cardiovascular;;  . Cardioversion  06/21/2012    Procedure: CARDIOVERSION;  Surgeon: Cassell Clement, MD;  Location: [facility] OR;  Service: Cardiovascular;  Laterality: N/A;  . Left heart catheterization with coronary angiogram N/A 06/15/2012    Procedure: LEFT HEART CATHETERIZATION WITH CORONARY ANGIOGRAM;  Surgeon: Herby Abraham, MD;  Location: [facility] C[facility] LAB;  Service: Cardiovascular;  Laterality: N/A;  . Permanent pacemaker insertion N/A 06/21/2012    Procedure: PERMANENT PACEMAKER INSERTION;  Surgeon: Marinus Maw, MD;  Location: [facility] CATH LAB;  Service: Cardiovascular;  Laterality: N/A;  . Av node ablation N/A 06/21/2012    Procedure: AV NODE ABLATION;  Surgeon: Marinus Maw, MD;  Location: [facility] CATH LAB;  Service: Cardiovascular;  Laterality: N/A;  . Av node ablation N/A 06/22/2012    Procedure: AV NODE ABLATION;  Surgeon: Duke Salvia, MD;  Location: [facility] CATH LAB;  Service: Cardiovascular;  Laterality: N/A;  . Insert / replace / remove pacemaker    . Thoracotomy Left 02/28/2015    Procedure: THORACOTOMY MAJOR;  Surgeon: Alleen Borne, MD;  Location: [facility] OR;  Service: Thoracic;  Laterality: Left;  . Epicardial pacing lead placement N/A 02/28/2015    Procedure: LV EPICARDIAL PACING LEAD PLACEMENT;  Surgeon: Alleen Borne, MD;  Location: [facility] OR;  Service: Thoracic;  Laterality: N/A;     No Known Allergies    Family History  Problem Relation Age of Onset  . Anesthesia problems Neg Hx   . Hypotension Neg Hx   . Malignant hyperthermia Neg Hx   . Pseudochol deficiency Neg Hx   . Cancer Father     carcinoma of the lung  . Cancer Sister     carcinoma of the lung  . Colon cancer Neg Hx   . Cervical cancer Mother   . Irritable bowel syndrome      mother, sister  . Celiac disease Neg Hx   . Inflammatory bowel disease Neg Hx       Social History [name] [name] reports that she has never smoked. She has never used smokeless tobacco. [name] [name] reports that she does not drink alcohol.   Review of Systems CONSTITUTIONAL: No weight loss, fever, chills, weakness or fatigue.  HEENT: Eyes: No visual loss, blurred vision, double vision or yellow sclerae.No hearing loss, sneezing, congestion, runny nose or sore throat.  SKIN: No rash or itching.  CARDIOVASCULAR: per HPI RESPIRATORY: No shortness of breath, cough or sputum.  GASTROINTESTINAL: No anorexia, nausea, vomiting or diarrhea. No abdominal pain or blood.  GENITOURINARY: No burning on urination, no polyuria NEUROLOGICAL:  No headache, dizziness, syncope, paralysis, ataxia, numbness or tingling in the extremities. No change in bowel or bladder control.  MUSCULOSKELETAL: No muscle, back pain, joint pain or stiffness.  LYMPHATICS: No enlarged nodes. No history of splenectomy.  PSYCHIATRIC: No history of depression or anxiety.  ENDOCRINOLOGIC: No reports of sweating, cold or heat intolerance. No polyuria or polydipsia.  Marland Kitchen   Physical Examination Filed Vitals:   04/18/15 0848  BP: 150/88  Pulse: 74   Filed Vitals:   04/18/15 0848  Height: 5\' 4"  (1.626 m)  Weight: 136 lb 12.8 oz (62.052 kg)    Gen: resting comfortably, no acute distress HEENT: no scleral icterus, pupils equal round and reactive, no palptable cervical adenopathy,  CV: RRR, no m/r/, no jvd Resp: Clear to auscultation bilaterally GI: abdomen is soft, non-tender, non-distended, normal bowel sounds, no hepatosplenomegaly MSK: extremities are warm, no edema.  Skin: warm, no rash Neuro:  no focal deficits Psych: appropriate affect   Diagnostic Studies 06/2012 TEE: mod to severe MR, milld to mod TR   06/12/12 Cath  Hemodynamics:  AO 118/68 (83)  LV 119/8  Cannot calculate gradient due to rapid atrial fib  Coronary angiography:  Coronary dominance: right  Left mainstem: Short  without significant disease  Left anterior descending (LAD): There is calcification proximally. There are two major diagonal branches. Between the first and second diagonal branches are approximately 30-40% plaquing. The distal LAD has mild irregularity. Both diagonals have minimal irregularity.  Left circumflex (LCx): Provides one large bifurcation marginal Yariana Hoaglund and a small AV circumflex. There is minor distal irregularity. The AV circ is small  Right coronary artery (RCA): Provides a large PDA and a large and smaller PLA branches. No significant focal obstruction.  Left ventriculography: Due to ectopy, EF cannot be calculated. With AF, difficult to be sure of function but appears 35%. There does appear to be some MR.  Final Conclusions:  1. Atrial fib with rapid ventricular response.  2. Mild calcification of proximal LAD without critical obstruction. No high grade coronary artery disease.  09/2012 Echo: LVEF 25%, severely dilated LV, mild LVH, mild AI, mild MR   09/26/13 Clinic EKG  Afib, V-paced  11/08/13 Echo Study Conclusions  - Left ventricle: The cavity size was moderately dilated. Wall thickness was increased in a pattern of mild LVH. Systolic function was moderately to severely reduced. The estimated ejection fraction was in the range of 30% to 35%. Diffuse hypokinesis. There is severe hypokinesis of the anteroseptal myocardium. Doppler parameters are consistent with restrictive physiology, indicative of decreased left ventricular diastolic compliance and/or increased left atrial pressure. - Ventricular septum: Septal motion showed abnormal function and dyssynergy. - Aortic valve: Trileaflet; mildly thickened leaflets. Mild to moderate regurgitation. - Mitral valve: Mildly thickened leaflets . Moderate regurgitation, appears to be made up of two jets. Suboptimal PISA. - Left atrium: The atrium was severely dilated. - Right ventricle: The cavity size was mildly  dilated. Pacer wire or catheter noted in right ventricle. - Right atrium: The atrium was moderately to severely dilated. - Tricuspid valve: Mild-moderate regurgitation. - Pulmonic valve: Mild regurgitation. - Pulmonary arteries: Systolic pressure was moderately increased. - Pericardium, extracardiac: There was no pericardial effusion. Impressions:  - Mild LVH with moderate chamber dilatation and LVEF 30-35% with diffuse hypokinesis, most prominent anteroseptal wall in setting of septal dyssynergy and pacing. Restrictive diastolic filling pattern with increased filling pressures. Severe left atrial and moderate to severe right atrial enlargement. MIldly thickened mitral valve with overall moderate  mitral regurgitation made up of two jets (PISA suboptimal). Mild to moderate aortic regurgitation. Device wire in right heart. MIld to moderate tricuspid regurgitation with PASP 51 mmHg.      Assessment and Plan  1. NICM/Chronic systolic HF - LVEF 30-35% by echo 10/2013, NYHA 2.  - remains euvolemic - continue current meds   2. Afib  - hx of av nodal ablation with pacemaker  - denies any symptoms - continue xarelto for stroke prophylaxis.    3. Mitral regurgitation  - moderate to severe by TEE 06/2012, follow up TTE's have been stable mild to moderate. No current symptoms, likely functional MR related to her LV systolic dysfunction.  - continue to follow clinically.    4. Pacemaker - arrange f/u with device clinic.    5. COPD - followed by Dr Juanetta Gosling.   F/u 3 months    [name], M.D.

## 2015-04-24 ENCOUNTER — Other Ambulatory Visit: Payer: Self-pay | Admitting: *Deleted

## 2015-04-24 ENCOUNTER — Other Ambulatory Visit: Payer: Self-pay | Admitting: Cardiology

## 2015-04-30 ENCOUNTER — Other Ambulatory Visit: Payer: Self-pay | Admitting: Cardiology

## 2015-04-30 MED ORDER — LISINOPRIL 5 MG PO TABS: 5.0000 mg | ORAL_TABLET | Freq: Every day | ORAL | Status: DC

## 2015-04-30 NOTE — Telephone Encounter (Signed)
Patient called statting that she needs refill on lisinopril (PRINIVIL,ZESTRIL) 5 MG tablet Please call [facility] in [place], [place]   [phone]

## 2015-04-30 NOTE — Telephone Encounter (Signed)
Done

## 2015-05-20 DIAGNOSIS — M10072 Idiopathic gout, left ankle and foot: Secondary | ICD-10-CM | POA: Diagnosis not present

## 2015-05-20 DIAGNOSIS — M7742 Metatarsalgia, left foot: Secondary | ICD-10-CM | POA: Diagnosis not present

## 2015-05-20 DIAGNOSIS — M7752 Other enthesopathy of left foot: Secondary | ICD-10-CM | POA: Diagnosis not present

## 2015-06-17 DIAGNOSIS — M10072 Idiopathic gout, left ankle and foot: Secondary | ICD-10-CM | POA: Diagnosis not present

## 2015-06-17 DIAGNOSIS — M21612 Bunion of left foot: Secondary | ICD-10-CM | POA: Diagnosis not present

## 2015-06-30 ENCOUNTER — Encounter: Payer: Self-pay | Admitting: Internal Medicine

## 2015-06-30 ENCOUNTER — Ambulatory Visit (INDEPENDENT_AMBULATORY_CARE_PROVIDER_SITE_OTHER): Payer: Medicare Other | Admitting: Internal Medicine

## 2015-06-30 VITALS — BP 120/80 | HR 58 | Ht 64.0 in | Wt 139.0 lb

## 2015-06-30 DIAGNOSIS — I10 Essential (primary) hypertension: Secondary | ICD-10-CM

## 2015-06-30 DIAGNOSIS — I4891 Unspecified atrial fibrillation: Secondary | ICD-10-CM | POA: Diagnosis not present

## 2015-06-30 DIAGNOSIS — I5022 Chronic systolic (congestive) heart failure: Secondary | ICD-10-CM

## 2015-06-30 DIAGNOSIS — I428 Other cardiomyopathies: Secondary | ICD-10-CM

## 2015-06-30 DIAGNOSIS — I429 Cardiomyopathy, unspecified: Secondary | ICD-10-CM

## 2015-06-30 DIAGNOSIS — Z950 Presence of cardiac pacemaker: Secondary | ICD-10-CM

## 2015-06-30 LAB — CUP PACEART INCLINIC DEVICE CHECK
Battery Remaining Longevity: 34 mo
Battery Voltage: 2.97 V
Brady Statistic AP VP Percent: 0 %
Brady Statistic AS VP Percent: 90.33 %
Brady Statistic AS VS Percent: 9.67 %
Brady Statistic RA Percent Paced: 0 %
Brady Statistic RV Percent Paced: 90.33 %
Implantable Lead Implant Date: 20131120
Implantable Lead Implant Date: 20131120
Implantable Lead Implant Date: 20131120
Implantable Lead Location: 753858
Implantable Lead Location: 753859
Implantable Lead Location: 753860
Implantable Lead Model: 4196
Implantable Lead Model: 5076
Implantable Lead Model: 5076
Lead Channel Impedance Value: 323 Ohm
Lead Channel Impedance Value: 4047 Ohm
Lead Channel Impedance Value: 4047 Ohm
Lead Channel Impedance Value: 4047 Ohm
Lead Channel Impedance Value: 475 Ohm
Lead Channel Impedance Value: 513 Ohm
Lead Channel Pacing Threshold Pulse Width: 0.5 ms
Lead Channel Sensing Intrinsic Amplitude: 0.375 mV
Lead Channel Sensing Intrinsic Amplitude: 0.5 mV
Lead Channel Setting Pacing Pulse Width: 0.4 ms
Lead Channel Setting Pacing Pulse Width: 0.5 ms
MDC IDC MSMT LEADCHNL LV IMPEDANCE VALUE: 285 Ohm
MDC IDC MSMT LEADCHNL LV PACING THRESHOLD AMPLITUDE: 2.125 V
MDC IDC MSMT LEADCHNL RV IMPEDANCE VALUE: 418 Ohm
MDC IDC MSMT LEADCHNL RV IMPEDANCE VALUE: 608 Ohm
MDC IDC MSMT LEADCHNL RV PACING THRESHOLD AMPLITUDE: 0.5 V
MDC IDC MSMT LEADCHNL RV PACING THRESHOLD PULSEWIDTH: 0.4 ms
MDC IDC MSMT LEADCHNL RV SENSING INTR AMPL: 5.375 mV
MDC IDC MSMT LEADCHNL RV SENSING INTR AMPL: 8 mV
MDC IDC SESS DTM: 20161128101616
MDC IDC SET LEADCHNL LV PACING AMPLITUDE: 3.25 V
MDC IDC SET LEADCHNL RV PACING AMPLITUDE: 2 V
MDC IDC SET LEADCHNL RV SENSING SENSITIVITY: 4 mV
MDC IDC STAT BRADY AP VS PERCENT: 0 %

## 2015-06-30 MED ORDER — FUROSEMIDE 40 MG PO TABS: 40.0000 mg | ORAL_TABLET | Freq: Every day | ORAL | Status: DC

## 2015-06-30 NOTE — Assessment & Plan Note (Signed)
Her blood pressure is well controlled. Will follow.  

## 2015-06-30 NOTE — Assessment & Plan Note (Signed)
Her ventricular rate is well controlled, s/p AV node ablation.

## 2015-06-30 NOTE — Progress Notes (Signed)
HPI [name] returns today for followup. She is a pleasant 59 yo woman with a non-ischemic CM, EF 30-35%, chronic atrial fib with an RVR, s/p AV node ablation and PPM insertion. She developed diaphragmatic stimulation and had to have her LV lead turned off and then developed worsening LV function from RV apical pacing and has undergone insertion of an LV epicardial lead. She is still sob. No edema.   No Known Allergies   Current Outpatient Prescriptions  Medication Sig Dispense Refill  . albuterol (PROVENTIL HFA;VENTOLIN HFA) 108 (90 BASE) MCG/ACT inhaler Inhale 2 puffs into the lungs every 6 (six) hours as needed for wheezing or shortness of breath. 1 Inhaler 2  . ALPRAZolam (XANAX) 0.25 MG tablet Take 0.25 mg by mouth 2 (two) times daily as needed for anxiety.    . calcium carbonate (TUMS - DOSED IN MG ELEMENTAL CALCIUM) 500 MG chewable tablet Chew 2 tablets by mouth 3 (three) times daily.    . carvedilol (COREG) 12.5 MG tablet Take 1 tablet (12.5 mg total) by mouth 2 (two) times daily. 60 tablet 1  . furosemide (LASIX) 40 MG tablet Take 20 mg by mouth daily.    Marland Kitchen lisinopril (PRINIVIL,ZESTRIL) 5 MG tablet Take 1 tablet (5 mg total) by mouth daily. 30 tablet 6  . magnesium oxide (MAG-OX) 400 MG tablet TAKE 2 TIMES DAILY UNTIL 01/06/15 THEN TAKE 1 TAB DAILY 38 tablet 3  . oxyCODONE (OXY IR/ROXICODONE) 5 MG immediate release tablet Take 1-2 tablets (5-10 mg total) by mouth every 4 (four) hours as needed for severe pain. 30 tablet 0  . pantoprazole (PROTONIX) 40 MG tablet Take 40 mg by mouth daily.    . sertraline (ZOLOFT) 50 MG tablet Take 50 mg by mouth daily.    Marland Kitchen SPIRIVA HANDIHALER 18 MCG inhalation capsule Place 18 mcg into inhaler and inhale daily.     Carlena Hurl 20 MG TABS tablet TAKE ONE TABLET BY MOUTH ONCE DAILY 30 tablet 6  . zolpidem (AMBIEN) 5 MG tablet Take 5 mg by mouth at bedtime. For sleep     No current facility-administered medications for this visit.     Past Medical  History  Diagnosis Date  . Anxiety and depression   . Atrial fibrillation (HCC)     Echocardiogram in 2004-borderline LV function; no valvular abnormalities; 06/2012: AV nodal ablation + biventricular pacemaker  . Anemia     2011: Hemoglobin of 11.4; hematocrit of 33.5; normal MCV  . Cardiac arrest (HCC) 06/2012    16 day hospitalization; caused by hyperkalemia; multiple complications including CHF; permanent atrial fibrillation requiring AV nodal ablation + pacing  . Thyroid disease   . Nonischemic cardiomyopathy (HCC)     Echo 06/2012: EF 30-35%, moderate to severe MR; cardiac cath 06/2012:30-40% D1 and D2, estimated EF-35%, MR-not graded; right bundle branch block  . Dysrhythmia   . Presence of permanent cardiac pacemaker   . CHF (congestive heart failure) (HCC)   . Shortness of breath dyspnea   . Depression   . Anxiety   . Urinary frequency   . GERD (gastroesophageal reflux disease)     protonix  . COPD (chronic obstructive pulmonary disease) (HCC)     heterozygous alpha-1 anti-trypsin deficiency    ROS:   All systems reviewed and negative except as noted in the HPI.   Past Surgical History  Procedure Laterality Date  . Breast biopsy      left, [facility]  . Cervical conization w/bx  08/11/2011  Procedure: CONIZATION CERVIX WITH BIOPSY;  Surgeon: Lazaro Arms, MD;  Location: [facility];  Service: Gynecology;  Laterality: N/A;  Laser Conization of Cervix  . Breast excisional biopsy      Left x2  . Tee without cardioversion  06/19/2012    Procedure: TRANSESOPHAGEAL ECHOCARDIOGRAM (TEE);  Surgeon: Vesta Mixer, MD;  Location: [facility] ENDOSCOPY;  Service: Cardiovascular;  Laterality: N/A;  . Cardioversion  06/19/2012    Procedure: CARDIOVERSION;  Surgeon: Vesta Mixer, MD;  Location: [facility] ENDOSCOPY;  Service: Cardiovascular;;  . Cardioversion  06/21/2012    Procedure: CARDIOVERSION;  Surgeon: Cassell Clement, MD;  Location: [facility] OR;  Service: Cardiovascular;  Laterality: N/A;  . Left  heart catheterization with coronary angiogram N/A 06/15/2012    Procedure: LEFT HEART CATHETERIZATION WITH CORONARY ANGIOGRAM;  Surgeon: Herby Abraham, MD;  Location: [facility] CATH LAB;  Service: Cardiovascular;  Laterality: N/A;  . Permanent pacemaker insertion N/A 06/21/2012    Procedure: PERMANENT PACEMAKER INSERTION;  Surgeon: Marinus Maw, MD;  Location: [facility] CATH LAB;  Service: Cardiovascular;  Laterality: N/A;  . Av node ablation N/A 06/21/2012    Procedure: AV NODE ABLATION;  Surgeon: Marinus Maw, MD;  Location: [facility] CATH LAB;  Service: Cardiovascular;  Laterality: N/A;  . Av node ablation N/A 06/22/2012    Procedure: AV NODE ABLATION;  Surgeon: Duke Salvia, MD;  Location: [facility] CATH LAB;  Service: Cardiovascular;  Laterality: N/A;  . Insert / replace / remove pacemaker    . Thoracotomy Left 02/28/2015    Procedure: THORACOTOMY MAJOR;  Surgeon: Alleen Borne, MD;  Location: [facility] OR;  Service: Thoracic;  Laterality: Left;  . Epicardial pacing lead placement N/A 02/28/2015    Procedure: LV EPICARDIAL PACING LEAD PLACEMENT;  Surgeon: Alleen Borne, MD;  Location: [facility] OR;  Service: Thoracic;  Laterality: N/A;     Family History  Problem Relation Age of Onset  . Anesthesia problems Neg Hx   . Hypotension Neg Hx   . Malignant hyperthermia Neg Hx   . Pseudochol deficiency Neg Hx   . Cancer Father     carcinoma of the lung  . Cancer Sister     carcinoma of the lung  . Colon cancer Neg Hx   . Cervical cancer Mother   . Irritable bowel syndrome      mother, sister  . Celiac disease Neg Hx   . Inflammatory bowel disease Neg Hx      Social History   Social History  . Marital Status: Divorced    Spouse Name: N/A  . Number of Children: 2  . Years of Education: N/A   Occupational History  . Surveyor, quantity  . 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 Smoking status: Never Smoker   . Smokeless tobacco: Never Used  . Alcohol Use: No  . Drug Use:  No  . Sexual Activity: Not on file   Other Topics Concern  . Not on file   Social History Narrative   ** Merged History Encounter **       ** Data from: 06/22/12 Enc Dept: [facility]       ** Data from: 06/05/12 Enc Dept: [facility]   Lives alone with good family support from sisters.            BP 120/80 mmHg  Pulse 58  Ht  (1.626 m)  Wt 139 lb (63.05  kg)  BMI 23.85 kg/m2  SpO2 95%  Physical Exam:  Well appearing middle aged woman, NAD HEENT: Unremarkable Neck:  7 cm JVD, no thyromegally Back:  No CVA tenderness Lungs:  Clear with no wheezes or rhonchi. Minimal rales. Well healed PPM insertion. HEART:  Regular rate rhythm, no murmurs, no rubs, no clicks Abd:  soft, positive bowel sound,s, no organomegally, no rebound, no guarding Ext:  2 plus pulses, no edema, no cyanosis, no clubbing Skin:  No rashes no nodules Neuro:  CN II through XII intact, motor grossly intact  DEVICE  Normal device function.  See PaceArt for details.  Assess/Plan:

## 2015-06-30 NOTE — Assessment & Plan Note (Signed)
Her symptoms remain class 2. She is sob when she gets in a hurry. Her optivol is elevated. I have asked her to increase her lasix to 40 mg daily.

## 2015-06-30 NOTE — Patient Instructions (Addendum)
Your physician wants you to follow-up in: June with Dr. Ladona Ridgel. You will receive a reminder letter in the mail two months in advance. If you don't receive a letter, please call our office to schedule the follow-up appointment.  Remote monitoring is used to monitor your Pacemaker of ICD from home. This monitoring reduces the number of office visits required to check your device to one time per year. It allows [place] to keep an eye on the functioning of your device to ensure it is working properly. You are scheduled for a device check from home on 09/29/15. You may send your transmission at any time that day. If you have a wireless device, the transmission will be sent automatically. After your physician reviews your transmission, you will receive a postcard with your next transmission date.  Your physician has recommended you make the following change in your medication:  Increase Lasix to 40 mg Daily   If you need a refill on your cardiac medications before your next appointment, please call your pharmacy.  Thank you for choosing [facility]!

## 2015-06-30 NOTE — Assessment & Plan Note (Signed)
Her medtronic BiV PM is working satisfactory although her LV pacing threshold is a bit high. Will follow.

## 2015-07-22 ENCOUNTER — Ambulatory Visit: Payer: Medicare Other | Admitting: Cardiology

## 2015-07-30 ENCOUNTER — Ambulatory Visit (INDEPENDENT_AMBULATORY_CARE_PROVIDER_SITE_OTHER): Payer: Medicare Other | Admitting: Cardiology

## 2015-07-30 ENCOUNTER — Encounter: Payer: Self-pay | Admitting: Cardiology

## 2015-07-30 VITALS — BP 102/58 | HR 61 | Ht 64.0 in | Wt 131.0 lb

## 2015-07-30 DIAGNOSIS — I340 Nonrheumatic mitral (valve) insufficiency: Secondary | ICD-10-CM | POA: Diagnosis not present

## 2015-07-30 DIAGNOSIS — I5022 Chronic systolic (congestive) heart failure: Secondary | ICD-10-CM | POA: Diagnosis not present

## 2015-07-30 DIAGNOSIS — Z79899 Other long term (current) drug therapy: Secondary | ICD-10-CM | POA: Diagnosis not present

## 2015-07-30 DIAGNOSIS — I4891 Unspecified atrial fibrillation: Secondary | ICD-10-CM | POA: Diagnosis not present

## 2015-07-30 MED ORDER — TIOTROPIUM BROMIDE MONOHYDRATE 18 MCG IN CAPS: 18.0000 ug | ORAL_CAPSULE | Freq: Every day | RESPIRATORY_TRACT | Status: DC

## 2015-07-30 MED ORDER — MAGNESIUM OXIDE 400 MG PO TABS: ORAL_TABLET | ORAL | Status: DC

## 2015-07-30 NOTE — Patient Instructions (Signed)
Medication Instructions:  Your physician recommends that you continue on your current medications as directed. Please refer to the Current Medication list given to you today.   Labwork: Your physician recommends that you return for lab work in: ASAP MAGNESIUM  BMET   Testing/Procedures: Your physician has requested that you have an echocardiogram. Echocardiography is a painless test that uses sound waves to create images of your heart. It provides your doctor with information about the size and shape of your heart and how well your heart's chambers and valves are working. This procedure takes approximately one hour. There are no restrictions for this procedure.    Follow-Up: Your physician recommends that you schedule a follow-up appointment in: 3 MONTHS WITH DR. BRANCH    Any Other Special Instructions Will Be Listed Below (If Applicable).     If you need a refill on your cardiac medications before your next appointment, please call your pharmacy.

## 2015-07-30 NOTE — Progress Notes (Signed)
Patient ID: RADLEY TESTON, female   DOB: [birth year], 59 y.o.   MRN: [id number]     Clinical Summary Ms. Knepp is a 59 y.o.female seen today for follow up of the following medical problems.  1. NICM  - echo 10/2013 LVEF 30-35%, restrictive diastolic dysfunction - cath Jan 2011 showed no obstructive CAD  - denies any SOB or DOE. No recent swelling.  - weights at home stable at 136 lbs.   - 07/01/15 her optivol was elevated, Dr Ladona Ridgel increased her lasix to  daily.  - denies any recent SOB or DOE. Weight is down 136 to 131 lbs.    2. Chronic afib  - prior AV nodal ablation with permanent pacemaker   - no recent palpitations. No bleeding troubles on xarelto    3. Moderate MR - stable from last echo, no significant symptoms  4. Pacemaker - she has a Medtronic BiV pacemaker followed by Dr Ladona Ridgel, LV lead replaced epicardial by Dr Laneta Simmers 01/2015 - normal device function by 06/2015 check, though LV pacing threhold elevated, to monitor for now per Dr Ladona Ridgel.   5. COPD - management per Dr Juanetta Gosling.   6. HTN - complaint with meds Past Medical History  Diagnosis Date  . Anxiety and depression   . Atrial fibrillation (HCC)     Echocardiogram in 2004-borderline LV function; no valvular abnormalities; 06/2012: AV nodal ablation + biventricular pacemaker  . Anemia     2011: Hemoglobin of 11.4; hematocrit of 33.5; normal MCV  . Cardiac arrest (HCC) 06/2012    16 day hospitalization; caused by hyperkalemia; multiple complications including CHF; permanent atrial fibrillation requiring AV nodal ablation + pacing  . Thyroid disease   . Nonischemic cardiomyopathy (HCC)     Echo 06/2012: EF 30-35%, moderate to severe MR; cardiac cath 06/2012:30-40% D1 and D2, estimated EF-35%, MR-not graded; right bundle Teniola Tseng block  . Dysrhythmia   . Presence of permanent cardiac pacemaker   . CHF (congestive heart failure) (HCC)   . Shortness of breath dyspnea   . Depression   . Anxiety   .  Urinary frequency   . GERD (gastroesophageal reflux disease)     protonix  . COPD (chronic obstructive pulmonary disease) (HCC)     heterozygous alpha-1 anti-trypsin deficiency     No Known Allergies   Current Outpatient Prescriptions  Medication Sig Dispense Refill  . albuterol (PROVENTIL HFA;VENTOLIN HFA) 108 (90 BASE) MCG/ACT inhaler Inhale 2 puffs into the lungs every 6 (six) hours as needed for wheezing or shortness of breath. 1 Inhaler 2  . ALPRAZolam (XANAX) 0.25 MG tablet Take 0.25 mg by mouth 2 (two) times daily as needed for anxiety.    . calcium carbonate (TUMS - DOSED IN MG ELEMENTAL CALCIUM) 500 MG chewable tablet Chew 2 tablets by mouth 3 (three) times daily.    . carvedilol (COREG) 12.5 MG tablet Take 1 tablet (12.5 mg total) by mouth 2 (two) times daily. 60 tablet 1  . furosemide (LASIX) 40 MG tablet Take 1 tablet (40 mg total) by mouth daily. 90 tablet 3  . lisinopril (PRINIVIL,ZESTRIL) 5 MG tablet Take 1 tablet (5 mg total) by mouth daily. 30 tablet 6  . magnesium oxide (MAG-OX) 400 MG tablet TAKE 2 TIMES DAILY UNTIL 01/06/15 THEN TAKE 1 TAB DAILY 38 tablet 3  . oxyCODONE (OXY IR/ROXICODONE) 5 MG immediate release tablet Take 1-2 tablets (5-10 mg total) by mouth every 4 (four) hours as needed for severe pain. 30 tablet 0  .  pantoprazole (PROTONIX) 40 MG tablet Take 40 mg by mouth daily.    . sertraline (ZOLOFT) 50 MG tablet Take 50 mg by mouth daily.    Marland Kitchen SPIRIVA HANDIHALER 18 MCG inhalation capsule Place 18 mcg into inhaler and inhale daily.     Carlena Hurl 20 MG TABS tablet TAKE ONE TABLET BY MOUTH ONCE DAILY 30 tablet 6  . zolpidem (AMBIEN) 5 MG tablet Take 5 mg by mouth at bedtime. For sleep     No current facility-administered medications for this visit.     Past Surgical History  Procedure Laterality Date  . Breast biopsy      left, [facility]  . Cervical conization w/bx  08/11/2011    Procedure: CONIZATION CERVIX WITH BIOPSY;  Surgeon: Lazaro Arms, MD;  Location: [facility]  ORS;  Service: Gynecology;  Laterality: N/A;  Laser Conization of Cervix  . Breast excisional biopsy      Left x2  . Tee without cardioversion  06/19/2012    Procedure: TRANSESOPHAGEAL ECHOCARDIOGRAM (TEE);  Surgeon: Vesta Mixer, MD;  Location[facility] ENDOSCOPY;  Service: Cardiovascular;  Laterality: N/A;  . Cardioversion  06/19/2012    Procedure: CARDIOVERSION;  Surgeon: Vesta Mixer, MD;  Location: [facility] ENDOSCOPY;  Service: Cardiovascular;;  . Cardioversion  06/21/2012    Procedure: CARDIOVERSION;  Surgeon: Cassell Clement, MD;  Location: [facility] OR;  Service: Cardiovascular;  Laterality: N/A;  . Left heart catheterization with coronary angiogram N/A 06/15/2012    Procedure: LEFT HEART CATHETERIZATION WITH CORONARY ANGIOGRAM;  Surgeon: Herby Abraham, MD;  Location: [facility] CATH LAB;  Service: Cardiovascular;  Laterality: N/A;  . Permanent pacemaker insertion N/A 06/21/2012    Procedure: PERMANENT PACEMAKER INSERTION;  Surgeon: Marinus Maw, MD;  Location: [facility] CATH LAB;  Service: Cardiovascular;  Laterality: N/A;  . Av node ablation N/A 06/21/2012    Procedure: AV NODE ABLATION;  Surgeon: Marinus Maw, MD;  Location: [facility] CATH LAB;  Service: Cardiovascular;  Laterality: N/A;  . Av node ablation N/A 06/22/2012    Procedure: AV NODE ABLATION;  Surgeon: Duke Salvia, MD;  Location: [facility] CATH LAB;  Service: Cardiovascular;  Laterality: N/A;  . Insert / replace / remove pacemaker    . Thoracotomy Left 02/28/2015    Procedure: THORACOTOMY MAJOR;  Surgeon: Alleen Borne, MD;  Location: [facility] OR;  Service: Thoracic;  Laterality: Left;  . Epicardial pacing lead placement N/A 02/28/2015    Procedure: LV EPICARDIAL PACING LEAD PLACEMENT;  Surgeon: Alleen Borne, MD;  Location: [facility] OR;  Service: Thoracic;  Laterality: N/A;     No Known Allergies    Family History  Problem Relation Age of Onset  . Anesthesia problems Neg Hx   . Hypotension Neg Hx   . Malignant hyperthermia Neg Hx   . Pseudochol  deficiency Neg Hx   . Cancer Father     carcinoma of the lung  . Cancer Sister     carcinoma of the lung  . Colon cancer Neg Hx   . Cervical cancer Mother   . Irritable bowel syndrome      mother, sister  . Celiac disease Neg Hx   . Inflammatory bowel disease Neg Hx      Social History Ms. Penny reports that she has never smoked. She has never used smokeless tobacco. Ms. Fischbach reports that she does not drink alcohol.   Review of Systems CONSTITUTIONAL: No weight loss, fever, chills, weakness or fatigue.  HEENT: Eyes: No visual loss, blurred  vision, double vision or yellow sclerae.No hearing loss, sneezing, congestion, runny nose or sore throat.  SKIN: No rash or itching.  CARDIOVASCULAR: per hpi RESPIRATORY: No shortness of breath, cough or sputum.  GASTROINTESTINAL: No anorexia, nausea, vomiting or diarrhea. No abdominal pain or blood.  GENITOURINARY: No burning on urination, no polyuria NEUROLOGICAL: No headache, dizziness, syncope, paralysis, ataxia, numbness or tingling in the extremities. No change in bowel or bladder control.  MUSCULOSKELETAL: No muscle, back pain, joint pain or stiffness.  LYMPHATICS: No enlarged nodes. No history of splenectomy.  PSYCHIATRIC: No history of depression or anxiety.  ENDOCRINOLOGIC: No reports of sweating, cold or heat intolerance. No polyuria or polydipsia.  Marland Kitchen   Physical Examination Filed Vitals:   07/30/15 1611  BP: 102/58  Pulse: 61   Filed Vitals:   07/30/15 1611  Height: 5\' 4"  (1.626 m)  Weight: 131 lb (59.421 kg)    Gen: resting comfortably, no acute distress HEENT: no scleral icterus, pupils equal round and reactive, no palptable cervical adenopathy,  CV: RRR, 2/6 systolic murmur at apex, no jvd Resp: Clear to auscultation bilaterally GI: abdomen is soft, non-tender, non-distended, normal bowel sounds, no hepatosplenomegaly MSK: extremities are warm, no edema.  Skin: warm, no rash Neuro:  no focal deficits Psych:  appropriate affect   Diagnostic Studies 06/2012 TEE: mod to severe MR, milld to mod TR   06/12/12 Cath  Hemodynamics:  AO 118/68 (83)  LV 119/8  Cannot calculate gradient due to rapid atrial fib  Coronary angiography:  Coronary dominance: right  Left mainstem: Short without significant disease  Left anterior descending (LAD): There is calcification proximally. There are two major diagonal branches. Between the first and second diagonal branches are approximately 30-40% plaquing. The distal LAD has mild irregularity. Both diagonals have minimal irregularity.  Left circumflex (LCx): Provides one large bifurcation marginal Shelma Eiben and a small AV circumflex. There is minor distal irregularity. The AV circ is small  Right coronary artery (RCA): Provides a large PDA and a large and smaller PLA branches. No significant focal obstruction.  Left ventriculography: Due to ectopy, EF cannot be calculated. With AF, difficult to be sure of function but appears 35%. There does appear to be some MR.  Final Conclusions:  1. Atrial fib with rapid ventricular response.  2. Mild calcification of proximal LAD without critical obstruction. No high grade coronary artery disease.  09/2012 Echo: LVEF 25%, severely dilated LV, mild LVH, mild AI, mild MR   09/26/13 Clinic EKG  Afib, V-paced  11/08/13 Echo Study Conclusions  - Left ventricle: The cavity size was moderately dilated. Wall thickness was increased in a pattern of mild LVH. Systolic function was moderately to severely reduced. The estimated ejection fraction was in the range of 30% to 35%. Diffuse hypokinesis. There is severe hypokinesis of the anteroseptal myocardium. Doppler parameters are consistent with restrictive physiology, indicative of decreased left ventricular diastolic compliance and/or increased left atrial pressure. - Ventricular septum: Septal motion showed abnormal function and dyssynergy. - Aortic valve:  Trileaflet; mildly thickened leaflets. Mild to moderate regurgitation. - Mitral valve: Mildly thickened leaflets . Moderate regurgitation, appears to be made up of two jets. Suboptimal PISA. - Left atrium: The atrium was severely dilated. - Right ventricle: The cavity size was mildly dilated. Pacer wire or catheter noted in right ventricle. - Right atrium: The atrium was moderately to severely dilated. - Tricuspid valve: Mild-moderate regurgitation. - Pulmonic valve: Mild regurgitation. - Pulmonary arteries: Systolic pressure was moderately increased. - Pericardium, extracardiac:  There was no pericardial effusion. Impressions:  - Mild LVH with moderate chamber dilatation and LVEF 30-35% with diffuse hypokinesis, most prominent anteroseptal wall in setting of septal dyssynergy and pacing. Restrictive diastolic filling pattern with increased filling pressures. Severe left atrial and moderate to severe right atrial enlargement. MIldly thickened mitral valve with overall moderate mitral regurgitation made up of two jets (PISA suboptimal). Mild to moderate aortic regurgitation. Device wire in right heart. MIld to moderate tricuspid regurgitation with PASP 51 mmHg.    Assessment and Plan   1. NICM/Chronic systolic HF - LVEF 30-35% by echo 10/2013, NYHA 2.  - weight down 5 lbs since her lasix was increased to 40mg  daily after her optivol was elevated, she will continue current dose. Counseled if drops below 130 to cut back to 20mg  daily of lasix. - we will repeat echo at next visit now that she has CRT.  - check BMET and Mg since lasix increase - soft bp's has limited further medication titration  2. Afib  - hx of av nodal ablation with pacemaker  - denies any symptoms - continue xarelto for stroke prophylaxis.    3. Mitral regurgitation  - moderate to severe by TEE 06/2012, follow up TTE's have been stable mild to moderate. No current symptoms, likely functional MR  related to her LV systolic dysfunction.  - f/u repeat echo   4. Pacemaker - continue to follow with device clinic   5. COPD - followed by Dr Juanetta Gosling.  - we will refill her spiriva for her  F/u 3 months     [name], M.D

## 2015-08-01 ENCOUNTER — Telehealth: Payer: Self-pay

## 2015-08-01 LAB — BASIC METABOLIC PANEL
BUN: 32 mg/dL — ABNORMAL HIGH (ref 7–25)
CHLORIDE: 101 mmol/L (ref 98–110)
CO2: 23 mmol/L (ref 20–31)
Calcium: 9.7 mg/dL (ref 8.6–10.4)
Creat: 1.29 mg/dL — ABNORMAL HIGH (ref 0.50–0.99)
Glucose, Bld: 104 mg/dL — ABNORMAL HIGH (ref 65–99)
POTASSIUM: 3.8 mmol/L (ref 3.5–5.3)
Sodium: 137 mmol/L (ref 135–146)

## 2015-08-01 LAB — MAGNESIUM: Magnesium: 1.6 mg/dL (ref 1.5–2.5)

## 2015-08-01 MED ORDER — FUROSEMIDE 20 MG PO TABS: ORAL_TABLET | ORAL | Status: DC

## 2015-08-01 NOTE — Telephone Encounter (Signed)
Left private vm for pt to decrease dose of lasix,sent copy to pcp

## 2015-08-01 NOTE — Telephone Encounter (Signed)
-----   Message from [name], MD sent at 08/01/2015  9:53 AM EST ----- Labs show mild increased strain on her kidneys due to the higher lasix dose and fluid loss. i would decrease her back to 20mg  daily, but on days her weights are above 133 lbs I would take the 40mg  tablet.  Dominga Ferry MD

## 2015-08-25 ENCOUNTER — Other Ambulatory Visit: Payer: Self-pay | Admitting: Internal Medicine

## 2015-08-26 ENCOUNTER — Other Ambulatory Visit: Payer: Self-pay | Admitting: Internal Medicine

## 2015-08-27 DIAGNOSIS — I482 Chronic atrial fibrillation: Secondary | ICD-10-CM | POA: Diagnosis not present

## 2015-08-27 DIAGNOSIS — I509 Heart failure, unspecified: Secondary | ICD-10-CM | POA: Diagnosis not present

## 2015-08-27 DIAGNOSIS — E1121 Type 2 diabetes mellitus with diabetic nephropathy: Secondary | ICD-10-CM | POA: Diagnosis not present

## 2015-08-27 DIAGNOSIS — I10 Essential (primary) hypertension: Secondary | ICD-10-CM | POA: Diagnosis not present

## 2015-08-27 DIAGNOSIS — J449 Chronic obstructive pulmonary disease, unspecified: Secondary | ICD-10-CM | POA: Diagnosis not present

## 2015-09-03 ENCOUNTER — Ambulatory Visit: Payer: Self-pay | Admitting: Cardiology

## 2015-09-18 DIAGNOSIS — M10072 Idiopathic gout, left ankle and foot: Secondary | ICD-10-CM | POA: Diagnosis not present

## 2015-09-18 DIAGNOSIS — M7752 Other enthesopathy of left foot: Secondary | ICD-10-CM | POA: Diagnosis not present

## 2015-09-18 DIAGNOSIS — M7742 Metatarsalgia, left foot: Secondary | ICD-10-CM | POA: Diagnosis not present

## 2015-09-29 ENCOUNTER — Telehealth: Payer: Self-pay | Admitting: Cardiology

## 2015-09-29 ENCOUNTER — Ambulatory Visit (INDEPENDENT_AMBULATORY_CARE_PROVIDER_SITE_OTHER): Payer: Medicare Other | Admitting: *Deleted

## 2015-09-29 DIAGNOSIS — Z950 Presence of cardiac pacemaker: Secondary | ICD-10-CM

## 2015-09-29 DIAGNOSIS — I429 Cardiomyopathy, unspecified: Secondary | ICD-10-CM

## 2015-09-29 DIAGNOSIS — I5022 Chronic systolic (congestive) heart failure: Secondary | ICD-10-CM | POA: Diagnosis not present

## 2015-09-29 NOTE — Telephone Encounter (Signed)
Patient called with questions about how to use her WireX.  Patient was able to find a stronger signal near a different window.  Walked patient through manual transmission steps.  Advised patient that I will call her back when transmission is received (or will call her to let her know it hasn't been received).  Patient verbalizes understanding of instructions and denies additional questions or concerns at this time.  She is appreciative of assistance.

## 2015-09-29 NOTE — Telephone Encounter (Signed)
Called patient to advise that transmission was successfully received.  Advised that she should receive a letter in the mail with her next remote transmission date on it.  Patient verbalizes understanding and denies questions or concerns at this time.

## 2015-09-29 NOTE — Telephone Encounter (Signed)
Spoke with pt and reminded pt of remote transmission that is due today. Pt verbalized understanding.   

## 2015-09-30 NOTE — Progress Notes (Signed)
Remote pacemaker transmission.   

## 2015-10-06 ENCOUNTER — Telehealth: Payer: Self-pay | Admitting: Cardiology

## 2015-10-06 DIAGNOSIS — M7752 Other enthesopathy of left foot: Secondary | ICD-10-CM | POA: Diagnosis not present

## 2015-10-06 DIAGNOSIS — M7742 Metatarsalgia, left foot: Secondary | ICD-10-CM | POA: Diagnosis not present

## 2015-10-06 DIAGNOSIS — M10072 Idiopathic gout, left ankle and foot: Secondary | ICD-10-CM | POA: Diagnosis not present

## 2015-10-06 NOTE — Telephone Encounter (Signed)
Pt says Xarelto was going to cost over $300, pt called insurance who gave pt PA [employer] phone # for provider to call. Called and received approval for Xarelto 20 mg #[id number]. Pt made aware that PA was approved.

## 2015-10-06 NOTE — Telephone Encounter (Signed)
[name] [name] called stating that she is having problems getting her Xarelto 20 mg filled

## 2015-10-17 ENCOUNTER — Encounter: Payer: Self-pay | Admitting: Cardiology

## 2015-10-17 LAB — CUP PACEART REMOTE DEVICE CHECK
Battery Remaining Longevity: 35 mo
Battery Voltage: 2.98 V
Brady Statistic AP VP Percent: 0 %
Brady Statistic AS VP Percent: 87.71 %
Brady Statistic AS VS Percent: 12.29 %
Brady Statistic RA Percent Paced: 0 %
Implantable Lead Implant Date: 20131120
Implantable Lead Implant Date: 20131120
Implantable Lead Implant Date: 20131120
Implantable Lead Location: 753859
Implantable Lead Location: 753860
Implantable Lead Model: 5076
Lead Channel Impedance Value: 4047 Ohm
Lead Channel Impedance Value: 4047 Ohm
Lead Channel Impedance Value: 494 Ohm
Lead Channel Impedance Value: 494 Ohm
Lead Channel Impedance Value: 551 Ohm
Lead Channel Impedance Value: 665 Ohm
Lead Channel Pacing Threshold Amplitude: 2.25 V
Lead Channel Pacing Threshold Pulse Width: 0.4 ms
Lead Channel Pacing Threshold Pulse Width: 0.5 ms
Lead Channel Sensing Intrinsic Amplitude: 0.625 mV
Lead Channel Sensing Intrinsic Amplitude: 0.625 mV
Lead Channel Sensing Intrinsic Amplitude: 8.875 mV
Lead Channel Sensing Intrinsic Amplitude: 8.875 mV
Lead Channel Setting Pacing Amplitude: 2 V
Lead Channel Setting Pacing Pulse Width: 0.4 ms
Lead Channel Setting Pacing Pulse Width: 0.5 ms
MDC IDC LEAD LOCATION: 753858
MDC IDC LEAD MODEL: 4196
MDC IDC MSMT LEADCHNL LV IMPEDANCE VALUE: 342 Ohm
MDC IDC MSMT LEADCHNL LV IMPEDANCE VALUE: 4047 Ohm
MDC IDC MSMT LEADCHNL RA IMPEDANCE VALUE: 380 Ohm
MDC IDC MSMT LEADCHNL RV PACING THRESHOLD AMPLITUDE: 0.5 V
MDC IDC SESS DTM: 20170228035116
MDC IDC SET LEADCHNL LV PACING AMPLITUDE: 3.25 V
MDC IDC SET LEADCHNL RV SENSING SENSITIVITY: 4 mV
MDC IDC STAT BRADY AP VS PERCENT: 0 %
MDC IDC STAT BRADY RV PERCENT PACED: 87.71 %

## 2015-10-28 ENCOUNTER — Ambulatory Visit (HOSPITAL_COMMUNITY)
Admission: RE | Admit: 2015-10-28 | Discharge: 2015-10-28 | Disposition: A | Discharge status: Home / Self Care | Payer: Medicare Other | Source: Ambulatory Visit | Attending: Cardiology | Admitting: Cardiology

## 2015-10-28 ENCOUNTER — Ambulatory Visit: Payer: Self-pay | Admitting: Cardiology

## 2015-10-28 ENCOUNTER — Ambulatory Visit (INDEPENDENT_AMBULATORY_CARE_PROVIDER_SITE_OTHER): Payer: Medicare Other | Admitting: Cardiology

## 2015-10-28 ENCOUNTER — Encounter: Payer: Self-pay | Admitting: Cardiology

## 2015-10-28 VITALS — BP 122/64 | HR 56 | Ht 64.0 in | Wt 130.0 lb

## 2015-10-28 DIAGNOSIS — I5022 Chronic systolic (congestive) heart failure: Secondary | ICD-10-CM | POA: Insufficient documentation

## 2015-10-28 DIAGNOSIS — I10 Essential (primary) hypertension: Secondary | ICD-10-CM

## 2015-10-28 DIAGNOSIS — I071 Rheumatic tricuspid insufficiency: Secondary | ICD-10-CM | POA: Insufficient documentation

## 2015-10-28 DIAGNOSIS — E119 Type 2 diabetes mellitus without complications: Secondary | ICD-10-CM | POA: Diagnosis not present

## 2015-10-28 DIAGNOSIS — I371 Nonrheumatic pulmonary valve insufficiency: Secondary | ICD-10-CM | POA: Diagnosis not present

## 2015-10-28 DIAGNOSIS — I4891 Unspecified atrial fibrillation: Secondary | ICD-10-CM | POA: Diagnosis not present

## 2015-10-28 DIAGNOSIS — I110 Hypertensive heart disease with heart failure: Secondary | ICD-10-CM | POA: Diagnosis not present

## 2015-10-28 DIAGNOSIS — J449 Chronic obstructive pulmonary disease, unspecified: Secondary | ICD-10-CM | POA: Insufficient documentation

## 2015-10-28 DIAGNOSIS — I351 Nonrheumatic aortic (valve) insufficiency: Secondary | ICD-10-CM | POA: Insufficient documentation

## 2015-10-28 DIAGNOSIS — I340 Nonrheumatic mitral (valve) insufficiency: Secondary | ICD-10-CM | POA: Diagnosis not present

## 2015-10-28 DIAGNOSIS — Z950 Presence of cardiac pacemaker: Secondary | ICD-10-CM

## 2015-10-28 DIAGNOSIS — I429 Cardiomyopathy, unspecified: Secondary | ICD-10-CM | POA: Insufficient documentation

## 2015-10-28 NOTE — Progress Notes (Signed)
Clinical Summary [name] is a 60 y.o.female seen today for follow up of the following medical problems.   1. NICM  - echo 10/2013 LVEF 30-35%, restrictive diastolic dysfunction - cath Jan 2011 showed no obstructive CAD  - denies any SOB or DOE. No recent swelling.  - weights at home stable at 136 lbs.    -  improved SOB. DOE at " a few blocks" now, better than before. No LE edema. Weights stable around 129 lbs. Limiting sodium intake. Avoiding NSAIDs.  - compliant with meds    2. Chronic afib  - prior AV nodal ablation with permanent pacemaker  - no recent palpitations since last visit. No bleeding troubles on xarelto   3. Moderate MR - stable from last echo, she denies any recent symptoms.   4. Pacemaker - she has a Medtronic BiV pacemaker followed by Dr Ladona Ridgel, LV lead replaced epicardial by Dr Laneta Simmers 01/2015 - normal device function by 09/30/15 check - no recent lightheadness or dizziness.   5. COPD - management per Dr Juanetta Gosling.   6. HTN - complaint with meds Past Medical History  Diagnosis Date  . Anxiety and depression   . Atrial fibrillation (HCC)     Echocardiogram in 2004-borderline LV function; no valvular abnormalities; 06/2012: AV nodal ablation + biventricular pacemaker  . Anemia     2011: Hemoglobin of 11.4; hematocrit of 33.5; normal MCV  . Cardiac arrest (HCC) 06/2012    16 day hospitalization; caused by hyperkalemia; multiple complications including CHF; permanent atrial fibrillation requiring AV nodal ablation + pacing  . Thyroid disease   . Nonischemic cardiomyopathy (HCC)     Echo 06/2012: EF 30-35%, moderate to severe MR; cardiac cath 06/2012:30-40% D1 and D2, estimated EF-35%, MR-not graded; right bundle Salim Forero block  . Dysrhythmia   . Presence of permanent cardiac pacemaker   . CHF (congestive heart failure) (HCC)   . Shortness of breath dyspnea   . Depression   . Anxiety   . Urinary frequency   . GERD (gastroesophageal reflux  disease)     protonix  . COPD (chronic obstructive pulmonary disease) (HCC)     heterozygous alpha-1 anti-trypsin deficiency     No Known Allergies   Current Outpatient Prescriptions  Medication Sig Dispense Refill  . albuterol (PROVENTIL HFA;VENTOLIN HFA) 108 (90 BASE) MCG/ACT inhaler Inhale 2 puffs into the lungs every 6 (six) hours as needed for wheezing or shortness of breath. 1 Inhaler 2  . ALPRAZolam (XANAX) 0.25 MG tablet Take 0.25 mg by mouth 2 (two) times daily as needed for anxiety.    . calcium carbonate (TUMS - DOSED IN MG ELEMENTAL CALCIUM) 500 MG chewable tablet Chew 2 tablets by mouth 3 (three) times daily.    . carvedilol (COREG) 12.5 MG tablet Take 1 tablet (12.5 mg total) by mouth 2 (two) times daily with a meal. 60 tablet 3  . furosemide (LASIX) 20 MG tablet Take 20 mg daily IF weight is above 133 lbs that day, take 40 mg that day 90 tablet 3  . lisinopril (PRINIVIL,ZESTRIL) 5 MG tablet Take 1 tablet (5 mg total) by mouth daily. 30 tablet 6  . magnesium oxide (MAG-OX) 400 MG tablet TAKE 2 TIMES DAILY UNTIL 01/06/15 THEN TAKE 1 TAB DAILY 38 tablet 3  . oxyCODONE (OXY IR/ROXICODONE) 5 MG immediate release tablet Take 1-2 tablets (5-10 mg total) by mouth every 4 (four) hours as needed for severe pain. 30 tablet 0  . pantoprazole (PROTONIX)  40 MG tablet Take 40 mg by mouth daily.    . sertraline (ZOLOFT) 50 MG tablet Take 50 mg by mouth daily.    Marland Kitchen tiotropium (SPIRIVA HANDIHALER) 18 MCG inhalation capsule Place 1 capsule (18 mcg total) into inhaler and inhale daily. 30 capsule 3  . XARELTO 20 MG TABS tablet TAKE ONE TABLET BY MOUTH ONCE DAILY 30 tablet 6  . zolpidem (AMBIEN) 5 MG tablet Take 5 mg by mouth at bedtime. For sleep     No current facility-administered medications for this visit.     Past Surgical History  Procedure Laterality Date  . Breast biopsy      left, [facility]  . Cervical conization w/bx  08/11/2011    Procedure: CONIZATION CERVIX WITH BIOPSY;  Surgeon:  Lazaro Arms, MD;  Location: [facility];  Service: Gynecology;  Laterality: N/A;  Laser Conization of Cervix  . Breast excisional biopsy      Left x2  . Tee without cardioversion  06/19/2012    Procedure: TRANSESOPHAGEAL ECHOCARDIOGRAM (TEE);  Surgeon: Vesta Mixer, MD;  Location: [facility] ENDOSCOPY;  Service: Cardiovascular;  Laterality: N/A;  . Cardioversion  06/19/2012    Procedure: CARDIOVERSION;  Surgeon: Vesta Mixer, MD;  Location: [facility] ENDOSCOPY;  Service: Cardiovascular;;  . Cardioversion  06/21/2012    Procedure: CARDIOVERSION;  Surgeon: Cassell Clement, MD;  Location: [facility] OR;  Service: Cardiovascular;  Laterality: N/A;  . Left heart catheterization with coronary angiogram N/A 06/15/2012    Procedure: LEFT HEART CATHETERIZATION WITH CORONARY ANGIOGRAM;  Surgeon: Herby Abraham, MD;  Location: [facility] CATH LAB;  Service: Cardiovascular;  Laterality: N/A;  . Permanent pacemaker insertion N/A 06/21/2012    Procedure: PERMANENT PACEMAKER INSERTION;  Surgeon: Marinus Maw, MD;  Location: [facility] CATH LAB;  Service: Cardiovascular;  Laterality: N/A;  . Av node ablation N/A 06/21/2012    Procedure: AV NODE ABLATION;  Surgeon: Marinus Maw, MD;  Location: [facility] CATH LAB;  Service: Cardiovascular;  Laterality: N/A;  . Av node ablation N/A 06/22/2012    Procedure: AV NODE ABLATION;  Surgeon: Duke Salvia, MD;  Location: [facility] CATH LAB;  Service: Cardiovascular;  Laterality: N/A;  . Insert / replace / remove pacemaker    . Thoracotomy Left 02/28/2015    Procedure: THORACOTOMY MAJOR;  Surgeon: Alleen Borne, MD;  Location: [facility] OR;  Service: Thoracic;  Laterality: Left;  . Epicardial pacing lead placement N/A 02/28/2015    Procedure: LV EPICARDIAL PACING LEAD PLACEMENT;  Surgeon: Alleen Borne, MD;  Location: [facility] OR;  Service: Thoracic;  Laterality: N/A;     No Known Allergies    Family History  Problem Relation Age of Onset  . Anesthesia problems Neg Hx   . Hypotension Neg Hx   . Malignant  hyperthermia Neg Hx   . Pseudochol deficiency Neg Hx   . Cancer Father     carcinoma of the lung  . Cancer Sister     carcinoma of the lung  . Colon cancer Neg Hx   . Cervical cancer Mother   . Irritable bowel syndrome      mother, sister  . Celiac disease Neg Hx   . Inflammatory bowel disease Neg Hx      Social History Ms. Birt reports that she has never smoked. She has never used smokeless tobacco. Ms. Gillean reports that she does not drink alcohol.   Review of Systems CONSTITUTIONAL: No weight loss, fever, chills, weakness or fatigue.  HEENT: Eyes: No visual  loss, blurred vision, double vision or yellow sclerae.No hearing loss, sneezing, congestion, runny nose or sore throat.  SKIN: No rash or itching.  CARDIOVASCULAR: per HPI RESPIRATORY: No shortness of breath, cough or sputum.  GASTROINTESTINAL: No anorexia, nausea, vomiting or diarrhea. No abdominal pain or blood.  GENITOURINARY: No burning on urination, no polyuria NEUROLOGICAL: No headache, dizziness, syncope, paralysis, ataxia, numbness or tingling in the extremities. No change in bowel or bladder control.  MUSCULOSKELETAL: No muscle, back pain, joint pain or stiffness.  LYMPHATICS: No enlarged nodes. No history of splenectomy.  PSYCHIATRIC: No history of depression or anxiety.  ENDOCRINOLOGIC: No reports of sweating, cold or heat intolerance. No polyuria or polydipsia.  Marland Kitchen   Physical Examination Filed Vitals:   10/28/15 1135  BP: 122/64  Pulse: 56   Filed Vitals:   10/28/15 1135  Height:  (1.626 m)  Weight: 130 lb (58.968 kg)    Gen: resting comfortably, no acute distress HEENT: no scleral icterus, pupils equal round and reactive, no palptable cervical adenopathy,  CV: RRR, no m/r/g, no jvd Resp: Clear to auscultation bilaterally GI: abdomen is soft, non-tender, non-distended, normal bowel sounds, no hepatosplenomegaly MSK: extremities are warm, no edema.  Skin: warm, no rash Neuro:  no focal  deficits Psych: appropriate affect   Diagnostic Studies 06/2012 TEE: mod to severe MR, milld to mod TR   06/12/12 Cath  Hemodynamics:  AO 118/68 (83)  LV 119/8  Cannot calculate gradient due to rapid atrial fib  Coronary angiography:  Coronary dominance: right  Left mainstem: Short without significant disease  Left anterior descending (LAD): There is calcification proximally. There are two major diagonal branches. Between the first and second diagonal branches are approximately 30-40% plaquing. The distal LAD has mild irregularity. Both diagonals have minimal irregularity.  Left circumflex (LCx): Provides one large bifurcation marginal Kathlyn Leachman and a small AV circumflex. There is minor distal irregularity. The AV circ is small  Right coronary artery (RCA): Provides a large PDA and a large and smaller PLA branches. No significant focal obstruction.  Left ventriculography: Due to ectopy, EF cannot be calculated. With AF, difficult to be sure of function but appears 35%. There does appear to be some MR.  Final Conclusions:  1. Atrial fib with rapid ventricular response.  2. Mild calcification of proximal LAD without critical obstruction. No high grade coronary artery disease.  09/2012 Echo: LVEF 25%, severely dilated LV, mild LVH, mild AI, mild MR   09/26/13 Clinic EKG  Afib, V-paced  11/08/13 Echo Study Conclusions  - Left ventricle: The cavity size was moderately dilated. Wall thickness was increased in a pattern of mild LVH. Systolic function was moderately to severely reduced. The estimated ejection fraction was in the range of 30% to 35%. Diffuse hypokinesis. There is severe hypokinesis of the anteroseptal myocardium. Doppler parameters are consistent with restrictive physiology, indicative of decreased left ventricular diastolic compliance and/or increased left atrial pressure. - Ventricular septum: Septal motion showed abnormal function and dyssynergy. -  Aortic valve: Trileaflet; mildly thickened leaflets. Mild to moderate regurgitation. - Mitral valve: Mildly thickened leaflets . Moderate regurgitation, appears to be made up of two jets. Suboptimal PISA. - Left atrium: The atrium was severely dilated. - Right ventricle: The cavity size was mildly dilated. Pacer wire or catheter noted in right ventricle. - Right atrium: The atrium was moderately to severely dilated. - Tricuspid valve: Mild-moderate regurgitation. - Pulmonic valve: Mild regurgitation. - Pulmonary arteries: Systolic pressure was moderately increased. - Pericardium, extracardiac: There  was no pericardial effusion. Impressions:  - Mild LVH with moderate chamber dilatation and LVEF 30-35% with diffuse hypokinesis, most prominent anteroseptal wall in setting of septal dyssynergy and pacing. Restrictive diastolic filling pattern with increased filling pressures. Severe left atrial and moderate to severe right atrial enlargement. MIldly thickened mitral valve with overall moderate mitral regurgitation made up of two jets (PISA suboptimal). Mild to moderate aortic regurgitation. Device wire in right heart. MIld to moderate tricuspid regurgitation with PASP 51 mmHg.       Assessment and Plan  1. NICM/Chronic systolic HF - LVEF 30-35% by echo 10/2013, NYHA 2.  - appears euvolemic in clinic, DOE is improving - we will repeat echo now that she has CRT device, is LVEF continues to be low will further titrate CHF meds.   2. Afib  - hx of av nodal ablation with pacemaker  - denies any symptoms - continue xarelto for stroke prevention   3. Mitral regurgitation  - moderate to severe by TEE 06/2012, follow up TTE's have been stable mild to moderate. No current symptoms, likely functional MR related to her LV systolic dysfunction.  - we will f/u repeat echo   4. Pacemaker - continue to follow with device clinic   5. COPD - followed by Dr Juanetta Gosling.   6.  HTN - at goal, continue current meds   F/u 3 months. Request pcp labs   [name], M.D.

## 2015-10-28 NOTE — Patient Instructions (Signed)
Medication Instructions:  Your physician recommends that you continue on your current medications as directed. Please refer to the Current Medication list given to you today.   Labwork: I will request labs from your PCP  Testing/Procedures:  none   Follow-Up: Your physician recommends that you schedule a follow-up appointment in: 3 months with Dr. Wyline Mood    Any Other Special Instructions Will Be Listed Below (If Applicable).     If you need a refill on your cardiac medications before your next appointment, please call your pharmacy.

## 2015-10-30 ENCOUNTER — Telehealth: Payer: Self-pay | Admitting: *Deleted

## 2015-10-30 NOTE — Telephone Encounter (Signed)
-----   Message from [name], MD sent at 10/29/2015  5:03 PM EDT ----- Echo shows heart function has improved since last check, it is now back to nearly normal.Continue current meds  Dominga Ferry MD

## 2015-10-30 NOTE — Telephone Encounter (Signed)
Pt aware, routed to pcp 

## 2015-11-20 ENCOUNTER — Other Ambulatory Visit: Payer: Self-pay | Admitting: Cardiology

## 2015-11-26 DIAGNOSIS — Z1211 Encounter for screening for malignant neoplasm of colon: Secondary | ICD-10-CM | POA: Diagnosis not present

## 2015-11-26 DIAGNOSIS — K219 Gastro-esophageal reflux disease without esophagitis: Secondary | ICD-10-CM | POA: Diagnosis not present

## 2015-11-26 DIAGNOSIS — I10 Essential (primary) hypertension: Secondary | ICD-10-CM | POA: Diagnosis not present

## 2015-11-26 DIAGNOSIS — J449 Chronic obstructive pulmonary disease, unspecified: Secondary | ICD-10-CM | POA: Diagnosis not present

## 2015-11-26 DIAGNOSIS — I509 Heart failure, unspecified: Secondary | ICD-10-CM | POA: Diagnosis not present

## 2015-11-26 DIAGNOSIS — F329 Major depressive disorder, single episode, unspecified: Secondary | ICD-10-CM | POA: Diagnosis not present

## 2015-11-26 DIAGNOSIS — M109 Gout, unspecified: Secondary | ICD-10-CM | POA: Diagnosis not present

## 2015-12-01 DIAGNOSIS — H2513 Age-related nuclear cataract, bilateral: Secondary | ICD-10-CM | POA: Diagnosis not present

## 2015-12-02 ENCOUNTER — Other Ambulatory Visit: Payer: Self-pay | Admitting: Cardiology

## 2015-12-04 ENCOUNTER — Encounter: Payer: Self-pay | Admitting: Internal Medicine

## 2015-12-04 ENCOUNTER — Ambulatory Visit (INDEPENDENT_AMBULATORY_CARE_PROVIDER_SITE_OTHER): Payer: Medicare Other | Admitting: Internal Medicine

## 2015-12-04 VITALS — BP 144/74 | HR 45 | Ht 64.0 in | Wt 135.0 lb

## 2015-12-04 DIAGNOSIS — I5022 Chronic systolic (congestive) heart failure: Secondary | ICD-10-CM

## 2015-12-04 DIAGNOSIS — Z950 Presence of cardiac pacemaker: Secondary | ICD-10-CM | POA: Diagnosis not present

## 2015-12-04 NOTE — Patient Instructions (Signed)
Your physician wants you to follow-up in: 1 Year with Dr. Taylor. You will receive a reminder letter in the mail two months in advance. If you don't receive a letter, please call our office to schedule the follow-up appointment.  Remote monitoring is used to monitor your Pacemaker of ICD from home. This monitoring reduces the number of office visits required to check your device to one time per year. It allows us to keep an eye on the functioning of your device to ensure it is working properly. You are scheduled for a device check from home on 03/04/16. You may send your transmission at any time that day. If you have a wireless device, the transmission will be sent automatically. After your physician reviews your transmission, you will receive a postcard with your next transmission date.   If you need a refill on your cardiac medications before your next appointment, please call your pharmacy.  Thank you for choosing [facility]!   

## 2015-12-04 NOTE — Progress Notes (Signed)
HPI Mrs. Snopek returns today for followup. She is a pleasant 60 yo woman with a non-ischemic CM, EF 30-35%, chronic atrial fib with an RVR, s/p AV node ablation and PPM insertion. She developed diaphragmatic stimulation and had to have her LV lead turned off and then developed worsening LV function from RV apical pacing and has undergone insertion of an LV epicardial lead. Her EF and her MR have improved since her surgery several months ago. She denies chest pain or sob. No syncope.    No Known Allergies   Current Outpatient Prescriptions  Medication Sig Dispense Refill  . albuterol (PROVENTIL HFA;VENTOLIN HFA) 108 (90 BASE) MCG/ACT inhaler Inhale 2 puffs into the lungs every 6 (six) hours as needed for wheezing or shortness of breath. 1 Inhaler 2  . ALPRAZolam (XANAX) 0.25 MG tablet Take 0.25 mg by mouth 2 (two) times daily as needed for anxiety.    . calcium carbonate (TUMS - DOSED IN MG ELEMENTAL CALCIUM) 500 MG chewable tablet Chew 2 tablets by mouth 3 (three) times daily.    . carvedilol (COREG) 12.5 MG tablet Take 1 tablet (12.5 mg total) by mouth 2 (two) times daily with a meal. 60 tablet 3  . furosemide (LASIX) 20 MG tablet Take 20 mg daily IF weight is above 133 lbs that day, take 40 mg that day 90 tablet 3  . lisinopril (PRINIVIL,ZESTRIL) 5 MG tablet TAKE ONE TABLET BY MOUTH ONCE DAILY 30 tablet 6  . magnesium oxide (MAG-OX) 400 MG tablet TAKE 2 TIMES DAILY UNTIL 01/06/15 THEN TAKE 1 TAB DAILY 38 tablet 3  . oxyCODONE (OXY IR/ROXICODONE) 5 MG immediate release tablet Take 1-2 tablets (5-10 mg total) by mouth every 4 (four) hours as needed for severe pain. 30 tablet 0  . pantoprazole (PROTONIX) 40 MG tablet Take 40 mg by mouth daily.    . sertraline (ZOLOFT) 50 MG tablet Take 50 mg by mouth daily.    Marland Kitchen tiotropium (SPIRIVA HANDIHALER) 18 MCG inhalation capsule Place 1 capsule (18 mcg total) into inhaler and inhale daily. 30 capsule 3  . XARELTO 20 MG TABS tablet TAKE ONE TABLET BY MOUTH ONCE  DAILY 30 tablet 3  . zolpidem (AMBIEN) 5 MG tablet Take 5 mg by mouth at bedtime. For sleep     No current facility-administered medications for this visit.     Past Medical History  Diagnosis Date  . Anxiety and depression   . Atrial fibrillation (HCC)     Echocardiogram in 2004-borderline LV function; no valvular abnormalities; 06/2012: AV nodal ablation + biventricular pacemaker  . Anemia     2011: Hemoglobin of 11.4; hematocrit of 33.5; normal MCV  . Cardiac arrest (HCC) 06/2012    16 day hospitalization; caused by hyperkalemia; multiple complications including CHF; permanent atrial fibrillation requiring AV nodal ablation + pacing  . Thyroid disease   . Nonischemic cardiomyopathy (HCC)     Echo 06/2012: EF 30-35%, moderate to severe MR; cardiac cath 06/2012:30-40% D1 and D2, estimated EF-35%, MR-not graded; right bundle branch block  . Dysrhythmia   . Presence of permanent cardiac pacemaker   . CHF (congestive heart failure) (HCC)   . Shortness of breath dyspnea   . Depression   . Anxiety   . Urinary frequency   . GERD (gastroesophageal reflux disease)     protonix  . COPD (chronic obstructive pulmonary disease) (HCC)     heterozygous alpha-1 anti-trypsin deficiency    ROS:   All systems reviewed and negative except  as noted in the HPI.   Past Surgical History  Procedure Laterality Date  . Breast biopsy      left, [facility]  . Cervical conization w/bx  08/11/2011    Procedure: CONIZATION CERVIX WITH BIOPSY;  Surgeon: Lazaro Arms, MD;  Location: [facility];  Service: Gynecology;  Laterality: N/A;  Laser Conization of Cervix  . Breast excisional biopsy      Left x2  . Tee without cardioversion  06/19/2012    Procedure: TRANSESOPHAGEAL ECHOCARDIOGRAM (TEE);  Surgeon: Vesta Mixer, MD;  Location: [facility] ENDOSCOPY;  Service: Cardiovascular;  Laterality: N/A;  . Cardioversion  06/19/2012    Procedure: CARDIOVERSION;  Surgeon: Vesta Mixer, MD;  Location: [facility] ENDOSCOPY;  Service:  Cardiovascular;;  . Cardioversion  06/21/2012    Procedure: CARDIOVERSION;  Surgeon: Cassell Clement, MD;  Location: [facility] OR;  Service: Cardiovascular;  Laterality: N/A;  . Left heart catheterization with coronary angiogram N/A 06/15/2012    Procedure: LEFT HEART CATHETERIZATION WITH CORONARY ANGIOGRAM;  Surgeon: Herby Abraham, MD;  Location: [facility] CATH LAB;  Service: Cardiovascular;  Laterality: N/A;  . Permanent pacemaker insertion N/A 06/21/2012    Procedure: PERMANENT PACEMAKER INSERTION;  Surgeon: Marinus Maw, MD;  Location: [facility] CATH LAB;  Service: Cardiovascular;  Laterality: N/A;  . Av node ablation N/A 06/21/2012    Procedure: AV NODE ABLATION;  Surgeon: Marinus Maw, MD;  Location: [facility] CATH LAB;  Service: Cardiovascular;  Laterality: N/A;  . Av node ablation N/A 06/22/2012    Procedure: AV NODE ABLATION;  Surgeon: Duke Salvia, MD;  Location: [facility] CATH LAB;  Service: Cardiovascular;  Laterality: N/A;  . Insert / replace / remove pacemaker    . Thoracotomy Left 02/28/2015    Procedure: THORACOTOMY MAJOR;  Surgeon: Alleen Borne, MD;  Location: [facility] OR;  Service: Thoracic;  Laterality: Left;  . Epicardial pacing lead placement N/A 02/28/2015    Procedure: LV EPICARDIAL PACING LEAD PLACEMENT;  Surgeon: Alleen Borne, MD;  Location: [facility] OR;  Service: Thoracic;  Laterality: N/A;     Family History  Problem Relation Age of Onset  . Anesthesia problems Neg Hx   . Hypotension Neg Hx   . Malignant hyperthermia Neg Hx   . Pseudochol deficiency Neg Hx   . Cancer Father     carcinoma of the lung  . Cancer Sister     carcinoma of the lung  . Colon cancer Neg Hx   . Cervical cancer Mother   . Irritable bowel syndrome      mother, sister  . Celiac disease Neg Hx   . Inflammatory bowel disease Neg Hx      Social History   Social History  . Marital Status: Divorced    Spouse Name: N/A  . Number of Children: 2  . Years of Education: N/A   Occupational History  . Financial controller  . 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 Smoking status: Never Smoker   . Smokeless tobacco: Never Used  . Alcohol Use: No  . Drug Use: No  . Sexual Activity: Not on file   Other Topics Concern  . Not on file   Social History Narrative   ** Merged History Encounter **       ** Data from: 06/22/12 Enc Dept: [facility]       ** Data from: 06/05/12 Enc Dept: [facility]   Lives  alone with good family support from sisters.            BP 144/74 mmHg  Pulse 45  Ht  (1.626 m)  Wt 135 lb (61.236 kg)  BMI 23.16 kg/m2  Physical Exam:  Well appearing middle aged woman, NAD HEENT: Unremarkable Neck:  7 cm JVD, no thyromegally Back:  No CVA tenderness Lungs:  Clear with no wheezes or rhonchi. Minimal rales. Well healed PPM insertion. HEART:  Regular rate rhythm, no murmurs, no rubs, no clicks Abd:  soft, positive bowel sound,s, no organomegally, no rebound, no guarding Ext:  2 plus pulses, no edema, no cyanosis, no clubbing Skin:  No rashes no nodules Neuro:  CN II through XII intact, motor grossly intact  DEVICE  Normal device function.  See PaceArt for details.  Assess/Plan: 1. Atrial fib - her ventricular rate is well controlled. No change in meds. 2. Chronic systolic heart failure - by echo 2 months ago, her EF had improved after epicardial PM leads were placed on the LV. 3. MR - by echo, her MR is only mild/moderate. She will undergo watchful waiting 4. BiV PPM - her [employer] BiV PM is working normally. She is s/p epicardial LV lead insertion.  [name] [name].D.

## 2015-12-09 DIAGNOSIS — Z1211 Encounter for screening for malignant neoplasm of colon: Secondary | ICD-10-CM | POA: Diagnosis not present

## 2015-12-09 DIAGNOSIS — K621 Rectal polyp: Secondary | ICD-10-CM | POA: Diagnosis not present

## 2015-12-22 LAB — CUP PACEART INCLINIC DEVICE CHECK
Brady Statistic AP VS Percent: 0 %
Brady Statistic AS VP Percent: 90.77 %
Brady Statistic RV Percent Paced: 90.77 %
Date Time Interrogation Session: 20170505014044
Implantable Lead Implant Date: 20131120
Implantable Lead Implant Date: 20131120
Implantable Lead Location: 753858
Implantable Lead Location: 753859
Implantable Lead Model: 5076
Implantable Lead Model: 5076
Lead Channel Impedance Value: 323 Ohm
Lead Channel Impedance Value: 4047 Ohm
Lead Channel Impedance Value: 456 Ohm
Lead Channel Impedance Value: 475 Ohm
Lead Channel Pacing Threshold Pulse Width: 1 ms
Lead Channel Sensing Intrinsic Amplitude: 0.375 mV
Lead Channel Sensing Intrinsic Amplitude: 9 mV
Lead Channel Setting Pacing Pulse Width: 0.4 ms
Lead Channel Setting Sensing Sensitivity: 4 mV
MDC IDC LEAD IMPLANT DT: 20131120
MDC IDC LEAD LOCATION: 753860
MDC IDC LEAD MODEL: 4196
MDC IDC MSMT BATTERY REMAINING LONGEVITY: 31 mo
MDC IDC MSMT BATTERY VOLTAGE: 2.97 V
MDC IDC MSMT LEADCHNL LV IMPEDANCE VALUE: 4047 Ohm
MDC IDC MSMT LEADCHNL LV IMPEDANCE VALUE: 4047 Ohm
MDC IDC MSMT LEADCHNL LV IMPEDANCE VALUE: 513 Ohm
MDC IDC MSMT LEADCHNL LV PACING THRESHOLD AMPLITUDE: 2.25 V
MDC IDC MSMT LEADCHNL RA IMPEDANCE VALUE: 342 Ohm
MDC IDC MSMT LEADCHNL RV IMPEDANCE VALUE: 627 Ohm
MDC IDC MSMT LEADCHNL RV PACING THRESHOLD AMPLITUDE: 0.75 V
MDC IDC MSMT LEADCHNL RV PACING THRESHOLD PULSEWIDTH: 0.4 ms
MDC IDC SET LEADCHNL LV PACING AMPLITUDE: 3.25 V
MDC IDC SET LEADCHNL LV PACING PULSEWIDTH: 1 ms
MDC IDC SET LEADCHNL RV PACING AMPLITUDE: 2 V
MDC IDC STAT BRADY AP VP PERCENT: 0 %
MDC IDC STAT BRADY AS VS PERCENT: 9.23 %
MDC IDC STAT BRADY RA PERCENT PACED: 0 %

## 2016-01-02 ENCOUNTER — Other Ambulatory Visit: Payer: Self-pay | Admitting: Cardiovascular Disease

## 2016-01-05 NOTE — Telephone Encounter (Signed)
Please advise 

## 2016-01-28 ENCOUNTER — Ambulatory Visit: Payer: Self-pay | Admitting: Cardiology

## 2016-03-04 ENCOUNTER — Ambulatory Visit (INDEPENDENT_AMBULATORY_CARE_PROVIDER_SITE_OTHER): Payer: Medicare Other | Admitting: *Deleted

## 2016-03-04 DIAGNOSIS — I429 Cardiomyopathy, unspecified: Secondary | ICD-10-CM

## 2016-03-04 DIAGNOSIS — I482 Chronic atrial fibrillation: Secondary | ICD-10-CM | POA: Diagnosis not present

## 2016-03-04 DIAGNOSIS — Z950 Presence of cardiac pacemaker: Secondary | ICD-10-CM

## 2016-03-04 DIAGNOSIS — E118 Type 2 diabetes mellitus with unspecified complications: Secondary | ICD-10-CM | POA: Diagnosis not present

## 2016-03-04 DIAGNOSIS — J449 Chronic obstructive pulmonary disease, unspecified: Secondary | ICD-10-CM | POA: Diagnosis not present

## 2016-03-04 DIAGNOSIS — I5022 Chronic systolic (congestive) heart failure: Secondary | ICD-10-CM | POA: Diagnosis not present

## 2016-03-05 ENCOUNTER — Telehealth: Payer: Self-pay | Admitting: Cardiology

## 2016-03-05 ENCOUNTER — Encounter: Payer: Self-pay | Admitting: Cardiology

## 2016-03-05 ENCOUNTER — Ambulatory Visit (INDEPENDENT_AMBULATORY_CARE_PROVIDER_SITE_OTHER): Payer: Medicare Other | Admitting: Cardiology

## 2016-03-05 ENCOUNTER — Encounter: Payer: Self-pay | Admitting: *Deleted

## 2016-03-05 VITALS — BP 122/70 | HR 49 | Ht 64.0 in | Wt 136.0 lb

## 2016-03-05 DIAGNOSIS — I429 Cardiomyopathy, unspecified: Secondary | ICD-10-CM

## 2016-03-05 DIAGNOSIS — I10 Essential (primary) hypertension: Secondary | ICD-10-CM | POA: Diagnosis not present

## 2016-03-05 DIAGNOSIS — I5022 Chronic systolic (congestive) heart failure: Secondary | ICD-10-CM

## 2016-03-05 DIAGNOSIS — I4891 Unspecified atrial fibrillation: Secondary | ICD-10-CM

## 2016-03-05 DIAGNOSIS — I340 Nonrheumatic mitral (valve) insufficiency: Secondary | ICD-10-CM

## 2016-03-05 DIAGNOSIS — I428 Other cardiomyopathies: Secondary | ICD-10-CM

## 2016-03-05 DIAGNOSIS — I482 Chronic atrial fibrillation: Secondary | ICD-10-CM | POA: Diagnosis not present

## 2016-03-05 DIAGNOSIS — E118 Type 2 diabetes mellitus with unspecified complications: Secondary | ICD-10-CM | POA: Diagnosis not present

## 2016-03-05 DIAGNOSIS — J449 Chronic obstructive pulmonary disease, unspecified: Secondary | ICD-10-CM | POA: Diagnosis not present

## 2016-03-05 NOTE — Progress Notes (Addendum)
Clinical Summary [name] [name] is a 60 y.o.female seen today for follow up of the following medical problems.   1. NICM  - echo 10/2013 LVEF 30-35%, restrictive diastolic dysfunction - cath Jan 2011 showed no obstructive CAD  - repeat echo 10/2015 LVEF 45-50%.    - no significant SOB or DOE. No LE edema.  - compliant with meds. Limiting salt sodium intake.   2. Chronic afib  - prior AV nodal ablation with permanent pacemaker   - just occasional rare palpitations.  - no bleeding troubles on xarelto.   3. Moderate MR - stable from last echo - no recent symptoms  4. Pacemaker - she has a Medtronic BiV pacemaker followed by Dr Ladona Ridgel, LV lead replaced epicardial by Dr Laneta Simmers 01/2015 - normal device function by 5/17 check - no recent lightheadness or dizziness.   5. COPD - management per Dr Juanetta Gosling.   6. HTN - complaint with meds Past Medical History:  Diagnosis Date  . Anemia    2011: Hemoglobin of 11.4; hematocrit of 33.5; normal MCV  . Anxiety   . Anxiety and depression   . Atrial fibrillation (HCC)    Echocardiogram in 2004-borderline LV function; no valvular abnormalities; 06/2012: AV nodal ablation + biventricular pacemaker  . Cardiac arrest (HCC) 06/2012   16 day hospitalization; caused by hyperkalemia; multiple complications including CHF; permanent atrial fibrillation requiring AV nodal ablation + pacing  . CHF (congestive heart failure) (HCC)   . COPD (chronic obstructive pulmonary disease) (HCC)    heterozygous alpha-1 anti-trypsin deficiency  . Depression   . Dysrhythmia   . GERD (gastroesophageal reflux disease)    protonix  . Nonischemic cardiomyopathy (HCC)    Echo 06/2012: EF 30-35%, moderate to severe MR; cardiac cath 06/2012:30-40% D1 and D2, estimated EF-35%, MR-not graded; right bundle [name] [name] block  . Presence of permanent cardiac pacemaker   . Shortness of breath dyspnea   . Thyroid disease   . Urinary frequency      No Known  Allergies   Current Outpatient Prescriptions  Medication Sig Dispense Refill  . albuterol (PROVENTIL HFA;VENTOLIN HFA) 108 (90 BASE) MCG/ACT inhaler Inhale 2 puffs into the lungs every 6 (six) hours as needed for wheezing or shortness of breath. 1 Inhaler 2  . ALPRAZolam (XANAX) 0.25 MG tablet Take 0.25 mg by mouth 2 (two) times daily as needed for anxiety.    . calcium carbonate (TUMS - DOSED IN MG ELEMENTAL CALCIUM) 500 MG chewable tablet Chew 2 tablets by mouth 3 (three) times daily.    . carvedilol (COREG) 12.5 MG tablet TAKE ONE TABLET BY MOUTH TWICE DAILY WITH A MEAL 60 tablet 6  . furosemide (LASIX) 20 MG tablet Take 20 mg daily IF weight is above 133 lbs that day, take 40 mg that day 90 tablet 3  . lisinopril (PRINIVIL,ZESTRIL) 5 MG tablet TAKE ONE TABLET BY MOUTH ONCE DAILY 30 tablet 6  . magnesium oxide (MAG-OX) 400 MG tablet TAKE 2 TIMES DAILY UNTIL 01/06/15 THEN TAKE 1 TAB DAILY 38 tablet 3  . oxyCODONE (OXY IR/ROXICODONE) 5 MG immediate release tablet Take 1-2 tablets (5-10 mg total) by mouth every 4 (four) hours as needed for severe pain. 30 tablet 0  . pantoprazole (PROTONIX) 40 MG tablet Take 40 mg by mouth daily.    . sertraline (ZOLOFT) 50 MG tablet Take 50 mg by mouth daily.    Marland Kitchen tiotropium (SPIRIVA HANDIHALER) 18 MCG inhalation capsule Place 1 capsule (18 mcg total)  into inhaler and inhale daily. 30 capsule 3  . XARELTO 20 MG TABS tablet TAKE ONE TABLET BY MOUTH ONCE DAILY 30 tablet 3  . zolpidem (AMBIEN) 5 MG tablet Take 5 mg by mouth at bedtime. For sleep     No current facility-administered medications for this visit.      Past Surgical History:  Procedure Laterality Date  . AV NODE ABLATION N/A 06/21/2012   Procedure: AV NODE ABLATION;  Surgeon: Marinus Maw, MD;  Location[facility] CATH LAB;  Service: Cardiovascular;  Laterality: N/A;  . AV NODE ABLATION N/A 06/22/2012   Procedure: AV NODE ABLATION;  Surgeon: Duke Salvia, MD;  Location: [facility] CATH LAB;  Service:  Cardiovascular;  Laterality: N/A;  . BREAST BIOPSY     left, [facility]  . BREAST EXCISIONAL BIOPSY     Left x2  . CARDIOVERSION  06/19/2012   Procedure: CARDIOVERSION;  Surgeon: Vesta Mixer, MD;  Location: [facility] ENDOSCOPY;  Service: Cardiovascular;;  . CARDIOVERSION  06/21/2012   Procedure: CARDIOVERSION;  Surgeon: Cassell Clement, MD;  Location: [facility] OR;  Service: Cardiovascular;  Laterality: N/A;  . CERVICAL CONIZATION W/BX  08/11/2011   Procedure: CONIZATION CERVIX WITH BIOPSY;  Surgeon: Lazaro Arms, MD;  Location: [facility] ORS;  Service: Gynecology;  Laterality: N/A;  Laser Conization of Cervix  . EPICARDIAL PACING LEAD PLACEMENT N/A 02/28/2015   Procedure: LV EPICARDIAL PACING LEAD PLACEMENT;  Surgeon: Alleen Borne, MD;  Location: [facility] OR;  Service: Thoracic;  Laterality: N/A;  . INSERT / REPLACE / REMOVE PACEMAKER    . LEFT HEART CATHETERIZATION WITH CORONARY ANGIOGRAM N/A 06/15/2012   Procedure: LEFT HEART CATHETERIZATION WITH CORONARY ANGIOGRAM;  Surgeon: Herby Abraham, MD;  Location: [facility] CATH LAB;  Service: Cardiovascular;  Laterality: N/A;  . PERMANENT PACEMAKER INSERTION N/A 06/21/2012   Procedure: PERMANENT PACEMAKER INSERTION;  Surgeon: Marinus Maw, MD;  Location: [facility] CATH LAB;  Service: Cardiovascular;  Laterality: N/A;  . TEE WITHOUT CARDIOVERSION  06/19/2012   Procedure: TRANSESOPHAGEAL ECHOCARDIOGRAM (TEE);  Surgeon: Vesta Mixer, MD;  Location: [facility] ENDOSCOPY;  Service: Cardiovascular;  Laterality: N/A;  . THORACOTOMY Left 02/28/2015   Procedure: THORACOTOMY MAJOR;  Surgeon: Alleen Borne, MD;  Location: [facility] OR;  Service: Thoracic;  Laterality: Left;     No Known Allergies    Family History  Problem Relation Age of Onset  . Anesthesia problems Neg Hx   . Hypotension Neg Hx   . Malignant hyperthermia Neg Hx   . Pseudochol deficiency Neg Hx   . Cancer Father     carcinoma of the lung  . Cancer Sister     carcinoma of the lung  . Colon cancer Neg Hx   . Cervical cancer  Mother   . Irritable bowel syndrome      mother, sister  . Celiac disease Neg Hx   . Inflammatory bowel disease Neg Hx      Social History Ms. Fetherolf reports that she has never smoked. She has never used smokeless tobacco. Ms. Walder reports that she does not drink alcohol.   Review of Systems CONSTITUTIONAL: No weight loss, fever, chills, weakness or fatigue.  HEENT: Eyes: No visual loss, blurred vision, double vision or yellow sclerae.No hearing loss, sneezing, congestion, runny nose or sore throat.  SKIN: No rash or itching.  CARDIOVASCULAR: per HPI RESPIRATORY: No shortness of breath, cough or sputum.  GASTROINTESTINAL: No anorexia, nausea, vomiting or diarrhea. No abdominal pain or blood.  GENITOURINARY: No burning  on urination, no polyuria NEUROLOGICAL: No headache, dizziness, syncope, paralysis, ataxia, numbness or tingling in the extremities. No change in bowel or bladder control.  MUSCULOSKELETAL: No muscle, back pain, joint pain or stiffness.  LYMPHATICS: No enlarged nodes. No history of splenectomy.  PSYCHIATRIC: No history of depression or anxiety.  ENDOCRINOLOGIC: No reports of sweating, cold or heat intolerance. No polyuria or polydipsia.  Marland Kitchen   Physical Examination Vitals:   03/05/16 1055  BP: 122/70  Pulse: (!) 49   Vitals:   03/05/16 1055  Weight: 136 lb (61.7 kg)  Height: 5\' 4"  (1.626 m)    Gen: resting comfortably, no acute distress HEENT: no scleral icterus, pupils equal round and reactive, no palptable cervical adenopathy,  CV: RRR, no m/r/g, no jvd Resp: Clear to auscultation bilaterally GI: abdomen is soft, non-tender, non-distended, normal bowel sounds, no hepatosplenomegaly MSK: extremities are warm, no edema.  Skin: warm, no rash Neuro:  no focal deficits Psych: appropriate affect   Diagnostic Studies 06/2012 TEE: mod to severe MR, milld to mod TR   06/12/12 Cath  Hemodynamics:  AO 118/68 (83)  LV 119/8  Cannot calculate gradient  due to rapid atrial fib  Coronary angiography:  Coronary dominance: right  Left mainstem: Short without significant disease  Left anterior descending (LAD): There is calcification proximally. There are two major diagonal branches. Between the first and second diagonal branches are approximately 30-40% plaquing. The distal LAD has mild irregularity. Both diagonals have minimal irregularity.  Left circumflex (LCx): Provides one large bifurcation marginal [name] [name] and a small AV circumflex. There is minor distal irregularity. The AV circ is small  Right coronary artery (RCA): Provides a large PDA and a large and smaller PLA branches. No significant focal obstruction.  Left ventriculography: Due to ectopy, EF cannot be calculated. With AF, difficult to be sure of function but appears 35%. There does appear to be some MR.  Final Conclusions:  1. Atrial fib with rapid ventricular response.  2. Mild calcification of proximal LAD without critical obstruction. No high grade coronary artery disease.  09/2012 Echo: LVEF 25%, severely dilated LV, mild LVH, mild AI, mild MR   09/26/13 Clinic EKG  Afib, V-paced  11/08/13 Echo Study Conclusions  - Left ventricle: The cavity size was moderately dilated. Wall thickness was increased in a pattern of mild LVH. Systolic function was moderately to severely reduced. The estimated ejection fraction was in the range of 30% to 35%. Diffuse hypokinesis. There is severe hypokinesis of the anteroseptal myocardium. Doppler parameters are consistent with restrictive physiology, indicative of decreased left ventricular diastolic compliance and/or increased left atrial pressure. - Ventricular septum: Septal motion showed abnormal function and dyssynergy. - Aortic valve: Trileaflet; mildly thickened leaflets. Mild to moderate regurgitation. - Mitral valve: Mildly thickened leaflets . Moderate regurgitation, appears to be made up of two jets. Suboptimal  PISA. - Left atrium: The atrium was severely dilated. - Right ventricle: The cavity size was mildly dilated. Pacer wire or catheter noted in right ventricle. - Right atrium: The atrium was moderately to severely dilated. - Tricuspid valve: Mild-moderate regurgitation. - Pulmonic valve: Mild regurgitation. - Pulmonary arteries: Systolic pressure was moderately increased. - Pericardium, extracardiac: There was no pericardial effusion. Impressions:  - Mild LVH with moderate chamber dilatation and LVEF 30-35% with diffuse hypokinesis, most prominent anteroseptal wall in setting of septal dyssynergy and pacing. Restrictive diastolic filling pattern with increased filling pressures. Severe left atrial and moderate to severe right atrial enlargement. MIldly thickened mitral valve with  overall moderate mitral regurgitation made up of two jets (PISA suboptimal). Mild to moderate aortic regurgitation. Device wire in right heart. MIld to moderate tricuspid regurgitation with PASP 51 mmHg.   10/2015 echo Study Conclusions  - Left ventricle: The cavity size was normal. Systolic function was   mildly reduced. The estimated ejection fraction was approximately   45%. Diffuse hypokinesis. Diastolic dysfunction, indeterminate   grade and filling pressures. Mild septal thickening. - Aortic valve: Mildly to moderately calcified annulus. Trileaflet.   There was mild to moderate regurgitation. - Mitral valve: There was moderate regurgitation. - Left atrium: The atrium was severely dilated. - Right ventricle: Pacer wire or catheter noted in right ventricle. - Right atrium: The atrium was mildly to moderately dilated. - Tricuspid valve: There was mild regurgitation. - Pulmonic valve: There was mild regurgitation.    Assessment and Plan  1. NICM/Chronic systolic HF - echo 10/2015 showed LVEF improved to 45-50% - no current symptoms - continue current meds  2. Afib  - hx of av nodal  ablation with pacemaker  - denies any signficant symptoms - CHADS2Vasc score is 4, continue anticoag   3. Mitral regurgitation  - moderate to severe by TEE 06/2012, follow up TTE's have been stable mild to moderate. No current symptoms, likely functional MR related to her LV systolic dysfunction.  - we will cotinue to monitor   4. Pacemaker - continue to follow with device clinic - EKG in clinic today shows ventricular paced rhythym  5. COPD - followed by Dr Juanetta Gosling.   6. HTN - at goal, she will continue current meds   F/u 6 months. Request pcp labs      [name] [name], M.D.

## 2016-03-05 NOTE — Patient Instructions (Signed)
Your physician wants you to follow-up in: 6 MONTHS WITH DR. BRANCH You will receive a reminder letter in the mail two months in advance. If you don't receive a letter, please call our office to schedule the follow-up appointment.  Your physician recommends that you continue on your current medications as directed. Please refer to the Current Medication list given to you today.  WE WILL RESCHEDULE YOUR REMOTE TRANSMISSION   Thank you for choosing [facility]!!

## 2016-03-05 NOTE — Telephone Encounter (Signed)
Spoke with pt and reminded pt of remote transmission that is due today. Pt verbalized understanding.   

## 2016-03-11 ENCOUNTER — Encounter: Payer: Self-pay | Admitting: Cardiology

## 2016-03-11 NOTE — Progress Notes (Signed)
Letter  

## 2016-03-11 NOTE — Progress Notes (Signed)
Remote pacemaker transmission.   

## 2016-03-29 LAB — CUP PACEART REMOTE DEVICE CHECK
Battery Remaining Longevity: 29 mo
Battery Voltage: 2.97 V
Brady Statistic AP VS Percent: 0 %
Brady Statistic RA Percent Paced: 0 %
Brady Statistic RV Percent Paced: 94.05 %
Date Time Interrogation Session: 20170805011953
Implantable Lead Implant Date: 20131120
Implantable Lead Implant Date: 20131120
Implantable Lead Implant Date: 20131120
Implantable Lead Location: 753858
Implantable Lead Location: 753859
Implantable Lead Location: 753860
Implantable Lead Model: 4196
Lead Channel Impedance Value: 380 Ohm
Lead Channel Impedance Value: 4047 Ohm
Lead Channel Impedance Value: 551 Ohm
Lead Channel Pacing Threshold Amplitude: 0.5 V
Lead Channel Pacing Threshold Amplitude: 1.875 V
Lead Channel Pacing Threshold Pulse Width: 0.4 ms
Lead Channel Sensing Intrinsic Amplitude: 0.625 mV
Lead Channel Setting Pacing Amplitude: 2 V
Lead Channel Setting Pacing Amplitude: 3 V
Lead Channel Setting Sensing Sensitivity: 4 mV
MDC IDC MSMT LEADCHNL LV IMPEDANCE VALUE: 361 Ohm
MDC IDC MSMT LEADCHNL LV IMPEDANCE VALUE: 4047 Ohm
MDC IDC MSMT LEADCHNL LV IMPEDANCE VALUE: 4047 Ohm
MDC IDC MSMT LEADCHNL LV PACING THRESHOLD PULSEWIDTH: 1 ms
MDC IDC MSMT LEADCHNL RA IMPEDANCE VALUE: 494 Ohm
MDC IDC MSMT LEADCHNL RA SENSING INTR AMPL: 0.625 mV
MDC IDC MSMT LEADCHNL RV IMPEDANCE VALUE: 513 Ohm
MDC IDC MSMT LEADCHNL RV IMPEDANCE VALUE: 665 Ohm
MDC IDC MSMT LEADCHNL RV SENSING INTR AMPL: 14 mV
MDC IDC MSMT LEADCHNL RV SENSING INTR AMPL: 14 mV
MDC IDC SET LEADCHNL LV PACING PULSEWIDTH: 1 ms
MDC IDC SET LEADCHNL RV PACING PULSEWIDTH: 0.4 ms
MDC IDC STAT BRADY AP VP PERCENT: 0 %
MDC IDC STAT BRADY AS VP PERCENT: 94.05 %
MDC IDC STAT BRADY AS VS PERCENT: 5.95 %

## 2016-04-06 ENCOUNTER — Other Ambulatory Visit: Payer: Self-pay | Admitting: Cardiology

## 2016-05-07 IMAGING — DX DG CHEST 2V
2 series · 2 of 2 positions shown · non-contrast
Comparison: 03/03/2015 .

CLINICAL DATA: Shortness of breath.  Thoracotomy.

EXAM:
CHEST  2 VIEW

[chest pa]
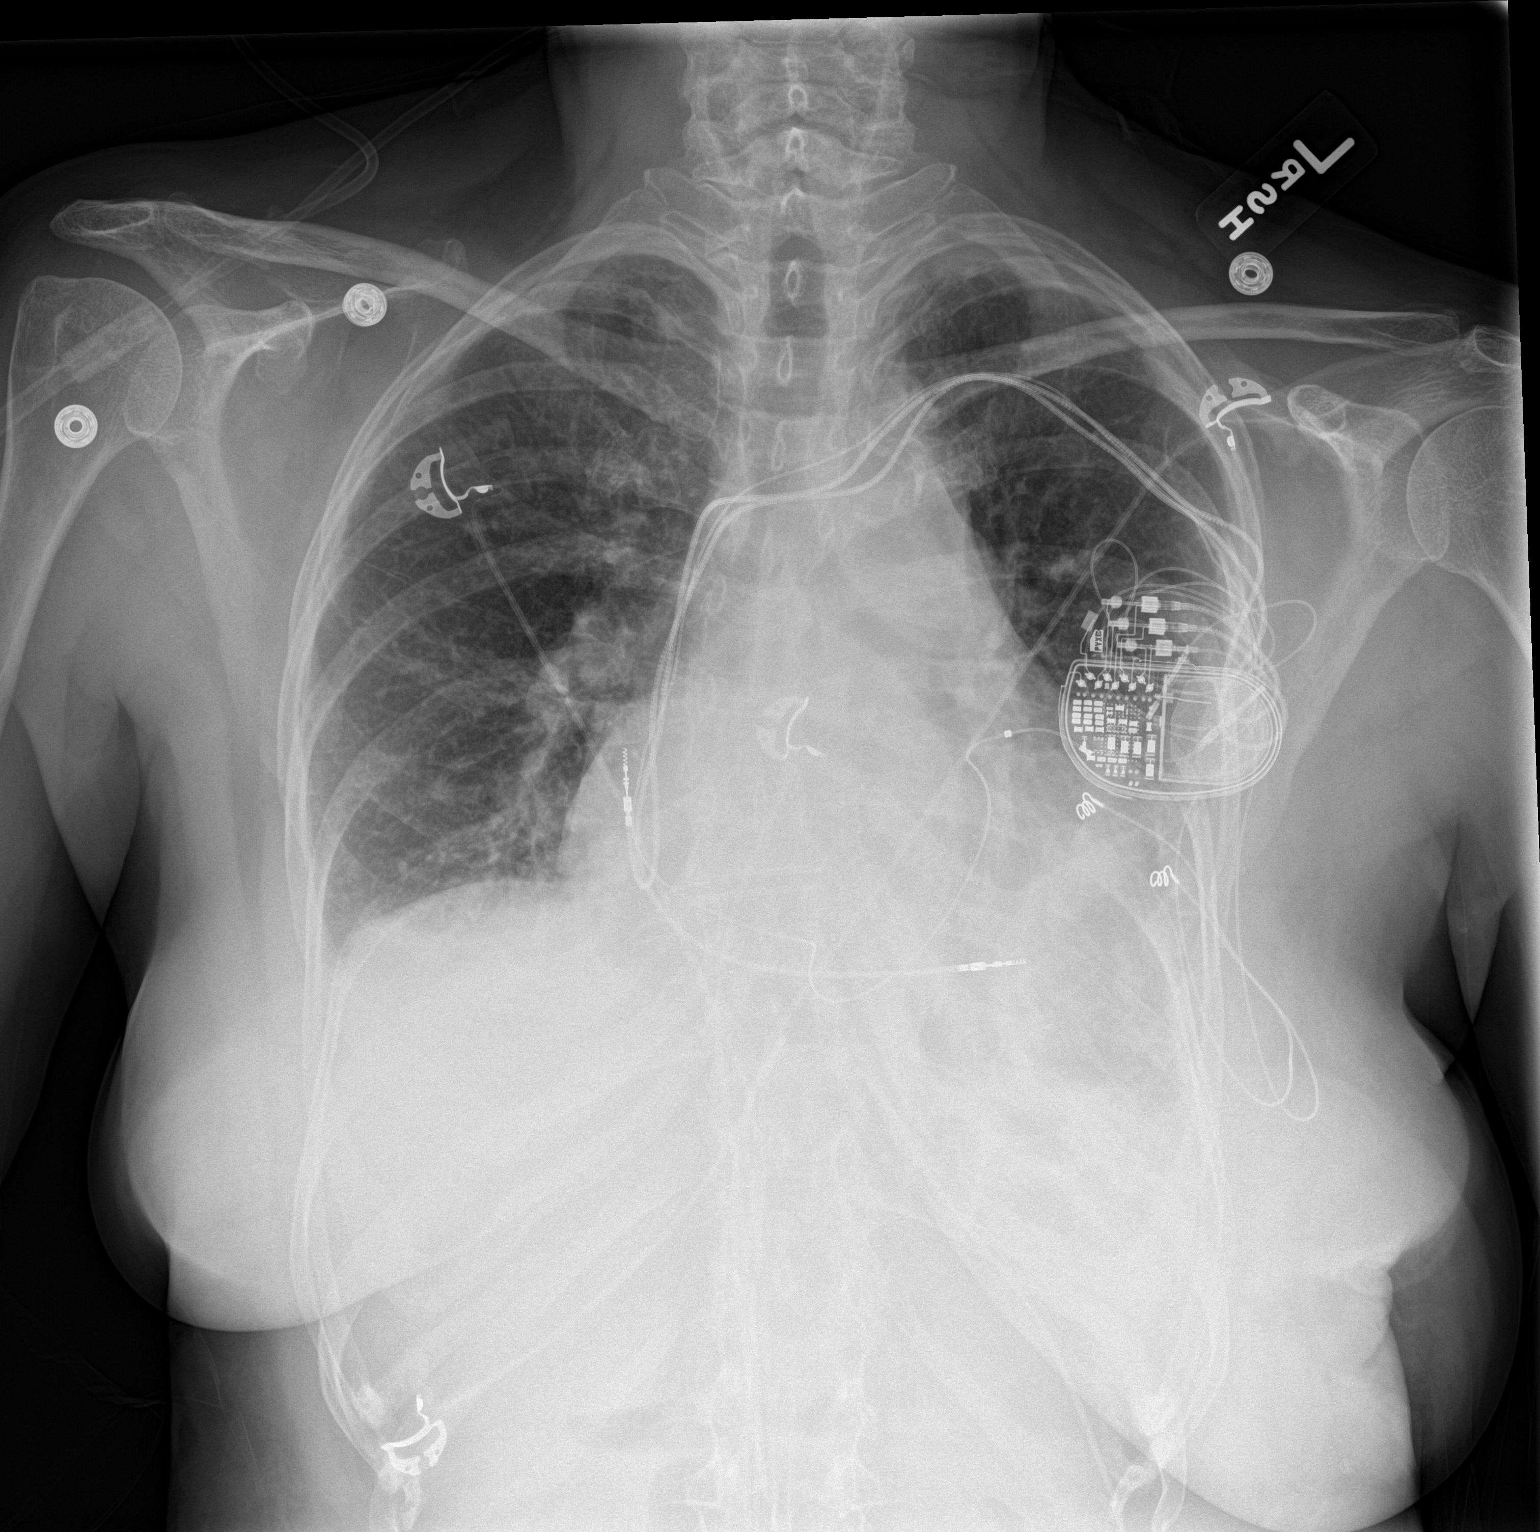

[chest lat]
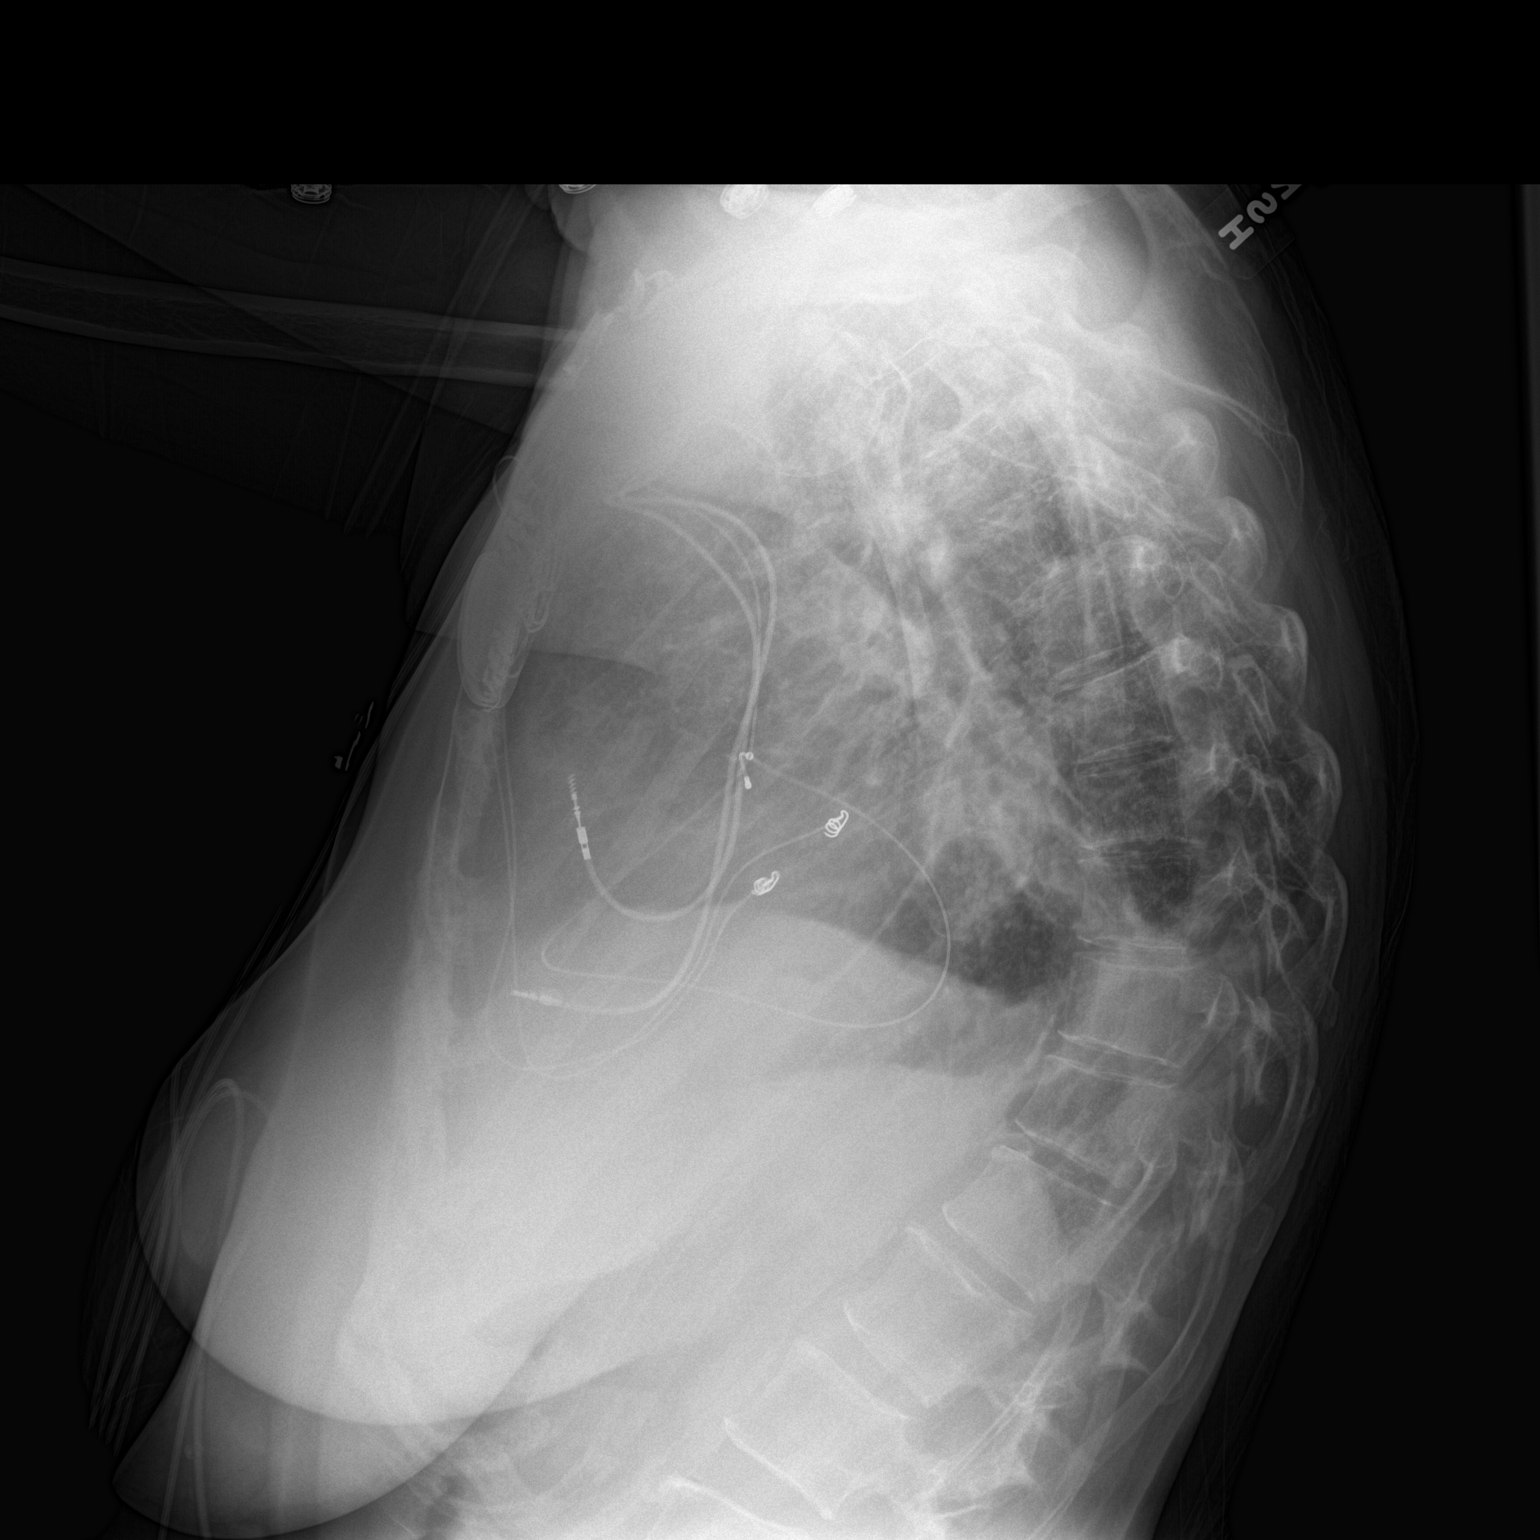

[2 of 2 positions shown; findings below may reference images not displayed]

FINDINGS: Interval removal of left chest tube. No pneumothorax. Mediastinum
and hilar structures are normal. Cardiac pacer in stable position.
Stable cardiomegaly with normal pulmonary vascularity. Interval
improvement of left base atelectasis and left pleural effusion.
IMPRESSION: 1. Interim removal of left chest tube.  No pneumothorax.
2. Interim improvement of left base atelectasis and or infiltrate.
Improvement of left pleural effusion.
3. Cardiac pacer stable position.  Stable cardiomegaly.

## 2016-06-10 ENCOUNTER — Ambulatory Visit (INDEPENDENT_AMBULATORY_CARE_PROVIDER_SITE_OTHER): Payer: Medicare Other | Admitting: *Deleted

## 2016-06-10 ENCOUNTER — Telehealth: Payer: Self-pay | Admitting: Cardiology

## 2016-06-10 DIAGNOSIS — I5022 Chronic systolic (congestive) heart failure: Secondary | ICD-10-CM

## 2016-06-10 DIAGNOSIS — I428 Other cardiomyopathies: Secondary | ICD-10-CM

## 2016-06-10 NOTE — Progress Notes (Signed)
Remote pacemaker transmission.   

## 2016-06-10 NOTE — Telephone Encounter (Signed)
Spoke with pt and reminded pt of remote transmission that is due today. Pt verbalized understanding.   

## 2016-06-11 ENCOUNTER — Encounter: Payer: Self-pay | Admitting: Cardiology

## 2016-06-29 ENCOUNTER — Other Ambulatory Visit: Payer: Self-pay | Admitting: Cardiology

## 2016-07-07 LAB — CUP PACEART REMOTE DEVICE CHECK
Battery Remaining Longevity: 28 mo
Brady Statistic AP VS Percent: 0 %
Brady Statistic AS VS Percent: 8.3 %
Brady Statistic RA Percent Paced: 0 %
Implantable Lead Implant Date: 20131120
Implantable Lead Implant Date: 20131120
Implantable Lead Location: 753860
Implantable Lead Model: 5076
Implantable Pulse Generator Implant Date: 20131120
Lead Channel Impedance Value: 361 Ohm
Lead Channel Impedance Value: 399 Ohm
Lead Channel Impedance Value: 4047 Ohm
Lead Channel Impedance Value: 513 Ohm
Lead Channel Impedance Value: 722 Ohm
Lead Channel Pacing Threshold Amplitude: 0.5 V
Lead Channel Pacing Threshold Pulse Width: 0.4 ms
Lead Channel Pacing Threshold Pulse Width: 1 ms
Lead Channel Sensing Intrinsic Amplitude: 10.25 mV
Lead Channel Sensing Intrinsic Amplitude: 10.25 mV
Lead Channel Setting Pacing Amplitude: 3 V
Lead Channel Setting Pacing Pulse Width: 1 ms
MDC IDC LEAD IMPLANT DT: 20131120
MDC IDC LEAD LOCATION: 753858
MDC IDC LEAD LOCATION: 753859
MDC IDC LEAD MODEL: 4196
MDC IDC MSMT BATTERY VOLTAGE: 2.96 V
MDC IDC MSMT LEADCHNL LV IMPEDANCE VALUE: 4047 Ohm
MDC IDC MSMT LEADCHNL LV IMPEDANCE VALUE: 4047 Ohm
MDC IDC MSMT LEADCHNL LV IMPEDANCE VALUE: 570 Ohm
MDC IDC MSMT LEADCHNL LV PACING THRESHOLD AMPLITUDE: 1.75 V
MDC IDC MSMT LEADCHNL RA SENSING INTR AMPL: 0.75 mV
MDC IDC MSMT LEADCHNL RA SENSING INTR AMPL: 0.75 mV
MDC IDC MSMT LEADCHNL RV IMPEDANCE VALUE: 570 Ohm
MDC IDC SESS DTM: 20171110004538
MDC IDC SET LEADCHNL RV PACING AMPLITUDE: 2 V
MDC IDC SET LEADCHNL RV PACING PULSEWIDTH: 0.4 ms
MDC IDC SET LEADCHNL RV SENSING SENSITIVITY: 4 mV
MDC IDC STAT BRADY AP VP PERCENT: 0 %
MDC IDC STAT BRADY AS VP PERCENT: 91.7 %
MDC IDC STAT BRADY RV PERCENT PACED: 91.7 %

## 2016-08-08 ENCOUNTER — Other Ambulatory Visit: Payer: Self-pay | Admitting: Cardiology

## 2016-08-12 ENCOUNTER — Other Ambulatory Visit: Payer: Self-pay | Admitting: Cardiology

## 2016-09-01 ENCOUNTER — Other Ambulatory Visit: Payer: Self-pay | Admitting: Cardiology

## 2016-09-01 ENCOUNTER — Ambulatory Visit: Payer: Self-pay | Admitting: Cardiology

## 2016-09-09 ENCOUNTER — Telehealth: Payer: Self-pay | Admitting: Cardiology

## 2016-09-09 ENCOUNTER — Ambulatory Visit (INDEPENDENT_AMBULATORY_CARE_PROVIDER_SITE_OTHER): Payer: Medicare Other | Admitting: *Deleted

## 2016-09-09 DIAGNOSIS — I428 Other cardiomyopathies: Secondary | ICD-10-CM | POA: Diagnosis not present

## 2016-09-09 DIAGNOSIS — I5022 Chronic systolic (congestive) heart failure: Secondary | ICD-10-CM

## 2016-09-09 NOTE — Progress Notes (Signed)
Remote pacemaker transmission.   

## 2016-09-09 NOTE — Telephone Encounter (Signed)
Spoke with pt and reminded pt of remote transmission that is due today. Pt verbalized understanding.   

## 2016-09-10 ENCOUNTER — Encounter: Payer: Self-pay | Admitting: Cardiology

## 2016-09-10 LAB — CUP PACEART REMOTE DEVICE CHECK
Battery Remaining Longevity: 28 mo
Brady Statistic AP VS Percent: 0 %
Brady Statistic AS VS Percent: 6.96 %
Brady Statistic RA Percent Paced: 0 %
Implantable Lead Implant Date: 20131120
Implantable Lead Implant Date: 20131120
Implantable Lead Implant Date: 20131120
Implantable Lead Location: 753860
Implantable Lead Model: 4196
Implantable Lead Model: 5076
Implantable Lead Model: 5076
Implantable Pulse Generator Implant Date: 20131120
Lead Channel Impedance Value: 304 Ohm
Lead Channel Impedance Value: 4047 Ohm
Lead Channel Impedance Value: 456 Ohm
Lead Channel Impedance Value: 475 Ohm
Lead Channel Impedance Value: 646 Ohm
Lead Channel Pacing Threshold Amplitude: 0.5 V
Lead Channel Pacing Threshold Amplitude: 2 V
Lead Channel Pacing Threshold Pulse Width: 0.4 ms
Lead Channel Pacing Threshold Pulse Width: 1 ms
Lead Channel Sensing Intrinsic Amplitude: 9 mV
Lead Channel Sensing Intrinsic Amplitude: 9 mV
Lead Channel Setting Pacing Amplitude: 3 V
Lead Channel Setting Pacing Pulse Width: 1 ms
Lead Channel Setting Sensing Sensitivity: 4 mV
MDC IDC LEAD LOCATION: 753858
MDC IDC LEAD LOCATION: 753859
MDC IDC MSMT BATTERY VOLTAGE: 2.95 V
MDC IDC MSMT LEADCHNL LV IMPEDANCE VALUE: 4047 Ohm
MDC IDC MSMT LEADCHNL LV IMPEDANCE VALUE: 4047 Ohm
MDC IDC MSMT LEADCHNL LV IMPEDANCE VALUE: 513 Ohm
MDC IDC MSMT LEADCHNL RA IMPEDANCE VALUE: 342 Ohm
MDC IDC MSMT LEADCHNL RA SENSING INTR AMPL: 0.625 mV
MDC IDC MSMT LEADCHNL RA SENSING INTR AMPL: 0.625 mV
MDC IDC SESS DTM: 20180209010653
MDC IDC SET LEADCHNL RV PACING AMPLITUDE: 2 V
MDC IDC SET LEADCHNL RV PACING PULSEWIDTH: 0.4 ms
MDC IDC STAT BRADY AP VP PERCENT: 0 %
MDC IDC STAT BRADY AS VP PERCENT: 93.04 %
MDC IDC STAT BRADY RV PERCENT PACED: 94.59 %

## 2016-09-21 ENCOUNTER — Telehealth: Payer: Self-pay

## 2016-09-21 NOTE — Telephone Encounter (Signed)
Patient returned call and explained program.  She agreed to monthly calls and would like to check the fluid levels this week.  1st ICM remote transmission scheduled for 09/23/2016.  She stated she feels ok but has some shortness of breath.  Reviewed fluid symptoms.

## 2016-09-21 NOTE — Telephone Encounter (Signed)
Referred to ICM clinic by Acie Fredrickson, Device RN/Dr Ladona Ridgel due to Optivol impedance trending below baseline suggesting fluid accumulation at last 09/09/2016 remote transmission.   Attempted ICM intro call and left message to return call.

## 2016-09-22 ENCOUNTER — Other Ambulatory Visit: Payer: Self-pay | Admitting: Cardiology

## 2016-09-23 ENCOUNTER — Ambulatory Visit (INDEPENDENT_AMBULATORY_CARE_PROVIDER_SITE_OTHER): Payer: Medicare Other

## 2016-09-23 ENCOUNTER — Telehealth: Payer: Self-pay | Admitting: Cardiology

## 2016-09-23 DIAGNOSIS — I5022 Chronic systolic (congestive) heart failure: Secondary | ICD-10-CM

## 2016-09-23 DIAGNOSIS — Z950 Presence of cardiac pacemaker: Secondary | ICD-10-CM

## 2016-09-23 NOTE — Progress Notes (Signed)
EPIC Encounter for ICM Monitoring  Patient Name: [name] [name] is a 61 y.o. female Date: 09/23/2016 Primary Care Physican: Fredirick Maudlin, MD Primary Cardiologist: Wyline Mood Electrophysiologist: Ladona Ridgel Dry Weight: 137 lbs  Bi-V Pacing:  94.5%   Clinical Status (09-Sep-2016 to 23-Sep-2016) AT/AF 1 episode  Time in AT/AF 24.0 hr/day (100.0%)  Observations (1) (09-Sep-2016 to 23-Sep-2016)  15 days with more than 6 hr AT/AF.     1st ICM encounter.  Heart Failure questions reviewed, pt reported breathing has improved.    Thoracic impedance has been abnormal suggesting fluid accumulation since 08/27/2016 but returned to normal in the last 2 days.     Prescribed dosage Furosemide 20 mg daily and may take extra tablet if weight is > 133 lbs  Recommendations: No changes. Discussed limiting dietary salt intake to 2000 mg/day and fluid intake to < 2 liters/day. Encouraged to call for fluid symptoms.  Follow-up plan: ICM clinic phone appointment on 10/21/2016 and office appointment with Dr Wyline Mood on 10/22/2016.  Copy of ICM check sent to primary cardiologist and device physician.   3 month ICM trend: 09/23/2016   1 Year ICM trend:            [name], RN 09/23/2016 12:54 PM

## 2016-09-23 NOTE — Telephone Encounter (Signed)
Spoke with pt and reminded pt of remote transmission that is due today. Pt verbalized understanding.   

## 2016-10-08 NOTE — Progress Notes (Signed)
ICM remote transmission rescheduled from 10/21/2016 to 10/18/2016.  

## 2016-10-11 ENCOUNTER — Other Ambulatory Visit: Payer: Self-pay

## 2016-10-11 MED ORDER — CARVEDILOL 12.5 MG PO TABS: 12.5000 mg | ORAL_TABLET | Freq: Two times a day (BID) | ORAL | 0 refills | Status: DC

## 2016-10-11 MED ORDER — RIVAROXABAN 20 MG PO TABS: 20.0000 mg | ORAL_TABLET | Freq: Every day | ORAL | 0 refills | Status: DC

## 2016-10-11 MED ORDER — TIOTROPIUM BROMIDE MONOHYDRATE 18 MCG IN CAPS: ORAL_CAPSULE | RESPIRATORY_TRACT | 0 refills | Status: AC

## 2016-10-11 MED ORDER — LISINOPRIL 5 MG PO TABS: 5.0000 mg | ORAL_TABLET | Freq: Every day | ORAL | 0 refills | Status: DC

## 2016-10-11 MED ORDER — MAGNESIUM OXIDE 400 (241.3 MG) MG PO TABS: 1.0000 | ORAL_TABLET | Freq: Every day | ORAL | 0 refills | Status: AC

## 2016-10-11 MED ORDER — FUROSEMIDE 20 MG PO TABS: ORAL_TABLET | ORAL | 0 refills | Status: DC

## 2016-10-18 ENCOUNTER — Telehealth: Payer: Self-pay | Admitting: Cardiology

## 2016-10-18 NOTE — Telephone Encounter (Signed)
Spoke with pt and reminded pt of remote transmission that is due today. Pt verbalized understanding.   

## 2016-10-19 NOTE — Progress Notes (Signed)
No ICM remote transmission received for 10/18/2016 and next ICM transmission scheduled for 11/04/2016.

## 2016-10-22 ENCOUNTER — Encounter: Payer: Self-pay | Admitting: Cardiology

## 2016-10-22 ENCOUNTER — Ambulatory Visit (INDEPENDENT_AMBULATORY_CARE_PROVIDER_SITE_OTHER): Payer: Medicare Other | Admitting: Cardiology

## 2016-10-22 VITALS — BP 156/80 | HR 73 | Ht 64.0 in | Wt 140.0 lb

## 2016-10-22 DIAGNOSIS — I340 Nonrheumatic mitral (valve) insufficiency: Secondary | ICD-10-CM | POA: Diagnosis not present

## 2016-10-22 DIAGNOSIS — I428 Other cardiomyopathies: Secondary | ICD-10-CM

## 2016-10-22 DIAGNOSIS — I4891 Unspecified atrial fibrillation: Secondary | ICD-10-CM

## 2016-10-22 DIAGNOSIS — I5022 Chronic systolic (congestive) heart failure: Secondary | ICD-10-CM

## 2016-10-22 NOTE — Progress Notes (Signed)
Clinical Summary [name] [name] is a 61 y.o.female seen today for follow up of the following medical problems.   1. NICM  - echo 10/2013 LVEF 30-35%, restrictive diastolic dysfunction - cath Jan 2011 showed no obstructive CAD  - repeat echo 10/2015 LVEF 45-50%.    - can have some SOB at times. Can have some cough, last week had cold like symptoms.  - no recent LE edema.  - compliant with meds  2. Chronic afib  - prior AV nodal ablation with permanent pacemaker   - just occasional rare palpitations.  - no bleeding troubles on xarelto.   3. Moderate MR - no recent symptoms  4. Pacemaker - she has a Medtronic BiV pacemaker followed by Dr Ladona Ridgel, LV lead replaced epicardial by Dr Laneta Simmers 01/2015  - no recent symptoms  5. COPD - management per Dr Juanetta Gosling.  - appt coming up soon.   6. HTN - complaint with meds   Past Medical History:  Diagnosis Date  . Anemia    2011: Hemoglobin of 11.4; hematocrit of 33.5; normal MCV  . Anxiety   . Anxiety and depression   . Atrial fibrillation (HCC)    Echocardiogram in 2004-borderline LV function; no valvular abnormalities; 06/2012: AV nodal ablation + biventricular pacemaker  . Cardiac arrest (HCC) 06/2012   16 day hospitalization; caused by hyperkalemia; multiple complications including CHF; permanent atrial fibrillation requiring AV nodal ablation + pacing  . CHF (congestive heart failure) (HCC)   . COPD (chronic obstructive pulmonary disease) (HCC)    heterozygous alpha-1 anti-trypsin deficiency  . Depression   . Dysrhythmia   . GERD (gastroesophageal reflux disease)    protonix  . Nonischemic cardiomyopathy (HCC)    Echo 06/2012: EF 30-35%, moderate to severe MR; cardiac cath 06/2012:30-40% D1 and D2, estimated EF-35%, MR-not graded; right bundle [place] block  . Presence of permanent cardiac pacemaker   . Shortness of breath dyspnea   . Thyroid disease   . Urinary frequency      No Known  Allergies   Current Outpatient Prescriptions  Medication Sig Dispense Refill  . albuterol (PROVENTIL HFA;VENTOLIN HFA) 108 (90 BASE) MCG/ACT inhaler Inhale 2 puffs into the lungs every 6 (six) hours as needed for wheezing or shortness of breath. 1 Inhaler 2  . ALPRAZolam (XANAX) 0.25 MG tablet Take 0.25 mg by mouth 2 (two) times daily as needed for anxiety.    . calcium carbonate (TUMS - DOSED IN MG ELEMENTAL CALCIUM) 500 MG chewable tablet Chew 2 tablets by mouth 3 (three) times daily.    . carvedilol (COREG) 12.5 MG tablet Take 1 tablet (12.5 mg total) by mouth 2 (two) times daily with a meal. 180 tablet 0  . furosemide (LASIX) 20 MG tablet TAKE ONE TABLET BY MOUTH ONCE DAILY - IF  WEIGHT  IS  ABOVE  133(LBS)  THAT  DAY  TAKE  40(MG) 90 tablet 0  . lisinopril (PRINIVIL,ZESTRIL) 5 MG tablet Take 1 tablet (5 mg total) by mouth daily. 90 tablet 0  . magnesium oxide (MAG-OX) 400 (241.3 Mg) MG tablet Take 1 tablet (400 mg total) by mouth daily. 90 tablet 0  . oxyCODONE (OXY IR/ROXICODONE) 5 MG immediate release tablet Take 1-2 tablets (5-10 mg total) by mouth every 4 (four) hours as needed for severe pain. 30 tablet 0  . pantoprazole (PROTONIX) 40 MG tablet Take 40 mg by mouth daily.    . rivaroxaban (XARELTO) 20 MG TABS tablet Take 1 tablet (20  mg total) by mouth daily. 90 tablet 0  . sertraline (ZOLOFT) 50 MG tablet Take 50 mg by mouth daily.    Marland Kitchen tiotropium (SPIRIVA HANDIHALER) 18 MCG inhalation capsule INHALE ONE DOSE IN INHALER BY MOUTH ONCE DAILY 90 capsule 0  . zolpidem (AMBIEN) 5 MG tablet Take 5 mg by mouth at bedtime. For sleep     No current facility-administered medications for this visit.      Past Surgical History:  Procedure Laterality Date  . AV NODE ABLATION N/A 06/21/2012   Procedure: AV NODE ABLATION;  Surgeon: Marinus Maw, MD;  Location: [facility] CATH LAB;  Service: Cardiovascular;  Laterality: N/A;  . AV NODE ABLATION N/A 06/22/2012   Procedure: AV NODE ABLATION;  Surgeon:  Duke Salvia, MD;  Location: [facility] CATH LAB;  Service: Cardiovascular;  Laterality: N/A;  . BREAST BIOPSY     left, [facility]  . BREAST EXCISIONAL BIOPSY     Left x2  . CARDIOVERSION  06/19/2012   Procedure: CARDIOVERSION;  Surgeon: Vesta Mixer, MD;  Location: [facility] ENDOSCOPY;  Service: Cardiovascular;;  . CARDIOVERSION  06/21/2012   Procedure: CARDIOVERSION;  Surgeon: Cassell Clement, MD;  Location: [facility] OR;  Service: Cardiovascular;  Laterality: N/A;  . CERVICAL CONIZATION W/BX  08/11/2011   Procedure: CONIZATION CERVIX WITH BIOPSY;  Surgeon: Lazaro Arms, MD;  Location: [facility] ORS;  Service: Gynecology;  Laterality: N/A;  Laser Conization of Cervix  . EPICARDIAL PACING LEAD PLACEMENT N/A 02/28/2015   Procedure: LV EPICARDIAL PACING LEAD PLACEMENT;  Surgeon: Alleen Borne, MD;  Location: [facility] OR;  Service: Thoracic;  Laterality: N/A;  . INSERT / REPLACE / REMOVE PACEMAKER    . LEFT HEART CATHETERIZATION WITH CORONARY ANGIOGRAM N/A 06/15/2012   Procedure: LEFT HEART CATHETERIZATION WITH CORONARY ANGIOGRAM;  Surgeon: Herby Abraham, MD;  Location: [facility] CATH LAB;  Service: Cardiovascular;  Laterality: N/A;  . PERMANENT PACEMAKER INSERTION N/A 06/21/2012   Procedure: PERMANENT PACEMAKER INSERTION;  Surgeon: Marinus Maw, MD;  Location: [facility] CATH LAB;  Service: Cardiovascular;  Laterality: N/A;  . TEE WITHOUT CARDIOVERSION  06/19/2012   Procedure: TRANSESOPHAGEAL ECHOCARDIOGRAM (TEE);  Surgeon: Vesta Mixer, MD;  Location: [facility] ENDOSCOPY;  Service: Cardiovascular;  Laterality: N/A;  . THORACOTOMY Left 02/28/2015   Procedure: THORACOTOMY MAJOR;  Surgeon: Alleen Borne, MD;  Location: [facility] OR;  Service: Thoracic;  Laterality: Left;     No Known Allergies    Family History  Problem Relation Age of Onset  . Cancer Father     carcinoma of the lung  . Cervical cancer Mother   . Cancer Sister     carcinoma of the lung  . Irritable bowel syndrome      mother, sister  . Anesthesia problems Neg Hx   .  Hypotension Neg Hx   . Malignant hyperthermia Neg Hx   . Pseudochol deficiency Neg Hx   . Colon cancer Neg Hx   . Celiac disease Neg Hx   . Inflammatory bowel disease Neg Hx      Social History [name] [name] reports that she has never smoked. She has never used smokeless tobacco. [name] [name] reports that she does not drink alcohol.   Review of Systems CONSTITUTIONAL: No weight loss, fever, chills, weakness or fatigue.  HEENT: Eyes: No visual loss, blurred vision, double vision or yellow sclerae.No hearing loss, sneezing, congestion, runny nose or sore throat.  SKIN: No rash or itching.  CARDIOVASCULAR: per HPI RESPIRATORY: No shortness of breath, cough  or sputum.  GASTROINTESTINAL: No anorexia, nausea, vomiting or diarrhea. No abdominal pain or blood.  GENITOURINARY: No burning on urination, no polyuria NEUROLOGICAL: No headache, dizziness, syncope, paralysis, ataxia, numbness or tingling in the extremities. No change in bowel or bladder control.  MUSCULOSKELETAL: No muscle, back pain, joint pain or stiffness.  LYMPHATICS: No enlarged nodes. No history of splenectomy.  PSYCHIATRIC: No history of depression or anxiety.  ENDOCRINOLOGIC: No reports of sweating, cold or heat intolerance. No polyuria or polydipsia.  Marland Kitchen   Physical Examination Vitals:   10/22/16 1547  BP: (!) 156/80  Pulse: 73   Vitals:   10/22/16 1547  Weight: 140 lb (63.5 kg)  Height: 5\' 4"  (1.626 m)    Gen: resting comfortably, no acute distress HEENT: no scleral icterus, pupils equal round and reactive, no palptable cervical adenopathy,  CV: RRR, no m/r/g, no jvd Resp: Clear to auscultation bilaterally GI: abdomen is soft, non-tender, non-distended, normal bowel sounds, no hepatosplenomegaly MSK: extremities are warm, no edema.  Skin: warm, no rash Neuro:  no focal deficits Psych: appropriate affect   Diagnostic Studies  06/2012 TEE: mod to severe MR, milld to mod TR   06/12/12 Cath Hemodynamics:   AO 118/68 (83)  LV 119/8  Cannot calculate gradient due to rapid atrial fib  Coronary angiography:  Coronary dominance: right  Left mainstem: Short without significant disease  Left anterior descending (LAD): There is calcification proximally. There are two major diagonal branches. Between the first and second diagonal branches are approximately 30-40% plaquing. The distal LAD has mild irregularity. Both diagonals have minimal irregularity.  Left circumflex (LCx): Provides one large bifurcation marginal Carrington Mullenax and a small AV circumflex. There is minor distal irregularity. The AV circ is small  Right coronary artery (RCA): Provides a large PDA and a large and smaller PLA branches. No significant focal obstruction.  Left ventriculography: Due to ectopy, EF cannot be calculated. With AF, difficult to be sure of function but appears 35%. There does appear to be some MR.  Final Conclusions:  1. Atrial fib with rapid ventricular response.  2. Mild calcification of proximal LAD without critical obstruction. No high grade coronary artery disease.  09/2012 Echo: LVEF 25%, severely dilated LV, mild LVH, mild AI, mild MR   09/26/13 Clinic EKG Afib, V-paced  11/08/13 Echo Study Conclusions  - Left ventricle: The cavity size was moderately dilated. Wall thickness was increased in a pattern of mild LVH. Systolic function was moderately to severely reduced. The estimated ejection fraction was in the range of 30% to 35%. Diffuse hypokinesis. There is severe hypokinesis of the anteroseptal myocardium. Doppler parameters are consistent with restrictive physiology, indicative of decreased left ventricular diastolic compliance and/or increased left atrial pressure. - Ventricular septum: Septal motion showed abnormal function and dyssynergy. - Aortic valve: Trileaflet; mildly thickened leaflets. Mild to moderate regurgitation. - Mitral valve: Mildly thickened leaflets .  Moderate regurgitation, appears to be made up of two jets. Suboptimal PISA. - Left atrium: The atrium was severely dilated. - Right ventricle: The cavity size was mildly dilated. Pacer wire or catheter noted in right ventricle. - Right atrium: The atrium was moderately to severely dilated. - Tricuspid valve: Mild-moderate regurgitation. - Pulmonic valve: Mild regurgitation. - Pulmonary arteries: Systolic pressure was moderately increased. - Pericardium, extracardiac: There was no pericardial effusion. Impressions:  - Mild LVH with moderate chamber dilatation and LVEF 30-35% with diffuse hypokinesis, most prominent anteroseptal wall in setting of septal dyssynergy and pacing. Restrictive diastolic filling pattern with  increased filling pressures. Severe left atrial and moderate to severe right atrial enlargement. MIldly thickened mitral valve with overall moderate mitral regurgitation made up of two jets (PISA suboptimal). Mild to moderate aortic regurgitation. Device wire in right heart. MIld to moderate tricuspid regurgitation with PASP 51 mmHg.   10/2015 echo Study Conclusions  - Left ventricle: The cavity size was normal. Systolic function was mildly reduced. The estimated ejection fraction was approximately 45%. Diffuse hypokinesis. Diastolic dysfunction, indeterminate grade and filling pressures. Mild septal thickening. - Aortic valve: Mildly to moderately calcified annulus. Trileaflet. There was mild to moderate regurgitation. - Mitral valve: There was moderate regurgitation. - Left atrium: The atrium was severely dilated. - Right ventricle: Pacer wire or catheter noted in right ventricle. - Right atrium: The atrium was mildly to moderately dilated. - Tricuspid valve: There was mild regurgitation. - Pulmonic valve: There was mild regurgitation.      Assessment and Plan  1. NICM/Chronic systolic HF - echo 10/2015 showed LVEF improved to 45-50% - no  current symptoms. We will continue current meds  2. Afib  - hx of av nodal ablation with pacemaker  -no recent palpitations.  - CHADS2Vasc score is 4, she will continue anticoag   3. Mitral regurgitation  - moderate to severe by TEE 06/2012, follow up TTE's have been stable mild to moderate. No current symptoms, likely functional MR related to her LV systolic dysfunction.  -continue to follow clinically   4. Pacemaker - she will continue to follow with device clinic   5. COPD - followed by Dr Juanetta Gosling.   6. HTN -her bp is at goal, continue current meds - initially elevated by dynamap check, manual bp 138/70   F/u 6 months.        [name], M.D.

## 2016-10-22 NOTE — Patient Instructions (Signed)

## 2016-10-25 ENCOUNTER — Other Ambulatory Visit: Payer: Self-pay | Admitting: *Deleted

## 2016-10-25 MED ORDER — LISINOPRIL 5 MG PO TABS: 5.0000 mg | ORAL_TABLET | Freq: Every day | ORAL | 1 refills | Status: DC

## 2016-11-04 ENCOUNTER — Telehealth: Payer: Self-pay | Admitting: Cardiology

## 2016-11-04 ENCOUNTER — Ambulatory Visit (INDEPENDENT_AMBULATORY_CARE_PROVIDER_SITE_OTHER): Payer: Medicare Other

## 2016-11-04 DIAGNOSIS — I5022 Chronic systolic (congestive) heart failure: Secondary | ICD-10-CM | POA: Diagnosis not present

## 2016-11-04 DIAGNOSIS — Z950 Presence of cardiac pacemaker: Secondary | ICD-10-CM | POA: Diagnosis not present

## 2016-11-04 NOTE — Telephone Encounter (Signed)
LMOVM reminding pt to send remote transmission.   

## 2016-11-04 NOTE — Progress Notes (Signed)
EPIC Encounter for ICM Monitoring  Patient Name: [name] [name] is a 61 y.o. female Date: 11/04/2016 Primary Care Physican: Fredirick Maudlin, MD Primary Cardiologist: Wyline Mood Electrophysiologist: Ladona Ridgel Dry Weight:   139 lbs  Bi-V Pacing:  92.5%        Heart Failure questions reviewed, pt asymptomatic.   Thoracic impedance normal except has been abnormal suggesting fluid accumulation for the last 2 days but trending back to baseline.  Prescribed dosage Furosemide 20 mg daily and may take extra tablet if weight is > 133 lbs  Recommendations:  She reported eating foods high in salt.  Discussed to limit salt intake to 2000 mg/day and fluid intake to < 2 liters/day. Encouraged to call for fluid symptoms.  Follow-up plan: ICM clinic phone appointment on 12/07/2016.  Office appointment scheduled on 01/20/2017 with Dr Ladona Ridgel.  Copy of ICM check sent to primary cardiologist and device physician.   3 month ICM trend: 11/04/2016   1 Year ICM trend:      [name], RN 11/04/2016 4:48 PM

## 2016-11-09 ENCOUNTER — Telehealth: Payer: Self-pay | Admitting: Cardiology

## 2016-11-09 NOTE — Telephone Encounter (Signed)
Advised patient order states take furosemide 20mg  daily if weight is above 133 lbs take 40mg  daily. Patient verbalized understanding.

## 2016-11-09 NOTE — Telephone Encounter (Signed)
[name] [name] called stating that she needs to know if her Lasix needs to be increased or Decreased.  Please call [phone].

## 2016-11-22 DIAGNOSIS — H524 Presbyopia: Secondary | ICD-10-CM | POA: Diagnosis not present

## 2016-11-22 DIAGNOSIS — H25043 Posterior subcapsular polar age-related cataract, bilateral: Secondary | ICD-10-CM | POA: Diagnosis not present

## 2016-12-07 ENCOUNTER — Ambulatory Visit (INDEPENDENT_AMBULATORY_CARE_PROVIDER_SITE_OTHER): Payer: Medicare Other

## 2016-12-07 DIAGNOSIS — Z950 Presence of cardiac pacemaker: Secondary | ICD-10-CM

## 2016-12-07 DIAGNOSIS — I5022 Chronic systolic (congestive) heart failure: Secondary | ICD-10-CM | POA: Diagnosis not present

## 2016-12-07 NOTE — Progress Notes (Addendum)
EPIC Encounter for ICM Monitoring  Patient Name: [name] [name] is a 61 y.o. female Date: 12/07/2016 Primary Care Physican: Kari Baars, MD Primary Cardiologist: Wyline Mood Electrophysiologist: Ladona Ridgel Dry Weight:138lbs  Bi-V Pacing: 97.1%     Clinical Status (04-Nov-2016 to 07-Dec-2016) V. Pacing 97.1%  AT/AF 1 episode  Time in AT/AF 24.0 hr/day (100.0%)   Observations (1) (04-Nov-2016 to 07-Dec-2016)  34 days with more than 6 hr AT/AF.       Heart Failure questions reviewed, pt asymptomatic.  She stated she is feeling fine   Thoracic impedance normal.  Prescribed dosage Furosemide 20 mg daily and may take extra tablet for total of 40 mg if weight is > 133 lbs  Recommendations: No changes. Discussed to limit salt intake to 2000 mg/day and fluid intake to < 2 liters/day.  Encouraged to call for fluid symptoms or use local ER for any urgent symptoms.  Follow-up plan: ICM clinic phone appointment on 02/22/2017 since she has pacer check in the office on 01/20/2017 with Dr Ladona Ridgel.  Copy of ICM check sent to device physician.   3 month ICM trend: 12/07/2016   1 Year ICM trend:      [name], RN 12/07/2016 2:11 PM

## 2016-12-11 ENCOUNTER — Other Ambulatory Visit: Payer: Self-pay | Admitting: Cardiology

## 2016-12-13 ENCOUNTER — Other Ambulatory Visit: Payer: Self-pay | Admitting: *Deleted

## 2016-12-13 MED ORDER — RIVAROXABAN 20 MG PO TABS: 20.0000 mg | ORAL_TABLET | Freq: Every day | ORAL | 1 refills | Status: DC

## 2017-01-19 ENCOUNTER — Encounter: Payer: Self-pay | Admitting: *Deleted

## 2017-01-20 ENCOUNTER — Encounter: Payer: Self-pay | Admitting: Internal Medicine

## 2017-01-20 ENCOUNTER — Ambulatory Visit (INDEPENDENT_AMBULATORY_CARE_PROVIDER_SITE_OTHER): Payer: Medicare Other | Admitting: Internal Medicine

## 2017-01-20 DIAGNOSIS — I4891 Unspecified atrial fibrillation: Secondary | ICD-10-CM | POA: Diagnosis not present

## 2017-01-20 DIAGNOSIS — I5022 Chronic systolic (congestive) heart failure: Secondary | ICD-10-CM

## 2017-01-20 LAB — CUP PACEART INCLINIC DEVICE CHECK
Battery Remaining Longevity: 24 mo
Brady Statistic AP VS Percent: 0 %
Brady Statistic AS VS Percent: 6.88 %
Brady Statistic RA Percent Paced: 0 %
Brady Statistic RV Percent Paced: 94.64 %
Implantable Lead Implant Date: 20131120
Implantable Lead Implant Date: 20131120
Implantable Lead Location: 753860
Implantable Lead Model: 4196
Implantable Lead Model: 5076
Implantable Pulse Generator Implant Date: 20131120
Lead Channel Impedance Value: 342 Ohm
Lead Channel Impedance Value: 380 Ohm
Lead Channel Impedance Value: 4047 Ohm
Lead Channel Impedance Value: 4047 Ohm
Lead Channel Impedance Value: 513 Ohm
Lead Channel Pacing Threshold Amplitude: 0.5 V
Lead Channel Pacing Threshold Amplitude: 1.875 V
Lead Channel Sensing Intrinsic Amplitude: 5.875 mV
Lead Channel Setting Pacing Amplitude: 2 V
Lead Channel Setting Pacing Amplitude: 3 V
Lead Channel Setting Pacing Pulse Width: 1 ms
MDC IDC LEAD IMPLANT DT: 20131120
MDC IDC LEAD LOCATION: 753858
MDC IDC LEAD LOCATION: 753859
MDC IDC MSMT BATTERY VOLTAGE: 2.94 V
MDC IDC MSMT LEADCHNL LV IMPEDANCE VALUE: 4047 Ohm
MDC IDC MSMT LEADCHNL LV IMPEDANCE VALUE: 532 Ohm
MDC IDC MSMT LEADCHNL LV PACING THRESHOLD PULSEWIDTH: 1 ms
MDC IDC MSMT LEADCHNL RA SENSING INTR AMPL: 0.5 mV
MDC IDC MSMT LEADCHNL RA SENSING INTR AMPL: 0.5 mV
MDC IDC MSMT LEADCHNL RV IMPEDANCE VALUE: 513 Ohm
MDC IDC MSMT LEADCHNL RV IMPEDANCE VALUE: 684 Ohm
MDC IDC MSMT LEADCHNL RV PACING THRESHOLD PULSEWIDTH: 0.4 ms
MDC IDC MSMT LEADCHNL RV SENSING INTR AMPL: 7.75 mV
MDC IDC SESS DTM: 20180621133452
MDC IDC SET LEADCHNL RV PACING PULSEWIDTH: 0.4 ms
MDC IDC SET LEADCHNL RV SENSING SENSITIVITY: 4 mV
MDC IDC STAT BRADY AP VP PERCENT: 0 %
MDC IDC STAT BRADY AS VP PERCENT: 93.12 %

## 2017-01-20 NOTE — Progress Notes (Signed)
HPI [name] [name] returns today for followup. She is a pleasant 61 yo woman with a non-ischemic CM, EF 30-35%, chronic atrial fib with an RVR, s/p AV node ablation and PPM insertion. She has an epicardial LV lead after she developed diaphragmatic stimulation and her CHF symptoms are well compensated. She denies chest pain or sob. No syncope.    No Known Allergies   Current Outpatient Prescriptions  Medication Sig Dispense Refill  . albuterol (PROVENTIL HFA;VENTOLIN HFA) 108 (90 BASE) MCG/ACT inhaler Inhale 2 puffs into the lungs every 6 (six) hours as needed for wheezing or shortness of breath. 1 Inhaler 2  . ALPRAZolam (XANAX) 0.25 MG tablet Take 0.25 mg by mouth 2 (two) times daily as needed for anxiety.    . calcium carbonate (TUMS - DOSED IN MG ELEMENTAL CALCIUM) 500 MG chewable tablet Chew 2 tablets by mouth 3 (three) times daily.    . carvedilol (COREG) 12.5 MG tablet Take 1 tablet (12.5 mg total) by mouth 2 (two) times daily with a meal. 180 tablet 0  . furosemide (LASIX) 20 MG tablet TAKE ONE TABLET BY MOUTH ONCE DAILY - IF  WEIGHT  IS  ABOVE  133(LBS)  THAT  DAY  TAKE  40(MG) 90 tablet 0  . lisinopril (PRINIVIL,ZESTRIL) 5 MG tablet Take 1 tablet (5 mg total) by mouth daily. 90 tablet 1  . magnesium oxide (MAG-OX) 400 (241.3 Mg) MG tablet Take 1 tablet (400 mg total) by mouth daily. 90 tablet 0  . pantoprazole (PROTONIX) 40 MG tablet Take 40 mg by mouth daily.    . rivaroxaban (XARELTO) 20 MG TABS tablet Take 1 tablet (20 mg total) by mouth daily. 90 tablet 1  . sertraline (ZOLOFT) 50 MG tablet Take 50 mg by mouth daily.    Marland Kitchen tiotropium (SPIRIVA HANDIHALER) 18 MCG inhalation capsule INHALE ONE DOSE IN INHALER BY MOUTH ONCE DAILY 90 capsule 0  . zolpidem (AMBIEN) 5 MG tablet Take 5 mg by mouth at bedtime. For sleep     No current facility-administered medications for this visit.      Past Medical History:  Diagnosis Date  . Anemia    2011: Hemoglobin of 11.4; hematocrit of 33.5;  normal MCV  . Anxiety   . Anxiety and depression   . Atrial fibrillation (HCC)    Echocardiogram in 2004-borderline LV function; no valvular abnormalities; 06/2012: AV nodal ablation + biventricular pacemaker  . Cardiac arrest (HCC) 06/2012   16 day hospitalization; caused by hyperkalemia; multiple complications including CHF; permanent atrial fibrillation requiring AV nodal ablation + pacing  . CHF (congestive heart failure) (HCC)   . COPD (chronic obstructive pulmonary disease) (HCC)    heterozygous alpha-1 anti-trypsin deficiency  . Depression   . Dysrhythmia   . GERD (gastroesophageal reflux disease)    protonix  . Nonischemic cardiomyopathy (HCC)    Echo 06/2012: EF 30-35%, moderate to severe MR; cardiac cath 06/2012:30-40% D1 and D2, estimated EF-35%, MR-not graded; right bundle branch block  . Presence of permanent cardiac pacemaker   . Shortness of breath dyspnea   . Thyroid disease   . Urinary frequency     ROS:   All systems reviewed and negative except as noted in the HPI.   Past Surgical History:  Procedure Laterality Date  . AV NODE ABLATION N/A 06/21/2012   Procedure: AV NODE ABLATION;  Surgeon: Marinus Maw, MD;  Location: [facility] CATH LAB;  Service: Cardiovascular;  Laterality: N/A;  . AV NODE ABLATION N/A 06/22/2012  Procedure: AV NODE ABLATION;  Surgeon: Duke Salvia, MD;  Location: [facility] CATH LAB;  Service: Cardiovascular;  Laterality: N/A;  . BREAST BIOPSY     left, [facility]  . BREAST EXCISIONAL BIOPSY     Left x2  . CARDIOVERSION  06/19/2012   Procedure: CARDIOVERSION;  Surgeon: Vesta Mixer, MD;  Location: [facility] ENDOSCOPY;  Service: Cardiovascular;;  . CARDIOVERSION  06/21/2012   Procedure: CARDIOVERSION;  Surgeon: Cassell Clement, MD;  Location: [facility] OR;  Service: Cardiovascular;  Laterality: N/A;  . CERVICAL CONIZATION W/BX  08/11/2011   Procedure: CONIZATION CERVIX WITH BIOPSY;  Surgeon: Lazaro Arms, MD;  Location: [facility] ORS;  Service: Gynecology;  Laterality:  N/A;  Laser Conization of Cervix  . EPICARDIAL PACING LEAD PLACEMENT N/A 02/28/2015   Procedure: LV EPICARDIAL PACING LEAD PLACEMENT;  Surgeon: Alleen Borne, MD;  Location: [facility] OR;  Service: Thoracic;  Laterality: N/A;  . INSERT / REPLACE / REMOVE PACEMAKER    . LEFT HEART CATHETERIZATION WITH CORONARY ANGIOGRAM N/A 06/15/2012   Procedure: LEFT HEART CATHETERIZATION WITH CORONARY ANGIOGRAM;  Surgeon: Herby Abraham, MD;  Location: [facility] CATH LAB;  Service: Cardiovascular;  Laterality: N/A;  . PERMANENT PACEMAKER INSERTION N/A 06/21/2012   Procedure: PERMANENT PACEMAKER INSERTION;  Surgeon: Marinus Maw, MD;  Location: [facility] CATH LAB;  Service: Cardiovascular;  Laterality: N/A;  . TEE WITHOUT CARDIOVERSION  06/19/2012   Procedure: TRANSESOPHAGEAL ECHOCARDIOGRAM (TEE);  Surgeon: Vesta Mixer, MD;  Location: [facility] ENDOSCOPY;  Service: Cardiovascular;  Laterality: N/A;  . THORACOTOMY Left 02/28/2015   Procedure: THORACOTOMY MAJOR;  Surgeon: Alleen Borne, MD;  Location[facility] OR;  Service: Thoracic;  Laterality: Left;     Family History  Problem Relation Age of Onset  . Cancer Father        carcinoma of the lung  . Cervical cancer Mother   . Cancer Sister        carcinoma of the lung  . Irritable bowel syndrome Unknown        mother, sister  . Anesthesia problems Neg Hx   . Hypotension Neg Hx   . Malignant hyperthermia Neg Hx   . Pseudochol deficiency Neg Hx   . Colon cancer Neg Hx   . Celiac disease Neg Hx   . Inflammatory bowel disease Neg Hx      Social History   Social History  . Marital status: Divorced    Spouse name: N/A  . Number of children: 2  . Years of education: N/A   Occupational History  . Warehouse manager  . 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 Smoking status: Never Smoker  . Smokeless tobacco: Never Used  . Alcohol use No  . Drug use: No  . Sexual activity: Not on file   Other Topics Concern  . Not on file    Social History Narrative   ** Merged History Encounter **       ** Data from: 06/22/12 Enc Dept: [facility]       ** Data from: 06/05/12 Enc Dept: [facility]   Lives alone with good family support from sisters.            BP 106/66   Pulse 68   Ht 5\' 4"  (1.626 m)   Wt 139 lb (63 kg)   SpO2 94%   BMI 23.86 kg/m   Physical Exam:  Well appearing middle aged woman,  NAD HEENT: Unremarkable Neck:  7 cm JVD, no thyromegally Back:  No CVA tenderness Lungs:  Clear with no wheezes or rhonchi. Minimal rales. Well healed PPM insertion. HEART:  Regular rate rhythm, no murmurs, no rubs, no clicks Abd:  soft, positive bowel sound,s, no organomegally, no rebound, no guarding Ext:  2 plus pulses, no edema, no cyanosis, no clubbing Skin:  No rashes no nodules Neuro:  CN II through XII intact, motor grossly intact  DEVICE  Normal device function.  See PaceArt for details.  Assess/Plan: 1. Atrial fib - her ventricular rate is well controlled. No change in meds. 2. Chronic systolic heart failure - her symptoms are class 2 and she has had no exacerbation. She is encouraged to maintain a low sodium diet. 3. MR - by echo, her MR is only mild/moderate. She will undergo watchful waiting 4. BiV PPM - her St. Jude BiV PM is working normally. She is s/p epicardial LV lead insertion. She has about 2 years of longevity on her device.  Leonia Reeves.D.

## 2017-01-20 NOTE — Patient Instructions (Signed)
Medication Instructions:    Your physician recommends that you continue on your current medications as directed. Please refer to the Current Medication list given to you today.  --- If you need a refill on your cardiac medications before your next appointment, please call your pharmacy. ---  Labwork:  None ordered  Testing/Procedures:  None ordered  Follow-Up: Remote monitoring is used to monitor your Pacemaker of ICD from home. This monitoring reduces the number of office visits required to check your device to one time per year. It allows us to keep an eye on the functioning of your device to ensure it is working properly. You are scheduled for a device check from home on 04/21/2017. You may send your transmission at any time that day. If you have a wireless device, the transmission will be sent automatically. After your physician reviews your transmission, you will receive a postcard with your next transmission date.   Your physician wants you to follow-up in: 1 year with Dr. Taylor.  You will receive a reminder letter in the mail two months in advance. If you don't receive a letter, please call our office to schedule the follow-up appointment.  Thank you for choosing [facility]!!      

## 2017-02-22 ENCOUNTER — Ambulatory Visit (INDEPENDENT_AMBULATORY_CARE_PROVIDER_SITE_OTHER): Payer: Medicare Other

## 2017-02-22 ENCOUNTER — Telehealth: Payer: Self-pay | Admitting: Cardiology

## 2017-02-22 DIAGNOSIS — Z950 Presence of cardiac pacemaker: Secondary | ICD-10-CM

## 2017-02-22 DIAGNOSIS — I5022 Chronic systolic (congestive) heart failure: Secondary | ICD-10-CM

## 2017-02-22 NOTE — Progress Notes (Signed)
EPIC Encounter for ICM Monitoring  Patient Name: [name] [name] is a 61 y.o. female Date: 02/22/2017 Primary Care Physican: Kari Baars, MD Primary Cardiologist: Wyline Mood Electrophysiologist: Ladona Ridgel Dry Weight:140lbs  Bi-V Pacing: 94.9%  Clinical Status Since 20-Jan-2017 Longest Treated AT/AF(Monitor) Monitored VT (>4 beats) 42 and longest 11 sec Fast A&V  0 AT/AF 1 and longest 22 month Time in AT/AF <0.1 hr/day (<0.1%) OBSERVATIONS (2)  1 days with more than 6 hr AT/AF.  4 monitored VT episodes, longest was 11 sec.      Heart Failure questions reviewed, pt symptomatic with weight gain from 138 lbs to 142 lbs but thinks it has decreased.  Explained importance of daily weight checks at same time with same clothes.     Thoracic impedance abnormal suggesting fluid accumulation but is trending back to baseline.  Patient has not been taking Furosemide daily and for over a week she took it every other day.  Advised to take as prescribed which is daily.  Explained this the weight gain is probably r/t to not taking it daily.   Prescribed dosage: Furosemide 20 mg daily and may take extra tablet for total of 40 mg if weight is > 133 lbs  Recommendations:  Advised to take the fluid pill as prescribed since her fluids appear to be balancing out.   Follow-up plan: ICM clinic phone appointment on 02/28/2017 to recheck fluid levels. Office appointment scheduled 04/29/2017 with Dr. Wyline Mood.  Copy of ICM check sent to Dr Wyline Mood and Dr Ladona Ridgel and if any recommendations will call her.    3 month ICM trend: 02/22/2017   1 Year ICM trend:      [name], RN 02/22/2017 1:33 PM

## 2017-02-22 NOTE — Telephone Encounter (Signed)
Spoke with pt and reminded pt of remote transmission that is due today. Pt verbalized understanding.   

## 2017-02-28 ENCOUNTER — Ambulatory Visit (INDEPENDENT_AMBULATORY_CARE_PROVIDER_SITE_OTHER): Payer: Self-pay

## 2017-02-28 ENCOUNTER — Telehealth: Payer: Self-pay | Admitting: Cardiology

## 2017-02-28 DIAGNOSIS — I5022 Chronic systolic (congestive) heart failure: Secondary | ICD-10-CM

## 2017-02-28 DIAGNOSIS — Z950 Presence of cardiac pacemaker: Secondary | ICD-10-CM

## 2017-02-28 NOTE — Telephone Encounter (Signed)
LMOVM reminding pt to send remote transmission.   

## 2017-03-01 NOTE — Progress Notes (Signed)
EPIC Encounter for ICM Monitoring  Patient Name: [name] [name] is a 61 y.o. female Date: 03/01/2017 Primary Care Physican: Kari Baars, MD Primary Cardiologist: Wyline Mood Electrophysiologist: Ladona Ridgel Dry Weight:140lbs  Bi-V Pacing: 94.5%   Since 22-Feb-2017 VT (>4 beats) 7 beats and longest 11 sec OBSERVATIONS (1)  1 monitored VT episodes, longest was 11 sec.    Heart Failure questions reviewed, weight stable at 140 lbs.   Thoracic impedance has returned close to baseline.  Prescribed dosage: Furosemide 20 mg daily and may take extra tablet for total of 40 mgif weight is >133 lbs  Recommendations: Patient has been taking Furosemide 20 mg daily as prescribed.  No changes today.   Follow-up plan: ICM clinic phone appointment on 03/25/2017.    Copy of ICM check sent to primary cardiologist and device physician.   3 month ICM trend: 03/01/2017   1 Year ICM trend:      [name], RN 03/01/2017 3:38 PM

## 2017-03-20 ENCOUNTER — Other Ambulatory Visit: Payer: Self-pay | Admitting: Cardiology

## 2017-03-25 ENCOUNTER — Ambulatory Visit (INDEPENDENT_AMBULATORY_CARE_PROVIDER_SITE_OTHER): Payer: Medicare Other

## 2017-03-25 ENCOUNTER — Telehealth: Payer: Self-pay

## 2017-03-25 DIAGNOSIS — I5022 Chronic systolic (congestive) heart failure: Secondary | ICD-10-CM

## 2017-03-25 DIAGNOSIS — Z950 Presence of cardiac pacemaker: Secondary | ICD-10-CM | POA: Diagnosis not present

## 2017-03-25 NOTE — Telephone Encounter (Signed)
LMOVM requesting that pt send manual transmission 

## 2017-03-29 ENCOUNTER — Telehealth: Payer: Self-pay

## 2017-03-29 NOTE — Telephone Encounter (Signed)
Received call from patient and asked if her pacemaker report was received and there is no transmission for this week.  She attempted to resend today but transmission is not coming through.  Advised to call [facility] Service number and she stated she has the number.

## 2017-03-29 NOTE — Progress Notes (Signed)
EPIC Encounter for ICM Monitoring  Patient Name: [name] [name] is a 61 y.o. female Date: 03/29/2017 Primary Care Physican: Kari Baars, MD mary Cardiologist: Wyline Mood Electrophysiologist: Ladona Ridgel Dry Weight:140lbs  Bi-V Pacing: 95%       Heart Failure questions reviewed. Patient has been having gout in the last week and she missed a couple of dosages of Furosemide   Thoracic impedance abnormal suggesting fluid accumulation since 03/17/2017 but has improved and close to baseline.  Prescribed dosage: Furosemide 20 mg daily and may take extra tablet for total of 40 mgif weight is >133 lbs  Recommendations: Advised to limit salt intake to 2000 mg/day and if she has fluid symptoms to take the extra Furosemide as prescribed if needed.  Encouraged to call for fluid symptoms.  Follow-up plan: ICM clinic phone appointment on 04/25/2017.  Office appointment scheduled 05/05/2017 with Dr. Wyline Mood.  Copy of ICM check sent to Dr. Ladona Ridgel and Dr. Wyline Mood.   3 month ICM trend: 03/29/2017  AT/AF   1 Year ICM trend:      [name], RN 03/29/2017 4:51 PM

## 2017-03-31 DIAGNOSIS — H25813 Combined forms of age-related cataract, bilateral: Secondary | ICD-10-CM | POA: Diagnosis not present

## 2017-04-12 DIAGNOSIS — H25012 Cortical age-related cataract, left eye: Secondary | ICD-10-CM | POA: Diagnosis not present

## 2017-04-12 DIAGNOSIS — H2512 Age-related nuclear cataract, left eye: Secondary | ICD-10-CM | POA: Diagnosis not present

## 2017-04-12 DIAGNOSIS — H25013 Cortical age-related cataract, bilateral: Secondary | ICD-10-CM | POA: Diagnosis not present

## 2017-04-12 DIAGNOSIS — H2513 Age-related nuclear cataract, bilateral: Secondary | ICD-10-CM | POA: Diagnosis not present

## 2017-04-25 ENCOUNTER — Ambulatory Visit (INDEPENDENT_AMBULATORY_CARE_PROVIDER_SITE_OTHER): Payer: Medicare Other | Admitting: *Deleted

## 2017-04-25 ENCOUNTER — Telehealth: Payer: Self-pay | Admitting: Cardiology

## 2017-04-25 DIAGNOSIS — I5022 Chronic systolic (congestive) heart failure: Secondary | ICD-10-CM

## 2017-04-25 DIAGNOSIS — Z950 Presence of cardiac pacemaker: Secondary | ICD-10-CM | POA: Diagnosis not present

## 2017-04-25 DIAGNOSIS — I428 Other cardiomyopathies: Secondary | ICD-10-CM

## 2017-04-25 NOTE — Telephone Encounter (Signed)
Spoke with pt and reminded pt of remote transmission that is due today. Pt verbalized understanding.   

## 2017-04-25 NOTE — Progress Notes (Signed)
EPIC Encounter for ICM Monitoring  Patient Name: [name] [name] is a 61 y.o. female Date: 04/25/2017 Primary Care Physican: Kari Baars, MD Primary Cardiologist: Branch Electrophysiologist: Ladona Ridgel Dry Weight:143lbs  Bi-V Pacing: 94.2%  Since 29-Mar-2017: Monitored VT (>4 beats) 61 Longest: 16 sec  Event Summary ?4 Monitored VT  ?31 V. Sensing Episodes  ?58 VT-NS            Heart Failure questions reviewed, pt symptomatic with swelling around the ankle and will discuss with Dr Wyline Mood at office visit next week.   Thoracic impedance close to baseline.  Prescribed dosage: Furosemide 20 mg daily and may take extra tablet for total of 40 mgif weight is >133 lbs  Recommendations: She is taking extra Furosemide yesterday and today.    Follow-up plan: ICM clinic phone appointment on 05/26/2017.  Office appointment scheduled 05/06/2017 with Dr. Wyline Mood.  Copy of ICM check sent to Dr. Ladona Ridgel.   3 month ICM trend: 04/25/2017   1 Year ICM trend:      [name], RN 04/25/2017 2:20 PM

## 2017-04-26 LAB — CUP PACEART REMOTE DEVICE CHECK
Battery Remaining Longevity: 33 mo
Battery Voltage: 2.94 V
Brady Statistic RA Percent Paced: 0 %
Implantable Lead Location: 753859
Implantable Lead Location: 753860
Implantable Lead Model: 4196
Implantable Lead Model: 5076
Implantable Lead Model: 5076
Implantable Pulse Generator Implant Date: 20131120
Lead Channel Impedance Value: 380 Ohm
Lead Channel Impedance Value: 4047 Ohm
Lead Channel Impedance Value: 456 Ohm
Lead Channel Impedance Value: 513 Ohm
Lead Channel Impedance Value: 608 Ohm
Lead Channel Pacing Threshold Amplitude: 0.5 V
Lead Channel Pacing Threshold Amplitude: 2 V
Lead Channel Pacing Threshold Pulse Width: 0.4 ms
Lead Channel Pacing Threshold Pulse Width: 1 ms
Lead Channel Sensing Intrinsic Amplitude: 0.5 mV
Lead Channel Setting Pacing Amplitude: 3 V
Lead Channel Setting Pacing Pulse Width: 0.4 ms
Lead Channel Setting Sensing Sensitivity: 4 mV
MDC IDC LEAD IMPLANT DT: 20131120
MDC IDC LEAD IMPLANT DT: 20131120
MDC IDC LEAD IMPLANT DT: 20131120
MDC IDC LEAD LOCATION: 753858
MDC IDC MSMT LEADCHNL LV IMPEDANCE VALUE: 342 Ohm
MDC IDC MSMT LEADCHNL LV IMPEDANCE VALUE: 4047 Ohm
MDC IDC MSMT LEADCHNL LV IMPEDANCE VALUE: 4047 Ohm
MDC IDC MSMT LEADCHNL LV IMPEDANCE VALUE: 513 Ohm
MDC IDC MSMT LEADCHNL RA SENSING INTR AMPL: 0.5 mV
MDC IDC MSMT LEADCHNL RV SENSING INTR AMPL: 8.75 mV
MDC IDC MSMT LEADCHNL RV SENSING INTR AMPL: 8.75 mV
MDC IDC SESS DTM: 20180925002902
MDC IDC SET LEADCHNL LV PACING PULSEWIDTH: 1 ms
MDC IDC SET LEADCHNL RV PACING AMPLITUDE: 2 V
MDC IDC STAT BRADY AP VP PERCENT: 0 %
MDC IDC STAT BRADY AP VS PERCENT: 0 %
MDC IDC STAT BRADY AS VP PERCENT: 0 %
MDC IDC STAT BRADY AS VS PERCENT: 0 %
MDC IDC STAT BRADY RV PERCENT PACED: 94.17 %

## 2017-04-26 NOTE — Progress Notes (Signed)
Remote pacemaker transmission.   

## 2017-04-28 ENCOUNTER — Encounter: Payer: Self-pay | Admitting: Cardiology

## 2017-04-29 ENCOUNTER — Ambulatory Visit: Payer: Self-pay | Admitting: Cardiology

## 2017-05-05 ENCOUNTER — Ambulatory Visit: Payer: Self-pay | Admitting: Cardiology

## 2017-05-06 ENCOUNTER — Encounter: Payer: Self-pay | Admitting: Cardiology

## 2017-05-06 ENCOUNTER — Ambulatory Visit (INDEPENDENT_AMBULATORY_CARE_PROVIDER_SITE_OTHER): Payer: Medicare Other | Admitting: Cardiology

## 2017-05-06 VITALS — BP 120/72 | HR 89 | Ht 64.0 in | Wt 144.0 lb

## 2017-05-06 DIAGNOSIS — E785 Hyperlipidemia, unspecified: Secondary | ICD-10-CM

## 2017-05-06 DIAGNOSIS — Z79899 Other long term (current) drug therapy: Secondary | ICD-10-CM | POA: Diagnosis not present

## 2017-05-06 DIAGNOSIS — I340 Nonrheumatic mitral (valve) insufficiency: Secondary | ICD-10-CM | POA: Diagnosis not present

## 2017-05-06 DIAGNOSIS — I4891 Unspecified atrial fibrillation: Secondary | ICD-10-CM | POA: Diagnosis not present

## 2017-05-06 DIAGNOSIS — I5022 Chronic systolic (congestive) heart failure: Secondary | ICD-10-CM

## 2017-05-06 MED ORDER — ALBUTEROL SULFATE HFA 108 (90 BASE) MCG/ACT IN AERS: 2.0000 | INHALATION_SPRAY | Freq: Four times a day (QID) | RESPIRATORY_TRACT | 2 refills | Status: DC | PRN

## 2017-05-06 MED ORDER — FUROSEMIDE 20 MG PO TABS: ORAL_TABLET | ORAL | 6 refills | Status: DC

## 2017-05-06 NOTE — Patient Instructions (Addendum)
Medication Instructions:  Your physician recommends that you continue on your current medications as directed. Please refer to the Current Medication list given to you today.  INCREASE LASIX TO 40 MG DAILY, IF WEIGHT IS ABOVE 146 LBS YOU MAY TAKE AN ADDITIONAL 20 MG   Labwork: ASAP   Testing/Procedures: NONE  Follow-Up: Your physician wants you to follow-up in: 6 MONTHS.  You will receive a reminder letter in the mail two months in advance. If you don't receive a letter, please call our office to schedule the follow-up appointment.   Any Other Special Instructions Will Be Listed Below (If Applicable).     If you need a refill on your cardiac medications before your next appointment, please call your pharmacy.

## 2017-05-06 NOTE — Progress Notes (Signed)
Clinical Summary [name] [name] is a 61 y.o.female seen today for follow up of the following medical problems.   1. NICM  - echo 10/2013 LVEF 30-35%, restrictive diastolic dysfunction - cath Jan 2011 showed no obstructive CAD  - repeat echo 10/2015 LVEF 45-50%.    - occasional SOB that is overall stable. No recent edema - compliant with meds.    2. Chronic afib  - prior AV nodal ablation with permanent pacemaker   - denies any palpitations. No bleeding on xarelto.   3. Moderate MR - stable chroni SOB at times. No significant LE edema  4. Pacemaker - she has a Medtronic BiV pacemaker followed by Dr Ladona Ridgel, LV lead replaced epicardial by Dr Laneta Simmers 01/2015  - no recent symptoms - normal function by last check.   5. COPD - management per Dr Juanetta Gosling.  - appt coming up soon.   6. HTN - she is compliant with meds   Labs pending with pcp  Past Medical History:  Diagnosis Date  . Anemia    2011: Hemoglobin of 11.4; hematocrit of 33.5; normal MCV  . Anxiety   . Anxiety and depression   . Atrial fibrillation (HCC)    Echocardiogram in 2004-borderline LV function; no valvular abnormalities; 06/2012: AV nodal ablation + biventricular pacemaker  . Cardiac arrest (HCC) 06/2012   16 day hospitalization; caused by hyperkalemia; multiple complications including CHF; permanent atrial fibrillation requiring AV nodal ablation + pacing  . CHF (congestive heart failure) (HCC)   . COPD (chronic obstructive pulmonary disease) (HCC)    heterozygous alpha-1 anti-trypsin deficiency  . Depression   . Dysrhythmia   . GERD (gastroesophageal reflux disease)    protonix  . Nonischemic cardiomyopathy (HCC)    Echo 06/2012: EF 30-35%, moderate to severe MR; cardiac cath 06/2012:30-40% D1 and D2, estimated EF-35%, MR-not graded; right bundle Bryan Goin block  . Presence of permanent cardiac pacemaker   . Shortness of breath dyspnea   . Thyroid disease   . Urinary frequency       No Known Allergies   Current Outpatient Prescriptions  Medication Sig Dispense Refill  . albuterol (PROVENTIL HFA;VENTOLIN HFA) 108 (90 BASE) MCG/ACT inhaler Inhale 2 puffs into the lungs every 6 (six) hours as needed for wheezing or shortness of breath. 1 Inhaler 2  . ALPRAZolam (XANAX) 0.25 MG tablet Take 0.25 mg by mouth 2 (two) times daily as needed for anxiety.    . calcium carbonate (TUMS - DOSED IN MG ELEMENTAL CALCIUM) 500 MG chewable tablet Chew 2 tablets by mouth 3 (three) times daily.    . carvedilol (COREG) 12.5 MG tablet Take 1 tablet (12.5 mg total) by mouth 2 (two) times daily with a meal. 180 tablet 0  . carvedilol (COREG) 12.5 MG tablet TAKE ONE TABLET BY MOUTH TWICE DAILY WITH  MEALS 60 tablet 6  . furosemide (LASIX) 20 MG tablet TAKE ONE TABLET BY MOUTH ONCE DAILY - IF  WEIGHT  IS  ABOVE  133(LBS)  THAT  DAY  TAKE  40(MG) 90 tablet 0  . lisinopril (PRINIVIL,ZESTRIL) 5 MG tablet Take 1 tablet (5 mg total) by mouth daily. 90 tablet 1  . magnesium oxide (MAG-OX) 400 (241.3 Mg) MG tablet Take 1 tablet (400 mg total) by mouth daily. 90 tablet 0  . pantoprazole (PROTONIX) 40 MG tablet Take 40 mg by mouth daily.    . rivaroxaban (XARELTO) 20 MG TABS tablet Take 1 tablet (20 mg total) by mouth daily. 90  tablet 1  . sertraline (ZOLOFT) 50 MG tablet Take 50 mg by mouth daily.    Marland Kitchen tiotropium (SPIRIVA HANDIHALER) 18 MCG inhalation capsule INHALE ONE DOSE IN INHALER BY MOUTH ONCE DAILY 90 capsule 0  . zolpidem (AMBIEN) 5 MG tablet Take 5 mg by mouth at bedtime. For sleep     No current facility-administered medications for this visit.      Past Surgical History:  Procedure Laterality Date  . AV NODE ABLATION N/A 06/21/2012   Procedure: AV NODE ABLATION;  Surgeon: Marinus Maw, MD;  Location: [facility] CATH LAB;  Service: Cardiovascular;  Laterality: N/A;  . AV NODE ABLATION N/A 06/22/2012   Procedure: AV NODE ABLATION;  Surgeon: Duke Salvia, MD;  Location: [facility] CATH LAB;   Service: Cardiovascular;  Laterality: N/A;  . BREAST BIOPSY     left, [facility]  . BREAST EXCISIONAL BIOPSY     Left x2  . CARDIOVERSION  06/19/2012   Procedure: CARDIOVERSION;  Surgeon: Vesta Mixer, MD;  Location: [facility] ENDOSCOPY;  Service: Cardiovascular;;  . CARDIOVERSION  06/21/2012   Procedure: CARDIOVERSION;  Surgeon: Cassell Clement, MD;  Location: [facility] OR;  Service: Cardiovascular;  Laterality: N/A;  . CERVICAL CONIZATION W/BX  08/11/2011   Procedure: CONIZATION CERVIX WITH BIOPSY;  Surgeon: Lazaro Arms, MD;  Location: [facility] ORS;  Service: Gynecology;  Laterality: N/A;  Laser Conization of Cervix  . EPICARDIAL PACING LEAD PLACEMENT N/A 02/28/2015   Procedure: LV EPICARDIAL PACING LEAD PLACEMENT;  Surgeon: Alleen Borne, MD;  Location: [facility] OR;  Service: Thoracic;  Laterality: N/A;  . INSERT / REPLACE / REMOVE PACEMAKER    . LEFT HEART CATHETERIZATION WITH CORONARY ANGIOGRAM N/A 06/15/2012   Procedure: LEFT HEART CATHETERIZATION WITH CORONARY ANGIOGRAM;  Surgeon: Herby Abraham, MD;  Location: [facility] CATH LAB;  Service: Cardiovascular;  Laterality: N/A;  . PERMANENT PACEMAKER INSERTION N/A 06/21/2012   Procedure: PERMANENT PACEMAKER INSERTION;  Surgeon: Marinus Maw, MD;  Location: [facility] CATH LAB;  Service: Cardiovascular;  Laterality: N/A;  . TEE WITHOUT CARDIOVERSION  06/19/2012   Procedure: TRANSESOPHAGEAL ECHOCARDIOGRAM (TEE);  Surgeon: Vesta Mixer, MD;  Location: [facility] ENDOSCOPY;  Service: Cardiovascular;  Laterality: N/A;  . THORACOTOMY Left 02/28/2015   Procedure: THORACOTOMY MAJOR;  Surgeon: Alleen Borne, MD;  Location: [facility] OR;  Service: Thoracic;  Laterality: Left;     No Known Allergies    Family History  Problem Relation Age of Onset  . Cancer Father        carcinoma of the lung  . Cervical cancer Mother   . Cancer Sister        carcinoma of the lung  . Irritable bowel syndrome Unknown        mother, sister  . Anesthesia problems Neg Hx   . Hypotension Neg Hx   .  Malignant hyperthermia Neg Hx   . Pseudochol deficiency Neg Hx   . Colon cancer Neg Hx   . Celiac disease Neg Hx   . Inflammatory bowel disease Neg Hx      Social History Ms. Beauman reports that she has never smoked. She has never used smokeless tobacco. Ms. Dare reports that she does not drink alcohol.   Review of Systems CONSTITUTIONAL: No weight loss, fever, chills, weakness or fatigue.  HEENT: Eyes: No visual loss, blurred vision, double vision or yellow sclerae.No hearing loss, sneezing, congestion, runny nose or sore throat.  SKIN: No rash or itching.  CARDIOVASCULAR: per hpi RESPIRATORY: No shortness  of breath, cough or sputum.  GASTROINTESTINAL: No anorexia, nausea, vomiting or diarrhea. No abdominal pain or blood.  GENITOURINARY: No burning on urination, no polyuria NEUROLOGICAL: No headache, dizziness, syncope, paralysis, ataxia, numbness or tingling in the extremities. No change in bowel or bladder control.  MUSCULOSKELETAL: No muscle, back pain, joint pain or stiffness.  LYMPHATICS: No enlarged nodes. No history of splenectomy.  PSYCHIATRIC: No history of depression or anxiety.  ENDOCRINOLOGIC: No reports of sweating, cold or heat intolerance. No polyuria or polydipsia.  Marland Kitchen   Physical Examination Vitals:   05/06/17 0850  BP: 120/72  Pulse: 89  SpO2: 97%   Vitals:   05/06/17 0850  Weight: 144 lb (65.3 kg)  Height:  (1.626 m)    Gen: resting comfortably, no acute distress HEENT: no scleral icterus, pupils equal round and reactive, no palptable cervical adenopathy,  CV: RRR, no m/r/g, no jvd Resp: Clear to auscultation bilaterally GI: abdomen is soft, non-tender, non-distended, normal bowel sounds, no hepatosplenomegaly MSK: extremities are warm, no edema.  Skin: warm, no rash Neuro:  no focal deficits Psych: appropriate affect   Diagnostic Studies 06/2012 TEE: mod to severe MR, milld to mod TR   06/12/12 Cath Hemodynamics:  AO 118/68 (83)   LV 119/8  Cannot calculate gradient due to rapid atrial fib  Coronary angiography:  Coronary dominance: right  Left mainstem: Short without significant disease  Left anterior descending (LAD): There is calcification proximally. There are two major diagonal branches. Between the first and second diagonal branches are approximately 30-40% plaquing. The distal LAD has mild irregularity. Both diagonals have minimal irregularity.  Left circumflex (LCx): Provides one large bifurcation marginal Kseniya Grunden and a small AV circumflex. There is minor distal irregularity. The AV circ is small  Right coronary artery (RCA): Provides a large PDA and a large and smaller PLA branches. No significant focal obstruction.  Left ventriculography: Due to ectopy, EF cannot be calculated. With AF, difficult to be sure of function but appears 35%. There does appear to be some MR.  Final Conclusions:  1. Atrial fib with rapid ventricular response.  2. Mild calcification of proximal LAD without critical obstruction. No high grade coronary artery disease.  09/2012 Echo: LVEF 25%, severely dilated LV, mild LVH, mild AI, mild MR   09/26/13 Clinic EKG Afib, V-paced  11/08/13 Echo Study Conclusions  - Left ventricle: The cavity size was moderately dilated. Wall thickness was increased in a pattern of mild LVH. Systolic function was moderately to severely reduced. The estimated ejection fraction was in the range of 30% to 35%. Diffuse hypokinesis. There is severe hypokinesis of the anteroseptal myocardium. Doppler parameters are consistent with restrictive physiology, indicative of decreased left ventricular diastolic compliance and/or increased left atrial pressure. - Ventricular septum: Septal motion showed abnormal function and dyssynergy. - Aortic valve: Trileaflet; mildly thickened leaflets. Mild to moderate regurgitation. - Mitral valve: Mildly thickened leaflets . Moderate regurgitation, appears to  be made up of two jets. Suboptimal PISA. - Left atrium: The atrium was severely dilated. - Right ventricle: The cavity size was mildly dilated. Pacer wire or catheter noted in right ventricle. - Right atrium: The atrium was moderately to severely dilated. - Tricuspid valve: Mild-moderate regurgitation. - Pulmonic valve: Mild regurgitation. - Pulmonary arteries: Systolic pressure was moderately increased. - Pericardium, extracardiac: There was no pericardial effusion. Impressions:  - Mild LVH with moderate chamber dilatation and LVEF 30-35% with diffuse hypokinesis, most prominent anteroseptal wall in setting of septal dyssynergy and pacing. Restrictive  diastolic filling pattern with increased filling pressures. Severe left atrial and moderate to severe right atrial enlargement. MIldly thickened mitral valve with overall moderate mitral regurgitation made up of two jets (PISA suboptimal). Mild to moderate aortic regurgitation. Device wire in right heart. MIld to moderate tricuspid regurgitation with PASP 51 mmHg.   10/2015 echo Study Conclusions  - Left ventricle: The cavity size was normal. Systolic function was mildly reduced. The estimated ejection fraction was approximately 45%. Diffuse hypokinesis. Diastolic dysfunction, indeterminate grade and filling pressures. Mild septal thickening. - Aortic valve: Mildly to moderately calcified annulus. Trileaflet. There was mild to moderate regurgitation. - Mitral valve: There was moderate regurgitation. - Left atrium: The atrium was severely dilated. - Right ventricle: Pacer wire or catheter noted in right ventricle. - Right atrium: The atrium was mildly to moderately dilated. - Tricuspid valve: There was mild regurgitation. - Pulmonic valve: There was mild regurgitation.    Assessment and Plan   1. NICM/Chronic systolic HF - no significant symptoms, continue current meds. She has been taking lasix  daily at  home, we will update her prescription.   2. Afib  - hx of av nodal ablation with pacemaker  - CHADS2Vasc score is 4, she will continue anticoag no recent symptoms, continue current meds  3. Mitral regurgitation  - moderate to severe by TEE 06/2012, follow up TTE's have been stable mild to moderate. No current symptoms, likely functional MR related to her LV systolic dysfunction.   - we will continue to monitor at this time   4. Pacemaker - she will continue to follow with device clinic   5. COPD - followed by Dr Juanetta Gosling.   6. HTN - at goal, continue current meds   F/u 6months.     [name], M.D., F.A.C.C.

## 2017-05-16 DIAGNOSIS — E785 Hyperlipidemia, unspecified: Secondary | ICD-10-CM | POA: Diagnosis not present

## 2017-05-16 DIAGNOSIS — Z79899 Other long term (current) drug therapy: Secondary | ICD-10-CM | POA: Diagnosis not present

## 2017-05-16 DIAGNOSIS — I4891 Unspecified atrial fibrillation: Secondary | ICD-10-CM | POA: Diagnosis not present

## 2017-05-17 ENCOUNTER — Telehealth: Payer: Self-pay

## 2017-05-17 LAB — CBC WITH DIFFERENTIAL/PLATELET
BASOS ABS: 21 {cells}/uL (ref 0–200)
Basophils Relative: 0.4 %
EOS PCT: 1.7 %
Eosinophils Absolute: 88 cells/uL (ref 15–500)
HEMATOCRIT: 32 % — AB (ref 35.0–45.0)
HEMOGLOBIN: 10.4 g/dL — AB (ref 11.7–15.5)
LYMPHS ABS: 1196 {cells}/uL (ref 850–3900)
MCH: 30.1 pg (ref 27.0–33.0)
MCHC: 32.5 g/dL (ref 32.0–36.0)
MCV: 92.5 fL (ref 80.0–100.0)
MPV: 12.1 fL (ref 7.5–12.5)
Monocytes Relative: 12.6 %
NEUTROS ABS: 3240 {cells}/uL (ref 1500–7800)
Neutrophils Relative %: 62.3 %
Platelets: 177 10*3/uL (ref 140–400)
RBC: 3.46 10*6/uL — AB (ref 3.80–5.10)
RDW: 14 % (ref 11.0–15.0)
Total Lymphocyte: 23 %
WBC mixed population: 655 cells/uL (ref 200–950)
WBC: 5.2 10*3/uL (ref 3.8–10.8)

## 2017-05-17 LAB — LIPID PANEL
Cholesterol: 156 mg/dL (ref <200)
HDL: 38 mg/dL — ABNORMAL LOW (ref >50)
LDL Cholesterol (Calc): 93 mg/dL (calc)
NON-HDL CHOLESTEROL (CALC): 118 mg/dL (ref <130)
TRIGLYCERIDES: 149 mg/dL (ref <150)
Total CHOL/HDL Ratio: 4.1 (calc) (ref <5.0)

## 2017-05-17 LAB — COMPREHENSIVE METABOLIC PANEL
AG RATIO: 1.6 (calc) (ref 1.0–2.5)
ALBUMIN MSPROF: 4.3 g/dL (ref 3.6–5.1)
ALT: 16 U/L (ref 6–29)
AST: 20 U/L (ref 10–35)
Alkaline phosphatase (APISO): 105 U/L (ref 33–130)
BILIRUBIN TOTAL: 0.8 mg/dL (ref 0.2–1.2)
BUN: 19 mg/dL (ref 7–25)
CALCIUM: 9.9 mg/dL (ref 8.6–10.4)
CHLORIDE: 105 mmol/L (ref 98–110)
CO2: 28 mmol/L (ref 20–32)
Creat: 0.97 mg/dL (ref 0.50–0.99)
GLOBULIN: 2.7 g/dL (ref 1.9–3.7)
GLUCOSE: 91 mg/dL (ref 65–139)
POTASSIUM: 4.2 mmol/L (ref 3.5–5.3)
SODIUM: 141 mmol/L (ref 135–146)
TOTAL PROTEIN: 7 g/dL (ref 6.1–8.1)

## 2017-05-17 LAB — TSH: TSH: 0.81 m[IU]/L (ref 0.40–4.50)

## 2017-05-17 LAB — MAGNESIUM: MAGNESIUM: 1.6 mg/dL (ref 1.5–2.5)

## 2017-05-17 LAB — HEMOGLOBIN A1C
HEMOGLOBIN A1C: 5.6 %{Hb} (ref <5.7)
Mean Plasma Glucose: 114 (calc)
eAG (mmol/L): 6.3 (calc)

## 2017-05-17 NOTE — Telephone Encounter (Signed)
Called pt., no answer. Left detailed message on pt's private voicemail.  

## 2017-05-17 NOTE — Telephone Encounter (Signed)
-----   Message from [name], MD sent at 05/17/2017  1:46 PM EDT ----- Labs look good  Dominga Ferry MD

## 2017-05-26 ENCOUNTER — Telehealth: Payer: Self-pay | Admitting: Cardiology

## 2017-05-26 NOTE — Telephone Encounter (Signed)
Spoke with pt and reminded pt of remote transmission that is due today. Pt verbalized understanding.   

## 2017-05-27 NOTE — Progress Notes (Signed)
No ICM remote transmission received for 05/26/2017 and next ICM transmission scheduled for 06/16/2017.

## 2017-05-30 DIAGNOSIS — Z950 Presence of cardiac pacemaker: Secondary | ICD-10-CM | POA: Diagnosis not present

## 2017-05-30 DIAGNOSIS — K219 Gastro-esophageal reflux disease without esophagitis: Secondary | ICD-10-CM | POA: Diagnosis not present

## 2017-05-30 DIAGNOSIS — F329 Major depressive disorder, single episode, unspecified: Secondary | ICD-10-CM | POA: Diagnosis not present

## 2017-05-30 DIAGNOSIS — H2512 Age-related nuclear cataract, left eye: Secondary | ICD-10-CM | POA: Diagnosis not present

## 2017-05-30 DIAGNOSIS — G4700 Insomnia, unspecified: Secondary | ICD-10-CM | POA: Diagnosis not present

## 2017-05-30 DIAGNOSIS — Z79899 Other long term (current) drug therapy: Secondary | ICD-10-CM | POA: Diagnosis not present

## 2017-05-30 DIAGNOSIS — J449 Chronic obstructive pulmonary disease, unspecified: Secondary | ICD-10-CM | POA: Diagnosis not present

## 2017-05-30 DIAGNOSIS — Z7901 Long term (current) use of anticoagulants: Secondary | ICD-10-CM | POA: Diagnosis not present

## 2017-05-30 DIAGNOSIS — Z7951 Long term (current) use of inhaled steroids: Secondary | ICD-10-CM | POA: Diagnosis not present

## 2017-05-30 DIAGNOSIS — I110 Hypertensive heart disease with heart failure: Secondary | ICD-10-CM | POA: Diagnosis not present

## 2017-05-30 DIAGNOSIS — H25012 Cortical age-related cataract, left eye: Secondary | ICD-10-CM | POA: Diagnosis not present

## 2017-05-30 DIAGNOSIS — I509 Heart failure, unspecified: Secondary | ICD-10-CM | POA: Diagnosis not present

## 2017-05-31 DIAGNOSIS — H2511 Age-related nuclear cataract, right eye: Secondary | ICD-10-CM | POA: Diagnosis not present

## 2017-06-07 ENCOUNTER — Telehealth: Payer: Self-pay | Admitting: Cardiology

## 2017-06-07 NOTE — Telephone Encounter (Signed)
I can see her Thursday at [phone]  JB

## 2017-06-07 NOTE — Telephone Encounter (Signed)
Patient c/o SOB which is new since last OV.  Stated that could happen anytime, not necessarily related to activity.  Has noticed weight gain, but does take her Lasix according to her weight.  No c/o chest pain or dizziness.  SOB just comes & goes.  Is scheduled to see Dr. Juanetta Gosling next Thursday.  Does not seem to bother her with walking (walking her dog).  Not sure that it could be anxiety due to moving to new apartment.    Couple weeks ago had cataract surgery on left eye & going back to do the right on 06/13/2017.

## 2017-06-07 NOTE — Telephone Encounter (Signed)
Patient called complaining of being extremely out of breath whenever she does something.   Walking from couch to bathroom.

## 2017-06-07 NOTE — Telephone Encounter (Signed)
Left message to return call 

## 2017-06-08 NOTE — Telephone Encounter (Signed)
Pt says she would try to make appt (living in [place], [place] right now) would talk to her son about appt and let us know by tomorrow morning. Requested we add her tomorrow at [phone]

## 2017-06-09 ENCOUNTER — Ambulatory Visit: Payer: Self-pay | Admitting: Cardiology

## 2017-06-13 DIAGNOSIS — Z950 Presence of cardiac pacemaker: Secondary | ICD-10-CM | POA: Diagnosis not present

## 2017-06-13 DIAGNOSIS — I509 Heart failure, unspecified: Secondary | ICD-10-CM | POA: Diagnosis not present

## 2017-06-13 DIAGNOSIS — J449 Chronic obstructive pulmonary disease, unspecified: Secondary | ICD-10-CM | POA: Diagnosis not present

## 2017-06-13 DIAGNOSIS — H25011 Cortical age-related cataract, right eye: Secondary | ICD-10-CM | POA: Diagnosis not present

## 2017-06-13 DIAGNOSIS — H2511 Age-related nuclear cataract, right eye: Secondary | ICD-10-CM | POA: Diagnosis not present

## 2017-06-13 DIAGNOSIS — K219 Gastro-esophageal reflux disease without esophagitis: Secondary | ICD-10-CM | POA: Diagnosis not present

## 2017-06-13 DIAGNOSIS — Z79899 Other long term (current) drug therapy: Secondary | ICD-10-CM | POA: Diagnosis not present

## 2017-06-13 DIAGNOSIS — Z7901 Long term (current) use of anticoagulants: Secondary | ICD-10-CM | POA: Diagnosis not present

## 2017-06-15 ENCOUNTER — Other Ambulatory Visit: Payer: Self-pay | Admitting: Cardiology

## 2017-06-16 ENCOUNTER — Telehealth: Payer: Self-pay | Admitting: *Deleted

## 2017-06-16 ENCOUNTER — Telehealth: Payer: Self-pay | Admitting: Cardiology

## 2017-06-16 NOTE — Telephone Encounter (Signed)
[name] [name] called for results of 04/25/17 remote transmission. Normal device function, thoracic impedance stable at that time, 62 "VT-Mon" episodes, longest lasting 16 seconds. [name] [name] verbalizes understanding.

## 2017-06-16 NOTE — Telephone Encounter (Signed)
Spoke with pt and reminded pt of remote transmission that is due today. Pt verbalized understanding.   

## 2017-06-27 NOTE — Progress Notes (Unsigned)
No ICM remote transmission received for 06/16/2017 and next ICM transmission scheduled for 07/04/2017.

## 2017-07-04 ENCOUNTER — Ambulatory Visit (INDEPENDENT_AMBULATORY_CARE_PROVIDER_SITE_OTHER): Payer: Medicare Other

## 2017-07-04 ENCOUNTER — Telehealth: Payer: Self-pay | Admitting: Cardiology

## 2017-07-04 DIAGNOSIS — Z950 Presence of cardiac pacemaker: Secondary | ICD-10-CM | POA: Diagnosis not present

## 2017-07-04 DIAGNOSIS — I5022 Chronic systolic (congestive) heart failure: Secondary | ICD-10-CM

## 2017-07-04 NOTE — Telephone Encounter (Signed)
Spoke with pt and reminded pt of remote transmission that is due today. Pt verbalized understanding.   

## 2017-07-07 NOTE — Progress Notes (Signed)
Remote transmission reviewed by device nurse Trudi Ida and results were AF/RVR per EGMs.

## 2017-07-07 NOTE — Progress Notes (Signed)
EPIC Encounter for ICM Monitoring  Patient Name: [name] [name] is a 61 y.o. female Date: 07/07/2017 Primary Care Physican: Kari Baars, MD Primary Cardiologist: Wyline Mood Electrophysiologist: Ladona Ridgel Dry Weight:142lbs  Bi-V Pacing: 93%     Clinical Status (25-Apr-2017 to 05-Jul-2017) OBSERVATIONS (1)  16 monitored VT episodes, longest was 16 sec.    Heart Failure questions reviewed, pt stated she has shortness of breath during normal activities.   Thoracic impedance abnormal suggesting fluid accumulation since approximately 05/09/2017 with 1 day at baseline.   Requested device nurse review VT episodes on this transmission.     Prescribed dosage: Furosemide 20 mg take 2 tablets (40 mg total) daily. If weight is above 146 lbs take an extra 20 mg.  Patient not taking correctly.  She has been taking 1 20 mg tablet of Furosemide daily instead of 2 tablets as prescribed.   LABS: 05/16/2017 Creatinine 0.97, BUN 19, Potassium 4.2, Sodium 141  She has high salt intake eating lot of soups and ramen noodles resulting in 1000 to 1500 mg salt for one meal.  Reviewed food labels of some of her soups and explained how to calculate sodium amount per serving and if eating > 1 serving to adjust the amount.      Recommendations:  Decrease sodium intake to <2000 mg daily and to take Furosemide 2 tablets (40 mg total) daily as prescribed.    Follow-up plan: ICM clinic phone appointment on 07/14/2017 to recheck fluid levels.    Copy of ICM check sent to Dr. Wyline Mood and Dr. Ladona Ridgel for review and recommendations if needed.   3 month ICM trend: 07/05/2017    1 Year ICM trend:       [name], RN 07/07/2017 9:16 AM

## 2017-07-14 ENCOUNTER — Ambulatory Visit (INDEPENDENT_AMBULATORY_CARE_PROVIDER_SITE_OTHER): Payer: Self-pay

## 2017-07-14 ENCOUNTER — Telehealth: Payer: Self-pay | Admitting: Cardiology

## 2017-07-14 DIAGNOSIS — Z950 Presence of cardiac pacemaker: Secondary | ICD-10-CM

## 2017-07-14 DIAGNOSIS — I5022 Chronic systolic (congestive) heart failure: Secondary | ICD-10-CM

## 2017-07-14 NOTE — Telephone Encounter (Signed)
Spoke with pt and reminded pt of remote transmission that is due today. Pt verbalized understanding.   

## 2017-07-15 DIAGNOSIS — R0989 Other specified symptoms and signs involving the circulatory and respiratory systems: Secondary | ICD-10-CM | POA: Diagnosis not present

## 2017-07-15 NOTE — Progress Notes (Signed)
EPIC Encounter for ICM Monitoring  Patient Name: [name] [name] is a 62 y.o. female Date: 07/15/2017 Primary Care Physican: Kari Baars, MD PrimaryCardiologist: Wyline Mood Electrophysiologist: Ladona Ridgel Dry Weight:140lbs  Bi-V Pacing: 91.4%     Clinical Status Since 05-Jul-2017  Longest Treated AT/AF(Monitor)    29 Monitored VT (>4 beats)                 4 sec  OBSERVATIONS (1)  1 monitored VT episodes, longest was 4 sec.      Heart Failure questions reviewed, pt reported shortness of breath has resolved since taking the correct dosage of Furosemide 2 tablets daily instead of the 1 tablet she had been taking.  She is doing better at reading labels for salt content.    Thoracic impedance abnormal suggesting fluid accumulation since 05/10/2017 but shows improvement since taking the correct dosage of Furosemide except for the last 2 days she has only been taking 1 tablet daily.   Prescribed dosage: Furosemide 20 mg take 2 tablets (40 mg total) daily. If weight is above 146 lbs take an extra 20 mg.   Patient still seems to have some confusion on the correct dosage of Furosemide.  Reminded her she should always take 2 tablets every morning.   LABS: 05/16/2017 Creatinine 0.97, BUN 19, Potassium 4.2, Sodium 141  Recommendations:  Advised to take Furosemide 2 tablets every AM and 1 tablet 20 mg in PM x 3 days only.  After 3rd day, then return to taking 2 tablets every day which is her regular dosage.  Reviewed several times with patient on instructions. Will follow up with her on Tuesday to recheck fluid levels.  She verbalized understanding on how to take the mediation and repeated instructions.  Advised to continue low salt diet  Follow-up plan: ICM clinic phone appointment on 07/27/2017 to recheck fluid levels (manual send).    Copy of ICM check sent to Dr. Ladona Ridgel and Dr. Wyline Mood for review.   3 month ICM trend: 07/14/2017    1 Year ICM trend:       [name],  RN 07/15/2017 8:13 AM

## 2017-07-19 ENCOUNTER — Telehealth: Payer: Self-pay | Admitting: Cardiology

## 2017-07-19 NOTE — Telephone Encounter (Signed)
Spoke with pt and reminded pt of remote transmission that is due today. Pt verbalized understanding.   

## 2017-07-22 ENCOUNTER — Telehealth: Payer: Self-pay

## 2017-07-22 NOTE — Telephone Encounter (Signed)
Patient said she has not been able to send ICM remote transmission from home.  She has contacted tech service number a couple of times and will need to call back again since the report is not coming through.  She said she is feeling much better after taking the Furosemide as prescribed which is 2 tablets every morning.  No longer [name] [name] of breath.  She said she will send a report as soon as she can get the machine to work.  Advised will be out of the office for holiday and to use 911 if needed or call primary cardiologist office after the holiday.

## 2017-07-25 ENCOUNTER — Other Ambulatory Visit: Payer: Self-pay | Admitting: *Deleted

## 2017-07-25 MED ORDER — LISINOPRIL 5 MG PO TABS: 5.0000 mg | ORAL_TABLET | Freq: Every day | ORAL | 1 refills | Status: DC

## 2017-07-27 ENCOUNTER — Ambulatory Visit (INDEPENDENT_AMBULATORY_CARE_PROVIDER_SITE_OTHER): Payer: Medicare Other | Admitting: *Deleted

## 2017-07-27 ENCOUNTER — Telehealth: Payer: Self-pay | Admitting: Cardiology

## 2017-07-27 DIAGNOSIS — I5022 Chronic systolic (congestive) heart failure: Secondary | ICD-10-CM

## 2017-07-27 DIAGNOSIS — I428 Other cardiomyopathies: Secondary | ICD-10-CM

## 2017-07-27 DIAGNOSIS — Z950 Presence of cardiac pacemaker: Secondary | ICD-10-CM

## 2017-07-27 NOTE — Telephone Encounter (Signed)
Spoke with pt and reminded pt of remote transmission that is due today. Pt verbalized understanding.   

## 2017-07-28 ENCOUNTER — Encounter: Payer: Self-pay | Admitting: Cardiology

## 2017-07-28 LAB — CUP PACEART REMOTE DEVICE CHECK
Brady Statistic AP VP Percent: 0 %
Brady Statistic AP VS Percent: 0 %
Brady Statistic AS VS Percent: 0 %
Brady Statistic RV Percent Paced: 88.16 %
Implantable Lead Implant Date: 20131120
Implantable Lead Location: 753858
Implantable Lead Location: 753859
Implantable Lead Location: 753860
Implantable Lead Model: 5076
Lead Channel Impedance Value: 342 Ohm
Lead Channel Impedance Value: 380 Ohm
Lead Channel Impedance Value: 4047 Ohm
Lead Channel Impedance Value: 437 Ohm
Lead Channel Impedance Value: 494 Ohm
Lead Channel Impedance Value: 551 Ohm
Lead Channel Pacing Threshold Amplitude: 1.875 V
Lead Channel Sensing Intrinsic Amplitude: 0.5 mV
Lead Channel Setting Pacing Pulse Width: 0.4 ms
Lead Channel Setting Sensing Sensitivity: 4 mV
MDC IDC LEAD IMPLANT DT: 20131120
MDC IDC LEAD IMPLANT DT: 20131120
MDC IDC MSMT BATTERY REMAINING LONGEVITY: 29 mo
MDC IDC MSMT BATTERY VOLTAGE: 2.94 V
MDC IDC MSMT LEADCHNL LV IMPEDANCE VALUE: 4047 Ohm
MDC IDC MSMT LEADCHNL LV IMPEDANCE VALUE: 4047 Ohm
MDC IDC MSMT LEADCHNL LV PACING THRESHOLD PULSEWIDTH: 1 ms
MDC IDC MSMT LEADCHNL RA IMPEDANCE VALUE: 513 Ohm
MDC IDC MSMT LEADCHNL RA SENSING INTR AMPL: 0.5 mV
MDC IDC MSMT LEADCHNL RV PACING THRESHOLD AMPLITUDE: 0.375 V
MDC IDC MSMT LEADCHNL RV PACING THRESHOLD PULSEWIDTH: 0.4 ms
MDC IDC MSMT LEADCHNL RV SENSING INTR AMPL: 7.25 mV
MDC IDC MSMT LEADCHNL RV SENSING INTR AMPL: 7.25 mV
MDC IDC PG IMPLANT DT: 20131120
MDC IDC SESS DTM: 20181227151609
MDC IDC SET LEADCHNL LV PACING AMPLITUDE: 3 V
MDC IDC SET LEADCHNL LV PACING PULSEWIDTH: 1 ms
MDC IDC SET LEADCHNL RV PACING AMPLITUDE: 2 V
MDC IDC STAT BRADY AS VP PERCENT: 0 %
MDC IDC STAT BRADY RA PERCENT PACED: 0 %

## 2017-07-28 NOTE — Progress Notes (Signed)
Remote pacemaker transmission.   

## 2017-07-29 ENCOUNTER — Telehealth: Payer: Self-pay

## 2017-07-29 NOTE — Progress Notes (Signed)
EPIC Encounter for ICM Monitoring  Patient Name: [name] [name] is a 61 y.o. female Date: 07/29/2017 Primary Care Physican: Kari Baars, MD PrimaryCardiologist: Wyline Mood Electrophysiologist: Ladona Ridgel Dry Weight: Previous weight 140lbs  Bi-V Pacing: 88.2%      Attempted call to patient and unable to reach.    Transmission reviewed.    Thoracic impedance continues to be abnormal suggesting fluid accumulation  Even after taking extra Furosemide on 07/14/2017 x 3 days.  Prescribed dosage: Furosemide20 mg take2 tablets (40 mgtotal)daily. Ifweight is above 146 lbs take an extra 20 mg.     LABS: 05/16/2017 Creatinine 0.97, BUN 19, Potassium 4.2, Sodium 141  Recommendations:  NONE - Unable to reach.  Follow-up plan: ICM clinic phone appointment on 08/16/2017.    Copy of ICM check sent to Dr. Ladona Ridgel and Dr. Wyline Mood.   3 month ICM trend: 07/28/2017    1 Year ICM trend:       [name], RN 07/29/2017 2:40 PM

## 2017-07-29 NOTE — Addendum Note (Signed)
Addended by: [name] on: 07/29/2017 03:12 PM   Modules accepted: Level of Service

## 2017-07-29 NOTE — Telephone Encounter (Signed)
Remote ICM transmission received.  Attempted call to patient and no answer   

## 2017-08-04 ENCOUNTER — Ambulatory Visit (HOSPITAL_COMMUNITY)
Admission: RE | Admit: 2017-08-04 | Discharge: 2017-08-04 | Disposition: A | Discharge status: Home / Self Care | Payer: Medicare Other | Source: Ambulatory Visit | Attending: Pulmonary Disease | Admitting: Pulmonary Disease

## 2017-08-04 ENCOUNTER — Other Ambulatory Visit (HOSPITAL_COMMUNITY): Payer: Self-pay | Admitting: Pulmonary Disease

## 2017-08-04 DIAGNOSIS — I5022 Chronic systolic (congestive) heart failure: Secondary | ICD-10-CM | POA: Diagnosis not present

## 2017-08-04 DIAGNOSIS — Z950 Presence of cardiac pacemaker: Secondary | ICD-10-CM | POA: Insufficient documentation

## 2017-08-04 DIAGNOSIS — Z23 Encounter for immunization: Secondary | ICD-10-CM | POA: Diagnosis not present

## 2017-08-04 DIAGNOSIS — J9811 Atelectasis: Secondary | ICD-10-CM | POA: Diagnosis not present

## 2017-08-04 DIAGNOSIS — J449 Chronic obstructive pulmonary disease, unspecified: Secondary | ICD-10-CM | POA: Diagnosis not present

## 2017-08-04 DIAGNOSIS — R0602 Shortness of breath: Secondary | ICD-10-CM

## 2017-08-04 DIAGNOSIS — I482 Chronic atrial fibrillation: Secondary | ICD-10-CM | POA: Diagnosis not present

## 2017-08-05 ENCOUNTER — Telehealth: Payer: Self-pay

## 2017-08-05 NOTE — Telephone Encounter (Signed)
Received call from patient.  She was returning my call.  Advised the ICM remote transmission on 07/29/2017 still showed decreased impedance suggesting she continues to retain fluid even after taking extra Furosemide.  She said she continues to get Wadie Mattie of breath when walking [place] distances.  She had an OV with Dr Juanetta Gosling this morning and he did a chest xray today, she is waiting on the results.  Requested she send remote transmission on 08/08/2017 for review.  Advised to go to ER if symptoms get worse over the weekend.

## 2017-08-08 ENCOUNTER — Telehealth: Payer: Self-pay | Admitting: Cardiology

## 2017-08-08 ENCOUNTER — Ambulatory Visit (INDEPENDENT_AMBULATORY_CARE_PROVIDER_SITE_OTHER): Payer: Medicare Other

## 2017-08-08 ENCOUNTER — Telehealth: Payer: Self-pay | Admitting: Internal Medicine

## 2017-08-08 DIAGNOSIS — Z950 Presence of cardiac pacemaker: Secondary | ICD-10-CM

## 2017-08-08 DIAGNOSIS — I5022 Chronic systolic (congestive) heart failure: Secondary | ICD-10-CM | POA: Diagnosis not present

## 2017-08-08 NOTE — Telephone Encounter (Signed)
New message ° ° °1. Has your device fired? No ° °2. Is you device beeping? No ° °3. Are you experiencing draining or swelling at device site? No ° °4. Are you calling to see if we received your device transmission? Yes ° °5. Have you passed out? No ° ° ° °Please route to Device Clinic Pool °

## 2017-08-08 NOTE — Progress Notes (Signed)
EPIC Encounter for ICM Monitoring  Patient Name: [name] [name] is a 62 y.o. female Date: 08/08/2017 Primary Care Physican: Kari Baars, MD PrimaryCardiologist: Wyline Mood Electrophysiologist: Ladona Ridgel Dry Weight: 140lbs  Bi-V Pacing: 88.2%       Heart Failure questions reviewed, pt asymptomatic and said she feels the shortness of breath when walking has resolved.  Dr Juanetta Gosling did chest x-ray on 1/3 but she does not have results.   Weight remains stable at 140 lbs.   Thoracic impedance continues to be abnormal suggesting fluid accumulation, even after taking extra Furosemide on 07/14/2017 x 3 days.  Prescribed dosage: Furosemide20 mg take2 tablets (40 mgtotal)daily. Ifweight is above 146 lbs take an extra 20 mg. Confirmed she is taking 40 mg Furosemide daily.  LABS: 05/16/2017 Creatinine 0.97, BUN 19, Potassium 4.2, Sodium 141  Recommendations:  Discussed low salt diet.  Reviewed some of foods she is eating like bacon, cheese, popcorn, eating out at restaurants a lot.  Explained how to review food labels and advised to limit salt intake to 2000 mg daily.    Follow-up plan: ICM clinic phone appointment on 08/25/2017.    Copy of ICM check sent to Dr. Wyline Mood and Dr. Ladona Ridgel for review and recommendations if needed.    3 month ICM trend: 08/08/2017    1 Year ICM trend:       [name] Soda, RN 08/08/2017 4:45 PM

## 2017-08-08 NOTE — Telephone Encounter (Signed)
Confirmed that we successfully received remote transmission.  

## 2017-08-08 NOTE — Telephone Encounter (Signed)
Spoke with pt and reminded pt of remote transmission that is due today. Pt verbalized understanding.   

## 2017-08-15 ENCOUNTER — Telehealth: Payer: Self-pay | Admitting: *Deleted

## 2017-08-15 NOTE — Telephone Encounter (Signed)
Pt notified and voiced understanding. Pt will call with update in 3 days.

## 2017-08-15 NOTE — Telephone Encounter (Signed)
-----   Message from [name], MD sent at 08/12/2017  1:53 PM EST ----- Regarding: RE: Device check fluid overload Please have this patient take lasix 60mg  x 3 days, call us Monday to update [place] on her symptoms.   Dominga Ferry MD ----- Message ----- From: Fonnie Birkenhead, CMA Sent: 08/11/2017   8:46 AM To: [name], MD Subject: Annell Greening: Device check fluid overload                  ----- Message ----- From: [name], RN Sent: 08/08/2017   5:06 PM To: Fonnie Birkenhead, CMA Subject: Device check fluid overload                    Hi Misty [name],   Not sure if you are working 1/8 but I cc'd you and Dr Wyline Mood my note on this patient.  She is asymptomatic now but did have some shortness of breath over the holiday.  She is definitely not following low salt diet.  I increased her Furosemide in December but didn't make any change.  Could you check if Dr Wyline Mood wants to make any recommendations?  I have her scheduled again for 1/24 to recheck fluid levels.  I am off 1/8 and 1/9 but someone is covering my basket.  Thanks for you help!  Jacki Cones

## 2017-08-25 ENCOUNTER — Telehealth: Payer: Self-pay | Admitting: Cardiology

## 2017-08-25 NOTE — Telephone Encounter (Signed)
Spoke with pt and reminded pt of remote transmission that is due today. Pt verbalized understanding.   

## 2017-08-26 ENCOUNTER — Ambulatory Visit: Payer: Self-pay | Admitting: Cardiology

## 2017-08-27 ENCOUNTER — Other Ambulatory Visit: Payer: Self-pay | Admitting: Cardiology

## 2017-08-29 ENCOUNTER — Ambulatory Visit: Payer: Self-pay | Admitting: Cardiology

## 2017-09-06 NOTE — Progress Notes (Signed)
No ICM remote transmission received for 08/25/2017 and next ICM transmission scheduled for 09/12/2017.

## 2017-09-12 ENCOUNTER — Telehealth: Payer: Self-pay | Admitting: Cardiology

## 2017-09-12 NOTE — Telephone Encounter (Signed)
Spoke with pt and reminded pt of remote transmission that is due today. Pt verbalized understanding.   

## 2017-09-22 ENCOUNTER — Ambulatory Visit: Payer: Self-pay | Admitting: Cardiology

## 2017-09-23 ENCOUNTER — Other Ambulatory Visit: Payer: Self-pay | Admitting: Cardiology

## 2017-09-26 ENCOUNTER — Other Ambulatory Visit: Payer: Self-pay | Admitting: Cardiology

## 2017-09-27 NOTE — Progress Notes (Signed)
No ICM remote transmission received for 09/12/2017 and next ICM transmission scheduled for 10/11/2017.

## 2017-10-11 ENCOUNTER — Telehealth: Payer: Self-pay | Admitting: Cardiology

## 2017-10-11 NOTE — Telephone Encounter (Signed)
Spoke with pt and reminded pt of remote transmission that is due today. Pt verbalized understanding.   

## 2017-10-13 ENCOUNTER — Ambulatory Visit (INDEPENDENT_AMBULATORY_CARE_PROVIDER_SITE_OTHER): Payer: Medicare Other | Admitting: Cardiology

## 2017-10-13 ENCOUNTER — Encounter: Payer: Self-pay | Admitting: Cardiology

## 2017-10-13 VITALS — BP 122/70 | HR 77 | Ht 64.0 in | Wt 140.0 lb

## 2017-10-13 DIAGNOSIS — I5022 Chronic systolic (congestive) heart failure: Secondary | ICD-10-CM | POA: Diagnosis not present

## 2017-10-13 DIAGNOSIS — I4891 Unspecified atrial fibrillation: Secondary | ICD-10-CM | POA: Diagnosis not present

## 2017-10-13 DIAGNOSIS — I340 Nonrheumatic mitral (valve) insufficiency: Secondary | ICD-10-CM

## 2017-10-13 MED ORDER — FUROSEMIDE 40 MG PO TABS: ORAL_TABLET | ORAL | 3 refills | Status: DC

## 2017-10-13 NOTE — Progress Notes (Addendum)
Clinical Summary [name] [name] is a 62 y.o.female seen today for follow up of the following medical problems.   1. NICM  - echo 10/2013 LVEF 30-35%, restrictive diastolic dysfunction - cath Jan 2011 showed no obstructive CAD  - repeat echo 10/2015 LVEF 45-50%.    - some recent SOB, worst around Christmas time. She reports some poor compliance with her diuretic at the time - taking lasix 40mg  daily. Symptoms have improved   2. Chronic afib  - prior AV nodal ablation with permanent pacemaker  - no recent palpitations. Isolated mild blood in stool   3. Moderate MR - recent symptoms as reported above of increased SOB, improving with increased compliance with lasix.   4. Pacemaker - she has a [employer]onic BiV pacemaker followed by Dr Ladona Ridgel, LV lead replaced epicardial by Dr Laneta Simmers 01/2015  - sent transmission yesterday   5. COPD - management per Dr Juanetta Gosling.  - appt coming up soon.   6. HTN - remains compliant with meds  Labs pending with pcp   Past Medical History:  Diagnosis Date  . Anemia    2011: Hemoglobin of 11.4; hematocrit of 33.5; normal MCV  . Anxiety   . Anxiety and depression   . Atrial fibrillation (HCC)    Echocardiogram in 2004-borderline LV function; no valvular abnormalities; 06/2012: AV nodal ablation + biventricular pacemaker  . Cardiac arrest (HCC) 06/2012   16 day hospitalization; caused by hyperkalemia; multiple complications including CHF; permanent atrial fibrillation requiring AV nodal ablation + pacing  . CHF (congestive heart failure) (HCC)   . COPD (chronic obstructive pulmonary disease) (HCC)    heterozygous alpha-1 anti-trypsin deficiency  . Depression   . Dysrhythmia   . GERD (gastroesophageal reflux disease)    protonix  . Nonischemic cardiomyopathy (HCC)    Echo 06/2012: EF 30-35%, moderate to severe MR; cardiac cath 06/2012:30-40% D1 and D2, estimated EF-35%, MR-not graded; right bundle Lamorris Knoblock block  . Presence of  permanent cardiac pacemaker   . Shortness of breath dyspnea   . Thyroid disease   . Urinary frequency      No Known Allergies   Current Outpatient Medications  Medication Sig Dispense Refill  . albuterol (PROVENTIL HFA;VENTOLIN HFA) 108 (90 Base) MCG/ACT inhaler Inhale 2 puffs into the lungs every 6 (six) hours as needed for wheezing or shortness of breath. 1 Inhaler 2  . ALPRAZolam (XANAX) 0.25 MG tablet Take 0.25 mg by mouth 2 (two) times daily as needed for anxiety.    . calcium carbonate (TUMS - DOSED IN MG ELEMENTAL CALCIUM) 500 MG chewable tablet Chew 2 tablets by mouth 3 (three) times daily.    . carvedilol (COREG) 12.5 MG tablet Take 1 tablet (12.5 mg total) by mouth 2 (two) times daily with a meal. 180 tablet 0  . carvedilol (COREG) 12.5 MG tablet TAKE ONE TABLET BY MOUTH TWICE DAILY WITH  MEALS 60 tablet 6  . furosemide (LASIX) 20 MG tablet Take 40 mg daily. I weight is above 146 lbs take an extra 20 mg. 90 tablet 6  . lisinopril (PRINIVIL,ZESTRIL) 5 MG tablet Take 1 tablet (5 mg total) by mouth daily. 90 tablet 1  . magnesium oxide (MAG-OX) 400 (241.3 Mg) MG tablet Take 1 tablet (400 mg total) by mouth daily. 90 tablet 0  . magnesium oxide (MAG-OX) 400 (241.3 Mg) MG tablet TAKE ONE TABLET BY MOUTH ONCE DAILY 90 tablet 3  . pantoprazole (PROTONIX) 40 MG tablet Take 40 mg by mouth  daily.    . sertraline (ZOLOFT) 50 MG tablet Take 50 mg by mouth daily.    Marland Kitchen SPIRIVA HANDIHALER 18 MCG inhalation capsule INHALE 1 PUFF BY MOUTH ONCE DAILY 30 capsule 3  . tiotropium (SPIRIVA HANDIHALER) 18 MCG inhalation capsule INHALE ONE DOSE IN INHALER BY MOUTH ONCE DAILY 90 capsule 0  . XARELTO 20 MG TABS tablet TAKE 1 TABLET BY MOUTH ONCE DAILY 90 tablet 1  . zolpidem (AMBIEN) 5 MG tablet Take 5 mg by mouth at bedtime. For sleep     No current facility-administered medications for this visit.      Past Surgical History:  Procedure Laterality Date  . AV NODE ABLATION N/A 06/21/2012    Procedure: AV NODE ABLATION;  Surgeon: Marinus Maw, MD;  Location: [facility] CATH LAB;  Service: Cardiovascular;  Laterality: N/A;  . AV NODE ABLATION N/A 06/22/2012   Procedure: AV NODE ABLATION;  Surgeon: Duke Salvia, MD;  Location: [facility] CATH LAB;  Service: Cardiovascular;  Laterality: N/A;  . BREAST BIOPSY     left, [facility]  . BREAST EXCISIONAL BIOPSY     Left x2  . CARDIOVERSION  06/19/2012   Procedure: CARDIOVERSION;  Surgeon: Vesta Mixer, MD;  Location: [facility] ENDOSCOPY;  Service: Cardiovascular;;  . CARDIOVERSION  06/21/2012   Procedure: CARDIOVERSION;  Surgeon: Cassell Clement, MD;  Location: [facility] OR;  Service: Cardiovascular;  Laterality: N/A;  . CERVICAL CONIZATION W/BX  08/11/2011   Procedure: CONIZATION CERVIX WITH BIOPSY;  Surgeon: Lazaro Arms, MD;  Location: [facility] ORS;  Service: Gynecology;  Laterality: N/A;  Laser Conization of Cervix  . EPICARDIAL PACING LEAD PLACEMENT N/A 02/28/2015   Procedure: LV EPICARDIAL PACING LEAD PLACEMENT;  Surgeon: Alleen Borne, MD;  Location: [facility] OR;  Service: Thoracic;  Laterality: N/A;  . INSERT / REPLACE / REMOVE PACEMAKER    . LEFT HEART CATHETERIZATION WITH CORONARY ANGIOGRAM N/A 06/15/2012   Procedure: LEFT HEART CATHETERIZATION WITH CORONARY ANGIOGRAM;  Surgeon: Herby Abraham, MD;  Location: [facility] CATH LAB;  Service: Cardiovascular;  Laterality: N/A;  . PERMANENT PACEMAKER INSERTION N/A 06/21/2012   Procedure: PERMANENT PACEMAKER INSERTION;  Surgeon: Marinus Maw, MD;  Location: [facility] CATH LAB;  Service: Cardiovascular;  Laterality: N/A;  . TEE WITHOUT CARDIOVERSION  06/19/2012   Procedure: TRANSESOPHAGEAL ECHOCARDIOGRAM (TEE);  Surgeon: Vesta Mixer, MD;  Location: [facility] ENDOSCOPY;  Service: Cardiovascular;  Laterality: N/A;  . THORACOTOMY Left 02/28/2015   Procedure: THORACOTOMY MAJOR;  Surgeon: Alleen Borne, MD;  Location: [facility] OR;  Service: Thoracic;  Laterality: Left;     No Known Allergies    Family History  Problem Relation Age of Onset    . Cancer Father        carcinoma of the lung  . Cervical cancer Mother   . Cancer Sister        carcinoma of the lung  . Irritable bowel syndrome Unknown        mother, sister  . Anesthesia problems Neg Hx   . Hypotension Neg Hx   . Malignant hyperthermia Neg Hx   . Pseudochol deficiency Neg Hx   . Colon cancer Neg Hx   . Celiac disease Neg Hx   . Inflammatory bowel disease Neg Hx      Social History [name] [name] reports that  has never smoked. she has never used smokeless tobacco. [name] [name] reports that she does not drink alcohol.   Review of Systems CONSTITUTIONAL: No weight loss, fever, chills, weakness  or fatigue.  HEENT: Eyes: No visual loss, blurred vision, double vision or yellow sclerae.No hearing loss, sneezing, congestion, runny nose or sore throat.  SKIN: No rash or itching.  CARDIOVASCULAR: per hpi RESPIRATORY: per hpi GASTROINTESTINAL: No anorexia, nausea, vomiting or diarrhea. No abdominal pain or blood.  GENITOURINARY: No burning on urination, no polyuria NEUROLOGICAL: No headache, dizziness, syncope, paralysis, ataxia, numbness or tingling in the extremities. No change in bowel or bladder control.  MUSCULOSKELETAL: No muscle, back pain, joint pain or stiffness.  LYMPHATICS: No enlarged nodes. No history of splenectomy.  PSYCHIATRIC: No history of depression or anxiety.  ENDOCRINOLOGIC: No reports of sweating, cold or heat intolerance. No polyuria or polydipsia.  Marland Kitchen   Physical Examination Vitals:   10/13/17 1427  BP: 122/70  Pulse: 77  SpO2: 97%   Vitals:   10/13/17 1427  Weight: 140 lb (63.5 kg)  Height: 5\' 4"  (1.626 m)    Gen: resting comfortably, no acute distress HEENT: no scleral icterus, pupils equal round and reactive, no palptable cervical adenopathy,  CV: RRR, 2/6 systolic murmur apex, no jvd Resp: Clear to auscultation bilaterally GI: abdomen is soft, non-tender, non-distended, normal bowel sounds, no hepatosplenomegaly MSK: extremities  are warm, no edema.  Skin: warm, no rash Neuro:  no focal deficits Psych: appropriate affect   Diagnostic Studies 06/2012 TEE: mod to severe MR, milld to mod TR   06/12/12 Cath Hemodynamics:  AO 118/68 (83)  LV 119/8  Cannot calculate gradient due to rapid atrial fib  Coronary angiography:  Coronary dominance: right  Left mainstem: Short without significant disease  Left anterior descending (LAD): There is calcification proximally. There are two major diagonal branches. Between the first and second diagonal branches are approximately 30-40% plaquing. The distal LAD has mild irregularity. Both diagonals have minimal irregularity.  Left circumflex (LCx): Provides one large bifurcation marginal Kaedin Hicklin and a small AV circumflex. There is minor distal irregularity. The AV circ is small  Right coronary artery (RCA): Provides a large PDA and a large and smaller PLA branches. No significant focal obstruction.  Left ventriculography: Due to ectopy, EF cannot be calculated. With AF, difficult to be sure of function but appears 35%. There does appear to be some MR.  Final Conclusions:  1. Atrial fib with rapid ventricular response.  2. Mild calcification of proximal LAD without critical obstruction. No high grade coronary artery disease.  09/2012 Echo: LVEF 25%, severely dilated LV, mild LVH, mild AI, mild MR   09/26/13 Clinic EKG Afib, V-paced  11/08/13 Echo Study Conclusions  - Left ventricle: The cavity size was moderately dilated. Wall thickness was increased in a pattern of mild LVH. Systolic function was moderately to severely reduced. The estimated ejection fraction was in the range of 30% to 35%. Diffuse hypokinesis. There is severe hypokinesis of the anteroseptal myocardium. Doppler parameters are consistent with restrictive physiology, indicative of decreased left ventricular diastolic compliance and/or increased left atrial pressure. - Ventricular septum:  Septal motion showed abnormal function and dyssynergy. - Aortic valve: Trileaflet; mildly thickened leaflets. Mild to moderate regurgitation. - Mitral valve: Mildly thickened leaflets . Moderate regurgitation, appears to be made up of two jets. Suboptimal PISA. - Left atrium: The atrium was severely dilated. - Right ventricle: The cavity size was mildly dilated. Pacer wire or catheter noted in right ventricle. - Right atrium: The atrium was moderately to severely dilated. - Tricuspid valve: Mild-moderate regurgitation. - Pulmonic valve: Mild regurgitation. - Pulmonary arteries: Systolic pressure was moderately increased. - Pericardium,  extracardiac: There was no pericardial effusion. Impressions:  - Mild LVH with moderate chamber dilatation and LVEF 30-35% with diffuse hypokinesis, most prominent anteroseptal wall in setting of septal dyssynergy and pacing. Restrictive diastolic filling pattern with increased filling pressures. Severe left atrial and moderate to severe right atrial enlargement. MIldly thickened mitral valve with overall moderate mitral regurgitation made up of two jets (PISA suboptimal). Mild to moderate aortic regurgitation. Device wire in right heart. MIld to moderate tricuspid regurgitation with PASP 51 mmHg.   10/2015 echo Study Conclusions  - Left ventricle: The cavity size was normal. Systolic function was mildly reduced. The estimated ejection fraction was approximately 45%. Diffuse hypokinesis. Diastolic dysfunction, indeterminate grade and filling pressures. Mild septal thickening. - Aortic valve: Mildly to moderately calcified annulus. Trileaflet. There was mild to moderate regurgitation. - Mitral valve: There was moderate regurgitation. - Left atrium: The atrium was severely dilated. - Right ventricle: Pacer wire or catheter noted in right ventricle. - Right atrium: The atrium was mildly to moderately dilated. - Tricuspid valve: There  was mild regurgitation. - Pulmonic valve: There was mild regurgitation.    Assessment and Plan  1. NICM/Chronic systolic HF - getting back to her baseline from symptoms standpoint since she has increased her compliance with lasix and also taking higher doses at times. . We will change her dosing to 40mg  daily.  - continue current meds  2. Afib  - hx of av nodal ablation with pacemaker  - CHADS2Vasc score is 4, she will continue anticoag - isolated bleeding episode in commode, continue to monitor at this time.   3. Mitral regurgitation  - moderate to severe by TEE 06/2012, follow up TTE's have been stable mild to moderate. No current symptoms, likely functional MR related to her LV systolic dysfunction.  - repeat echo  4. Pacemaker - she will continue to follow with device clinic - EKG today shows V-paced rhythm   5. COPD - followed by Dr Juanetta Gosling.   6. HTN - bp at goal, continue current meds   F/u 33months.      [name], M.D.

## 2017-10-13 NOTE — Patient Instructions (Signed)
Medication Instructions:  TAKE LASIX 40 MG DAILY  Labwork: I WILL REQUEST LABS FROM PCP  Testing/Procedures: Your physician has requested that you have an echocardiogram. Echocardiography is a painless test that uses sound waves to create images of your heart. It provides your doctor with information about the size and shape of your heart and how well your heart's chambers and valves are working. This procedure takes approximately one hour. There are no restrictions for this procedure.    Follow-Up: Your physician wants you to follow-up in: 6 MONTHS. You will receive a reminder letter in the mail two months in advance. If you don't receive a letter, please call our office to schedule the follow-up appointment.   Any Other Special Instructions Will Be Listed Below (If Applicable).     If you need a refill on your cardiac medications before your next appointment, please call your pharmacy.

## 2017-10-17 ENCOUNTER — Encounter: Payer: Self-pay | Admitting: Cardiology

## 2017-10-17 NOTE — Progress Notes (Signed)
No ICM remote transmission received for 10/11/2017 and next ICM transmission scheduled for 10/26/2017.

## 2017-10-18 DIAGNOSIS — M10072 Idiopathic gout, left ankle and foot: Secondary | ICD-10-CM | POA: Diagnosis not present

## 2017-10-18 DIAGNOSIS — M21612 Bunion of left foot: Secondary | ICD-10-CM | POA: Diagnosis not present

## 2017-10-26 ENCOUNTER — Encounter: Payer: Medicare Other | Admitting: *Deleted

## 2017-10-26 ENCOUNTER — Telehealth: Payer: Self-pay | Admitting: Cardiology

## 2017-10-26 NOTE — Telephone Encounter (Signed)
Spoke with pt and reminded pt of remote transmission that is due today. Pt verbalized understanding.   

## 2017-10-28 ENCOUNTER — Encounter: Payer: Self-pay | Admitting: Cardiology

## 2017-11-01 ENCOUNTER — Other Ambulatory Visit (HOSPITAL_COMMUNITY): Payer: Self-pay

## 2017-11-07 ENCOUNTER — Other Ambulatory Visit: Payer: Self-pay | Admitting: Cardiology

## 2017-11-17 ENCOUNTER — Telehealth: Payer: Self-pay | Admitting: Cardiology

## 2017-11-17 ENCOUNTER — Ambulatory Visit (HOSPITAL_COMMUNITY): Admission: RE | Admit: 2017-11-17 | Payer: Medicare Other | Source: Ambulatory Visit

## 2017-11-17 NOTE — Telephone Encounter (Signed)
Spoke with pt and reminded pt of remote transmission that is due today. Pt verbalized understanding.   

## 2017-11-18 DIAGNOSIS — M109 Gout, unspecified: Secondary | ICD-10-CM | POA: Diagnosis not present

## 2017-11-18 NOTE — Progress Notes (Signed)
No ICM remote transmission received for 11/17/2017 and next ICM transmission scheduled for 12/05/2017.    

## 2017-11-29 DIAGNOSIS — M109 Gout, unspecified: Secondary | ICD-10-CM | POA: Diagnosis not present

## 2017-11-29 DIAGNOSIS — I509 Heart failure, unspecified: Secondary | ICD-10-CM | POA: Diagnosis not present

## 2017-11-29 DIAGNOSIS — I110 Hypertensive heart disease with heart failure: Secondary | ICD-10-CM | POA: Diagnosis not present

## 2017-11-29 DIAGNOSIS — G4700 Insomnia, unspecified: Secondary | ICD-10-CM | POA: Diagnosis not present

## 2017-11-29 DIAGNOSIS — J449 Chronic obstructive pulmonary disease, unspecified: Secondary | ICD-10-CM | POA: Diagnosis not present

## 2017-11-29 DIAGNOSIS — Z79899 Other long term (current) drug therapy: Secondary | ICD-10-CM | POA: Diagnosis not present

## 2017-11-29 DIAGNOSIS — M79644 Pain in right finger(s): Secondary | ICD-10-CM | POA: Diagnosis not present

## 2017-11-29 DIAGNOSIS — F329 Major depressive disorder, single episode, unspecified: Secondary | ICD-10-CM | POA: Diagnosis not present

## 2017-11-29 DIAGNOSIS — K219 Gastro-esophageal reflux disease without esophagitis: Secondary | ICD-10-CM | POA: Diagnosis not present

## 2017-11-29 DIAGNOSIS — Z950 Presence of cardiac pacemaker: Secondary | ICD-10-CM | POA: Diagnosis not present

## 2017-12-05 ENCOUNTER — Telehealth: Payer: Self-pay

## 2017-12-05 ENCOUNTER — Ambulatory Visit (INDEPENDENT_AMBULATORY_CARE_PROVIDER_SITE_OTHER): Payer: Medicare Other

## 2017-12-05 DIAGNOSIS — Z950 Presence of cardiac pacemaker: Secondary | ICD-10-CM

## 2017-12-05 DIAGNOSIS — I5022 Chronic systolic (congestive) heart failure: Secondary | ICD-10-CM

## 2017-12-05 NOTE — Telephone Encounter (Signed)
LMOVM reminding pt to send remote transmission.   

## 2017-12-08 ENCOUNTER — Ambulatory Visit (INDEPENDENT_AMBULATORY_CARE_PROVIDER_SITE_OTHER): Payer: Medicare Other | Admitting: *Deleted

## 2017-12-08 ENCOUNTER — Telehealth: Payer: Self-pay

## 2017-12-08 DIAGNOSIS — I428 Other cardiomyopathies: Secondary | ICD-10-CM | POA: Diagnosis not present

## 2017-12-08 NOTE — Progress Notes (Signed)
EPIC Encounter for ICM Monitoring  Patient Name: MARDA MIR is a 62 y.o. female Date: 12/08/2017 Primary Care Physican: Kari Baars, MD PrimaryCardiologist: Wyline Mood Electrophysiologist: Ladona Ridgel Dry Weight: 144lbs  Bi-V Pacing: 94.4%  Clinical Status Since 08-Aug-2017  Monitored VT (>4 beats) 171   Longest 21 sec        Heart Failure questions reviewed, pt asymptomatic today.  She took extra Furosemide tablet for past 2 days due to weight gain.    Requested device clinic triage review report for monitored VT and instructed to send report to Dr Ladona Ridgel for review.    Thoracic impedance normal today but was abnormal suggesting fluid accumulation from 11/19/2017 - 12/08/2017.  Prescribed dosage: Furosemide20 mg take2 tablets (40 mgtotal)daily. Ifweight is above 146 lbs take an extra 20 mg.  LABS: 05/16/2017 Creatinine 0.97, BUN 19, Potassium 4.2, Sodium 141  Recommendations: No changes.  Encouraged to call for fluid symptoms.  Follow-up plan: ICM clinic phone appointment on 01/09/2018.    Copy of ICM check sent to Dr. Ladona Ridgel.   VT episode dates are 08/10/17, 10/08/17, 11/09/17, 11/21/17, 11/24/17 and 12/02/17.  Longest episode 21 seconds occurred 11/24/2017.  3 month ICM trend: 12/08/2017    1 Year ICM trend:       [name], RN 12/08/2017 4:42 PM

## 2017-12-08 NOTE — Telephone Encounter (Signed)
Returned patient call as requested by voice mail message.  She stated she sent her remote transmission report today but advised it was not received. She said she would try and send again.

## 2017-12-08 NOTE — Progress Notes (Signed)
Remote pacemaker transmission.   

## 2017-12-09 NOTE — Telephone Encounter (Signed)
See 12/05/2017 ICM note for completion of call.

## 2017-12-12 ENCOUNTER — Ambulatory Visit (HOSPITAL_COMMUNITY): Admission: RE | Admit: 2017-12-12 | Payer: Medicare Other | Source: Ambulatory Visit

## 2017-12-13 ENCOUNTER — Encounter: Payer: Self-pay | Admitting: Cardiology

## 2017-12-20 ENCOUNTER — Other Ambulatory Visit: Payer: Self-pay | Admitting: Cardiology

## 2018-01-02 ENCOUNTER — Ambulatory Visit (HOSPITAL_COMMUNITY)
Admission: RE | Admit: 2018-01-02 | Discharge: 2018-01-02 | Disposition: A | Discharge status: Home / Self Care | Payer: Medicare Other | Source: Ambulatory Visit | Attending: Cardiology | Admitting: Cardiology

## 2018-01-02 DIAGNOSIS — I420 Dilated cardiomyopathy: Secondary | ICD-10-CM | POA: Diagnosis not present

## 2018-01-02 DIAGNOSIS — I5030 Unspecified diastolic (congestive) heart failure: Secondary | ICD-10-CM | POA: Insufficient documentation

## 2018-01-02 DIAGNOSIS — I340 Nonrheumatic mitral (valve) insufficiency: Secondary | ICD-10-CM | POA: Diagnosis not present

## 2018-01-02 DIAGNOSIS — J441 Chronic obstructive pulmonary disease with (acute) exacerbation: Secondary | ICD-10-CM | POA: Diagnosis not present

## 2018-01-02 DIAGNOSIS — I482 Chronic atrial fibrillation: Secondary | ICD-10-CM | POA: Diagnosis not present

## 2018-01-02 DIAGNOSIS — E1169 Type 2 diabetes mellitus with other specified complication: Secondary | ICD-10-CM | POA: Diagnosis not present

## 2018-01-02 DIAGNOSIS — I5022 Chronic systolic (congestive) heart failure: Secondary | ICD-10-CM | POA: Diagnosis not present

## 2018-01-02 DIAGNOSIS — I083 Combined rheumatic disorders of mitral, aortic and tricuspid valves: Secondary | ICD-10-CM | POA: Diagnosis not present

## 2018-01-02 NOTE — Progress Notes (Signed)
*  PRELIMINARY RESULTS* Echocardiogram 2D Echocardiogram has been performed.  Stacey Drain 01/02/2018, 1:57 PM

## 2018-01-03 DIAGNOSIS — I5022 Chronic systolic (congestive) heart failure: Secondary | ICD-10-CM | POA: Diagnosis not present

## 2018-01-03 DIAGNOSIS — J449 Chronic obstructive pulmonary disease, unspecified: Secondary | ICD-10-CM | POA: Diagnosis not present

## 2018-01-03 DIAGNOSIS — I482 Chronic atrial fibrillation: Secondary | ICD-10-CM | POA: Diagnosis not present

## 2018-01-03 DIAGNOSIS — E1169 Type 2 diabetes mellitus with other specified complication: Secondary | ICD-10-CM | POA: Diagnosis not present

## 2018-01-03 NOTE — Telephone Encounter (Signed)
Called pt. No answer. Left message for pt to return call.  

## 2018-01-03 NOTE — Telephone Encounter (Signed)
-----   Message from [name], MD sent at 01/03/2018  3:54 PM EDT ----- Echo shows normal heart pumping function,. She has 3 heart valves that are moderately leaky (mitral, tricuspid, aortic). The heart can tolerate this fine, just something for [place] to continue to monitor  J BrancH MD

## 2018-01-04 ENCOUNTER — Telehealth: Payer: Self-pay | Admitting: Internal Medicine

## 2018-01-04 NOTE — Telephone Encounter (Signed)
Returned pt call. Informed her of results.

## 2018-01-04 NOTE — Telephone Encounter (Signed)
Returning call from yesterday. °

## 2018-01-05 LAB — CUP PACEART REMOTE DEVICE CHECK
Battery Remaining Longevity: 24 mo
Battery Voltage: 2.92 V
Brady Statistic AS VP Percent: 0 %
Brady Statistic RA Percent Paced: 0 %
Brady Statistic RV Percent Paced: 94.38 %
Implantable Lead Implant Date: 20131120
Implantable Lead Implant Date: 20131120
Implantable Lead Location: 753858
Implantable Lead Location: 753859
Implantable Lead Location: 753860
Implantable Lead Model: 4196
Implantable Lead Model: 5076
Implantable Pulse Generator Implant Date: 20131120
Lead Channel Impedance Value: 4047 Ohm
Lead Channel Impedance Value: 4047 Ohm
Lead Channel Impedance Value: 4047 Ohm
Lead Channel Impedance Value: 513 Ohm
Lead Channel Pacing Threshold Pulse Width: 0.4 ms
Lead Channel Pacing Threshold Pulse Width: 1 ms
Lead Channel Sensing Intrinsic Amplitude: 0.5 mV
Lead Channel Sensing Intrinsic Amplitude: 10.375 mV
Lead Channel Setting Pacing Pulse Width: 1 ms
MDC IDC LEAD IMPLANT DT: 20131120
MDC IDC MSMT LEADCHNL LV IMPEDANCE VALUE: 323 Ohm
MDC IDC MSMT LEADCHNL LV IMPEDANCE VALUE: 494 Ohm
MDC IDC MSMT LEADCHNL LV PACING THRESHOLD AMPLITUDE: 1.875 V
MDC IDC MSMT LEADCHNL RA IMPEDANCE VALUE: 380 Ohm
MDC IDC MSMT LEADCHNL RA SENSING INTR AMPL: 0.5 mV
MDC IDC MSMT LEADCHNL RV IMPEDANCE VALUE: 456 Ohm
MDC IDC MSMT LEADCHNL RV IMPEDANCE VALUE: 589 Ohm
MDC IDC MSMT LEADCHNL RV PACING THRESHOLD AMPLITUDE: 0.375 V
MDC IDC MSMT LEADCHNL RV SENSING INTR AMPL: 10.375 mV
MDC IDC SESS DTM: 20190510021505
MDC IDC SET LEADCHNL LV PACING AMPLITUDE: 3 V
MDC IDC SET LEADCHNL RV PACING AMPLITUDE: 2 V
MDC IDC SET LEADCHNL RV PACING PULSEWIDTH: 0.4 ms
MDC IDC SET LEADCHNL RV SENSING SENSITIVITY: 4 mV
MDC IDC STAT BRADY AP VP PERCENT: 0 %
MDC IDC STAT BRADY AP VS PERCENT: 0 %
MDC IDC STAT BRADY AS VS PERCENT: 0 %

## 2018-01-10 ENCOUNTER — Telehealth: Payer: Self-pay

## 2018-01-10 NOTE — Telephone Encounter (Signed)
LMOVM reminding pt to send remote transmission.   

## 2018-01-13 NOTE — Progress Notes (Signed)
No ICM remote transmission received for 01/09/2018 and next ICM transmission scheduled for 01/26/2018.    

## 2018-01-17 ENCOUNTER — Ambulatory Visit (INDEPENDENT_AMBULATORY_CARE_PROVIDER_SITE_OTHER): Payer: Medicare Other

## 2018-01-17 ENCOUNTER — Telehealth: Payer: Self-pay

## 2018-01-17 DIAGNOSIS — I5022 Chronic systolic (congestive) heart failure: Secondary | ICD-10-CM

## 2018-01-17 DIAGNOSIS — Z950 Presence of cardiac pacemaker: Secondary | ICD-10-CM

## 2018-01-17 NOTE — Progress Notes (Signed)
EPIC Encounter for ICM Monitoring  Patient Name: [name] [name] is a 62 y.o. female Date: 01/17/2018 Primary Care Physican: Kari Baars, MD PrimaryCardiologist: Wyline Mood Electrophysiologist: Ladona Ridgel Dry Weight: 146lbs  Bi-V Pacing: 93.5%  OBSERVATIONS (1)  4 monitored VT episodes, longest was 5 sec.       Heart Failure questions reviewed, pt asymptomatic but has had gout in foot over the last week    Thoracic impedance normal.  Prescribed dosage: Furosemide20 mg take2 tablets (40 mgtotal)daily. Ifweight is above 146 lbs take an extra 20 mg.  LABS: 05/16/2017 Creatinine 0.97, BUN 19, Potassium 4.2, Sodium 141  Recommendations: No changes.    Encouraged to call for fluid symptoms.  Follow-up plan: ICM clinic phone appointment on 02/17/2018.  Office appointment scheduled 03/24/2018 with Dr. Ladona Ridgel.  Copy of ICM check sent to Dr. Ladona Ridgel.   3 month ICM trend: 01/17/2018    1 Year ICM trend:       [name], RN 01/17/2018 5:20 PM

## 2018-01-17 NOTE — Telephone Encounter (Signed)
Return call to patient as requested by voice mail message asking if remote transmission was received.  Advised no transmission was received. She said she replaced monitor batteries and attempted to send one 01/17/2018.  Provided tech service number for assistance if the monitor is not working.  She will try it again.

## 2018-01-26 DIAGNOSIS — M79642 Pain in left hand: Secondary | ICD-10-CM | POA: Diagnosis not present

## 2018-01-26 DIAGNOSIS — Z950 Presence of cardiac pacemaker: Secondary | ICD-10-CM | POA: Diagnosis not present

## 2018-01-26 DIAGNOSIS — I509 Heart failure, unspecified: Secondary | ICD-10-CM | POA: Diagnosis not present

## 2018-01-26 DIAGNOSIS — J449 Chronic obstructive pulmonary disease, unspecified: Secondary | ICD-10-CM | POA: Diagnosis not present

## 2018-01-26 DIAGNOSIS — I110 Hypertensive heart disease with heart failure: Secondary | ICD-10-CM | POA: Diagnosis not present

## 2018-01-26 DIAGNOSIS — K219 Gastro-esophageal reflux disease without esophagitis: Secondary | ICD-10-CM | POA: Diagnosis not present

## 2018-01-26 DIAGNOSIS — M10042 Idiopathic gout, left hand: Secondary | ICD-10-CM | POA: Diagnosis not present

## 2018-02-17 ENCOUNTER — Telehealth: Payer: Self-pay

## 2018-02-17 ENCOUNTER — Ambulatory Visit: Payer: Medicare Other

## 2018-02-17 NOTE — Telephone Encounter (Signed)
Spoke with pt and reminded pt of remote transmission that is due today. Pt verbalized understanding.   

## 2018-02-21 ENCOUNTER — Telehealth: Payer: Self-pay

## 2018-02-21 NOTE — Progress Notes (Signed)
Patient unable to transmit transmission.

## 2018-02-21 NOTE — Telephone Encounter (Signed)
Returned call as requested by voice mail message regarding if transmission was received. Advised did not receive transmission.  She tried sending it yesterday but will try again today.

## 2018-02-21 NOTE — Telephone Encounter (Signed)
Returned patient call and she is still unable to send remote transmission.  Provided carelink number and advised her to call tomorrow.

## 2018-02-23 ENCOUNTER — Ambulatory Visit (INDEPENDENT_AMBULATORY_CARE_PROVIDER_SITE_OTHER): Payer: Medicare Other

## 2018-02-23 DIAGNOSIS — Z950 Presence of cardiac pacemaker: Secondary | ICD-10-CM

## 2018-02-23 DIAGNOSIS — I5022 Chronic systolic (congestive) heart failure: Secondary | ICD-10-CM | POA: Diagnosis not present

## 2018-02-23 NOTE — Progress Notes (Signed)
EPIC Encounter for ICM Monitoring  Patient Name: [name] [name] is a 62 y.o. female Date: 02/23/2018 Primary Care Physican: Kari Baars, MD PrimaryCardiologist: Wyline Mood Electrophysiologist: Ladona Ridgel Dry Weight:142lbs  Bi-V Pacing: 93.7%  OBSERVATIONS (1)   9 monitored VT episodes, longest was 10 sec.      Heart Failure questions reviewed, pt asymptomatic.   Thoracic impedance normal.  Prescribed dosage: Furosemide20 mg take2 tablets (40 mgtotal)daily. Ifweight is above 146 lbs take an extra 20 mg.  LABS: 05/16/2017 Creatinine 0.97, BUN 19, Potassium 4.2, Sodium 141  Recommendations: No changes.  Encouraged to call for fluid symptoms.  Follow-up plan: ICM clinic phone appointment on 04/24/2018 since there is an defib check in the office scheduled 03/24/2018 with Dr. Ladona Ridgel.    Copy of ICM check sent to Dr. Ladona Ridgel.   3 month ICM trend: 02/23/2018    1 Year ICM trend:       [name], RN 02/23/2018 10:37 AM

## 2018-02-27 ENCOUNTER — Other Ambulatory Visit: Payer: Self-pay | Admitting: Cardiology

## 2018-03-08 ENCOUNTER — Encounter: Payer: Self-pay | Admitting: Internal Medicine

## 2018-03-08 NOTE — Telephone Encounter (Signed)
This encounter was created in error - please disregard.

## 2018-03-09 ENCOUNTER — Encounter: Payer: Medicare Other | Admitting: *Deleted

## 2018-03-10 ENCOUNTER — Telehealth: Payer: Self-pay

## 2018-03-10 NOTE — Telephone Encounter (Signed)
Spoke with pt and reminded pt of remote transmission that is due today. Pt verbalized understanding.  Out of town for mother funeral.

## 2018-03-13 ENCOUNTER — Encounter: Payer: Medicare Other | Admitting: *Deleted

## 2018-03-13 ENCOUNTER — Telehealth: Payer: Self-pay | Admitting: Cardiology

## 2018-03-13 NOTE — Telephone Encounter (Signed)
Spoke with pt and reminded pt of remote transmission that is due today. Pt verbalized understanding.   

## 2018-03-15 ENCOUNTER — Encounter: Payer: Self-pay | Admitting: Cardiology

## 2018-03-16 DIAGNOSIS — Z124 Encounter for screening for malignant neoplasm of cervix: Secondary | ICD-10-CM | POA: Diagnosis not present

## 2018-03-16 DIAGNOSIS — N950 Postmenopausal bleeding: Secondary | ICD-10-CM | POA: Diagnosis not present

## 2018-03-20 DIAGNOSIS — N950 Postmenopausal bleeding: Secondary | ICD-10-CM | POA: Diagnosis not present

## 2018-03-24 ENCOUNTER — Ambulatory Visit (INDEPENDENT_AMBULATORY_CARE_PROVIDER_SITE_OTHER): Payer: Medicare Other | Admitting: Internal Medicine

## 2018-03-24 ENCOUNTER — Encounter: Payer: Self-pay | Admitting: Internal Medicine

## 2018-03-24 VITALS — BP 136/72 | HR 71 | Ht 64.0 in | Wt 145.6 lb

## 2018-03-24 DIAGNOSIS — I4891 Unspecified atrial fibrillation: Secondary | ICD-10-CM

## 2018-03-24 DIAGNOSIS — Z950 Presence of cardiac pacemaker: Secondary | ICD-10-CM

## 2018-03-24 DIAGNOSIS — I340 Nonrheumatic mitral (valve) insufficiency: Secondary | ICD-10-CM

## 2018-03-24 DIAGNOSIS — I5022 Chronic systolic (congestive) heart failure: Secondary | ICD-10-CM

## 2018-03-24 NOTE — Patient Instructions (Addendum)
Medication Instructions:  Your physician recommends that you continue on your current medications as directed. Please refer to the Current Medication list given to you today.  Labwork: None ordered.  Testing/Procedures: None ordered.  Follow-Up: Your physician wants you to follow-up in: one year with Dr. Ladona Ridgel in [place].   You will receive a reminder letter in the mail two months in advance. If you don't receive a letter, please call our office to schedule the follow-up appointment.  Remote monitoring is used to monitor your Pacemaker from home. This monitoring reduces the number of office visits required to check your device to one time per year. It allows [place] to keep an eye on the functioning of your device to ensure it is working properly. You are scheduled for a device check from home on 04/24/2018. You may send your transmission at any time that day. If you have a wireless device, the transmission will be sent automatically. After your physician reviews your transmission, you will receive a postcard with your next transmission date.  Any Other Special Instructions Will Be Listed Below (If Applicable).  If you need a refill on your cardiac medications before your next appointment, please call your pharmacy.

## 2018-03-24 NOTE — Progress Notes (Signed)
HPI [name] [name] returns today for followup. She is a pleasant 62 yo woman with a non-ischemic CM, EF 30-35%, chronic atrial fib with an RVR, s/p AV node ablation and PPM insertion. She has an epicardial LV lead after she developed diaphragmatic stimulation and her CHF symptoms are well compensated. She denies chest pain or sob. No syncope. In the interim she has done well although she admits to dietary indiscretion with sodium.  No Known Allergies   Current Outpatient Medications  Medication Sig Dispense Refill  . albuterol (PROVENTIL HFA;VENTOLIN HFA) 108 (90 Base) MCG/ACT inhaler Inhale 2 puffs into the lungs every 6 (six) hours as needed for wheezing or shortness of breath. 1 Inhaler 2  . ALPRAZolam (XANAX) 0.25 MG tablet Take 0.25 mg by mouth 2 (two) times daily as needed for anxiety.    . calcium carbonate (TUMS - DOSED IN MG ELEMENTAL CALCIUM) 500 MG chewable tablet Chew 2 tablets by mouth 3 (three) times daily.    . carvedilol (COREG) 12.5 MG tablet Take 1 tablet (12.5 mg total) by mouth 2 (two) times daily with a meal. 180 tablet 0  . carvedilol (COREG) 12.5 MG tablet TAKE 1 TABLET BY MOUTH TWICE DAILY WITH MEALS 180 tablet 3  . furosemide (LASIX) 40 MG tablet Take 40 mg daily. I weight is above 146 lbs take an extra 20 mg. 120 tablet 3  . lisinopril (PRINIVIL,ZESTRIL) 5 MG tablet TAKE 1 TABLET BY MOUTH ONCE DAILY 90 tablet 0  . magnesium oxide (MAG-OX) 400 (241.3 Mg) MG tablet Take 1 tablet (400 mg total) by mouth daily. 90 tablet 0  . magnesium oxide (MAG-OX) 400 (241.3 Mg) MG tablet TAKE ONE TABLET BY MOUTH ONCE DAILY 90 tablet 3  . pantoprazole (PROTONIX) 40 MG tablet Take 40 mg by mouth daily.    . sertraline (ZOLOFT) 50 MG tablet Take 50 mg by mouth daily.    Marland Kitchen SPIRIVA HANDIHALER 18 MCG inhalation capsule INHALE 1 PUFF BY MOUTH ONCE DAILY 30 capsule 3  . tiotropium (SPIRIVA HANDIHALER) 18 MCG inhalation capsule INHALE ONE DOSE IN INHALER BY MOUTH ONCE DAILY 90 capsule 0  .  XARELTO 20 MG TABS tablet TAKE 1 TABLET BY MOUTH ONCE DAILY 90 tablet 1  . zolpidem (AMBIEN) 5 MG tablet Take 5 mg by mouth at bedtime. For sleep     No current facility-administered medications for this visit.      Past Medical History:  Diagnosis Date  . Anemia    2011: Hemoglobin of 11.4; hematocrit of 33.5; normal MCV  . Anxiety   . Anxiety and depression   . Atrial fibrillation (HCC)    Echocardiogram in 2004-borderline LV function; no valvular abnormalities; 06/2012: AV nodal ablation + biventricular pacemaker  . Cardiac arrest (HCC) 06/2012   16 day hospitalization; caused by hyperkalemia; multiple complications including CHF; permanent atrial fibrillation requiring AV nodal ablation + pacing  . CHF (congestive heart failure) (HCC)   . COPD (chronic obstructive pulmonary disease) (HCC)    heterozygous alpha-1 anti-trypsin deficiency  . Depression   . Dysrhythmia   . GERD (gastroesophageal reflux disease)    protonix  . Nonischemic cardiomyopathy (HCC)    Echo 06/2012: EF 30-35%, moderate to severe MR; cardiac cath 06/2012:30-40% D1 and D2, estimated EF-35%, MR-not graded; right bundle branch block  . Presence of permanent cardiac pacemaker   . Shortness of breath dyspnea   . Thyroid disease   . Urinary frequency     ROS:  All systems reviewed and negative except as noted in the HPI.   Past Surgical History:  Procedure Laterality Date  . AV NODE ABLATION N/A 06/21/2012   Procedure: AV NODE ABLATION;  Surgeon: Marinus Maw, MD;  Location: [facility] CATH LAB;  Service: Cardiovascular;  Laterality: N/A;  . AV NODE ABLATION N/A 06/22/2012   Procedure: AV NODE ABLATION;  Surgeon: Duke Salvia, MD;  Location: [facility] CATH LAB;  Service: Cardiovascular;  Laterality: N/A;  . BREAST BIOPSY     left, [facility]  . BREAST EXCISIONAL BIOPSY     Left x2  . CARDIOVERSION  06/19/2012   Procedure: CARDIOVERSION;  Surgeon: Vesta Mixer, MD;  Location: [facility] ENDOSCOPY;  Service:  Cardiovascular;;  . CARDIOVERSION  06/21/2012   Procedure: CARDIOVERSION;  Surgeon: Cassell Clement, MD;  Location: [facility] OR;  Service: Cardiovascular;  Laterality: N/A;  . CERVICAL CONIZATION W/BX  08/11/2011   Procedure: CONIZATION CERVIX WITH BIOPSY;  Surgeon: Lazaro Arms, MD;  Location: [facility] ORS;  Service: Gynecology;  Laterality: N/A;  Laser Conization of Cervix  . EPICARDIAL PACING LEAD PLACEMENT N/A 02/28/2015   Procedure: LV EPICARDIAL PACING LEAD PLACEMENT;  Surgeon: Alleen Borne, MD;  Location: [facility] OR;  Service: Thoracic;  Laterality: N/A;  . INSERT / REPLACE / REMOVE PACEMAKER    . LEFT HEART CATHETERIZATION WITH CORONARY ANGIOGRAM N/A 06/15/2012   Procedure: LEFT HEART CATHETERIZATION WITH CORONARY ANGIOGRAM;  Surgeon: Herby Abraham, MD;  Location: [facility] CATH LAB;  Service: Cardiovascular;  Laterality: N/A;  . PERMANENT PACEMAKER INSERTION N/A 06/21/2012   Procedure: PERMANENT PACEMAKER INSERTION;  Surgeon: Marinus Maw, MD;  Location: [facility] CATH LAB;  Service: Cardiovascular;  Laterality: N/A;  . TEE WITHOUT CARDIOVERSION  06/19/2012   Procedure: TRANSESOPHAGEAL ECHOCARDIOGRAM (TEE);  Surgeon: Vesta Mixer, MD;  Location: [facility] ENDOSCOPY;  Service: Cardiovascular;  Laterality: N/A;  . THORACOTOMY Left 02/28/2015   Procedure: THORACOTOMY MAJOR;  Surgeon: Alleen Borne, MD;  Location: [facility] OR;  Service: Thoracic;  Laterality: Left;     Family History  Problem Relation Age of Onset  . Cancer Father        carcinoma of the lung  . Cervical cancer Mother   . Cancer Sister        carcinoma of the lung  . Irritable bowel syndrome Unknown        mother, sister  . Anesthesia problems Neg Hx   . Hypotension Neg Hx   . Malignant hyperthermia Neg Hx   . Pseudochol deficiency Neg Hx   . Colon cancer Neg Hx   . Celiac disease Neg Hx   . Inflammatory bowel disease Neg Hx      Social History   Socioeconomic History  . Marital status: Divorced    Spouse name: Not on file  . Number of  children: 2  . Years of education: Not on file  . Highest education level: Not on file  Occupational History  . 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 Financial resource strain: Not on file  . Food insecurity:    Worry: Not on file    Inability: Not on file  . Transportation needs:    Medical: Not on file    Non-medical: Not on file  Tobacco Use  . Smoking status: Never Smoker  . Smokeless tobacco: Never Used  Substance and Sexual Activity  . Alcohol use: No    Alcohol/week: 0.0 standard drinks  . Drug use: No  . Sexual activity: Not on file  Lifestyle  . Physical activity:    Days per week: Not on file    Minutes per session: Not on file  . Stress: Not on file  Relationships  . Social connections:    Talks on phone: Not on file    Gets together: Not on file    Attends religious service: Not on file    Active member of club or organization: Not on file    Attends meetings of clubs or organizations: Not on file    Relationship status: Not on file  . Intimate partner violence:    Fear of current or ex partner: Not on file    Emotionally abused: Not on file    Physically abused: Not on file    Forced sexual activity: Not on file  Other Topics Concern  . Not on file  Social History Narrative   ** Merged History Encounter **       ** Data from: 06/22/12 Enc Dept: [facility]       ** Data from: 06/05/12 Enc Dept: [facility]   Lives alone with good family support from sisters.            BP - 136/72, P - 72, Wt. - 145 Physical Exam:  Well appearing NAD HEENT: Unremarkable Neck:  6 cm JVD, no thyromegally Lymphatics:  No adenopathy Back:  No CVA tenderness Lungs:  Clear with no wheezes HEART:  Regular rate rhythm, no murmurs, no rubs, no clicks Abd:  soft, positive bowel sounds, no organomegally, no rebound, no guarding Ext:  2  plus pulses, no edema, no cyanosis, no clubbing Skin:  No rashes no nodules Neuro:  CN II through XII intact, motor grossly intact  EKG - atrial fib with ventricular pacing  DEVICE  Normal device function.  See PaceArt for details.   Assess/Plan: 1. CHB - she is s/p AV node ablation and is doing well. 2. PPM - her medtronic biv PPM is working normally. Her LV lead is up a bit.  3. Chronic systolic heart failure - her symptoms are class 2. I have strongly encouraged her to reduce her salt intake. 4. HTN - her blood pressure is reasonably well controlled. She does admit to sodium excess and I have asked her to curtain her salt intake.  Leonia Reeves.D.

## 2018-03-29 LAB — CUP PACEART INCLINIC DEVICE CHECK
Battery Remaining Longevity: 22 mo
Battery Voltage: 2.91 V
Brady Statistic AP VP Percent: 0 %
Brady Statistic AP VS Percent: 0 %
Brady Statistic AS VS Percent: 5.27 %
Brady Statistic RV Percent Paced: 93.57 %
Implantable Lead Implant Date: 20131120
Implantable Lead Implant Date: 20131120
Implantable Lead Implant Date: 20131120
Implantable Lead Location: 753859
Implantable Lead Model: 4196
Implantable Lead Model: 5076
Implantable Pulse Generator Implant Date: 20131120
Lead Channel Impedance Value: 475 Ohm
Lead Channel Impedance Value: 494 Ohm
Lead Channel Impedance Value: 589 Ohm
Lead Channel Pacing Threshold Amplitude: 0.5 V
Lead Channel Pacing Threshold Amplitude: 2 V
Lead Channel Pacing Threshold Pulse Width: 0.4 ms
Lead Channel Pacing Threshold Pulse Width: 1 ms
Lead Channel Sensing Intrinsic Amplitude: 0.5 mV
Lead Channel Sensing Intrinsic Amplitude: 8.875 mV
Lead Channel Setting Pacing Amplitude: 2 V
Lead Channel Setting Pacing Pulse Width: 0.4 ms
Lead Channel Setting Pacing Pulse Width: 1 ms
Lead Channel Setting Sensing Sensitivity: 4 mV
MDC IDC LEAD LOCATION: 753858
MDC IDC LEAD LOCATION: 753860
MDC IDC MSMT LEADCHNL LV IMPEDANCE VALUE: 342 Ohm
MDC IDC MSMT LEADCHNL LV IMPEDANCE VALUE: 4047 Ohm
MDC IDC MSMT LEADCHNL LV IMPEDANCE VALUE: 4047 Ohm
MDC IDC MSMT LEADCHNL LV IMPEDANCE VALUE: 4047 Ohm
MDC IDC MSMT LEADCHNL RA IMPEDANCE VALUE: 380 Ohm
MDC IDC MSMT LEADCHNL RA IMPEDANCE VALUE: 513 Ohm
MDC IDC SESS DTM: 20190823215734
MDC IDC SET LEADCHNL LV PACING AMPLITUDE: 3 V
MDC IDC STAT BRADY AS VP PERCENT: 94.73 %
MDC IDC STAT BRADY RA PERCENT PACED: 0 %

## 2018-04-12 ENCOUNTER — Ambulatory Visit (INDEPENDENT_AMBULATORY_CARE_PROVIDER_SITE_OTHER): Payer: Medicare Other | Admitting: *Deleted

## 2018-04-12 DIAGNOSIS — I428 Other cardiomyopathies: Secondary | ICD-10-CM

## 2018-04-13 ENCOUNTER — Telehealth: Payer: Self-pay

## 2018-04-13 ENCOUNTER — Ambulatory Visit (INDEPENDENT_AMBULATORY_CARE_PROVIDER_SITE_OTHER): Payer: Medicare Other

## 2018-04-13 DIAGNOSIS — Z950 Presence of cardiac pacemaker: Secondary | ICD-10-CM

## 2018-04-13 DIAGNOSIS — I5022 Chronic systolic (congestive) heart failure: Secondary | ICD-10-CM

## 2018-04-13 NOTE — Progress Notes (Signed)
Remote pacemaker transmission.   

## 2018-04-13 NOTE — Progress Notes (Signed)
EPIC Encounter for ICM Monitoring  Patient Name: [name] [name] is a 62 y.o. female Date: 04/13/2018 Primary Care Physican: Kari Baars, MD PrimaryCardiologist: Wyline Mood Electrophysiologist: Ladona Ridgel Dry Weight:148lbs  Bi-V Pacing: 95.6%  Clinical Status (24-Mar-2018 to 12-Apr-2018)  4 monitored VT episodes, longest was 10 sec.  22 V. Sensing Episodes   26 VT-NS        Patient called stating she has been feeling really tired and weight gain of ~6lbs in the last couple of weeks.    Thoracic impedance abnormal suggesting fluid accumulation starting 03/02/2018.  Prescribed dosage: Furosemide20 mg take2 tablets (40 mgtotal)daily. Ifweight is above 146 lbs take an extra 20 mg.Patient takes Furosemide differently: She takes 1 tablet daily and occasionally takes the 2nd tablet.    LABS: 05/16/2017 Creatinine 0.97, BUN 19, Potassium 4.2, Sodium 141  Recommendations:  Advised to take Furosemide 40 mg bid x 3 days and then return to prescribed dosage 40 mg daily  Follow-up plan: ICM clinic phone appointment on 04/24/2018 to recheck fluid levels.   Office appointment scheduled 04/21/2018 with Dr. Wyline Mood.    Copy of ICM check sent to Dr. Johney Frame and Dr Wyline Mood.   3 month ICM trend: 04/13/2018    1 Year ICM trend:       [name], RN 04/13/2018 5:10 PM

## 2018-04-13 NOTE — Telephone Encounter (Signed)
Attempted returned patient call as requested by voice mail message asking if remote transmission was received.   Left message confirming report was received.

## 2018-04-15 ENCOUNTER — Other Ambulatory Visit: Payer: Self-pay | Admitting: Cardiology

## 2018-04-18 ENCOUNTER — Telehealth: Payer: Self-pay | Admitting: Cardiology

## 2018-04-18 ENCOUNTER — Ambulatory Visit (INDEPENDENT_AMBULATORY_CARE_PROVIDER_SITE_OTHER): Payer: Self-pay

## 2018-04-18 DIAGNOSIS — I5022 Chronic systolic (congestive) heart failure: Secondary | ICD-10-CM

## 2018-04-18 DIAGNOSIS — Z950 Presence of cardiac pacemaker: Secondary | ICD-10-CM

## 2018-04-18 NOTE — Telephone Encounter (Signed)
Returned patient call.  See ICM note for today.

## 2018-04-18 NOTE — Telephone Encounter (Signed)
New Patient     Patient is requesting a call back regarding her fluid levels

## 2018-04-18 NOTE — Progress Notes (Signed)
EPIC Encounter for ICM Monitoring  Patient Name: [name] [name] is a 62 y.o. female Date: 04/18/2018 Primary Care Physican: Kari Baars, MD PrimaryCardiologist: Wyline Mood Electrophysiologist: Ladona Ridgel Dry Weight:141lbs  Bi-V Pacing: 95.6%   OBSERVATIONS (1)  1 monitored VT episodes, longest was 5 sec.      Heart Failure questions reviewed, pt lost 7 lbs since taking extra Furosemide x 3 days.  She is no longer short of breath and feels much better.  Device clinic RN Trudi Ida reviewed VT episode.     Thoracic impedance returned to normal after taking extra Furosemide  Prescribed: Furosemide20 mg take2 tablets (40 mgtotal)daily. Ifweight is above 146 lbs take an extra 20 mg.Patient takes Furosemide differently: She takes 1 tablet daily and occasionally takes the 2nd tablet.    LABS: 05/16/2017 Creatinine 0.97, BUN 19, Potassium 4.2, Sodium 141  Recommendations: No changes.    Follow-up plan: ICM clinic phone appointment on 05/01/2018.    Copy of ICM check sent to Dr. Ladona Ridgel.   3 month ICM trend: 04/18/2018    1 Year ICM trend:      [name], RN 04/18/2018 2:53 PM

## 2018-04-20 ENCOUNTER — Encounter: Payer: Self-pay | Admitting: *Deleted

## 2018-04-21 ENCOUNTER — Ambulatory Visit (INDEPENDENT_AMBULATORY_CARE_PROVIDER_SITE_OTHER): Payer: Medicare Other | Admitting: Cardiology

## 2018-04-21 ENCOUNTER — Encounter: Payer: Self-pay | Admitting: Cardiology

## 2018-04-21 VITALS — BP 110/64 | HR 80 | Ht 64.0 in | Wt 147.0 lb

## 2018-04-21 DIAGNOSIS — I5022 Chronic systolic (congestive) heart failure: Secondary | ICD-10-CM

## 2018-04-21 DIAGNOSIS — E118 Type 2 diabetes mellitus with unspecified complications: Secondary | ICD-10-CM

## 2018-04-21 DIAGNOSIS — I4891 Unspecified atrial fibrillation: Secondary | ICD-10-CM | POA: Diagnosis not present

## 2018-04-21 DIAGNOSIS — I10 Essential (primary) hypertension: Secondary | ICD-10-CM

## 2018-04-21 NOTE — Progress Notes (Signed)
Clinical Summary [name] [name] is a 62 y.o.female seen today for follow up of the following medical problems.   1. NICM  - echo 10/2013 LVEF 30-35%, restrictive diastolic dysfunction - cath Jan 2011 showed no obstructive CAD  - repeat echo 10/2015 LVEF 45-50%.  - 12/2017 LVEF 50-55%,    - notes 04/13/18 reporte 6 lbs weight gain, some fatigue. Asked to take lasix 40mg  bid x 3 days, then back to 40mg  daily.  - weight down 7 lbs with extra lasix, thoracic impedance when checked 9/17 was normal.  - home weights down to 142 lbs. Had been up to 148 lbs.  - compliant with meds  2. Chronic afib  - prior AV nodal ablation with permanent pacemaker  - no recent palpitations. Isolated mild blood in stool  - no recent symptoms.   3. Pacemaker - she has a Medtronic BiV pacemaker followed by Dr Ladona Ridgel, LV lead replaced epicardial by Dr Laneta Simmers 01/2015  - no recent symptoms   4. COPD - management per Dr Juanetta Gosling.  - appt coming up soon.   5. HTN - compliant with meds     SH: Moved to [place], [place], continues to come to [place] for cardiology and to visit her family   Past Medical History:  Diagnosis Date  . Anemia    2011: Hemoglobin of 11.4; hematocrit of 33.5; normal MCV  . Anxiety   . Anxiety and depression   . Atrial fibrillation (HCC)    Echocardiogram in 2004-borderline LV function; no valvular abnormalities; 06/2012: AV nodal ablation + biventricular pacemaker  . Cardiac arrest (HCC) 06/2012   16 day hospitalization; caused by hyperkalemia; multiple complications including CHF; permanent atrial fibrillation requiring AV nodal ablation + pacing  . CHF (congestive heart failure) (HCC)   . COPD (chronic obstructive pulmonary disease) (HCC)    heterozygous alpha-1 anti-trypsin deficiency  . Depression   . Dysrhythmia   . GERD (gastroesophageal reflux disease)    protonix  . Nonischemic cardiomyopathy (HCC)    Echo 06/2012: EF 30-35%, moderate to severe  MR; cardiac cath 06/2012:30-40% D1 and D2, estimated EF-35%, MR-not graded; right bundle [name] [name] block  . Presence of permanent cardiac pacemaker   . Shortness of breath dyspnea   . Thyroid disease   . Urinary frequency      No Known Allergies   Current Outpatient Medications  Medication Sig Dispense Refill  . albuterol (PROVENTIL HFA;VENTOLIN HFA) 108 (90 Base) MCG/ACT inhaler Inhale 2 puffs into the lungs every 6 (six) hours as needed for wheezing or shortness of breath. 1 Inhaler 2  . ALPRAZolam (XANAX) 0.25 MG tablet Take 0.25 mg by mouth 2 (two) times daily as needed for anxiety.    . calcium carbonate (TUMS - DOSED IN MG ELEMENTAL CALCIUM) 500 MG chewable tablet Chew 2 tablets by mouth 3 (three) times daily.    . carvedilol (COREG) 12.5 MG tablet Take 1 tablet (12.5 mg total) by mouth 2 (two) times daily with a meal. 180 tablet 0  . carvedilol (COREG) 12.5 MG tablet TAKE 1 TABLET BY MOUTH TWICE DAILY WITH MEALS 180 tablet 3  . furosemide (LASIX) 40 MG tablet Take 40 mg daily. I weight is above 146 lbs take an extra 20 mg. 120 tablet 3  . lisinopril (PRINIVIL,ZESTRIL) 5 MG tablet TAKE 1 TABLET BY MOUTH ONCE DAILY 90 tablet 0  . magnesium oxide (MAG-OX) 400 (241.3 Mg) MG tablet Take 1 tablet (400 mg total) by mouth daily. 90 tablet  0  . magnesium oxide (MAG-OX) 400 (241.3 Mg) MG tablet TAKE ONE TABLET BY MOUTH ONCE DAILY 90 tablet 3  . pantoprazole (PROTONIX) 40 MG tablet Take 40 mg by mouth daily.    . sertraline (ZOLOFT) 50 MG tablet Take 50 mg by mouth daily.    Marland Kitchen SPIRIVA HANDIHALER 18 MCG inhalation capsule INHALE 1 PUFF BY MOUTH ONCE DAILY 30 capsule 3  . tiotropium (SPIRIVA HANDIHALER) 18 MCG inhalation capsule INHALE ONE DOSE IN INHALER BY MOUTH ONCE DAILY 90 capsule 0  . XARELTO 20 MG TABS tablet TAKE 1 TABLET BY MOUTH ONCE DAILY 90 tablet 1  . zolpidem (AMBIEN) 5 MG tablet Take 5 mg by mouth at bedtime. For sleep     No current facility-administered medications for this  visit.      Past Surgical History:  Procedure Laterality Date  . AV NODE ABLATION N/A 06/21/2012   Procedure: AV NODE ABLATION;  Surgeon: Marinus Maw, MD;  Location: [facility] CATH LAB;  Service: Cardiovascular;  Laterality: N/A;  . AV NODE ABLATION N/A 06/22/2012   Procedure: AV NODE ABLATION;  Surgeon: Duke Salvia, MD;  Location: [facility] CATH LAB;  Service: Cardiovascular;  Laterality: N/A;  . BREAST BIOPSY     left, [facility]  . BREAST EXCISIONAL BIOPSY     Left x2  . CARDIOVERSION  06/19/2012   Procedure: CARDIOVERSION;  Surgeon: Vesta Mixer, MD;  Location: [facility] ENDOSCOPY;  Service: Cardiovascular;;  . CARDIOVERSION  06/21/2012   Procedure: CARDIOVERSION;  Surgeon: Cassell Clement, MD;  Location: [facility] OR;  Service: Cardiovascular;  Laterality: N/A;  . CERVICAL CONIZATION W/BX  08/11/2011   Procedure: CONIZATION CERVIX WITH BIOPSY;  Surgeon: Lazaro Arms, MD;  Location: [facility] ORS;  Service: Gynecology;  Laterality: N/A;  Laser Conization of Cervix  . EPICARDIAL PACING LEAD PLACEMENT N/A 02/28/2015   Procedure: LV EPICARDIAL PACING LEAD PLACEMENT;  Surgeon: Alleen Borne, MD;  Location: [facility] OR;  Service: Thoracic;  Laterality: N/A;  . INSERT / REPLACE / REMOVE PACEMAKER    . LEFT HEART CATHETERIZATION WITH CORONARY ANGIOGRAM N/A 06/15/2012   Procedure: LEFT HEART CATHETERIZATION WITH CORONARY ANGIOGRAM;  Surgeon: Herby Abraham, MD;  Location: [facility] CATH LAB;  Service: Cardiovascular;  Laterality: N/A;  . PERMANENT PACEMAKER INSERTION N/A 06/21/2012   Procedure: PERMANENT PACEMAKER INSERTION;  Surgeon: Marinus Maw, MD;  Location: [facility] CATH LAB;  Service: Cardiovascular;  Laterality: N/A;  . TEE WITHOUT CARDIOVERSION  06/19/2012   Procedure: TRANSESOPHAGEAL ECHOCARDIOGRAM (TEE);  Surgeon: Vesta Mixer, MD;  Location: [facility] ENDOSCOPY;  Service: Cardiovascular;  Laterality: N/A;  . THORACOTOMY Left 02/28/2015   Procedure: THORACOTOMY MAJOR;  Surgeon: Alleen Borne, MD;  Location: [facility] OR;  Service:  Thoracic;  Laterality: Left;     No Known Allergies    Family History  Problem Relation Age of Onset  . Cancer Father        carcinoma of the lung  . Cervical cancer Mother   . Cancer Sister        carcinoma of the lung  . Irritable bowel syndrome Unknown        mother, sister  . Anesthesia problems Neg Hx   . Hypotension Neg Hx   . Malignant hyperthermia Neg Hx   . Pseudochol deficiency Neg Hx   . Colon cancer Neg Hx   . Celiac disease Neg Hx   . Inflammatory bowel disease Neg Hx      Social History [name] [name] reports  that she has never smoked. She has never used smokeless tobacco. [name] [name] reports that she does not drink alcohol.   Review of Systems CONSTITUTIONAL: No weight loss, fever, chills, weakness or fatigue.  HEENT: Eyes: No visual loss, blurred vision, double vision or yellow sclerae.No hearing loss, sneezing, congestion, runny nose or sore throat.  SKIN: No rash or itching.  CARDIOVASCULAR: per hpi RESPIRATORY: No shortness of breath, cough or sputum.  GASTROINTESTINAL: No anorexia, nausea, vomiting or diarrhea. No abdominal pain or blood.  GENITOURINARY: No burning on urination, no polyuria NEUROLOGICAL: No headache, dizziness, syncope, paralysis, ataxia, numbness or tingling in the extremities. No change in bowel or bladder control.  MUSCULOSKELETAL: No muscle, back pain, joint pain or stiffness.  LYMPHATICS: No enlarged nodes. No history of splenectomy.  PSYCHIATRIC: No history of depression or anxiety.  ENDOCRINOLOGIC: No reports of sweating, cold or heat intolerance. No polyuria or polydipsia.  Marland Kitchen   Physical Examination Vitals:   04/21/18 1343  BP: 110/64  Pulse: 80  SpO2: 96%   Vitals:   04/21/18 1343  Weight: 147 lb (66.7 kg)  Height: 5\' 4"  (1.626 m)    Gen: resting comfortably, no acute distress HEENT: no scleral icterus, pupils equal round and reactive, no palptable cervical adenopathy,  CV: RRR, 2/6 systolic murmur at apex, no  jvd Resp: Clear to auscultation bilaterally GI: abdomen is soft, non-tender, non-distended, normal bowel sounds, no hepatosplenomegaly MSK: extremities are warm, no edema.  Skin: warm, no rash Neuro:  no focal deficits Psych: appropriate affect   Diagnostic Studies 06/2012 TEE: mod to severe MR, milld to mod TR   06/12/12 Cath Hemodynamics:  AO 118/68 (83)  LV 119/8  Cannot calculate gradient due to rapid atrial fib  Coronary angiography:  Coronary dominance: right  Left mainstem: Short without significant disease  Left anterior descending (LAD): There is calcification proximally. There are two major diagonal branches. Between the first and second diagonal branches are approximately 30-40% plaquing. The distal LAD has mild irregularity. Both diagonals have minimal irregularity.  Left circumflex (LCx): Provides one large bifurcation marginal [name] [name] and a small AV circumflex. There is minor distal irregularity. The AV circ is small  Right coronary artery (RCA): Provides a large PDA and a large and smaller PLA branches. No significant focal obstruction.  Left ventriculography: Due to ectopy, EF cannot be calculated. With AF, difficult to be sure of function but appears 35%. There does appear to be some MR.  Final Conclusions:  1. Atrial fib with rapid ventricular response.  2. Mild calcification of proximal LAD without critical obstruction. No high grade coronary artery disease.  09/2012 Echo: LVEF 25%, severely dilated LV, mild LVH, mild AI, mild MR   09/26/13 Clinic EKG Afib, V-paced  11/08/13 Echo Study Conclusions  - Left ventricle: The cavity size was moderately dilated. Wall thickness was increased in a pattern of mild LVH. Systolic function was moderately to severely reduced. The estimated ejection fraction was in the range of 30% to 35%. Diffuse hypokinesis. There is severe hypokinesis of the anteroseptal myocardium. Doppler parameters are consistent  with restrictive physiology, indicative of decreased left ventricular diastolic compliance and/or increased left atrial pressure. - Ventricular septum: Septal motion showed abnormal function and dyssynergy. - Aortic valve: Trileaflet; mildly thickened leaflets. Mild to moderate regurgitation. - Mitral valve: Mildly thickened leaflets . Moderate regurgitation, appears to be made up of two jets. Suboptimal PISA. - Left atrium: The atrium was severely dilated. - Right ventricle: The cavity size was mildly  dilated. Pacer wire or catheter noted in right ventricle. - Right atrium: The atrium was moderately to severely dilated. - Tricuspid valve: Mild-moderate regurgitation. - Pulmonic valve: Mild regurgitation. - Pulmonary arteries: Systolic pressure was moderately increased. - Pericardium, extracardiac: There was no pericardial effusion. Impressions:  - Mild LVH with moderate chamber dilatation and LVEF 30-35% with diffuse hypokinesis, most prominent anteroseptal wall in setting of septal dyssynergy and pacing. Restrictive diastolic filling pattern with increased filling pressures. Severe left atrial and moderate to severe right atrial enlargement. MIldly thickened mitral valve with overall moderate mitral regurgitation made up of two jets (PISA suboptimal). Mild to moderate aortic regurgitation. Device wire in right heart. MIld to moderate tricuspid regurgitation with PASP 51 mmHg.   10/2015 echo Study Conclusions  - Left ventricle: The cavity size was normal. Systolic function was mildly reduced. The estimated ejection fraction was approximately 45%. Diffuse hypokinesis. Diastolic dysfunction, indeterminate grade and filling pressures. Mild septal thickening. - Aortic valve: Mildly to moderately calcified annulus. Trileaflet. There was mild to moderate regurgitation. - Mitral valve: There was moderate regurgitation. - Left atrium: The atrium was severely  dilated. - Right ventricle: Pacer wire or catheter noted in right ventricle. - Right atrium: The atrium was mildly to moderately dilated. - Tricuspid valve: There was mild regurgitation. - Pulmonic valve: There was mild regurgitation.     Assessment and Plan   1. NICM/Chronic systolic HF -recent weight/fluid gain now resolved with a few days of extra lasix, back to her regular dose of 40mg  daily - continue current meds  2. Afib  - hx of av nodal ablation with pacemaker  - CHADS2Vasc score is 4, she will continue anticoag - no recent symptoms, continue current meds   3. Pacemaker - she will continue to follow with device clinic   4. HTN - at goal, continue current meds    Obtain annual labs. Order nonfasting lipid panel as she lives out of town and easier to have drawn after clinic today.   F/u 6 months   [name], M.D..

## 2018-04-21 NOTE — Patient Instructions (Signed)
Your physician wants you to follow-up in: 6 MONTHS WITH DR [facility]  You will receive a reminder letter in the mail two months in advance. If you don't receive a letter, please call our office to schedule the follow-up appointment.  Your physician recommends that you continue on your current medications as directed. Please refer to the Current Medication list given to you today.  Your physician recommends that you return for lab work - CBC/TSH/MG/BMP/LIPIDS/HGBA1C   Thank you for choosing [facility]!!

## 2018-04-25 DIAGNOSIS — I4891 Unspecified atrial fibrillation: Secondary | ICD-10-CM | POA: Diagnosis not present

## 2018-04-25 DIAGNOSIS — E118 Type 2 diabetes mellitus with unspecified complications: Secondary | ICD-10-CM | POA: Diagnosis not present

## 2018-04-26 LAB — BASIC METABOLIC PANEL
BUN/Creatinine Ratio: 21 (calc) (ref 6–22)
BUN: 22 mg/dL (ref 7–25)
CALCIUM: 9.4 mg/dL (ref 8.6–10.4)
CO2: 28 mmol/L (ref 20–32)
Chloride: 103 mmol/L (ref 98–110)
Creat: 1.07 mg/dL — ABNORMAL HIGH (ref 0.50–0.99)
GLUCOSE: 93 mg/dL (ref 65–99)
Potassium: 4.6 mmol/L (ref 3.5–5.3)
SODIUM: 140 mmol/L (ref 135–146)

## 2018-04-26 LAB — HEMOGLOBIN A1C
Hgb A1c MFr Bld: 5.8 % of total Hgb — ABNORMAL HIGH (ref <5.7)
Mean Plasma Glucose: 120 (calc)
eAG (mmol/L): 6.6 (calc)

## 2018-04-26 LAB — LIPID PANEL
CHOL/HDL RATIO: 4.2 (calc) (ref <5.0)
CHOLESTEROL: 169 mg/dL (ref <200)
HDL: 40 mg/dL — ABNORMAL LOW (ref >50)
LDL CHOLESTEROL (CALC): 107 mg/dL — AB
Non-HDL Cholesterol (Calc): 129 mg/dL (calc) (ref <130)
Triglycerides: 121 mg/dL (ref <150)

## 2018-04-26 LAB — CBC
HCT: 33.1 % — ABNORMAL LOW (ref 35.0–45.0)
HEMOGLOBIN: 10.4 g/dL — AB (ref 11.7–15.5)
MCH: 28 pg (ref 27.0–33.0)
MCHC: 31.4 g/dL — ABNORMAL LOW (ref 32.0–36.0)
MCV: 89.2 fL (ref 80.0–100.0)
MPV: 11.2 fL (ref 7.5–12.5)
Platelets: 200 10*3/uL (ref 140–400)
RBC: 3.71 10*6/uL — AB (ref 3.80–5.10)
RDW: 15.5 % — ABNORMAL HIGH (ref 11.0–15.0)
WBC: 4.6 10*3/uL (ref 3.8–10.8)

## 2018-04-26 LAB — TSH: TSH: 0.88 mIU/L (ref 0.40–4.50)

## 2018-04-26 LAB — MAGNESIUM: Magnesium: 1.7 mg/dL (ref 1.5–2.5)

## 2018-05-01 ENCOUNTER — Ambulatory Visit (INDEPENDENT_AMBULATORY_CARE_PROVIDER_SITE_OTHER): Payer: Medicare Other

## 2018-05-01 ENCOUNTER — Telehealth: Payer: Self-pay | Admitting: Cardiology

## 2018-05-01 DIAGNOSIS — I5022 Chronic systolic (congestive) heart failure: Secondary | ICD-10-CM | POA: Diagnosis not present

## 2018-05-01 DIAGNOSIS — Z950 Presence of cardiac pacemaker: Secondary | ICD-10-CM

## 2018-05-01 NOTE — Telephone Encounter (Signed)
Spoke with pt and reminded pt of remote transmission that is due today. Pt verbalized understanding.   

## 2018-05-02 ENCOUNTER — Telehealth: Payer: Self-pay

## 2018-05-02 NOTE — Progress Notes (Signed)
EPIC Encounter for ICM Monitoring  Patient Name: SKYLEA MEINE is a 62 y.o. female Date: 05/02/2018 Primary Care Physican: Kari Baars, MD PrimaryCardiologist: Wyline Mood Electrophysiologist: Fuller Mandril Weight:Previous [id number]  Bi-V Pacing: 95.4%       Attempted call to patient and unable to reach.  Left message to return call.  Transmission reviewed.    Thoracic impedance normal.  Prescribed: Furosemide20 mg take2 tablets (40 mgtotal)daily. Ifweight is above 146 lbs take an extra 20 mg.Patient takes Furosemide differently: She takes 1 tablet daily and occasionally takes the 2nd tablet.  LABS: 05/16/2017 Creatinine 0.97, BUN 19, Potassium 4.2, Sodium 141  Recommendations:  Unable to reach.  Follow-up plan: ICM clinic phone appointment on 06/05/2018.    Copy of ICM check sent to Dr. Ladona Ridgel.   3 month ICM trend: 05/02/2018    1 Year ICM trend:       [name], RN 05/02/2018 10:01 AM

## 2018-05-02 NOTE — Telephone Encounter (Signed)
Remote ICM transmission received.  Attempted call to patient and no answer   

## 2018-05-03 LAB — CUP PACEART REMOTE DEVICE CHECK
Battery Remaining Longevity: 20 mo
Brady Statistic AP VS Percent: 0 %
Brady Statistic AS VP Percent: 0 %
Brady Statistic AS VS Percent: 0 %
Brady Statistic RA Percent Paced: 0 %
Implantable Lead Implant Date: 20131120
Implantable Lead Implant Date: 20131120
Implantable Lead Location: 753859
Implantable Lead Location: 753860
Implantable Lead Model: 5076
Implantable Lead Model: 5076
Implantable Pulse Generator Implant Date: 20131120
Lead Channel Impedance Value: 304 Ohm
Lead Channel Impedance Value: 4047 Ohm
Lead Channel Impedance Value: 437 Ohm
Lead Channel Impedance Value: 551 Ohm
Lead Channel Pacing Threshold Amplitude: 0.5 V
Lead Channel Pacing Threshold Amplitude: 2.25 V
Lead Channel Pacing Threshold Pulse Width: 1 ms
Lead Channel Sensing Intrinsic Amplitude: 0.5 mV
Lead Channel Sensing Intrinsic Amplitude: 4.875 mV
Lead Channel Sensing Intrinsic Amplitude: 4.875 mV
Lead Channel Setting Pacing Amplitude: 2 V
Lead Channel Setting Pacing Amplitude: 3.25 V
Lead Channel Setting Pacing Pulse Width: 1 ms
Lead Channel Setting Sensing Sensitivity: 4 mV
MDC IDC LEAD IMPLANT DT: 20131120
MDC IDC LEAD LOCATION: 753858
MDC IDC MSMT BATTERY VOLTAGE: 2.91 V
MDC IDC MSMT LEADCHNL LV IMPEDANCE VALUE: 4047 Ohm
MDC IDC MSMT LEADCHNL LV IMPEDANCE VALUE: 4047 Ohm
MDC IDC MSMT LEADCHNL LV IMPEDANCE VALUE: 456 Ohm
MDC IDC MSMT LEADCHNL RA IMPEDANCE VALUE: 380 Ohm
MDC IDC MSMT LEADCHNL RA IMPEDANCE VALUE: 513 Ohm
MDC IDC MSMT LEADCHNL RA SENSING INTR AMPL: 0.5 mV
MDC IDC MSMT LEADCHNL RV PACING THRESHOLD PULSEWIDTH: 0.4 ms
MDC IDC SESS DTM: 20190912030440
MDC IDC SET LEADCHNL RV PACING PULSEWIDTH: 0.4 ms
MDC IDC STAT BRADY AP VP PERCENT: 0 %
MDC IDC STAT BRADY RV PERCENT PACED: 95.63 %

## 2018-05-12 DIAGNOSIS — Z23 Encounter for immunization: Secondary | ICD-10-CM | POA: Diagnosis not present

## 2018-05-24 DIAGNOSIS — M109 Gout, unspecified: Secondary | ICD-10-CM | POA: Diagnosis not present

## 2018-05-26 DIAGNOSIS — I509 Heart failure, unspecified: Secondary | ICD-10-CM | POA: Diagnosis not present

## 2018-05-26 DIAGNOSIS — M10032 Idiopathic gout, left wrist: Secondary | ICD-10-CM | POA: Diagnosis not present

## 2018-05-26 DIAGNOSIS — I110 Hypertensive heart disease with heart failure: Secondary | ICD-10-CM | POA: Diagnosis not present

## 2018-05-26 DIAGNOSIS — J449 Chronic obstructive pulmonary disease, unspecified: Secondary | ICD-10-CM | POA: Diagnosis not present

## 2018-05-26 DIAGNOSIS — Z950 Presence of cardiac pacemaker: Secondary | ICD-10-CM | POA: Diagnosis not present

## 2018-05-26 DIAGNOSIS — M109 Gout, unspecified: Secondary | ICD-10-CM | POA: Diagnosis not present

## 2018-05-31 DIAGNOSIS — I509 Heart failure, unspecified: Secondary | ICD-10-CM | POA: Diagnosis not present

## 2018-05-31 DIAGNOSIS — K219 Gastro-esophageal reflux disease without esophagitis: Secondary | ICD-10-CM | POA: Diagnosis not present

## 2018-05-31 DIAGNOSIS — G4700 Insomnia, unspecified: Secondary | ICD-10-CM | POA: Diagnosis not present

## 2018-05-31 DIAGNOSIS — Z1331 Encounter for screening for depression: Secondary | ICD-10-CM | POA: Diagnosis not present

## 2018-06-05 ENCOUNTER — Telehealth: Payer: Self-pay

## 2018-06-05 ENCOUNTER — Ambulatory Visit (INDEPENDENT_AMBULATORY_CARE_PROVIDER_SITE_OTHER): Payer: Medicare Other

## 2018-06-05 DIAGNOSIS — Z950 Presence of cardiac pacemaker: Secondary | ICD-10-CM | POA: Diagnosis not present

## 2018-06-05 DIAGNOSIS — I5022 Chronic systolic (congestive) heart failure: Secondary | ICD-10-CM

## 2018-06-05 NOTE — Telephone Encounter (Signed)
Spoke with pt and reminded pt of remote transmission that is due today. Pt verbalized understanding.   

## 2018-06-06 ENCOUNTER — Telehealth: Payer: Self-pay

## 2018-06-06 DIAGNOSIS — Z79899 Other long term (current) drug therapy: Secondary | ICD-10-CM | POA: Diagnosis not present

## 2018-06-06 NOTE — Progress Notes (Signed)
EPIC Encounter for ICM Monitoring  Patient Name: [name] [name] is a 62 y.o. female Date: 06/06/2018 Primary Care Physican: Kari Baars, MD PrimaryCardiologist: Wyline Mood Electrophysiologist: Ladona Ridgel Dry Weight:Previous [id number]  Bi-V Pacing: 96%     OBSERVATIONS (1)   3 monitored VT episodes, longest was 7 sec.             Attempted call to patient and unable to reach.  Left message to return call.   Transmission reviewed by device clinic RN regarding monitored VT episodes   Thoracic impedance normal.   Prescribed: Furosemide20 mg take2 tablets (40 mgtotal)daily. Ifweight is above 146 lbs take an extra 20 mg.Patient takes Furosemide differently: She takes 1 tablet daily and occasionally takes the 2nd tablet.  LABS: 04/25/2018 Creatinine 1.07, BUN 22, Potassium 4.6, Sodium 140  Recommendations: Unable to reach.  Follow-up plan: ICM clinic phone appointment on 07/12/2018.    Copy of ICM check sent to Dr. Ladona Ridgel.   3 month ICM trend: 06/05/2018    1 Year ICM trend:       [name], RN 06/06/2018 7:38 AM

## 2018-06-06 NOTE — Telephone Encounter (Signed)
Remote ICM transmission received.  Attempted call to patient regarding ICM remote transmission and left message, per DPR, to return call.    

## 2018-06-09 DIAGNOSIS — M19042 Primary osteoarthritis, left hand: Secondary | ICD-10-CM | POA: Diagnosis not present

## 2018-06-09 DIAGNOSIS — M109 Gout, unspecified: Secondary | ICD-10-CM | POA: Diagnosis not present

## 2018-06-15 NOTE — Progress Notes (Signed)
Attempted return call to patient as requested by voice mail message.  Left number for return call. 

## 2018-06-21 ENCOUNTER — Telehealth: Payer: Self-pay | Admitting: Cardiology

## 2018-06-21 MED ORDER — RIVAROXABAN 20 MG PO TABS: 20.0000 mg | ORAL_TABLET | Freq: Every day | ORAL | 1 refills | Status: DC

## 2018-06-21 NOTE — Telephone Encounter (Signed)
Done

## 2018-06-21 NOTE — Telephone Encounter (Signed)
Pt has changed pharmacies to   [facility]  [address] [place]   [place] # [phone]   She's needing all her Rx's sent there and she's out of her Xarelto, needing a refill

## 2018-07-06 DIAGNOSIS — M109 Gout, unspecified: Secondary | ICD-10-CM | POA: Diagnosis not present

## 2018-07-06 DIAGNOSIS — R2242 Localized swelling, mass and lump, left lower limb: Secondary | ICD-10-CM | POA: Diagnosis not present

## 2018-07-10 ENCOUNTER — Telehealth: Payer: Self-pay

## 2018-07-10 DIAGNOSIS — Z950 Presence of cardiac pacemaker: Secondary | ICD-10-CM | POA: Diagnosis not present

## 2018-07-10 DIAGNOSIS — Z7951 Long term (current) use of inhaled steroids: Secondary | ICD-10-CM | POA: Diagnosis not present

## 2018-07-10 DIAGNOSIS — Z7901 Long term (current) use of anticoagulants: Secondary | ICD-10-CM | POA: Diagnosis not present

## 2018-07-10 DIAGNOSIS — M25572 Pain in left ankle and joints of left foot: Secondary | ICD-10-CM | POA: Diagnosis not present

## 2018-07-10 DIAGNOSIS — I110 Hypertensive heart disease with heart failure: Secondary | ICD-10-CM | POA: Diagnosis not present

## 2018-07-10 DIAGNOSIS — M1990 Unspecified osteoarthritis, unspecified site: Secondary | ICD-10-CM | POA: Diagnosis not present

## 2018-07-10 DIAGNOSIS — S0083XA Contusion of other part of head, initial encounter: Secondary | ICD-10-CM | POA: Diagnosis not present

## 2018-07-10 DIAGNOSIS — Z9181 History of falling: Secondary | ICD-10-CM | POA: Diagnosis not present

## 2018-07-10 DIAGNOSIS — S52502A Unspecified fracture of the lower end of left radius, initial encounter for closed fracture: Secondary | ICD-10-CM | POA: Diagnosis not present

## 2018-07-10 DIAGNOSIS — S299XXA Unspecified injury of thorax, initial encounter: Secondary | ICD-10-CM | POA: Diagnosis not present

## 2018-07-10 DIAGNOSIS — K219 Gastro-esophageal reflux disease without esophagitis: Secondary | ICD-10-CM | POA: Diagnosis not present

## 2018-07-10 DIAGNOSIS — S0990XA Unspecified injury of head, initial encounter: Secondary | ICD-10-CM | POA: Diagnosis not present

## 2018-07-10 DIAGNOSIS — I509 Heart failure, unspecified: Secondary | ICD-10-CM | POA: Diagnosis not present

## 2018-07-10 DIAGNOSIS — J449 Chronic obstructive pulmonary disease, unspecified: Secondary | ICD-10-CM | POA: Diagnosis not present

## 2018-07-10 DIAGNOSIS — S52592A Other fractures of lower end of left radius, initial encounter for closed fracture: Secondary | ICD-10-CM | POA: Diagnosis not present

## 2018-07-10 DIAGNOSIS — I517 Cardiomegaly: Secondary | ICD-10-CM | POA: Diagnosis not present

## 2018-07-10 DIAGNOSIS — W19XXXA Unspecified fall, initial encounter: Secondary | ICD-10-CM | POA: Diagnosis not present

## 2018-07-10 DIAGNOSIS — Z79899 Other long term (current) drug therapy: Secondary | ICD-10-CM | POA: Diagnosis not present

## 2018-07-10 DIAGNOSIS — R0989 Other specified symptoms and signs involving the circulatory and respiratory systems: Secondary | ICD-10-CM | POA: Diagnosis not present

## 2018-07-10 DIAGNOSIS — R9082 White matter disease, unspecified: Secondary | ICD-10-CM | POA: Diagnosis not present

## 2018-07-10 DIAGNOSIS — F329 Major depressive disorder, single episode, unspecified: Secondary | ICD-10-CM | POA: Diagnosis not present

## 2018-07-10 NOTE — Telephone Encounter (Signed)
Attempted call back to patient as requested by voice mail message regarding a medication question.  No answer and left message was returning her call.

## 2018-07-10 NOTE — Telephone Encounter (Signed)
Attempted 2nd return call and straight to voice mail.

## 2018-07-12 ENCOUNTER — Ambulatory Visit (INDEPENDENT_AMBULATORY_CARE_PROVIDER_SITE_OTHER): Payer: Medicare Other

## 2018-07-12 ENCOUNTER — Telehealth: Payer: Self-pay | Admitting: Cardiology

## 2018-07-12 DIAGNOSIS — S52592A Other fractures of lower end of left radius, initial encounter for closed fracture: Secondary | ICD-10-CM | POA: Diagnosis not present

## 2018-07-12 DIAGNOSIS — I428 Other cardiomyopathies: Secondary | ICD-10-CM | POA: Diagnosis not present

## 2018-07-12 DIAGNOSIS — W19XXXA Unspecified fall, initial encounter: Secondary | ICD-10-CM | POA: Diagnosis not present

## 2018-07-12 DIAGNOSIS — I5022 Chronic systolic (congestive) heart failure: Secondary | ICD-10-CM

## 2018-07-12 NOTE — Telephone Encounter (Signed)
Spoke with pt and reminded pt of remote transmission that is due today. Pt verbalized understanding.   

## 2018-07-13 NOTE — Progress Notes (Signed)
Remote pacemaker transmission.   

## 2018-07-14 ENCOUNTER — Ambulatory Visit (INDEPENDENT_AMBULATORY_CARE_PROVIDER_SITE_OTHER): Payer: Medicare Other

## 2018-07-14 ENCOUNTER — Encounter: Payer: Self-pay | Admitting: Cardiology

## 2018-07-14 ENCOUNTER — Telehealth: Payer: Self-pay

## 2018-07-14 DIAGNOSIS — Z950 Presence of cardiac pacemaker: Secondary | ICD-10-CM | POA: Diagnosis not present

## 2018-07-14 DIAGNOSIS — I5022 Chronic systolic (congestive) heart failure: Secondary | ICD-10-CM | POA: Diagnosis not present

## 2018-07-14 NOTE — Telephone Encounter (Signed)
Remote ICM transmission received.  Attempted call to patient regarding ICM remote transmission and left message to return call   

## 2018-07-14 NOTE — Progress Notes (Signed)
EPIC Encounter for ICM Monitoring  Patient Name: [name] [name] is a 62 y.o. female Date: 07/14/2018 Primary Care Physican: System, Provider Not In PrimaryCardiologist: Branch Electrophysiologist: Rae Roam Pacing: 96%  Last Weight: 148lbs Today's Weight:  unknown  Clinical Status Since 05-Jun-2018   VT (>4 beats) 66    Longest: 13 sec  OBSERVATIONS (1)   7 monitored VT episodes, longest was 13 sec.   Attempted call to patient and unable to reach.  Left message to return call.  Transmission reviewed.    Thoracic impedance abnormal suggesting fluid accumulation.  Message sent to device triage to review report  Prescribed: Furosemide20 mg take2 tablets (40 mgtotal)daily. Ifweight is above 146 lbs take an extra 20 mg.Patient takes Furosemide differently: She takes 1 tablet daily and occasionally takes the 2nd tablet.  LABS: 04/25/2018 Creatinine 1.07, BUN 22, Potassium 4.6, Sodium 140  Recommendations: Unable to reach.  Follow-up plan: ICM clinic phone appointment on 07/18/2018 to recheck fluid levels.       Copy of ICM check sent to Dr. Ladona Ridgel and Dr Wyline Mood for reivew.   3 month ICM trend: 07/13/2018    1 Year ICM trend:       [name], RN 07/14/2018 8:12 AM

## 2018-07-17 ENCOUNTER — Telehealth: Payer: Self-pay | Admitting: Cardiology

## 2018-07-17 NOTE — Telephone Encounter (Signed)
Patient is needing RX for Albuterol sent to [facility], [place], [place], [place].  Phone number is [phone].  / tg

## 2018-07-18 ENCOUNTER — Ambulatory Visit (INDEPENDENT_AMBULATORY_CARE_PROVIDER_SITE_OTHER): Payer: Medicare Other

## 2018-07-18 ENCOUNTER — Telehealth: Payer: Self-pay

## 2018-07-18 DIAGNOSIS — I5022 Chronic systolic (congestive) heart failure: Secondary | ICD-10-CM

## 2018-07-18 DIAGNOSIS — Z950 Presence of cardiac pacemaker: Secondary | ICD-10-CM

## 2018-07-18 NOTE — Telephone Encounter (Signed)
Attempt to reach pharmacy, no answer, no machine

## 2018-07-18 NOTE — Telephone Encounter (Signed)
LMOVM reminding pt to send remote transmission.   

## 2018-07-19 DIAGNOSIS — R233 Spontaneous ecchymoses: Secondary | ICD-10-CM | POA: Diagnosis not present

## 2018-07-19 DIAGNOSIS — D649 Anemia, unspecified: Secondary | ICD-10-CM | POA: Diagnosis not present

## 2018-07-20 NOTE — Progress Notes (Signed)
EPIC Encounter for ICM Monitoring  Patient Name: [name] [name] is a 62 y.o. female Date: 07/20/2018 Primary Care Physican: System, Provider Not In PrimaryCardiologist: Branch Electrophysiologist: Rae Roam Pacing: 92%      Last Weight: 148lbs Today's Weight:  unknown  Since 13-Jul-2018 Monitored VT (>4 beats)   8   Longest:   14 sec       Transmission reviewed.    Thoracic impedance returned to normal.    Prescribed: Furosemide20 mg take2 tablets (40 mgtotal)daily. Ifweight is above 146 lbs take an extra 20 mg.Patient takes Furosemide differently: She takes 1 tablet daily and occasionally takes the 2nd tablet.  LABS: 09/24/2019Creatinine 1.07, BUN22, Potassium 4.6, Sodium 140  Recommendations: None  Follow-up plan: ICM clinic phone appointment on 08/21/2018.       Copy of ICM check sent to Dr. Ladona Ridgel and Dr Wyline Mood for reivew.    3 month ICM trend: 07/19/2018    1 Year ICM trend:       [name], RN 07/20/2018 4:18 PM

## 2018-08-03 DIAGNOSIS — D649 Anemia, unspecified: Secondary | ICD-10-CM | POA: Diagnosis not present

## 2018-08-03 DIAGNOSIS — Z79899 Other long term (current) drug therapy: Secondary | ICD-10-CM | POA: Diagnosis not present

## 2018-08-03 DIAGNOSIS — R233 Spontaneous ecchymoses: Secondary | ICD-10-CM | POA: Diagnosis not present

## 2018-08-03 DIAGNOSIS — M109 Gout, unspecified: Secondary | ICD-10-CM | POA: Diagnosis not present

## 2018-08-10 DIAGNOSIS — M25532 Pain in left wrist: Secondary | ICD-10-CM | POA: Diagnosis not present

## 2018-08-20 LAB — CUP PACEART REMOTE DEVICE CHECK
Battery Remaining Longevity: 15 mo
Battery Voltage: 2.89 V
Brady Statistic AS VP Percent: 0 %
Brady Statistic RA Percent Paced: 0 %
Date Time Interrogation Session: 20191212050314
Implantable Lead Implant Date: 20131120
Implantable Lead Implant Date: 20131120
Implantable Lead Location: 753858
Implantable Lead Location: 753859
Implantable Lead Location: 753860
Implantable Lead Model: 4196
Implantable Pulse Generator Implant Date: 20131120
Lead Channel Impedance Value: 4047 Ohm
Lead Channel Impedance Value: 4047 Ohm
Lead Channel Impedance Value: 513 Ohm
Lead Channel Pacing Threshold Amplitude: 0.5 V
Lead Channel Pacing Threshold Pulse Width: 0.4 ms
Lead Channel Pacing Threshold Pulse Width: 1 ms
Lead Channel Sensing Intrinsic Amplitude: 0.5 mV
Lead Channel Sensing Intrinsic Amplitude: 5 mV
Lead Channel Sensing Intrinsic Amplitude: 5 mV
Lead Channel Setting Pacing Amplitude: 2 V
Lead Channel Setting Pacing Pulse Width: 0.4 ms
Lead Channel Setting Pacing Pulse Width: 1 ms
MDC IDC LEAD IMPLANT DT: 20131120
MDC IDC MSMT LEADCHNL LV IMPEDANCE VALUE: 342 Ohm
MDC IDC MSMT LEADCHNL LV IMPEDANCE VALUE: 4047 Ohm
MDC IDC MSMT LEADCHNL LV IMPEDANCE VALUE: 456 Ohm
MDC IDC MSMT LEADCHNL LV PACING THRESHOLD AMPLITUDE: 2.125 V
MDC IDC MSMT LEADCHNL RA IMPEDANCE VALUE: 380 Ohm
MDC IDC MSMT LEADCHNL RA SENSING INTR AMPL: 0.5 mV
MDC IDC MSMT LEADCHNL RV IMPEDANCE VALUE: 475 Ohm
MDC IDC MSMT LEADCHNL RV IMPEDANCE VALUE: 570 Ohm
MDC IDC SET LEADCHNL LV PACING AMPLITUDE: 3.25 V
MDC IDC SET LEADCHNL RV SENSING SENSITIVITY: 4 mV
MDC IDC STAT BRADY AP VP PERCENT: 0 %
MDC IDC STAT BRADY AP VS PERCENT: 0 %
MDC IDC STAT BRADY AS VS PERCENT: 0 %
MDC IDC STAT BRADY RV PERCENT PACED: 93.26 %

## 2018-08-28 DIAGNOSIS — Z79899 Other long term (current) drug therapy: Secondary | ICD-10-CM | POA: Diagnosis not present

## 2018-08-28 DIAGNOSIS — M109 Gout, unspecified: Secondary | ICD-10-CM | POA: Diagnosis not present

## 2018-08-28 DIAGNOSIS — K219 Gastro-esophageal reflux disease without esophagitis: Secondary | ICD-10-CM | POA: Diagnosis not present

## 2018-08-28 DIAGNOSIS — D649 Anemia, unspecified: Secondary | ICD-10-CM | POA: Diagnosis not present

## 2018-08-28 DIAGNOSIS — G4700 Insomnia, unspecified: Secondary | ICD-10-CM | POA: Diagnosis not present

## 2018-08-31 ENCOUNTER — Other Ambulatory Visit: Payer: Self-pay | Admitting: Internal Medicine

## 2018-09-04 NOTE — Progress Notes (Signed)
No ICM remote transmission received for 08/21/2018 and next ICM transmission scheduled for 09/11/2018.   

## 2018-09-15 ENCOUNTER — Telehealth: Payer: Self-pay

## 2018-09-15 NOTE — Telephone Encounter (Signed)
Left message for patient to remind of missed remote transmission.  

## 2018-09-22 ENCOUNTER — Other Ambulatory Visit: Payer: Self-pay | Admitting: Cardiology

## 2018-09-22 NOTE — Progress Notes (Signed)
No ICM remote transmission received for 09/11/2018 and next ICM transmission scheduled for 10/10/2018.

## 2018-10-05 ENCOUNTER — Other Ambulatory Visit: Payer: Self-pay

## 2018-10-05 MED ORDER — FUROSEMIDE 40 MG PO TABS: ORAL_TABLET | ORAL | 3 refills | Status: DC

## 2018-10-05 NOTE — Telephone Encounter (Signed)
[place] [facility]

## 2018-10-10 ENCOUNTER — Telehealth: Payer: Self-pay

## 2018-10-10 ENCOUNTER — Ambulatory Visit: Payer: Medicare Other | Admitting: Cardiology

## 2018-10-10 NOTE — Telephone Encounter (Signed)
Left message for patient to remind of missed remote transmission.  

## 2018-10-11 ENCOUNTER — Encounter: Payer: Medicare Other | Admitting: *Deleted

## 2018-10-12 ENCOUNTER — Telehealth: Payer: Self-pay

## 2018-10-12 NOTE — Telephone Encounter (Signed)
Left message for patient to remind of missed remote transmission.  

## 2018-10-20 NOTE — Progress Notes (Signed)
No ICM remote transmission received for 10/01/2018 and next ICM transmission scheduled for 10/30/2018.

## 2018-10-30 ENCOUNTER — Other Ambulatory Visit: Payer: Self-pay

## 2018-10-31 ENCOUNTER — Telehealth: Payer: Self-pay

## 2018-10-31 NOTE — Telephone Encounter (Signed)
[id number]  Left message for patient to remind of missed remote transmission.

## 2018-11-10 NOTE — Progress Notes (Signed)
No ICM remote transmission received since 07/20/2018.  No further ICM monthly follow up scheduled since unable to reach patient.  Per protocol, patient will continue with 91 day device check with device clinic.

## 2018-11-21 ENCOUNTER — Other Ambulatory Visit: Payer: Self-pay | Admitting: Cardiology

## 2018-11-22 DIAGNOSIS — I509 Heart failure, unspecified: Secondary | ICD-10-CM | POA: Diagnosis not present

## 2018-11-24 ENCOUNTER — Telehealth: Payer: Self-pay | Admitting: Cardiology

## 2018-11-24 ENCOUNTER — Other Ambulatory Visit: Payer: Self-pay | Admitting: Cardiology

## 2018-11-24 NOTE — Telephone Encounter (Signed)
I already refilled it as by history we have in the past

## 2018-11-24 NOTE — Telephone Encounter (Signed)
°*  STAT* If patient is at the pharmacy, call can be transferred to refill team.   1. Which medications need to be refilled?SPIRIVA HANDIHALER 18 MCG inhalation capsule    2. Which pharmacy/location (including street and city if local pharmacy) is medication to be sent to? [facility]  [address] [place]   [phone]  3. Do they need a 30 day or 90 day supply?

## 2018-11-29 ENCOUNTER — Telehealth (INDEPENDENT_AMBULATORY_CARE_PROVIDER_SITE_OTHER): Payer: Medicare Other | Admitting: Cardiology

## 2018-11-29 ENCOUNTER — Encounter: Payer: Self-pay | Admitting: Cardiology

## 2018-11-29 VITALS — Ht 64.0 in | Wt 140.0 lb

## 2018-11-29 DIAGNOSIS — Z950 Presence of cardiac pacemaker: Secondary | ICD-10-CM | POA: Diagnosis not present

## 2018-11-29 DIAGNOSIS — I4891 Unspecified atrial fibrillation: Secondary | ICD-10-CM

## 2018-11-29 DIAGNOSIS — I428 Other cardiomyopathies: Secondary | ICD-10-CM

## 2018-11-29 DIAGNOSIS — I10 Essential (primary) hypertension: Secondary | ICD-10-CM | POA: Diagnosis not present

## 2018-11-29 DIAGNOSIS — I5022 Chronic systolic (congestive) heart failure: Secondary | ICD-10-CM | POA: Diagnosis not present

## 2018-11-29 MED ORDER — ALBUTEROL SULFATE HFA 108 (90 BASE) MCG/ACT IN AERS: 2.0000 | INHALATION_SPRAY | Freq: Four times a day (QID) | RESPIRATORY_TRACT | 2 refills | Status: DC | PRN

## 2018-11-29 NOTE — Progress Notes (Signed)
Virtual Visit via Video Note   This visit type was conducted due to national recommendations for restrictions regarding the COVID-19 Pandemic (e.g. social distancing) in an effort to limit this patient's exposure and mitigate transmission in our community.  Due to her co-morbid illnesses, this patient is at least at moderate risk for complications without adequate follow up.  This format is felt to be most appropriate for this patient at this time.  All issues noted in this document were discussed and addressed.  A limited physical exam was performed with this format.  Please refer to the patient's chart for her consent to telehealth for [name] [name].   Converted from video to telephone visit due to technically difficulties during visit.   Evaluation Performed:  Follow-up visit  Date:  11/29/2018   ID:  [name], [name] 06/02/1956, MRN [id number]  Patient Location: Home Provider Location: Office  PCP:  System, Provider Not In  Cardiologist:  Dina Rich, MD  Electrophysiologist:  Dr Sharrell Ku  Chief Complaint:  6 month follow up  History of Present Illness:    [name] [name] is a 63 y.o. female seen today for follow up of the following medical problems.   1. NICM  - echo 10/2013 LVEF 30-35%, restrictive diastolic dysfunction - cath Jan 2011 showed no obstructive CAD  - repeat echo 10/2015 LVEF 45-50%.  - 12/2017 LVEF 50-55%,     - reported weights today 140 lbs. Baseline around 140 to 142 lbs.  - no recent edema. No SOB/DOE.     2. Chronic afib  - prior AV nodal ablation with permanent pacemaker   - no recent palpitations - no bleeding on xarelto  3. Pacemaker - she has a Medtronic BiV pacemaker followed by Dr Ladona Ridgel, LV lead replaced epicardial by Dr Laneta Simmers 01/2015  - has not had recent device check   4. COPD - management per Dr Juanetta Gosling.   - ran out of albuterol.  - has had some recent allergies, congestion, SOB.   5. HTN - she is  compliant with meds     SH: Moved to [place], [place], [place], continues to come to [place] for cardiology and to visit her family        The patient does not have symptoms concerning for COVID-19 infection (fever, chills, cough, or new shortness of breath).    Past Medical History:  Diagnosis Date  . Anemia    2011: Hemoglobin of 11.4; hematocrit of 33.5; normal MCV  . Anxiety   . Anxiety and depression   . Atrial fibrillation (HCC)    Echocardiogram in 2004-borderline LV function; no valvular abnormalities; 06/2012: AV nodal ablation + biventricular pacemaker  . Cardiac arrest (HCC) 06/2012   16 day hospitalization; caused by hyperkalemia; multiple complications including CHF; permanent atrial fibrillation requiring AV nodal ablation + pacing  . CHF (congestive heart failure) (HCC)   . COPD (chronic obstructive pulmonary disease) (HCC)    heterozygous alpha-1 anti-trypsin deficiency  . Depression   . Dysrhythmia   . GERD (gastroesophageal reflux disease)    protonix  . Nonischemic cardiomyopathy (HCC)    Echo 06/2012: EF 30-35%, moderate to severe MR; cardiac cath 06/2012:30-40% D1 and D2, estimated EF-35%, MR-not graded; right bundle [name] [name] block  . Presence of permanent cardiac pacemaker   . Shortness of breath dyspnea   . Thyroid disease   . Urinary frequency    Past Surgical History:  Procedure Laterality Date  . AV NODE ABLATION N/A 06/21/2012  Procedure: AV NODE ABLATION;  Surgeon: Marinus Maw, MD;  Location: [name] CATH LAB;  Service: Cardiovascular;  Laterality: N/A;  . AV NODE ABLATION N/A 06/22/2012   Procedure: AV NODE ABLATION;  Surgeon: Duke Salvia, MD;  Location: [facility] CATH LAB;  Service: Cardiovascular;  Laterality: N/A;  . BREAST BIOPSY     left, [facility]  . BREAST EXCISIONAL BIOPSY     Left x2  . CARDIOVERSION  06/19/2012   Procedure: CARDIOVERSION;  Surgeon: Vesta Mixer, MD;  Location: [facility] ENDOSCOPY;  Service: Cardiovascular;;  . CARDIOVERSION   06/21/2012   Procedure: CARDIOVERSION;  Surgeon: Cassell Clement, MD;  Location: [facility] OR;  Service: Cardiovascular;  Laterality: N/A;  . CERVICAL CONIZATION W/BX  08/11/2011   Procedure: CONIZATION CERVIX WITH BIOPSY;  Surgeon: Lazaro Arms, MD;  Location: [facility] ORS;  Service: Gynecology;  Laterality: N/A;  Laser Conization of Cervix  . EPICARDIAL PACING LEAD PLACEMENT N/A 02/28/2015   Procedure: LV EPICARDIAL PACING LEAD PLACEMENT;  Surgeon: Alleen Borne, MD;  Location: [facility] OR;  Service: Thoracic;  Laterality: N/A;  . INSERT / REPLACE / REMOVE PACEMAKER    . LEFT HEART CATHETERIZATION WITH CORONARY ANGIOGRAM N/A 06/15/2012   Procedure: LEFT HEART CATHETERIZATION WITH CORONARY ANGIOGRAM;  Surgeon: Herby Abraham, MD;  Location: [facility] CATH LAB;  Service: Cardiovascular;  Laterality: N/A;  . PERMANENT PACEMAKER INSERTION N/A 06/21/2012   Procedure: PERMANENT PACEMAKER INSERTION;  Surgeon: Marinus Maw, MD;  Location: [facility] CATH LAB;  Service: Cardiovascular;  Laterality: N/A;  . TEE WITHOUT CARDIOVERSION  06/19/2012   Procedure: TRANSESOPHAGEAL ECHOCARDIOGRAM (TEE);  Surgeon: Vesta Mixer, MD;  Location: [facility] ENDOSCOPY;  Service: Cardiovascular;  Laterality: N/A;  . THORACOTOMY Left 02/28/2015   Procedure: THORACOTOMY MAJOR;  Surgeon: Alleen Borne, MD;  Location: [facility] OR;  Service: Thoracic;  Laterality: Left;     No outpatient medications have been marked as taking for the 11/29/18 encounter (Appointment) with [name], MD.     Allergies:   Patient has no known allergies.   Social History   Tobacco Use  . Smoking status: Never Smoker  . Smokeless tobacco: Never Used  Substance Use Topics  . Alcohol use: No    Alcohol/week: 0.0 standard drinks  . Drug use: No     Family Hx: The patient's family history includes Cancer in her father and sister; Cervical cancer in her mother; Irritable bowel syndrome in her unknown relative. There is no history of Anesthesia problems, Hypotension,  Malignant hyperthermia, Pseudochol deficiency, Colon cancer, Celiac disease, or Inflammatory bowel disease.  ROS:   Please see the history of present illness.     All other systems reviewed and are negative.   Prior CV studies:   The following studies were reviewed today:  06/2012 TEE: mod to severe MR, milld to mod TR   06/12/12 Cath Hemodynamics:  AO 118/68 (83)  LV 119/8  Cannot calculate gradient due to rapid atrial fib  Coronary angiography:  Coronary dominance: right  Left mainstem: Short without significant disease  Left anterior descending (LAD): There is calcification proximally. There are two major diagonal branches. Between the first and second diagonal branches are approximately 30-40% plaquing. The distal LAD has mild irregularity. Both diagonals have minimal irregularity.  Left circumflex (LCx): Provides one large bifurcation marginal Daryel Kenneth and a small AV circumflex. There is minor distal irregularity. The AV circ is small  Right coronary artery (RCA): Provides a large PDA and a large and smaller PLA  branches. No significant focal obstruction.  Left ventriculography: Due to ectopy, EF cannot be calculated. With AF, difficult to be sure of function but appears 35%. There does appear to be some MR.  Final Conclusions:  1. Atrial fib with rapid ventricular response.  2. Mild calcification of proximal LAD without critical obstruction. No high grade coronary artery disease.  09/2012 Echo: LVEF 25%, severely dilated LV, mild LVH, mild AI, mild MR   09/26/13 Clinic EKG Afib, V-paced  11/08/13 Echo Study Conclusions  - Left ventricle: The cavity size was moderately dilated. Wall thickness was increased in a pattern of mild LVH. Systolic function was moderately to severely reduced. The estimated ejection fraction was in the range of 30% to 35%. Diffuse hypokinesis. There is severe hypokinesis of the anteroseptal myocardium. Doppler parameters are  consistent with restrictive physiology, indicative of decreased left ventricular diastolic compliance and/or increased left atrial pressure. - Ventricular septum: Septal motion showed abnormal function and dyssynergy. - Aortic valve: Trileaflet; mildly thickened leaflets. Mild to moderate regurgitation. - Mitral valve: Mildly thickened leaflets . Moderate regurgitation, appears to be made up of two jets. Suboptimal PISA. - Left atrium: The atrium was severely dilated. - Right ventricle: The cavity size was mildly dilated. Pacer wire or catheter noted in right ventricle. - Right atrium: The atrium was moderately to severely dilated. - Tricuspid valve: Mild-moderate regurgitation. - Pulmonic valve: Mild regurgitation. - Pulmonary arteries: Systolic pressure was moderately increased. - Pericardium, extracardiac: There was no pericardial effusion. Impressions:  - Mild LVH with moderate chamber dilatation and LVEF 30-35% with diffuse hypokinesis, most prominent anteroseptal wall in setting of septal dyssynergy and pacing. Restrictive diastolic filling pattern with increased filling pressures. Severe left atrial and moderate to severe right atrial enlargement. MIldly thickened mitral valve with overall moderate mitral regurgitation made up of two jets (PISA suboptimal). Mild to moderate aortic regurgitation. Device wire in right heart. MIld to moderate tricuspid regurgitation with PASP 51 mmHg.   10/2015 echo Study Conclusions  - Left ventricle: The cavity size was normal. Systolic function was mildly reduced. The estimated ejection fraction was approximately 45%. Diffuse hypokinesis. Diastolic dysfunction, indeterminate grade and filling pressures. Mild septal thickening. - Aortic valve: Mildly to moderately calcified annulus. Trileaflet. There was mild to moderate regurgitation. - Mitral valve: There was moderate regurgitation. - Left atrium: The atrium was  severely dilated. - Right ventricle: Pacer wire or catheter noted in right ventricle. - Right atrium: The atrium was mildly to moderately dilated. - Tricuspid valve: There was mild regurgitation. - Pulmonic valve: There was mild regurgitation.  Labs/Other Tests and Data Reviewed:    EKG:  na  Recent Labs: 04/25/2018: BUN 22; Creat 1.07; Hemoglobin 10.4; Magnesium 1.7; Platelets 200; Potassium 4.6; Sodium 140; TSH 0.88   Recent Lipid Panel Lab Results  Component Value Date/Time   CHOL 169 04/25/2018 11:52 AM   TRIG 121 04/25/2018 11:52 AM   HDL 40 (L) 04/25/2018 11:52 AM   CHOLHDL 4.2 04/25/2018 11:52 AM   LDLCALC 107 (H) 04/25/2018 11:52 AM    Wt Readings from Last 3 Encounters:  04/21/18 147 lb (66.7 kg)  03/24/18 145 lb 9.6 oz (66 kg)  10/13/17 140 lb (63.5 kg)     Objective:    Vital Signs:  There were no vitals taken for this visit.   Normal affect. Normal speech pattern and tone. Comfortable, no apparent distress. No audible signs of SOb or wheezing.   ASSESSMENT & PLAN:    1. NICM/Chronic systolic HF -  weights are stable, no significant edema. Recent SOB I think is related to allergies and COPD, running out of rescue inhaler - continue current meds  2. Afib  - hx of av nodal ablation with pacemaker  - CHADS2Vasc score is 4, she will continue anticoag - doing well without symptoms, continue current meds   3. Pacemaker - will ask device clinic to contact her to get up to date on device checks   4. HTN - continue current meds  5. COPD - we refilled her prn albuterol. Follow symptoms with use, if ongoing symptoms defer any further changes to pcp   COVID-19 Education: The signs and symptoms of COVID-19 were discussed with the patient and how to seek care for testing (follow up with PCP or arrange E-visit).  The importance of social distancing was discussed today.  Time:   Today, I have spent 18 minutes with the patient with telehealth technology  discussing the above problems.     Medication Adjustments/Labs and Tests Ordered: Current medicines are reviewed at length with the patient today.  Concerns regarding medicines are outlined above.   Tests Ordered: No orders of the defined types were placed in this encounter.   Medication Changes: No orders of the defined types were placed in this encounter.   Disposition:  Follow up 6 months  Signed, Dina Rich, MD  11/29/2018 11:45 AM    [facility]

## 2018-11-29 NOTE — Patient Instructions (Signed)
Your physician wants you to follow-up in: 6 MONTHS WITH DR [facility] You will receive a reminder letter in the mail two months in advance. If you don't receive a letter, please call our office to schedule the follow-up appointment.  Your physician recommends that you continue on your current medications as directed. Please refer to the Current Medication list given to you today.  WE HAVE REFILLED YOUR INHALER ALBUTEROL   WE WILL SCHEDULE A REMOTE CHECK FOR YOUR DEVICE  Thank you for choosing [facility]!!

## 2018-12-05 DIAGNOSIS — N950 Postmenopausal bleeding: Secondary | ICD-10-CM | POA: Diagnosis not present

## 2018-12-06 DIAGNOSIS — I509 Heart failure, unspecified: Secondary | ICD-10-CM | POA: Diagnosis not present

## 2018-12-12 ENCOUNTER — Other Ambulatory Visit: Payer: Self-pay

## 2018-12-12 ENCOUNTER — Ambulatory Visit (INDEPENDENT_AMBULATORY_CARE_PROVIDER_SITE_OTHER): Payer: Medicare Other | Admitting: *Deleted

## 2018-12-12 DIAGNOSIS — I5022 Chronic systolic (congestive) heart failure: Secondary | ICD-10-CM

## 2018-12-12 DIAGNOSIS — I428 Other cardiomyopathies: Secondary | ICD-10-CM

## 2018-12-13 ENCOUNTER — Other Ambulatory Visit: Payer: Self-pay | Admitting: Cardiology

## 2018-12-13 ENCOUNTER — Telehealth: Payer: Self-pay

## 2018-12-13 ENCOUNTER — Other Ambulatory Visit: Payer: Self-pay | Admitting: Internal Medicine

## 2018-12-13 ENCOUNTER — Telehealth: Payer: Self-pay | Admitting: Cardiology

## 2018-12-13 MED ORDER — LISINOPRIL 5 MG PO TABS: 5.0000 mg | ORAL_TABLET | Freq: Every day | ORAL | 3 refills | Status: DC

## 2018-12-13 NOTE — Telephone Encounter (Signed)
Pt last saw Dr Wyline Mood telemedicine 11/29/18 COVID-19, last labs 06/06/18 Creat 0.95, age 63, weight 66.7kg, CrCl 63.82, based on CrCl pt is on appropriate dosage of Xarelto 20mg  QD.  Will refill rx.

## 2018-12-13 NOTE — Telephone Encounter (Signed)
Needing refill on lisinopril (PRINIVIL,ZESTRIL) 5 MG tablet [[id number]]  Sent to [facility] in [place]  PLEASE change pt's pharmacy to [facility]

## 2018-12-13 NOTE — Telephone Encounter (Signed)
Spoke with patient to remind of missed remote transmission 

## 2018-12-13 NOTE — Telephone Encounter (Signed)
62yo, 140lb, Scr 0.95 on 06/06/18, Crcl 61ml/min Last OV 11/29/18 Indication afib

## 2018-12-14 LAB — CUP PACEART REMOTE DEVICE CHECK
Battery Remaining Longevity: 10 mo
Battery Voltage: 2.86 V
Brady Statistic AP VP Percent: 0 %
Brady Statistic AP VS Percent: 0 %
Brady Statistic AS VP Percent: 0 %
Brady Statistic AS VS Percent: 0 %
Brady Statistic RA Percent Paced: 0 %
Brady Statistic RV Percent Paced: 91.73 %
Date Time Interrogation Session: 20200514024602
Implantable Lead Implant Date: 20131120
Implantable Lead Implant Date: 20131120
Implantable Lead Implant Date: 20131120
Implantable Lead Location: 753858
Implantable Lead Location: 753859
Implantable Lead Location: 753860
Implantable Lead Model: 4196
Implantable Lead Model: 5076
Implantable Lead Model: 5076
Implantable Pulse Generator Implant Date: 20131120
Lead Channel Impedance Value: 323 Ohm
Lead Channel Impedance Value: 4047 Ohm
Lead Channel Impedance Value: 4047 Ohm
Lead Channel Impedance Value: 4047 Ohm
Lead Channel Impedance Value: 494 Ohm
Lead Channel Impedance Value: 494 Ohm
Lead Channel Impedance Value: 513 Ohm
Lead Channel Impedance Value: 608 Ohm
Lead Channel Pacing Threshold Amplitude: 0.5 V
Lead Channel Pacing Threshold Amplitude: 2.375 V
Lead Channel Pacing Threshold Pulse Width: 0.4 ms
Lead Channel Pacing Threshold Pulse Width: 1 ms
Lead Channel Sensing Intrinsic Amplitude: 0.5 mV
Lead Channel Sensing Intrinsic Amplitude: 5.375 mV
Lead Channel Setting Pacing Amplitude: 2 V
Lead Channel Setting Pacing Amplitude: 3.5 V
Lead Channel Setting Pacing Pulse Width: 0.4 ms
Lead Channel Setting Pacing Pulse Width: 1 ms
Lead Channel Setting Sensing Sensitivity: 4 mV

## 2018-12-20 ENCOUNTER — Telehealth: Payer: Self-pay

## 2018-12-20 NOTE — Telephone Encounter (Signed)
Spoke with patient regarding ERI alert. Informed patient of beginning monthly battery checks. Pt aware of next manual transmission on 01/15/19. Pt denies further questions or concerns at this time.

## 2018-12-26 NOTE — Progress Notes (Signed)
Remote pacemaker transmission.   

## 2019-01-15 ENCOUNTER — Ambulatory Visit (INDEPENDENT_AMBULATORY_CARE_PROVIDER_SITE_OTHER): Payer: Medicare Other | Admitting: *Deleted

## 2019-01-15 DIAGNOSIS — Z950 Presence of cardiac pacemaker: Secondary | ICD-10-CM

## 2019-01-16 ENCOUNTER — Telehealth: Payer: Self-pay

## 2019-01-16 NOTE — Telephone Encounter (Signed)
Left message for patient to remind of missed remote transmission.  

## 2019-01-17 LAB — CUP PACEART REMOTE DEVICE CHECK
Battery Remaining Longevity: 6 mo
Battery Voltage: 2.86 V
Brady Statistic AP VP Percent: 0 %
Brady Statistic AP VS Percent: 0 %
Brady Statistic AS VP Percent: 0 %
Brady Statistic AS VS Percent: 0 %
Brady Statistic RA Percent Paced: 0 %
Brady Statistic RV Percent Paced: 92.17 %
Date Time Interrogation Session: 20200617031007
Implantable Lead Implant Date: 20131120
Implantable Lead Implant Date: 20131120
Implantable Lead Implant Date: 20131120
Implantable Lead Location: 753858
Implantable Lead Location: 753859
Implantable Lead Location: 753860
Implantable Lead Model: 4196
Implantable Lead Model: 5076
Implantable Lead Model: 5076
Implantable Pulse Generator Implant Date: 20131120
Lead Channel Impedance Value: 323 Ohm
Lead Channel Impedance Value: 380 Ohm
Lead Channel Impedance Value: 4047 Ohm
Lead Channel Impedance Value: 4047 Ohm
Lead Channel Impedance Value: 4047 Ohm
Lead Channel Impedance Value: 475 Ohm
Lead Channel Impedance Value: 475 Ohm
Lead Channel Impedance Value: 513 Ohm
Lead Channel Impedance Value: 589 Ohm
Lead Channel Pacing Threshold Amplitude: 0.375 V
Lead Channel Pacing Threshold Amplitude: 2.125 V
Lead Channel Pacing Threshold Pulse Width: 0.4 ms
Lead Channel Pacing Threshold Pulse Width: 1 ms
Lead Channel Sensing Intrinsic Amplitude: 0.5 mV
Lead Channel Sensing Intrinsic Amplitude: 0.5 mV
Lead Channel Sensing Intrinsic Amplitude: 5.875 mV
Lead Channel Sensing Intrinsic Amplitude: 5.875 mV
Lead Channel Setting Pacing Amplitude: 2 V
Lead Channel Setting Pacing Amplitude: 3.25 V
Lead Channel Setting Pacing Pulse Width: 0.4 ms
Lead Channel Setting Pacing Pulse Width: 1 ms
Lead Channel Setting Sensing Sensitivity: 4 mV

## 2019-01-22 ENCOUNTER — Encounter: Payer: Self-pay | Admitting: Cardiology

## 2019-01-22 DIAGNOSIS — M109 Gout, unspecified: Secondary | ICD-10-CM | POA: Diagnosis not present

## 2019-01-22 NOTE — Addendum Note (Signed)
Addended by: Tiajuana Amass on: 01/22/2019 07:05 AM   Modules accepted: Level of Service

## 2019-01-22 NOTE — Progress Notes (Signed)
Remote pacemaker transmission.   

## 2019-02-06 ENCOUNTER — Telehealth: Payer: Self-pay | Admitting: Cardiology

## 2019-02-06 NOTE — Telephone Encounter (Signed)
Patient would like to know when she will have the battery in her pacemaker changed

## 2019-02-07 ENCOUNTER — Telehealth: Payer: Self-pay

## 2019-02-07 NOTE — Telephone Encounter (Signed)
Spoke with patient. Advised that PPM battery is estimated at about 6 months remaining. Plan to continue monthly battery checks to keep close eye on the battery. Advised pt she does not have an auditory alert tone because her device is a CRT-P. Reassured her that we will contact her when ERI is reached. Next transmission on 02/22/19. Pt verbalizes understanding and agreement with plan.

## 2019-02-07 NOTE — Progress Notes (Signed)
error 

## 2019-02-07 NOTE — Telephone Encounter (Signed)
Received call from patient.  She asked how long it will be before the device battery will be changes.  Explained medtronic report estimates 6 months remaining on battery.  Explained she will hear alarm sound like a siren when time for battery replacement and there is 3 months remaining after she hears the alarm.  Advised to call [facility] device clinic for future questions or Dr Nelly Laurence office when needed.  Explained I was unable to continue with monthly ICM checks due the reports were not sent at scheduled times and reminders calls to her were made for last 6 months.  She said that is fine and understands. Explained device monitoring will continue every 3 months instead of monthly.

## 2019-02-12 DIAGNOSIS — I10 Essential (primary) hypertension: Secondary | ICD-10-CM | POA: Diagnosis not present

## 2019-02-22 ENCOUNTER — Encounter: Payer: Medicare Other | Admitting: *Deleted

## 2019-02-26 ENCOUNTER — Telehealth: Payer: Self-pay

## 2019-02-26 NOTE — Telephone Encounter (Signed)
Left message for patient to remind of missed remote transmission.  

## 2019-03-14 DIAGNOSIS — I10 Essential (primary) hypertension: Secondary | ICD-10-CM | POA: Diagnosis not present

## 2019-03-14 DIAGNOSIS — R079 Chest pain, unspecified: Secondary | ICD-10-CM | POA: Diagnosis not present

## 2019-03-26 ENCOUNTER — Ambulatory Visit (INDEPENDENT_AMBULATORY_CARE_PROVIDER_SITE_OTHER): Payer: Medicare Other | Admitting: *Deleted

## 2019-03-26 DIAGNOSIS — I429 Cardiomyopathy, unspecified: Secondary | ICD-10-CM

## 2019-03-26 DIAGNOSIS — I469 Cardiac arrest, cause unspecified: Secondary | ICD-10-CM

## 2019-03-27 LAB — CUP PACEART REMOTE DEVICE CHECK
Battery Remaining Longevity: 6 mo
Battery Voltage: 2.84 V
Brady Statistic AP VP Percent: 0 %
Brady Statistic AP VS Percent: 0 %
Brady Statistic AS VP Percent: 0 %
Brady Statistic AS VS Percent: 0 %
Brady Statistic RA Percent Paced: 0 %
Brady Statistic RV Percent Paced: 97.04 %
Date Time Interrogation Session: 20200825020252
Implantable Lead Implant Date: 20131120
Implantable Lead Implant Date: 20131120
Implantable Lead Implant Date: 20131120
Implantable Lead Location: 753858
Implantable Lead Location: 753859
Implantable Lead Location: 753860
Implantable Lead Model: 4196
Implantable Lead Model: 5076
Implantable Lead Model: 5076
Implantable Pulse Generator Implant Date: 20131120
Lead Channel Impedance Value: 323 Ohm
Lead Channel Impedance Value: 380 Ohm
Lead Channel Impedance Value: 4047 Ohm
Lead Channel Impedance Value: 4047 Ohm
Lead Channel Impedance Value: 4047 Ohm
Lead Channel Impedance Value: 456 Ohm
Lead Channel Impedance Value: 456 Ohm
Lead Channel Impedance Value: 513 Ohm
Lead Channel Impedance Value: 551 Ohm
Lead Channel Pacing Threshold Amplitude: 0.5 V
Lead Channel Pacing Threshold Amplitude: 2.25 V
Lead Channel Pacing Threshold Pulse Width: 0.4 ms
Lead Channel Pacing Threshold Pulse Width: 1 ms
Lead Channel Sensing Intrinsic Amplitude: 0.5 mV
Lead Channel Sensing Intrinsic Amplitude: 0.5 mV
Lead Channel Sensing Intrinsic Amplitude: 5.125 mV
Lead Channel Sensing Intrinsic Amplitude: 5.125 mV
Lead Channel Setting Pacing Amplitude: 2 V
Lead Channel Setting Pacing Amplitude: 3.25 V
Lead Channel Setting Pacing Pulse Width: 0.4 ms
Lead Channel Setting Pacing Pulse Width: 1 ms
Lead Channel Setting Sensing Sensitivity: 4 mV

## 2019-04-02 NOTE — Progress Notes (Signed)
Remote pacemaker transmission.   

## 2019-04-26 ENCOUNTER — Ambulatory Visit (INDEPENDENT_AMBULATORY_CARE_PROVIDER_SITE_OTHER): Payer: Medicare Other | Admitting: *Deleted

## 2019-04-26 DIAGNOSIS — I429 Cardiomyopathy, unspecified: Secondary | ICD-10-CM

## 2019-04-26 LAB — CUP PACEART REMOTE DEVICE CHECK
Battery Remaining Longevity: 5 mo
Battery Voltage: 2.84 V
Brady Statistic AP VP Percent: 0 %
Brady Statistic AP VS Percent: 0 %
Brady Statistic AS VP Percent: 0 %
Brady Statistic AS VS Percent: 0 %
Brady Statistic RA Percent Paced: 0 %
Brady Statistic RV Percent Paced: 96.89 %
Date Time Interrogation Session: 20200924024445
Implantable Lead Implant Date: 20131120
Implantable Lead Implant Date: 20131120
Implantable Lead Implant Date: 20131120
Implantable Lead Location: 753858
Implantable Lead Location: 753859
Implantable Lead Location: 753860
Implantable Lead Model: 4196
Implantable Lead Model: 5076
Implantable Lead Model: 5076
Implantable Pulse Generator Implant Date: 20131120
Lead Channel Impedance Value: 304 Ohm
Lead Channel Impedance Value: 4047 Ohm
Lead Channel Impedance Value: 4047 Ohm
Lead Channel Impedance Value: 4047 Ohm
Lead Channel Impedance Value: 437 Ohm
Lead Channel Impedance Value: 456 Ohm
Lead Channel Impedance Value: 513 Ohm
Lead Channel Impedance Value: 532 Ohm
Lead Channel Pacing Threshold Amplitude: 0.5 V
Lead Channel Pacing Threshold Amplitude: 2.125 V
Lead Channel Pacing Threshold Pulse Width: 0.4 ms
Lead Channel Pacing Threshold Pulse Width: 1 ms
Lead Channel Sensing Intrinsic Amplitude: 0.5 mV
Lead Channel Sensing Intrinsic Amplitude: 6.625 mV
Lead Channel Setting Pacing Amplitude: 2 V
Lead Channel Setting Pacing Amplitude: 3.25 V
Lead Channel Setting Pacing Pulse Width: 0.4 ms
Lead Channel Setting Pacing Pulse Width: 1 ms
Lead Channel Setting Sensing Sensitivity: 4 mV

## 2019-04-30 ENCOUNTER — Encounter: Payer: Medicare Other | Admitting: Internal Medicine

## 2019-05-02 ENCOUNTER — Encounter: Payer: Self-pay | Admitting: Cardiology

## 2019-05-02 NOTE — Progress Notes (Signed)
Remote pacemaker transmission.   

## 2019-05-28 ENCOUNTER — Ambulatory Visit (INDEPENDENT_AMBULATORY_CARE_PROVIDER_SITE_OTHER): Payer: Medicare Other | Admitting: *Deleted

## 2019-05-28 DIAGNOSIS — I469 Cardiac arrest, cause unspecified: Secondary | ICD-10-CM

## 2019-05-28 DIAGNOSIS — I5022 Chronic systolic (congestive) heart failure: Secondary | ICD-10-CM | POA: Diagnosis not present

## 2019-05-29 LAB — CUP PACEART REMOTE DEVICE CHECK
Battery Remaining Longevity: 4 mo
Battery Voltage: 2.83 V
Brady Statistic AP VP Percent: 0 %
Brady Statistic AP VS Percent: 0 %
Brady Statistic AS VP Percent: 0 %
Brady Statistic AS VS Percent: 0 %
Brady Statistic RA Percent Paced: 0 %
Brady Statistic RV Percent Paced: 95.6 %
Date Time Interrogation Session: 20201027205509
Implantable Lead Implant Date: 20131120
Implantable Lead Implant Date: 20131120
Implantable Lead Implant Date: 20131120
Implantable Lead Location: 753858
Implantable Lead Location: 753859
Implantable Lead Location: 753860
Implantable Lead Model: 4196
Implantable Lead Model: 5076
Implantable Lead Model: 5076
Implantable Pulse Generator Implant Date: 20131120
Lead Channel Impedance Value: 323 Ohm
Lead Channel Impedance Value: 380 Ohm
Lead Channel Impedance Value: 4047 Ohm
Lead Channel Impedance Value: 4047 Ohm
Lead Channel Impedance Value: 4047 Ohm
Lead Channel Impedance Value: 437 Ohm
Lead Channel Impedance Value: 456 Ohm
Lead Channel Impedance Value: 513 Ohm
Lead Channel Impedance Value: 551 Ohm
Lead Channel Pacing Threshold Amplitude: 0.5 V
Lead Channel Pacing Threshold Amplitude: 2 V
Lead Channel Pacing Threshold Pulse Width: 0.4 ms
Lead Channel Pacing Threshold Pulse Width: 1 ms
Lead Channel Sensing Intrinsic Amplitude: 0.5 mV
Lead Channel Sensing Intrinsic Amplitude: 0.5 mV
Lead Channel Sensing Intrinsic Amplitude: 5 mV
Lead Channel Sensing Intrinsic Amplitude: 5 mV
Lead Channel Setting Pacing Amplitude: 2 V
Lead Channel Setting Pacing Amplitude: 3 V
Lead Channel Setting Pacing Pulse Width: 0.4 ms
Lead Channel Setting Pacing Pulse Width: 1 ms
Lead Channel Setting Sensing Sensitivity: 4 mV

## 2019-06-01 ENCOUNTER — Ambulatory Visit: Payer: Medicare Other | Admitting: Cardiology

## 2019-06-01 ENCOUNTER — Other Ambulatory Visit: Payer: Self-pay | Admitting: Cardiology

## 2019-06-07 DIAGNOSIS — Z01419 Encounter for gynecological examination (general) (routine) without abnormal findings: Secondary | ICD-10-CM | POA: Diagnosis not present

## 2019-06-07 DIAGNOSIS — Z1151 Encounter for screening for human papillomavirus (HPV): Secondary | ICD-10-CM | POA: Diagnosis not present

## 2019-06-07 DIAGNOSIS — Z1272 Encounter for screening for malignant neoplasm of vagina: Secondary | ICD-10-CM | POA: Diagnosis not present

## 2019-06-07 DIAGNOSIS — N950 Postmenopausal bleeding: Secondary | ICD-10-CM | POA: Diagnosis not present

## 2019-06-11 ENCOUNTER — Other Ambulatory Visit: Payer: Self-pay | Admitting: Cardiology

## 2019-06-11 ENCOUNTER — Telehealth: Payer: Self-pay | Admitting: Cardiology

## 2019-06-11 NOTE — Telephone Encounter (Signed)
°*  STAT* If patient is at the pharmacy, call can be transferred to refill team.   1. Which medications need to be refilled? SPIRIVA HANDIHALER 18 MCG inhalation capsule   2. Which pharmacy/location (including street and city if local pharmacy) is medication to be sent to? [facility]  3. Do they need a 30 day or 90 day supply?

## 2019-06-12 NOTE — Telephone Encounter (Signed)
Ok to refill spiriva  Zandra Abts MD

## 2019-06-14 NOTE — Progress Notes (Addendum)
Remote pacemaker transmission.   

## 2019-06-21 ENCOUNTER — Encounter: Payer: Self-pay | Admitting: Cardiology

## 2019-06-21 ENCOUNTER — Telehealth (INDEPENDENT_AMBULATORY_CARE_PROVIDER_SITE_OTHER): Payer: Medicare Other | Admitting: Cardiology

## 2019-06-21 VITALS — BP 150/78 | HR 75 | Ht 64.0 in

## 2019-06-21 DIAGNOSIS — I428 Other cardiomyopathies: Secondary | ICD-10-CM | POA: Diagnosis not present

## 2019-06-21 DIAGNOSIS — Z950 Presence of cardiac pacemaker: Secondary | ICD-10-CM

## 2019-06-21 DIAGNOSIS — I4891 Unspecified atrial fibrillation: Secondary | ICD-10-CM

## 2019-06-21 DIAGNOSIS — I10 Essential (primary) hypertension: Secondary | ICD-10-CM

## 2019-06-21 NOTE — Patient Instructions (Signed)

## 2019-06-21 NOTE — Progress Notes (Signed)
Virtual Visit via Telephone Note   This visit type was conducted due to national recommendations for restrictions regarding the COVID-19 Pandemic (e.g. social distancing) in an effort to limit this patient's exposure and mitigate transmission in our community.  Due to her co-morbid illnesses, this patient is at least at moderate risk for complications without adequate follow up.  This format is felt to be most appropriate for this patient at this time.  The patient did not have access to video technology/had technical difficulties with video requiring transitioning to audio format only (telephone).  All issues noted in this document were discussed and addressed.  No physical exam could be performed with this format.  Please refer to the patient's chart for her  consent to telehealth for [facility].   Date:  06/21/2019   ID:  [name], [name] 1955/11/06, MRN [id number]  Patient Location: Home Provider Location: Office  PCP:  System, Provider Not In  Cardiologist:  Carlyle Dolly, MD  Electrophysiologist:  None   Evaluation Performed:  Follow-Up Visit  Chief Complaint:  Follow up  History of Present Illness:    [name] [name] is a 64 y.o. female seen today for follow up of the following medical problems.   1. NICM  - echo 10/2013 LVEF 30-35%, restrictive diastolic dysfunction - cath Jan 2011 showed no obstructive CAD  - repeat echo 10/2015 LVEF 45-50%. - 12/2017 LVEF 50-55%,   - Chronic SOB better with inhalers. No recent edema. Home weights stable 145 lbs.     2. Chronic afib  - prior AV nodal ablation with permanent pacemaker   -no recent palpitations. No bleeding on xarelto    3. Pacemaker - she has a Medtronic BiV pacemaker followed by Dr Lovena Le, LV lead replaced epicardial by Dr Cyndia Bent 01/2015  - followed by Dr Lovena Le, has appt early Dec  4. COPD - management per Dr Luan Pulling.    5. HTN - has not taken med yets today    SH:  Moved to [place], [place], continues to come to [place] for cardiology and to visit her family     The patient does not have symptoms concerning for COVID-19 infection (fever, chills, cough, or new shortness of breath).    Past Medical History:  Diagnosis Date  . Anemia    2011: Hemoglobin of 11.4; hematocrit of 33.5; normal MCV  . Anxiety   . Anxiety and depression   . Atrial fibrillation (HCC)    Echocardiogram in 2004-borderline LV function; no valvular abnormalities; 06/2012: AV nodal ablation + biventricular pacemaker  . Cardiac arrest ([place]) 06/2012   16 day hospitalization; caused by hyperkalemia; multiple complications including CHF; permanent atrial fibrillation requiring AV nodal ablation + pacing  . CHF (congestive heart failure) ([place])   . COPD (chronic obstructive pulmonary disease) (HCC)    heterozygous alpha-1 anti-trypsin deficiency  . Depression   . Dysrhythmia   . GERD (gastroesophageal reflux disease)    protonix  . Nonischemic cardiomyopathy ([place])    Echo 06/2012: EF 30-35%, moderate to severe MR; cardiac cath 06/2012:30-40% D1 and D2, estimated EF-35%, MR-not graded; right bundle Elby Blackwelder block  . Presence of permanent cardiac pacemaker   . Shortness of breath dyspnea   . Thyroid disease   . Urinary frequency    Past Surgical History:  Procedure Laterality Date  . AV NODE ABLATION N/A 06/21/2012   Procedure: AV NODE ABLATION;  Surgeon: Evans Lance, MD;  Location: [facility] CATH LAB;  Service: Cardiovascular;  Laterality:  N/A;  . AV NODE ABLATION N/A 06/22/2012   Procedure: AV NODE ABLATION;  Surgeon: Duke Salvia, MD;  Location: [facility] CATH LAB;  Service: Cardiovascular;  Laterality: N/A;  . BREAST BIOPSY     left, [facility]  . BREAST EXCISIONAL BIOPSY     Left x2  . CARDIOVERSION  06/19/2012   Procedure: CARDIOVERSION;  Surgeon: Vesta Mixer, MD;  Location: [facility] ENDOSCOPY;  Service: Cardiovascular;;  . CARDIOVERSION  06/21/2012   Procedure: CARDIOVERSION;  Surgeon:  Cassell Clement, MD;  Location: [facility] OR;  Service: Cardiovascular;  Laterality: N/A;  . CERVICAL CONIZATION W/BX  08/11/2011   Procedure: CONIZATION CERVIX WITH BIOPSY;  Surgeon: Lazaro Arms, MD;  Location: [facility] ORS;  Service: Gynecology;  Laterality: N/A;  Laser Conization of Cervix  . EPICARDIAL PACING LEAD PLACEMENT N/A 02/28/2015   Procedure: LV EPICARDIAL PACING LEAD PLACEMENT;  Surgeon: Alleen Borne, MD;  Location: [facility] OR;  Service: Thoracic;  Laterality: N/A;  . INSERT / REPLACE / REMOVE PACEMAKER    . LEFT HEART CATHETERIZATION WITH CORONARY ANGIOGRAM N/A 06/15/2012   Procedure: LEFT HEART CATHETERIZATION WITH CORONARY ANGIOGRAM;  Surgeon: Herby Abraham, MD;  Location: [facility] CATH LAB;  Service: Cardiovascular;  Laterality: N/A;  . PERMANENT PACEMAKER INSERTION N/A 06/21/2012   Procedure: PERMANENT PACEMAKER INSERTION;  Surgeon: Marinus Maw, MD;  Location: [facility] CATH LAB;  Service: Cardiovascular;  Laterality: N/A;  . TEE WITHOUT CARDIOVERSION  06/19/2012   Procedure: TRANSESOPHAGEAL ECHOCARDIOGRAM (TEE);  Surgeon: Vesta Mixer, MD;  Location: [facility] ENDOSCOPY;  Service: Cardiovascular;  Laterality: N/A;  . THORACOTOMY Left 02/28/2015   Procedure: THORACOTOMY MAJOR;  Surgeon: Alleen Borne, MD;  Location: [facility] OR;  Service: Thoracic;  Laterality: Left;     Current Meds  Medication Sig  . albuterol (VENTOLIN HFA) 108 (90 Base) MCG/ACT inhaler Inhale 2 puffs into the lungs every 6 (six) hours as needed for wheezing or shortness of breath.  . ALPRAZolam (XANAX) 0.25 MG tablet Take 0.25 mg by mouth 2 (two) times daily as needed for anxiety.  . calcium carbonate (TUMS - DOSED IN MG ELEMENTAL CALCIUM) 500 MG chewable tablet Chew 2 tablets by mouth 3 (three) times daily.  . carvedilol (COREG) 12.5 MG tablet TAKE ONE TABLET BY MOUTH TWICE DAILY WITH MEALS  . ferrous sulfate 325 (65 FE) MG tablet Take 325 mg by mouth daily with breakfast.  . furosemide (LASIX) 40 MG tablet Take 40 mg daily. I weight  is above 146 lbs take an extra 20 mg.  . lisinopril (ZESTRIL) 5 MG tablet Take 1 tablet by mouth once daily  . lisinopril (ZESTRIL) 5 MG tablet Take 1 tablet (5 mg total) by mouth daily.  . magnesium oxide (MAG-OX) 400 (241.3 Mg) MG tablet Take 1 tablet (400 mg total) by mouth daily.  . pantoprazole (PROTONIX) 40 MG tablet Take 40 mg by mouth daily.  . sertraline (ZOLOFT) 50 MG tablet Take 50 mg by mouth daily.  Marland Kitchen SPIRIVA HANDIHALER 18 MCG inhalation capsule INHALE ONE PUFF BY MOUTH EVERY DAY  . tiotropium (SPIRIVA HANDIHALER) 18 MCG inhalation capsule INHALE ONE DOSE IN INHALER BY MOUTH ONCE DAILY  . XARELTO 20 MG TABS tablet TAKE ONE TABLET BY MOUTH DAILY  . zolpidem (AMBIEN) 5 MG tablet Take 5 mg by mouth at bedtime. For sleep     Allergies:   Patient has no known allergies.   Social History   Tobacco Use  . Smoking status: Never Smoker  . Smokeless  tobacco: Never Used  Substance Use Topics  . Alcohol use: No    Alcohol/week: 0.0 standard drinks  . Drug use: No     Family Hx: The patient's family history includes Cancer in her father and sister; Cervical cancer in her mother; Irritable bowel syndrome in an other family member. There is no history of Anesthesia problems, Hypotension, Malignant hyperthermia, Pseudochol deficiency, Colon cancer, Celiac disease, or Inflammatory bowel disease.  ROS:   Please see the history of present illness.     All other systems reviewed and are negative.   Prior CV studies:   The following studies were reviewed today:  06/2012 TEE: mod to severe MR, milld to mod TR   06/12/12 Cath Hemodynamics:  AO 118/68 (83)  LV 119/8  Cannot calculate gradient due to rapid atrial fib  Coronary angiography:  Coronary dominance: right  Left mainstem: Short without significant disease  Left anterior descending (LAD): There is calcification proximally. There are two major diagonal branches. Between the first and second diagonal branches are  approximately 30-40% plaquing. The distal LAD has mild irregularity. Both diagonals have minimal irregularity.  Left circumflex (LCx): Provides one large bifurcation marginal Hien Perreira and a small AV circumflex. There is minor distal irregularity. The AV circ is small  Right coronary artery (RCA): Provides a large PDA and a large and smaller PLA branches. No significant focal obstruction.  Left ventriculography: Due to ectopy, EF cannot be calculated. With AF, difficult to be sure of function but appears 35%. There does appear to be some MR.  Final Conclusions:  1. Atrial fib with rapid ventricular response.  2. Mild calcification of proximal LAD without critical obstruction. No high grade coronary artery disease.  09/2012 Echo: LVEF 25%, severely dilated LV, mild LVH, mild AI, mild MR   09/26/13 Clinic EKG Afib, V-paced  11/08/13 Echo Study Conclusions  - Left ventricle: The cavity size was moderately dilated. Wall thickness was increased in a pattern of mild LVH. Systolic function was moderately to severely reduced. The estimated ejection fraction was in the range of 30% to 35%. Diffuse hypokinesis. There is severe hypokinesis of the anteroseptal myocardium. Doppler parameters are consistent with restrictive physiology, indicative of decreased left ventricular diastolic compliance and/or increased left atrial pressure. - Ventricular septum: Septal motion showed abnormal function and dyssynergy. - Aortic valve: Trileaflet; mildly thickened leaflets. Mild to moderate regurgitation. - Mitral valve: Mildly thickened leaflets . Moderate regurgitation, appears to be made up of two jets. Suboptimal PISA. - Left atrium: The atrium was severely dilated. - Right ventricle: The cavity size was mildly dilated. Pacer wire or catheter noted in right ventricle. - Right atrium: The atrium was moderately to severely dilated. - Tricuspid valve: Mild-moderate regurgitation. - Pulmonic valve:  Mild regurgitation. - Pulmonary arteries: Systolic pressure was moderately increased. - Pericardium, extracardiac: There was no pericardial effusion. Impressions:  - Mild LVH with moderate chamber dilatation and LVEF 30-35% with diffuse hypokinesis, most prominent anteroseptal wall in setting of septal dyssynergy and pacing. Restrictive diastolic filling pattern with increased filling pressures. Severe left atrial and moderate to severe right atrial enlargement. MIldly thickened mitral valve with overall moderate mitral regurgitation made up of two jets (PISA suboptimal). Mild to moderate aortic regurgitation. Device wire in right heart. MIld to moderate tricuspid regurgitation with PASP 51 mmHg.   10/2015 echo Study Conclusions  - Left ventricle: The cavity size was normal. Systolic function was mildly reduced. The estimated ejection fraction was approximately 45%. Diffuse hypokinesis. Diastolic dysfunction, indeterminate  grade and filling pressures. Mild septal thickening. - Aortic valve: Mildly to moderately calcified annulus. Trileaflet. There was mild to moderate regurgitation. - Mitral valve: There was moderate regurgitation. - Left atrium: The atrium was severely dilated. - Right ventricle: Pacer wire or catheter noted in right ventricle. - Right atrium: The atrium was mildly to moderately dilated. - Tricuspid valve: There was mild regurgitation. - Pulmonic valve: There was mild regurgitation.  Labs/Other Tests and Data Reviewed:    EKG:  No ECG reviewed.  Recent Labs: No results found for requested labs within last 8760 hours.   Recent Lipid Panel Lab Results  Component Value Date/Time   CHOL 169 04/25/2018 11:52 AM   TRIG 121 04/25/2018 11:52 AM   HDL 40 (L) 04/25/2018 11:52 AM   CHOLHDL 4.2 04/25/2018 11:52 AM   LDLCALC 107 (H) 04/25/2018 11:52 AM    Wt Readings from Last 3 Encounters:  11/29/18 140 lb (63.5 kg)  04/21/18 147 lb (66.7 kg)   03/24/18 145 lb 9.6 oz (66 kg)     Objective:    Vital Signs:  Ht 5\' 4"  (1.626 m)   BMI 24.03 kg/m    Today's Vitals   06/21/19 0835  BP: (!) 150/78  Pulse: 75  Height: 5\' 4"  (1.626 m)   Body mass index is 24.03 kg/m.  Normal affect. Normal speech pattern and tone. Comfortable, no apparent distress. No audible signs of SOB or wheezing.   ASSESSMENT & PLAN:    1. NICM/Chronic systolic HF -LVEF has normalized - no recent symptoms, some chronic SOB more related to her COPD - continue current meds  2. Afib  - hx of av nodal ablation with pacemaker  -no recent symptoms, continue current meds  3. Pacemaker - continue to f/u in device clinic   4. HTN -elevated but hast not taken meds yet, continue to monitor.     COVID-19 Education: The signs and symptoms of COVID-19 were discussed with the patient and how to seek care for testing (follow up with PCP or arrange E-visit).  The importance of social distancing was discussed today.  Time:   Today, I have spent 16 minutes with the patient with telehealth technology discussing the above problems.     Medication Adjustments/Labs and Tests Ordered: Current medicines are reviewed at length with the patient today.  Concerns regarding medicines are outlined above.   Tests Ordered: No orders of the defined types were placed in this encounter.   Medication Changes: No orders of the defined types were placed in this encounter.   Follow Up:  Virtual Visit  in 6 month(s)  Signed, Dina Rich, MD  06/21/2019 9:42 AM    [facility]

## 2019-06-26 DIAGNOSIS — Z1329 Encounter for screening for other suspected endocrine disorder: Secondary | ICD-10-CM | POA: Diagnosis not present

## 2019-06-26 DIAGNOSIS — K219 Gastro-esophageal reflux disease without esophagitis: Secondary | ICD-10-CM | POA: Diagnosis not present

## 2019-06-26 DIAGNOSIS — Z8639 Personal history of other endocrine, nutritional and metabolic disease: Secondary | ICD-10-CM | POA: Diagnosis not present

## 2019-06-26 DIAGNOSIS — F5101 Primary insomnia: Secondary | ICD-10-CM | POA: Diagnosis not present

## 2019-06-26 DIAGNOSIS — Z1159 Encounter for screening for other viral diseases: Secondary | ICD-10-CM | POA: Diagnosis not present

## 2019-06-26 DIAGNOSIS — R194 Change in bowel habit: Secondary | ICD-10-CM | POA: Diagnosis not present

## 2019-06-26 DIAGNOSIS — Z8739 Personal history of other diseases of the musculoskeletal system and connective tissue: Secondary | ICD-10-CM | POA: Diagnosis not present

## 2019-06-26 DIAGNOSIS — Z1321 Encounter for screening for nutritional disorder: Secondary | ICD-10-CM | POA: Diagnosis not present

## 2019-06-26 DIAGNOSIS — Z7689 Persons encountering health services in other specified circumstances: Secondary | ICD-10-CM | POA: Diagnosis not present

## 2019-06-26 DIAGNOSIS — I5022 Chronic systolic (congestive) heart failure: Secondary | ICD-10-CM | POA: Diagnosis not present

## 2019-06-26 DIAGNOSIS — Z950 Presence of cardiac pacemaker: Secondary | ICD-10-CM | POA: Diagnosis not present

## 2019-06-26 DIAGNOSIS — J438 Other emphysema: Secondary | ICD-10-CM | POA: Diagnosis not present

## 2019-06-26 DIAGNOSIS — I4819 Other persistent atrial fibrillation: Secondary | ICD-10-CM | POA: Diagnosis not present

## 2019-06-27 ENCOUNTER — Encounter: Payer: Medicare Other | Admitting: Internal Medicine

## 2019-06-27 DIAGNOSIS — Z23 Encounter for immunization: Secondary | ICD-10-CM | POA: Diagnosis not present

## 2019-07-03 ENCOUNTER — Encounter: Payer: Self-pay | Admitting: Internal Medicine

## 2019-07-03 ENCOUNTER — Ambulatory Visit (INDEPENDENT_AMBULATORY_CARE_PROVIDER_SITE_OTHER): Payer: Medicare Other | Admitting: Internal Medicine

## 2019-07-03 ENCOUNTER — Other Ambulatory Visit: Payer: Self-pay

## 2019-07-03 VITALS — BP 137/84 | HR 90 | Temp 96.5°F | Ht 64.0 in | Wt 148.0 lb

## 2019-07-03 DIAGNOSIS — Z950 Presence of cardiac pacemaker: Secondary | ICD-10-CM

## 2019-07-03 DIAGNOSIS — I428 Other cardiomyopathies: Secondary | ICD-10-CM | POA: Diagnosis not present

## 2019-07-03 DIAGNOSIS — I4891 Unspecified atrial fibrillation: Secondary | ICD-10-CM

## 2019-07-03 NOTE — Patient Instructions (Signed)
Medication Instructions:  Your physician recommends that you continue on your current medications as directed. Please refer to the Current Medication list given to you today.  *If you need a refill on your cardiac medications before your next appointment, please call your pharmacy*  Lab Work: NONE   If you have labs (blood work) drawn today and your tests are completely normal, you will receive your results only by: Marland Kitchen MyChart Message (if you have MyChart) OR . A paper copy in the mail If you have any lab test that is abnormal or we need to change your treatment, we will call you to review the results.  Testing/Procedures: NONE  Follow-Up: At [facility], you and your health needs are our priority.  As part of our continuing mission to provide you with exceptional heart care, we have created designated Provider Care Teams.  These Care Teams include your primary Cardiologist (physician) and Advanced Practice Providers (APPs -  Physician Assistants and Nurse Practitioners) who all work together to provide you with the care you need, when you need it.  Your next appointment:   1 month(s)  The format for your next appointment:   Virtual Visit   Provider:   Cristopher Peru, MD  Other Instructions Thank you for choosing [facility]!

## 2019-07-03 NOTE — Progress Notes (Signed)
HPI Ms. Armbrust returns today for ongoing evaluation and management of atrial fib, chronic systolic heart failure, and CHB, s/p PPM insertion. She has moved to [place] and done well. She has not been in the hospital with CHF and has had minimal sob or peripheral edema. She denies medical or dietary non-compliance.  No Known Allergies   Current Outpatient Medications  Medication Sig Dispense Refill  . albuterol (VENTOLIN HFA) 108 (90 Base) MCG/ACT inhaler Inhale 2 puffs into the lungs every 6 (six) hours as needed for wheezing or shortness of breath. 1 Inhaler 2  . ALPRAZolam (XANAX) 0.25 MG tablet Take 0.25 mg by mouth 2 (two) times daily as needed for anxiety.    . calcium carbonate (TUMS - DOSED IN MG ELEMENTAL CALCIUM) 500 MG chewable tablet Chew 2 tablets by mouth 3 (three) times daily.    . carvedilol (COREG) 12.5 MG tablet TAKE ONE TABLET BY MOUTH TWICE DAILY WITH MEALS 180 tablet 3  . ferrous sulfate 325 (65 FE) MG tablet Take 325 mg by mouth daily with breakfast.    . furosemide (LASIX) 40 MG tablet Take 40 mg daily. I weight is above 146 lbs take an extra 20 mg. 120 tablet 3  . lisinopril (ZESTRIL) 5 MG tablet Take 1 tablet by mouth once daily 90 tablet 3  . lisinopril (ZESTRIL) 5 MG tablet Take 1 tablet (5 mg total) by mouth daily. 90 tablet 3  . magnesium oxide (MAG-OX) 400 (241.3 Mg) MG tablet Take 1 tablet (400 mg total) by mouth daily. 90 tablet 0  . pantoprazole (PROTONIX) 40 MG tablet Take 40 mg by mouth daily.    . sertraline (ZOLOFT) 50 MG tablet Take 50 mg by mouth daily.    Marland Kitchen SPIRIVA HANDIHALER 18 MCG inhalation capsule INHALE ONE PUFF BY MOUTH EVERY DAY 30 capsule 0  . tiotropium (SPIRIVA HANDIHALER) 18 MCG inhalation capsule INHALE ONE DOSE IN INHALER BY MOUTH ONCE DAILY 90 capsule 0  . XARELTO 20 MG TABS tablet TAKE ONE TABLET BY MOUTH DAILY 90 tablet 1  . zolpidem (AMBIEN) 5 MG tablet Take 5 mg by mouth at bedtime. For sleep     No current  facility-administered medications for this visit.      Past Medical History:  Diagnosis Date  . Anemia    2011: Hemoglobin of 11.4; hematocrit of 33.5; normal MCV  . Anxiety   . Anxiety and depression   . Atrial fibrillation (HCC)    Echocardiogram in 2004-borderline LV function; no valvular abnormalities; 06/2012: AV nodal ablation + biventricular pacemaker  . Cardiac arrest (HCC) 06/2012   16 day hospitalization; caused by hyperkalemia; multiple complications including CHF; permanent atrial fibrillation requiring AV nodal ablation + pacing  . CHF (congestive heart failure) (HCC)   . COPD (chronic obstructive pulmonary disease) (HCC)    heterozygous alpha-1 anti-trypsin deficiency  . Depression   . Dysrhythmia   . GERD (gastroesophageal reflux disease)    protonix  . Nonischemic cardiomyopathy (HCC)    Echo 06/2012: EF 30-35%, moderate to severe MR; cardiac cath 06/2012:30-40% D1 and D2, estimated EF-35%, MR-not graded; right bundle branch block  . Presence of permanent cardiac pacemaker   . Shortness of breath dyspnea   . Thyroid disease   . Urinary frequency     ROS:   All systems reviewed and negative except as noted in the HPI.   Past Surgical History:  Procedure Laterality Date  . AV NODE ABLATION N/A 06/21/2012  Procedure: AV NODE ABLATION;  Surgeon: Evans Lance, MD;  Location: [facility] CATH LAB;  Service: Cardiovascular;  Laterality: N/A;  . AV NODE ABLATION N/A 06/22/2012   Procedure: AV NODE ABLATION;  Surgeon: Deboraha Sprang, MD;  Location: [facility] CATH LAB;  Service: Cardiovascular;  Laterality: N/A;  . BREAST BIOPSY     left, [facility]  . BREAST EXCISIONAL BIOPSY     Left x2  . CARDIOVERSION  06/19/2012   Procedure: CARDIOVERSION;  Surgeon: Thayer Headings, MD;  Location: [place];  Service: Cardiovascular;;  . CARDIOVERSION  06/21/2012   Procedure: CARDIOVERSION;  Surgeon: Darlin Coco, MD;  Location: [place];  Service: Cardiovascular;  Laterality: N/A;  .  CERVICAL CONIZATION W/BX  08/11/2011   Procedure: CONIZATION CERVIX WITH BIOPSY;  Surgeon: Florian Buff, MD;  Location: [facility] ORS;  Service: Gynecology;  Laterality: N/A;  Laser Conization of Cervix  . EPICARDIAL PACING LEAD PLACEMENT N/A 02/28/2015   Procedure: LV EPICARDIAL PACING LEAD PLACEMENT;  Surgeon: Gaye Pollack, MD;  Location: [place];  Service: Thoracic;  Laterality: N/A;  . INSERT / REPLACE / REMOVE PACEMAKER    . LEFT HEART CATHETERIZATION WITH CORONARY ANGIOGRAM N/A 06/15/2012   Procedure: LEFT HEART CATHETERIZATION WITH CORONARY ANGIOGRAM;  Surgeon: Hillary Bow, MD;  Location: [facility] CATH LAB;  Service: Cardiovascular;  Laterality: N/A;  . PERMANENT PACEMAKER INSERTION N/A 06/21/2012   Procedure: PERMANENT PACEMAKER INSERTION;  Surgeon: Evans Lance, MD;  Location: [facility] CATH LAB;  Service: Cardiovascular;  Laterality: N/A;  . TEE WITHOUT CARDIOVERSION  06/19/2012   Procedure: TRANSESOPHAGEAL ECHOCARDIOGRAM (TEE);  Surgeon: Thayer Headings, MD;  Location: [place];  Service: Cardiovascular;  Laterality: N/A;  . THORACOTOMY Left 02/28/2015   Procedure: THORACOTOMY MAJOR;  Surgeon: Gaye Pollack, MD;  Location: [facility] OR;  Service: Thoracic;  Laterality: Left;     Family History  Problem Relation Age of Onset  . Cancer Father        carcinoma of the lung  . Cervical cancer Mother   . Cancer Sister        carcinoma of the lung  . Irritable bowel syndrome Other        mother, sister  . Anesthesia problems Neg Hx   . Hypotension Neg Hx   . Malignant hyperthermia Neg Hx   . Pseudochol deficiency Neg Hx   . Colon cancer Neg Hx   . Celiac disease Neg Hx   . Inflammatory bowel disease Neg Hx      Social History   Socioeconomic History  . Marital status: Divorced    Spouse name: Not on file  . Number of children: 2  . Years of education: Not on file  . Highest education level: Not on file  Occupational History  . 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 Financial resource strain: Not on file  . Food insecurity    Worry: Not on file    Inability: Not on file  . Transportation needs    Medical: Not on file    Non-medical: Not on file  Tobacco Use  . Smoking status: Never Smoker  . Smokeless tobacco: Never Used  Substance and Sexual Activity  . Alcohol use: No    Alcohol/week: 0.0 standard drinks  . Drug use: No  . Sexual activity: Not on file  Lifestyle  . Physical activity    Days per week: Not on file    Minutes per session: Not on file  . Stress: Not on file  Relationships  . Social Musician on phone: Not on file    Gets together: Not on file    Attends religious service: Not on file    Active member of club or organization: Not on file    Attends meetings of clubs or organizations: Not on file    Relationship status: Not on file  . Intimate partner violence    Fear of current or ex partner: Not on file    Emotionally abused: Not on file    Physically abused: Not on file    Forced sexual activity: Not on file  Other Topics Concern  . Not on file  Social History Narrative   ** Merged History Encounter **       ** Data from: 06/22/12 Enc Dept: [facility]       ** Data from: 06/05/12 Enc Dept: [facility]   Lives alone with good family support from sisters.            BP 137/84   Pulse 90   Temp (!) 96.5 F (35.8 C)   Ht 5\' 4"  (1.626 m)   Wt 148 lb (67.1 kg)   SpO2 95%   BMI 25.40 kg/m   Physical Exam:  Well appearing NAD HEENT: Unremarkable Neck:  No JVD, no thyromegally Lymphatics:  No adenopathy Back:  No CVA tenderness Lungs:  Clear with no wheezes HEART:  Regular rate rhythm, no murmurs, no rubs, no clicks Abd:  soft, positive bowel sounds, no organomegally, no rebound, no guarding Ext:  2 plus pulses, no edema, no cyanosis, no clubbing Skin:  No rashes no nodules  Neuro:  CN II through XII intact, motor grossly intact  DEVICE  Normal device function.  See PaceArt for details. Approaching ERI.  Assess/Plan: 1. Chronic systolic heart failure - she appears to be maintaining Class 2 symptoms. She will continue her current meds. I encouraged a low sodium diet. 2. Atrial fib - her VR is well controlled. No change in meds. 3. PPM - her medtronic Biv ppm is working normally. She is approaching ERI. I would expect a gen change in March.   April, M.D.

## 2019-07-09 DIAGNOSIS — D508 Other iron deficiency anemias: Secondary | ICD-10-CM | POA: Diagnosis not present

## 2019-07-13 DIAGNOSIS — Z1212 Encounter for screening for malignant neoplasm of rectum: Secondary | ICD-10-CM | POA: Diagnosis not present

## 2019-07-13 DIAGNOSIS — Z1211 Encounter for screening for malignant neoplasm of colon: Secondary | ICD-10-CM | POA: Diagnosis not present

## 2019-07-30 ENCOUNTER — Ambulatory Visit (INDEPENDENT_AMBULATORY_CARE_PROVIDER_SITE_OTHER): Payer: Medicare Other | Admitting: *Deleted

## 2019-07-30 DIAGNOSIS — I429 Cardiomyopathy, unspecified: Secondary | ICD-10-CM

## 2019-07-31 LAB — CUP PACEART REMOTE DEVICE CHECK
Battery Remaining Longevity: 3 mo
Battery Voltage: 2.81 V
Brady Statistic AP VP Percent: 0 %
Brady Statistic AP VS Percent: 0 %
Brady Statistic AS VP Percent: 0 %
Brady Statistic AS VS Percent: 0 %
Brady Statistic RA Percent Paced: 0 %
Brady Statistic RV Percent Paced: 90.04 %
Date Time Interrogation Session: 20201229201245
Implantable Lead Implant Date: 20131120
Implantable Lead Implant Date: 20131120
Implantable Lead Implant Date: 20131120
Implantable Lead Location: 753858
Implantable Lead Location: 753859
Implantable Lead Location: 753860
Implantable Lead Model: 4196
Implantable Lead Model: 5076
Implantable Lead Model: 5076
Implantable Pulse Generator Implant Date: 20131120
Lead Channel Impedance Value: 304 Ohm
Lead Channel Impedance Value: 380 Ohm
Lead Channel Impedance Value: 4047 Ohm
Lead Channel Impedance Value: 4047 Ohm
Lead Channel Impedance Value: 4047 Ohm
Lead Channel Impedance Value: 418 Ohm
Lead Channel Impedance Value: 456 Ohm
Lead Channel Impedance Value: 513 Ohm
Lead Channel Impedance Value: 532 Ohm
Lead Channel Pacing Threshold Amplitude: 0.375 V
Lead Channel Pacing Threshold Amplitude: 2.125 V
Lead Channel Pacing Threshold Pulse Width: 0.4 ms
Lead Channel Pacing Threshold Pulse Width: 1 ms
Lead Channel Sensing Intrinsic Amplitude: 0.5 mV
Lead Channel Sensing Intrinsic Amplitude: 0.5 mV
Lead Channel Sensing Intrinsic Amplitude: 5.125 mV
Lead Channel Sensing Intrinsic Amplitude: 5.125 mV
Lead Channel Setting Pacing Amplitude: 2 V
Lead Channel Setting Pacing Amplitude: 3.25 V
Lead Channel Setting Pacing Pulse Width: 0.4 ms
Lead Channel Setting Pacing Pulse Width: 1 ms
Lead Channel Setting Sensing Sensitivity: 2.8 mV

## 2019-08-07 ENCOUNTER — Telehealth (INDEPENDENT_AMBULATORY_CARE_PROVIDER_SITE_OTHER): Payer: Medicare Other | Admitting: Internal Medicine

## 2019-08-07 ENCOUNTER — Encounter: Payer: Self-pay | Admitting: Internal Medicine

## 2019-08-07 VITALS — BP 116/68 | HR 75 | Ht 64.0 in | Wt 142.0 lb

## 2019-08-07 DIAGNOSIS — I5042 Chronic combined systolic (congestive) and diastolic (congestive) heart failure: Secondary | ICD-10-CM | POA: Diagnosis not present

## 2019-08-07 DIAGNOSIS — Z950 Presence of cardiac pacemaker: Secondary | ICD-10-CM | POA: Diagnosis not present

## 2019-08-07 DIAGNOSIS — Z7901 Long term (current) use of anticoagulants: Secondary | ICD-10-CM | POA: Diagnosis not present

## 2019-08-07 DIAGNOSIS — I4821 Permanent atrial fibrillation: Secondary | ICD-10-CM

## 2019-08-07 DIAGNOSIS — I4891 Unspecified atrial fibrillation: Secondary | ICD-10-CM

## 2019-08-07 DIAGNOSIS — I5022 Chronic systolic (congestive) heart failure: Secondary | ICD-10-CM

## 2019-08-07 NOTE — Progress Notes (Signed)
Electrophysiology TeleHealth Note   Due to national recommendations of social distancing due to COVID 19, an audio/video telehealth visit is felt to be most appropriate for this patient at this time.  See MyChart message from today for the patient's consent to telehealth for [facility].   Date:  08/07/2019   ID:  [name], [name] April 09, 1956, MRN [id number]  Location: patient's home  Provider location: [address], [place]  Evaluation Performed: Follow-up visit  PCP:  System, Provider Not In  Cardiologist:  Carlyle Dolly, MD  Electrophysiologist:  Dr Lovena Le  Chief Complaint:  "I've been more short of breath."  History of Present Illness:    [name] [name] is a 64 y.o. female who presents via audio/video conferencing for a telehealth visit today.  Since last being seen in our clinic, the patient reports that her breathing is a little worse. She gets sob with walking. She has not taken her lasix today as she went to the [place]. She admits to some dietary indiscretion.  Today, she denies symptoms of palpitations, chest pain,  lower extremity edema, dizziness, presyncope, or syncope.  The patient is otherwise without complaint today except for the dyspnea.  The patient denies symptoms of fevers, chills, cough, or new SOB worrisome for COVID 19.  Past Medical History:  Diagnosis Date  . Anemia    2011: Hemoglobin of 11.4; hematocrit of 33.5; normal MCV  . Anxiety   . Anxiety and depression   . Atrial fibrillation (HCC)    Echocardiogram in 2004-borderline LV function; no valvular abnormalities; 06/2012: AV nodal ablation + biventricular pacemaker  . Cardiac arrest ([place]) 06/2012   16 day hospitalization; caused by hyperkalemia; multiple complications including CHF; permanent atrial fibrillation requiring AV nodal ablation + pacing  . CHF (congestive heart failure) ([place])   . COPD (chronic obstructive pulmonary disease) (HCC)    heterozygous alpha-1  anti-trypsin deficiency  . Depression   . Dysrhythmia   . GERD (gastroesophageal reflux disease)    protonix  . Nonischemic cardiomyopathy ([place])    Echo 06/2012: EF 30-35%, moderate to severe MR; cardiac cath 06/2012:30-40% D1 and D2, estimated EF-35%, MR-not graded; right bundle branch block  . Presence of permanent cardiac pacemaker   . Shortness of breath dyspnea   . Thyroid disease   . Urinary frequency     Past Surgical History:  Procedure Laterality Date  . AV NODE ABLATION N/A 06/21/2012   Procedure: AV NODE ABLATION;  Surgeon: Evans Lance, MD;  Location: [facility] CATH LAB;  Service: Cardiovascular;  Laterality: N/A;  . AV NODE ABLATION N/A 06/22/2012   Procedure: AV NODE ABLATION;  Surgeon: Deboraha Sprang, MD;  Location: [facility] CATH LAB;  Service: Cardiovascular;  Laterality: N/A;  . BREAST BIOPSY     left, [facility]  . BREAST EXCISIONAL BIOPSY     Left x2  . CARDIOVERSION  06/19/2012   Procedure: CARDIOVERSION;  Surgeon: Thayer Headings, MD;  Location: [place];  Service: Cardiovascular;;  . CARDIOVERSION  06/21/2012   Procedure: CARDIOVERSION;  Surgeon: Darlin Coco, MD;  Location: [place];  Service: Cardiovascular;  Laterality: N/A;  . CERVICAL CONIZATION W/BX  08/11/2011   Procedure: CONIZATION CERVIX WITH BIOPSY;  Surgeon: Florian Buff, MD;  Location: [facility]S;  Service: Gynecology;  Laterality: N/A;  Laser Conization of Cervix  . EPICARDIAL PACING LEAD PLACEMENT N/A 02/28/2015   Procedure: LV EPICARDIAL PACING LEAD PLACEMENT;  Surgeon: Gaye Pollack, MD;  [facility] OR;  Service: Thoracic;  Laterality: N/A;  . INSERT / REPLACE / REMOVE PACEMAKER    . LEFT HEART CATHETERIZATION WITH CORONARY ANGIOGRAM N/A 06/15/2012   Procedure: LEFT HEART CATHETERIZATION WITH CORONARY ANGIOGRAM;  Surgeon: Herby Abraham, MD;  Location: [facility] CATH LAB;  Service: Cardiovascular;  Laterality: N/A;  . PERMANENT PACEMAKER INSERTION N/A 06/21/2012   Procedure: PERMANENT PACEMAKER INSERTION;   Surgeon: Marinus Maw, MD;  Location: [facility] CATH LAB;  Service: Cardiovascular;  Laterality: N/A;  . TEE WITHOUT CARDIOVERSION  06/19/2012   Procedure: TRANSESOPHAGEAL ECHOCARDIOGRAM (TEE);  Surgeon: Vesta Mixer, MD;  Location: [facility] ENDOSCOPY;  Service: Cardiovascular;  Laterality: N/A;  . THORACOTOMY Left 02/28/2015   Procedure: THORACOTOMY MAJOR;  Surgeon: Alleen Borne, MD;  Location: [facility] OR;  Service: Thoracic;  Laterality: Left;    Current Outpatient Medications  Medication Sig Dispense Refill  . albuterol (VENTOLIN HFA) 108 (90 Base) MCG/ACT inhaler Inhale 2 puffs into the lungs every 6 (six) hours as needed for wheezing or shortness of breath. 1 Inhaler 2  . ALPRAZolam (XANAX) 0.25 MG tablet Take 0.25 mg by mouth 2 (two) times daily as needed for anxiety.    . calcium carbonate (TUMS - DOSED IN MG ELEMENTAL CALCIUM) 500 MG chewable tablet Chew 2 tablets by mouth 3 (three) times daily.    . carvedilol (COREG) 12.5 MG tablet TAKE ONE TABLET BY MOUTH TWICE DAILY WITH MEALS 180 tablet 3  . ferrous sulfate 325 (65 FE) MG tablet Take 325 mg by mouth daily with breakfast.    . furosemide (LASIX) 40 MG tablet Take 40 mg daily. I weight is above 146 lbs take an extra 20 mg. 120 tablet 3  . lisinopril (ZESTRIL) 5 MG tablet Take 1 tablet (5 mg total) by mouth daily. 90 tablet 3  . magnesium oxide (MAG-OX) 400 (241.3 Mg) MG tablet Take 1 tablet (400 mg total) by mouth daily. 90 tablet 0  . pantoprazole (PROTONIX) 40 MG tablet Take 40 mg by mouth daily.    . sertraline (ZOLOFT) 50 MG tablet Take 50 mg by mouth daily.    Marland Kitchen SPIRIVA HANDIHALER 18 MCG inhalation capsule INHALE ONE PUFF BY MOUTH EVERY DAY 30 capsule 0  . tiotropium (SPIRIVA HANDIHALER) 18 MCG inhalation capsule INHALE ONE DOSE IN INHALER BY MOUTH ONCE DAILY 90 capsule 0  . XARELTO 20 MG TABS tablet TAKE ONE TABLET BY MOUTH DAILY 90 tablet 1  . zolpidem (AMBIEN) 5 MG tablet Take 5 mg by mouth at bedtime. For sleep     No current  facility-administered medications for this visit.    Allergies:   Patient has no known allergies.   Social History:  The patient  reports that she has never smoked. She has never used smokeless tobacco. She reports that she does not drink alcohol or use drugs.   Family History:  The patient's family history includes Cancer in her father and sister; Cervical cancer in her mother; Irritable bowel syndrome in an other family member.   ROS:  Please see the history of present illness.   All other systems are personally reviewed and negative.    Exam:    Vital Signs:  BP 116/68   Pulse 75   Ht 5\' 4"  (1.626 m)   Wt 142 lb (64.4 kg)   BMI 24.37 kg/m     Labs/Other Tests and Data Reviewed:    Recent Labs: No results found for requested labs within last 8760 hours.  Wt Readings from Last 3 Encounters:  08/07/19 142 lb (64.4 kg)  07/03/19 148 lb (67.1 kg)  11/29/18 140 lb (63.5 kg)     Other studies personally reviewed: Additional studies/ records that were reviewed today include: PM interrogation.  Last device remote is reviewed from PaceART PDF dated 12/20 which reveals normal device function, no arrhythmias    ASSESSMENT & PLAN:    1.  Chronic systolic/diastolic heart failure - she has class 2B symptoms which are multifactorial. I asked her to go home and to take her lasix. She should take lasix twice a day if she is short of breath in the afternoon or evening. 2. PPM - she is approaching ERI. I will see her back in 3 months. 3. Atrial fib - her VR is well controlled in the setting of CHB.   Aayat Hajjar,M.D.  Follow-up:  3 months Next remote: 3 months  Current medicines are reviewed at length with the patient today.   The patient does not have concerns regarding her medicines.  The following changes were made today:  none  Labs/ tests ordered today include: none No orders of the defined types were placed in this encounter.    Patient Risk:  after full review of  this patients clinical status, I feel that they are at moderate risk at this time.  Today, I have spent 15 minutes with the patient with telehealth technology discussing all of the above .    Signed, Lewayne Bunting, MD  08/07/2019 1:11 PM     [facility] [address] [place] [address] [phone] (office) [phone] (fax)

## 2019-08-07 NOTE — Patient Instructions (Signed)
Medication Instructions:  Your physician recommends that you continue on your current medications as directed. Please refer to the Current Medication list given to you today.  *If you need a refill on your cardiac medications before your next appointment, please call your pharmacy*  Lab Work: none If you have labs (blood work) drawn today and your tests are completely normal, you will receive your results only by: Marland Kitchen MyChart Message (if you have MyChart) OR . A paper copy in the mail If you have any lab test that is abnormal or we need to change your treatment, we will call you to review the results.  Testing/Procedures: none  Follow-Up: At [facility], you and your health needs are our priority.  As part of our continuing mission to provide you with exceptional heart care, we have created designated Provider Care Teams.  These Care Teams include your primary Cardiologist (physician) and Advanced Practice Providers (APPs -  Physician Assistants and Nurse Practitioners) who all work together to provide you with the care you need, when you need it.  Your next appointment:   3 month(s)  The format for your next appointment:   In Person  Provider:   Lewayne Bunting, MD  Other Instructions None

## 2019-08-30 ENCOUNTER — Ambulatory Visit (INDEPENDENT_AMBULATORY_CARE_PROVIDER_SITE_OTHER): Payer: Medicare Other | Admitting: *Deleted

## 2019-08-30 ENCOUNTER — Other Ambulatory Visit: Payer: Self-pay

## 2019-08-30 DIAGNOSIS — I429 Cardiomyopathy, unspecified: Secondary | ICD-10-CM

## 2019-08-30 LAB — CUP PACEART REMOTE DEVICE CHECK
Battery Remaining Longevity: 3 mo
Battery Voltage: 2.8 V
Brady Statistic AP VP Percent: 0 %
Brady Statistic AP VS Percent: 0 %
Brady Statistic AS VP Percent: 0 %
Brady Statistic AS VS Percent: 0 %
Brady Statistic RA Percent Paced: 0 %
Brady Statistic RV Percent Paced: 88.3 %
Date Time Interrogation Session: 20210128201503
Implantable Lead Implant Date: 20131120
Implantable Lead Implant Date: 20131120
Implantable Lead Implant Date: 20131120
Implantable Lead Location: 753858
Implantable Lead Location: 753859
Implantable Lead Location: 753860
Implantable Lead Model: 4196
Implantable Lead Model: 5076
Implantable Lead Model: 5076
Implantable Pulse Generator Implant Date: 20131120
Lead Channel Impedance Value: 323 Ohm
Lead Channel Impedance Value: 380 Ohm
Lead Channel Impedance Value: 4047 Ohm
Lead Channel Impedance Value: 4047 Ohm
Lead Channel Impedance Value: 4047 Ohm
Lead Channel Impedance Value: 418 Ohm
Lead Channel Impedance Value: 456 Ohm
Lead Channel Impedance Value: 513 Ohm
Lead Channel Impedance Value: 532 Ohm
Lead Channel Pacing Threshold Amplitude: 0.5 V
Lead Channel Pacing Threshold Amplitude: 2 V
Lead Channel Pacing Threshold Pulse Width: 0.4 ms
Lead Channel Pacing Threshold Pulse Width: 1 ms
Lead Channel Sensing Intrinsic Amplitude: 0.5 mV
Lead Channel Sensing Intrinsic Amplitude: 0.5 mV
Lead Channel Sensing Intrinsic Amplitude: 4.125 mV
Lead Channel Sensing Intrinsic Amplitude: 4.125 mV
Lead Channel Setting Pacing Amplitude: 2 V
Lead Channel Setting Pacing Amplitude: 3 V
Lead Channel Setting Pacing Pulse Width: 0.4 ms
Lead Channel Setting Pacing Pulse Width: 1 ms
Lead Channel Setting Sensing Sensitivity: 2.8 mV

## 2019-08-30 NOTE — Progress Notes (Signed)
PPM Remote  

## 2019-09-13 ENCOUNTER — Other Ambulatory Visit: Payer: Self-pay | Admitting: Cardiology

## 2019-10-01 ENCOUNTER — Ambulatory Visit (INDEPENDENT_AMBULATORY_CARE_PROVIDER_SITE_OTHER): Payer: Medicare Other | Admitting: *Deleted

## 2019-10-01 DIAGNOSIS — I429 Cardiomyopathy, unspecified: Secondary | ICD-10-CM

## 2019-10-01 LAB — CUP PACEART REMOTE DEVICE CHECK
Battery Remaining Longevity: 3 mo
Battery Voltage: 2.79 V
Brady Statistic AP VP Percent: 0 %
Brady Statistic AP VS Percent: 0 %
Brady Statistic AS VP Percent: 0 %
Brady Statistic AS VS Percent: 0 %
Brady Statistic RA Percent Paced: 0 %
Brady Statistic RV Percent Paced: 90.99 %
Date Time Interrogation Session: 20210302003107
Implantable Lead Implant Date: 20131120
Implantable Lead Implant Date: 20131120
Implantable Lead Implant Date: 20131120
Implantable Lead Location: 753858
Implantable Lead Location: 753859
Implantable Lead Location: 753860
Implantable Lead Model: 4196
Implantable Lead Model: 5076
Implantable Lead Model: 5076
Implantable Pulse Generator Implant Date: 20131120
Lead Channel Impedance Value: 304 Ohm
Lead Channel Impedance Value: 380 Ohm
Lead Channel Impedance Value: 399 Ohm
Lead Channel Impedance Value: 4047 Ohm
Lead Channel Impedance Value: 4047 Ohm
Lead Channel Impedance Value: 4047 Ohm
Lead Channel Impedance Value: 437 Ohm
Lead Channel Impedance Value: 494 Ohm
Lead Channel Impedance Value: 513 Ohm
Lead Channel Pacing Threshold Amplitude: 0.5 V
Lead Channel Pacing Threshold Amplitude: 2 V
Lead Channel Pacing Threshold Pulse Width: 0.4 ms
Lead Channel Pacing Threshold Pulse Width: 1 ms
Lead Channel Sensing Intrinsic Amplitude: 0.5 mV
Lead Channel Sensing Intrinsic Amplitude: 0.5 mV
Lead Channel Sensing Intrinsic Amplitude: 3.625 mV
Lead Channel Sensing Intrinsic Amplitude: 3.625 mV
Lead Channel Setting Pacing Amplitude: 2 V
Lead Channel Setting Pacing Amplitude: 3 V
Lead Channel Setting Pacing Pulse Width: 0.4 ms
Lead Channel Setting Pacing Pulse Width: 1 ms
Lead Channel Setting Sensing Sensitivity: 2.8 mV

## 2019-10-01 NOTE — Progress Notes (Signed)
PPM Remote  

## 2019-11-01 ENCOUNTER — Ambulatory Visit (INDEPENDENT_AMBULATORY_CARE_PROVIDER_SITE_OTHER): Payer: Medicare Other | Admitting: *Deleted

## 2019-11-01 DIAGNOSIS — I429 Cardiomyopathy, unspecified: Secondary | ICD-10-CM | POA: Diagnosis not present

## 2019-11-01 LAB — CUP PACEART REMOTE DEVICE CHECK
Battery Remaining Longevity: 2 mo
Battery Voltage: 2.77 V
Brady Statistic AP VP Percent: 0 %
Brady Statistic AP VS Percent: 0 %
Brady Statistic AS VP Percent: 0 %
Brady Statistic AS VS Percent: 0 %
Brady Statistic RA Percent Paced: 0 %
Brady Statistic RV Percent Paced: 92.12 %
Date Time Interrogation Session: 20210401232105
Implantable Lead Implant Date: 20131120
Implantable Lead Implant Date: 20131120
Implantable Lead Implant Date: 20131120
Implantable Lead Location: 753858
Implantable Lead Location: 753859
Implantable Lead Location: 753860
Implantable Lead Model: 4196
Implantable Lead Model: 5076
Implantable Lead Model: 5076
Implantable Pulse Generator Implant Date: 20131120
Lead Channel Impedance Value: 323 Ohm
Lead Channel Impedance Value: 380 Ohm
Lead Channel Impedance Value: 399 Ohm
Lead Channel Impedance Value: 4047 Ohm
Lead Channel Impedance Value: 4047 Ohm
Lead Channel Impedance Value: 4047 Ohm
Lead Channel Impedance Value: 456 Ohm
Lead Channel Impedance Value: 494 Ohm
Lead Channel Impedance Value: 513 Ohm
Lead Channel Pacing Threshold Amplitude: 0.5 V
Lead Channel Pacing Threshold Amplitude: 2 V
Lead Channel Pacing Threshold Pulse Width: 0.4 ms
Lead Channel Pacing Threshold Pulse Width: 1 ms
Lead Channel Sensing Intrinsic Amplitude: 0.5 mV
Lead Channel Sensing Intrinsic Amplitude: 0.5 mV
Lead Channel Sensing Intrinsic Amplitude: 4 mV
Lead Channel Sensing Intrinsic Amplitude: 4 mV
Lead Channel Setting Pacing Amplitude: 2 V
Lead Channel Setting Pacing Amplitude: 3 V
Lead Channel Setting Pacing Pulse Width: 0.4 ms
Lead Channel Setting Pacing Pulse Width: 1 ms
Lead Channel Setting Sensing Sensitivity: 2.8 mV

## 2019-11-01 NOTE — Progress Notes (Signed)
PPM Remote  

## 2019-11-14 ENCOUNTER — Encounter: Payer: Self-pay | Admitting: Internal Medicine

## 2019-11-14 ENCOUNTER — Ambulatory Visit (INDEPENDENT_AMBULATORY_CARE_PROVIDER_SITE_OTHER): Payer: Medicare Other | Admitting: Internal Medicine

## 2019-11-14 ENCOUNTER — Encounter: Payer: Self-pay | Admitting: *Deleted

## 2019-11-14 ENCOUNTER — Other Ambulatory Visit: Payer: Self-pay | Admitting: Cardiology

## 2019-11-14 ENCOUNTER — Other Ambulatory Visit: Payer: Self-pay

## 2019-11-14 VITALS — BP 122/64 | HR 98 | Temp 97.5°F | Ht 64.0 in | Wt 136.4 lb

## 2019-11-14 DIAGNOSIS — I428 Other cardiomyopathies: Secondary | ICD-10-CM

## 2019-11-14 DIAGNOSIS — I4891 Unspecified atrial fibrillation: Secondary | ICD-10-CM | POA: Diagnosis not present

## 2019-11-14 DIAGNOSIS — I5022 Chronic systolic (congestive) heart failure: Secondary | ICD-10-CM

## 2019-11-14 DIAGNOSIS — Z950 Presence of cardiac pacemaker: Secondary | ICD-10-CM

## 2019-11-14 NOTE — Addendum Note (Signed)
Addended by: Kerney Elbe on: 11/14/2019 12:46 PM   Modules accepted: Orders

## 2019-11-14 NOTE — Patient Instructions (Signed)
Medication Instructions:  Your physician recommends that you continue on your current medications as directed. Please refer to the Current Medication list given to you today.  *If you need a refill on your cardiac medications before your next appointment, please call your pharmacy*   Lab Work: Your physician recommends that you return for lab work in: June 25   If you have labs (blood work) drawn today and your tests are completely normal, you will receive your results only by: Marland Kitchen MyChart Message (if you have MyChart) OR . A paper copy in the mail If you have any lab test that is abnormal or we need to change your treatment, we will call you to review the results.   Testing/Procedures: Your physician has recommended that you have a pacemaker inserted. A pacemaker is a small device that is placed under the skin of your chest or abdomen to help control abnormal heart rhythms. This device uses electrical pulses to prompt the heart to beat at a normal rate. Pacemakers are used to treat heart rhythms that are too slow. Wire (leads) are attached to the pacemaker that goes into the chambers of you heart. This is done in the hospital and usually requires and overnight stay. Please see the instruction sheet given to you today for more information.     Follow-Up: At [facility], you and your health needs are our priority.  As part of our continuing mission to provide you with exceptional heart care, we have created designated Provider Care Teams.  These Care Teams include your primary Cardiologist (physician) and Advanced Practice Providers (APPs -  Physician Assistants and Nurse Practitioners) who all work together to provide you with the care you need, when you need it.  We recommend signing up for the patient portal called "MyChart".  Sign up information is provided on this After Visit Summary.  MyChart is used to connect with patients for Virtual Visits (Telemedicine).  Patients are able to view  lab/test results, encounter notes, upcoming appointments, etc.  Non-urgent messages can be sent to your provider as well.   To learn more about what you can do with MyChart, go to [email or web address].    Your next appointment:    As Planned   The format for your next appointment:   In Person  Provider:   Lewayne Bunting, MD   Other Instructions Thank you for choosing [facility]!

## 2019-11-14 NOTE — Progress Notes (Signed)
HPI [name] [name] returns today for followup. She is a pleasant 64 yo woman with chronic systolic heart failure, chronic atrial fib s/p AV node ablation with CHB, s/p biv PPM insertion who is approaching ERI. She has moved to [place]. She feels well. She denies chest pain or sob. No syncope. No edema.  No Known Allergies   Current Outpatient Medications  Medication Sig Dispense Refill  . albuterol (VENTOLIN HFA) 108 (90 Base) MCG/ACT inhaler Inhale 2 puffs into the lungs every 6 (six) hours as needed for wheezing or shortness of breath. 1 Inhaler 2  . ALPRAZolam (XANAX) 0.25 MG tablet Take 0.25 mg by mouth 2 (two) times daily as needed for anxiety.    . calcium carbonate (TUMS - DOSED IN MG ELEMENTAL CALCIUM) 500 MG chewable tablet Chew 2 tablets by mouth 3 (three) times daily.    . carvedilol (COREG) 12.5 MG tablet TAKE ONE TABLET BY MOUTH TWICE DAILY WITH MEALS 180 tablet 3  . ferrous sulfate 325 (65 FE) MG tablet Take 325 mg by mouth daily with breakfast.    . furosemide (LASIX) 40 MG tablet Take 40 mg daily. I weight is above 146 lbs take an extra 20 mg. 120 tablet 3  . lisinopril (ZESTRIL) 5 MG tablet Take 1 tablet (5 mg total) by mouth daily. 90 tablet 3  . magnesium oxide (MAG-OX) 400 (241.3 Mg) MG tablet Take 1 tablet (400 mg total) by mouth daily. 90 tablet 0  . pantoprazole (PROTONIX) 40 MG tablet Take 40 mg by mouth daily.    . sertraline (ZOLOFT) 50 MG tablet Take 50 mg by mouth daily.    Marland Kitchen SPIRIVA HANDIHALER 18 MCG inhalation capsule INHALE ONE PUFF BY MOUTH EVERY DAY 30 capsule 1  . tiotropium (SPIRIVA HANDIHALER) 18 MCG inhalation capsule INHALE ONE DOSE IN INHALER BY MOUTH ONCE DAILY 90 capsule 0  . XARELTO 20 MG TABS tablet TAKE ONE TABLET BY MOUTH DAILY 90 tablet 1  . zolpidem (AMBIEN) 5 MG tablet Take 5 mg by mouth at bedtime. For sleep     No current facility-administered medications for this visit.     Past Medical History:  Diagnosis Date  . Anemia    2011:  Hemoglobin of 11.4; hematocrit of 33.5; normal MCV  . Anxiety   . Anxiety and depression   . Atrial fibrillation (HCC)    Echocardiogram in 2004-borderline LV function; no valvular abnormalities; 06/2012: AV nodal ablation + biventricular pacemaker  . Cardiac arrest ([place]) 06/2012   16 day hospitalization; caused by hyperkalemia; multiple complications including CHF; permanent atrial fibrillation requiring AV nodal ablation + pacing  . CHF (congestive heart failure) ([place])   . COPD (chronic obstructive pulmonary disease) (HCC)    heterozygous alpha-1 anti-trypsin deficiency  . Depression   . Dysrhythmia   . GERD (gastroesophageal reflux disease)    protonix  . Nonischemic cardiomyopathy ([place])    Echo 06/2012: EF 30-35%, moderate to severe MR; cardiac cath 06/2012:30-40% D1 and D2, estimated EF-35%, MR-not graded; right bundle branch block  . Presence of permanent cardiac pacemaker   . Shortness of breath dyspnea   . Thyroid disease   . Urinary frequency     ROS:   All systems reviewed and negative except as noted in the HPI.   Past Surgical History:  Procedure Laterality Date  . AV NODE ABLATION N/A 06/21/2012   Procedure: AV NODE ABLATION;  Surgeon: Evans Lance, MD;  Location: [facility] CATH LAB;  Service: Cardiovascular;  Laterality: N/A;  . AV NODE ABLATION N/A 06/22/2012   Procedure: AV NODE ABLATION;  Surgeon: Duke Salvia, MD;  Location: [facility] CATH LAB;  Service: Cardiovascular;  Laterality: N/A;  . BREAST BIOPSY     left, [facility]  . BREAST EXCISIONAL BIOPSY     Left x2  . CARDIOVERSION  06/19/2012   Procedure: CARDIOVERSION;  Surgeon: Vesta Mixer, MD;  Location: [facility] ENDOSCOPY;  Service: Cardiovascular;;  . CARDIOVERSION  06/21/2012   Procedure: CARDIOVERSION;  Surgeon: Cassell Clement, MD;  Location: [facility] OR;  Service: Cardiovascular;  Laterality: N/A;  . CERVICAL CONIZATION W/BX  08/11/2011   Procedure: CONIZATION CERVIX WITH BIOPSY;  Surgeon: Lazaro Arms, MD;  Location: [facility]  ORS;  Service: Gynecology;  Laterality: N/A;  Laser Conization of Cervix  . EPICARDIAL PACING LEAD PLACEMENT N/A 02/28/2015   Procedure: LV EPICARDIAL PACING LEAD PLACEMENT;  Surgeon: Alleen Borne, MD;  Location: [facility] OR;  Service: Thoracic;  Laterality: N/A;  . INSERT / REPLACE / REMOVE PACEMAKER    . LEFT HEART CATHETERIZATION WITH CORONARY ANGIOGRAM N/A 06/15/2012   Procedure: LEFT HEART CATHETERIZATION WITH CORONARY ANGIOGRAM;  Surgeon: Herby Abraham, MD;  Location: [facility] CATH LAB;  Service: Cardiovascular;  Laterality: N/A;  . PERMANENT PACEMAKER INSERTION N/A 06/21/2012   Procedure: PERMANENT PACEMAKER INSERTION;  Surgeon: Marinus Maw, MD;  Location: [facility] CATH LAB;  Service: Cardiovascular;  Laterality: N/A;  . TEE WITHOUT CARDIOVERSION  06/19/2012   Procedure: TRANSESOPHAGEAL ECHOCARDIOGRAM (TEE);  Surgeon: Vesta Mixer, MD;  Location: [facility] ENDOSCOPY;  Service: Cardiovascular;  Laterality: N/A;  . THORACOTOMY Left 02/28/2015   Procedure: THORACOTOMY MAJOR;  Surgeon: Alleen Borne, MD;  Location: [facility] OR;  Service: Thoracic;  Laterality: Left;     Family History  Problem Relation Age of Onset  . Cancer Father        carcinoma of the lung  . Cervical cancer Mother   . Cancer Sister        carcinoma of the lung  . Irritable bowel syndrome Other        mother, sister  . Anesthesia problems Neg Hx   . Hypotension Neg Hx   . Malignant hyperthermia Neg Hx   . Pseudochol deficiency Neg Hx   . Colon cancer Neg Hx   . Celiac disease Neg Hx   . Inflammatory bowel disease Neg Hx      Social History   Socioeconomic History  . Marital status: Divorced    Spouse name: Not on file  . Number of children: 2  . Years of education: Not on file  . Highest education level: Not on file  Occupational History  . 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 Smoking status: Never  Smoker  . Smokeless tobacco: Never Used  Substance and Sexual Activity  . Alcohol use: No    Alcohol/week: 0.0 standard drinks  . Drug use: No  . Sexual activity: Not on file  Other Topics Concern  . Not on file  Social History Narrative   ** Merged History Encounter **       ** Data from: 06/22/12 Enc Dept: [facility]       ** Data from: 06/05/12 Enc Dept: [facility]ICC[facility] N[facility]SING   Lives alone with good family support from sisters.  Social Determinants of Health   Financial Resource Strain:   . Difficulty of Paying Living Expenses:   Food Insecurity:   . Worried About Programme researcher, broadcasting/film/video in the Last Year:   . Barista in the Last Year:   Transportation Needs:   . Freight forwarder (Medical):   Marland Kitchen Lack of Transportation (Non-Medical):   Physical Activity:   . Days of Exercise per Week:   . Minutes of Exercise per Session:   Stress:   . Feeling of Stress :   Social Connections:   . Frequency of Communication with Friends and Family:   . Frequency of Social Gatherings with Friends and Family:   . Attends Religious Services:   . Active Member of Clubs or Organizations:   . Attends Banker Meetings:   Marland Kitchen Marital Status:   Intimate Partner Violence:   . Fear of Current or Ex-Partner:   . Emotionally Abused:   Marland Kitchen Physically Abused:   . Sexually Abused:      BP 122/64   Pulse 98   Temp (!) 97.5 F (36.4 C)   Ht 5\' 4"  (1.626 m)   Wt 136 lb 6.4 oz (61.9 kg)   SpO2 97%   BMI 23.41 kg/m   Physical Exam:  Well appearing NAD HEENT: Unremarkable Neck:  No JVD, no thyromegally Lymphatics:  No adenopathy Back:  No CVA tenderness Lungs:  Clear with no wheezes HEART:  Regular rate rhythm, no murmurs, no rubs, no clicks Abd:  soft, positive bowel sounds, no organomegally, no rebound, no guarding Ext:  2 plus pulses, no edema, no cyanosis, no clubbing Skin:  No rashes no nodules Neuro:  CN II through XII intact, motor grossly  intact  DEVICE  Normal device function.  See PaceArt for details. 2 mo from ERI  Assess/Plan: 1. CHB - she is asymptomatic, s/p PPM insertion.  2. Chronic systolic heart failure - she is s/p biv PPM insertion and is doing very well. Her symptoms are class 2A.  3. Atrial fib - her rates are well controlled after AV node ablation. 4. PPM - her medtronic Biv PPM is working normally. She has an epicardial LV lead.  .D.

## 2019-11-22 ENCOUNTER — Encounter: Payer: Self-pay | Admitting: *Deleted

## 2019-11-22 ENCOUNTER — Other Ambulatory Visit: Payer: Self-pay | Admitting: Cardiology

## 2019-12-03 ENCOUNTER — Ambulatory Visit (INDEPENDENT_AMBULATORY_CARE_PROVIDER_SITE_OTHER): Payer: Medicare Other | Admitting: *Deleted

## 2019-12-03 DIAGNOSIS — I469 Cardiac arrest, cause unspecified: Secondary | ICD-10-CM

## 2019-12-03 LAB — CUP PACEART REMOTE DEVICE CHECK
Battery Remaining Longevity: 2 mo
Battery Voltage: 2.76 V
Brady Statistic AP VP Percent: 0 %
Brady Statistic AP VS Percent: 0 %
Brady Statistic AS VP Percent: 0 %
Brady Statistic AS VS Percent: 0 %
Brady Statistic RA Percent Paced: 0 %
Brady Statistic RV Percent Paced: 88.22 %
Date Time Interrogation Session: 20210503212338
Implantable Lead Implant Date: 20131120
Implantable Lead Implant Date: 20131120
Implantable Lead Implant Date: 20131120
Implantable Lead Location: 753858
Implantable Lead Location: 753859
Implantable Lead Location: 753860
Implantable Lead Model: 4196
Implantable Lead Model: 5076
Implantable Lead Model: 5076
Implantable Pulse Generator Implant Date: 20131120
Lead Channel Impedance Value: 285 Ohm
Lead Channel Impedance Value: 380 Ohm
Lead Channel Impedance Value: 380 Ohm
Lead Channel Impedance Value: 4047 Ohm
Lead Channel Impedance Value: 4047 Ohm
Lead Channel Impedance Value: 4047 Ohm
Lead Channel Impedance Value: 418 Ohm
Lead Channel Impedance Value: 475 Ohm
Lead Channel Impedance Value: 513 Ohm
Lead Channel Pacing Threshold Amplitude: 0.5 V
Lead Channel Pacing Threshold Amplitude: 2 V
Lead Channel Pacing Threshold Pulse Width: 0.4 ms
Lead Channel Pacing Threshold Pulse Width: 1 ms
Lead Channel Sensing Intrinsic Amplitude: 0.5 mV
Lead Channel Sensing Intrinsic Amplitude: 0.5 mV
Lead Channel Sensing Intrinsic Amplitude: 3.875 mV
Lead Channel Sensing Intrinsic Amplitude: 3.875 mV
Lead Channel Setting Pacing Amplitude: 2 V
Lead Channel Setting Pacing Amplitude: 3 V
Lead Channel Setting Pacing Pulse Width: 0.4 ms
Lead Channel Setting Pacing Pulse Width: 1 ms
Lead Channel Setting Sensing Sensitivity: 2.8 mV

## 2019-12-04 NOTE — Progress Notes (Signed)
Remote pacemaker transmission.   

## 2019-12-05 ENCOUNTER — Other Ambulatory Visit: Payer: Self-pay | Admitting: Cardiology

## 2019-12-06 ENCOUNTER — Telehealth: Payer: Self-pay | Admitting: *Deleted

## 2019-12-06 LAB — CUP PACEART INCLINIC DEVICE CHECK
Brady Statistic RV Percent Paced: 90.5 %
Date Time Interrogation Session: 20210414163030
Implantable Lead Implant Date: 20131120
Implantable Lead Implant Date: 20131120
Implantable Lead Implant Date: 20131120
Implantable Lead Location: 753858
Implantable Lead Location: 753859
Implantable Lead Location: 753860
Implantable Lead Model: 4196
Implantable Lead Model: 5076
Implantable Lead Model: 5076
Implantable Pulse Generator Implant Date: 20131120
Lead Channel Impedance Value: 323 Ohm
Lead Channel Impedance Value: 494 Ohm
Lead Channel Pacing Threshold Amplitude: 0.5 V
Lead Channel Pacing Threshold Amplitude: 2 V
Lead Channel Pacing Threshold Pulse Width: 0.4 ms
Lead Channel Pacing Threshold Pulse Width: 1 ms
Lead Channel Sensing Intrinsic Amplitude: 3.8 mV
Lead Channel Setting Pacing Amplitude: 2 V
Lead Channel Setting Pacing Amplitude: 3 V
Lead Channel Setting Pacing Pulse Width: 0.4 ms
Lead Channel Setting Pacing Pulse Width: 1 ms
Lead Channel Setting Sensing Sensitivity: 2.8 mV

## 2019-12-06 NOTE — Telephone Encounter (Signed)
LMOVM requesting call back to DC. Direct number and office hours provided.  PPM reached ERI on 11/15/19. Generator replacement is currently scheduled for 01/28/20. Will make patient aware and offer to move up generator replacement as patient is intermittently dependent.

## 2019-12-07 NOTE — Telephone Encounter (Signed)
Spoke with patient. Advised that Kisha, LPN, will be in contact to move generator replacement date. Pt verbalizes understanding and agreement with plan. No further questions at this time.

## 2019-12-07 NOTE — Telephone Encounter (Signed)
I told the pt the nurse will give her a call back. She states June 10 th might be better for her to get the gen change.

## 2019-12-07 NOTE — Telephone Encounter (Signed)
Patient called back to get her appointment for gen changed moved up. Can you please call patient back and get this scheduled

## 2019-12-14 ENCOUNTER — Encounter: Payer: Self-pay | Admitting: *Deleted

## 2019-12-14 NOTE — Telephone Encounter (Signed)
Gen change rescheduled for June 11 at 11:30. Pt notified.

## 2019-12-25 ENCOUNTER — Telehealth: Payer: Self-pay | Admitting: Cardiology

## 2019-12-25 NOTE — Telephone Encounter (Signed)
  Patient Consent for Virtual Visit         [name] [name] has provided verbal consent on 12/25/2019 for a virtual visit (video or telephone).   CONSENT FOR VIRTUAL VISIT FOR:  [name] [name]  By participating in this virtual visit I agree to the following:  I hereby voluntarily request, consent and authorize [facility] and its employed or contracted physicians, physician assistants, nurse practitioners or other licensed health care professionals (the Practitioner), to provide me with telemedicine health care services (the "Services") as deemed necessary by the treating Practitioner. I acknowledge and consent to receive the Services by the Practitioner via telemedicine. I understand that the telemedicine visit will involve communicating with the Practitioner through live audiovisual communication technology and the disclosure of certain medical information by electronic transmission. I acknowledge that I have been given the opportunity to request an in-person assessment or other available alternative prior to the telemedicine visit and am voluntarily participating in the telemedicine visit.  I understand that I have the right to withhold or withdraw my consent to the use of telemedicine in the course of my care at any time, without affecting my right to future care or treatment, and that the Practitioner or I may terminate the telemedicine visit at any time. I understand that I have the right to inspect all information obtained and/or recorded in the course of the telemedicine visit and may receive copies of available information for a reasonable fee.  I understand that some of the potential risks of receiving the Services via telemedicine include:  Marland Kitchen Delay or interruption in medical evaluation due to technological equipment failure or disruption; . Information transmitted may not be sufficient (e.g. poor resolution of images) to allow for appropriate medical decision making by the Practitioner;  and/or  . In rare instances, security protocols could fail, causing a breach of personal health information.  Furthermore, I acknowledge that it is my responsibility to provide information about my medical history, conditions and care that is complete and accurate to the best of my ability. I acknowledge that Practitioner's advice, recommendations, and/or decision may be based on factors not within their control, such as incomplete or inaccurate data provided by me or distortions of diagnostic images or specimens that may result from electronic transmissions. I understand that the practice of medicine is not an exact science and that Practitioner makes no warranties or guarantees regarding treatment outcomes. I acknowledge that a copy of this consent can be made available to me via my patient portal [facility] MyChart), or I can request a printed copy by calling the office of [facility].    I understand that my insurance will be billed for this visit.   I have read or had this consent read to me. . I understand the contents of this consent, which adequately explains the benefits and risks of the Services being provided via telemedicine.  . I have been provided ample opportunity to ask questions regarding this consent and the Services and have had my questions answered to my satisfaction. . I give my informed consent for the services to be provided through the use of telemedicine in my medical care

## 2019-12-28 ENCOUNTER — Telehealth (INDEPENDENT_AMBULATORY_CARE_PROVIDER_SITE_OTHER): Payer: Medicare Other | Admitting: Cardiology

## 2019-12-28 ENCOUNTER — Encounter: Payer: Self-pay | Admitting: Cardiology

## 2019-12-28 VITALS — BP 145/80 | Ht 64.0 in | Wt 133.0 lb

## 2019-12-28 DIAGNOSIS — I428 Other cardiomyopathies: Secondary | ICD-10-CM | POA: Diagnosis not present

## 2019-12-28 DIAGNOSIS — I10 Essential (primary) hypertension: Secondary | ICD-10-CM

## 2019-12-28 DIAGNOSIS — I4891 Unspecified atrial fibrillation: Secondary | ICD-10-CM

## 2019-12-28 NOTE — Progress Notes (Signed)
Virtual Visit via Telephone Note   This visit type was conducted due to national recommendations for restrictions regarding the COVID-19 Pandemic (e.g. social distancing) in an effort to limit this patient's exposure and mitigate transmission in our community.  Due to her co-morbid illnesses, this patient is at least at moderate risk for complications without adequate follow up.  This format is felt to be most appropriate for this patient at this time.  The patient did not have access to video technology/had technical difficulties with video requiring transitioning to audio format only (telephone).  All issues noted in this document were discussed and addressed.  No physical exam could be performed with this format.  Please refer to the patient's chart for her  consent to telehealth for [facility].   The patient was identified using 2 identifiers.  Date:  12/28/2019   ID:  [name] [name], [name] [name] 1956-02-22, MRN [id number]  Patient Location: Home Provider Location: Office  PCP:  System, Provider Not In  Cardiologist:  Dina Rich, MD  Electrophysiologist:  None   Evaluation Performed:  Follow-Up Visit  Chief Complaint:  Follow up  History of Present Illness:    [name] [name] is a 64 y.o. female seen today for follow up of the following medical problems.   1. NICM  - echo 10/2013 LVEF 30-35%, restrictive diastolic dysfunction - cath Jan 2011 showed no obstructive CAD  - repeat echo 10/2015 LVEF 45-50%. - 12/2017 LVEF 50-55%,    - some SOB with very high levels of exertion.  - no recent edema.Weight down from 140 to 133 lbs over the last year - compliant with meds   2. Chronic afib  - prior AV nodal ablation with permanent pacemaker    - no bleeding on xarelto, no symptoms    3. Pacemaker - she has a [employer]tronic BiV pacemaker followed by Dr Ladona Ridgel, LV lead replaced epicardial by Dr Laneta Simmers 01/2015  - normal device check 12/2019, due for gen change  next month   4. COPD -previously followed by Dr Okey Dupre to establish with pulmonary in [place]   5. HTN - bp at 11/2019 visit 122/64      SH: Moved to [place], [place], continues to come to [place] for cardiology and to visit her family   The patient does not have symptoms concerning for COVID-19 infection (fever, chills, cough, or new shortness of breath).    Past Medical History:  Diagnosis Date  . Anemia    2011: Hemoglobin of 11.4; hematocrit of 33.5; normal MCV  . Anxiety   . Anxiety and depression   . Atrial fibrillation (HCC)    Echocardiogram in 2004-borderline LV function; no valvular abnormalities; 06/2012: AV nodal ablation + biventricular pacemaker  . Cardiac arrest (HCC) 06/2012   16 day hospitalization; caused by hyperkalemia; multiple complications including CHF; permanent atrial fibrillation requiring AV nodal ablation + pacing  . CHF (congestive heart failure) (HCC)   . COPD (chronic obstructive pulmonary disease) (HCC)    heterozygous alpha-1 anti-trypsin deficiency  . Depression   . Dysrhythmia   . GERD (gastroesophageal reflux disease)    protonix  . Nonischemic cardiomyopathy (HCC)    Echo 06/2012: EF 30-35%, moderate to severe MR; cardiac cath 06/2012:30-40% D1 and D2, estimated EF-35%, MR-not graded; right bundle [name] block  . Presence of permanent cardiac pacemaker   . Shortness of breath dyspnea   . Thyroid disease   . Urinary frequency    Past Surgical History:  Procedure Laterality Date  .  AV NODE ABLATION N/A 06/21/2012   Procedure: AV NODE ABLATION;  Surgeon: Marinus Maw, MD;  Location: [facility] CATH LAB;  Service: Cardiovascular;  Laterality: N/A;  . AV NODE ABLATION N/A 06/22/2012   Procedure: AV NODE ABLATION;  Surgeon: Duke Salvia, MD;  Location: [facility] CATH LAB;  Service: Cardiovascular;  Laterality: N/A;  . BREAST BIOPSY     left, [facility]  . BREAST EXCISIONAL BIOPSY     Left x2  . CARDIOVERSION  06/19/2012   Procedure:  CARDIOVERSION;  Surgeon: Vesta Mixer, MD;  Location: [facility] ENDOSCOPY;  Service: Cardiovascular;;  . CARDIOVERSION  06/21/2012   Procedure: CARDIOVERSION;  Surgeon: Cassell Clement, MD;  Location: [facility] OR;  Service: Cardiovascular;  Laterality: N/A;  . CERVICAL CONIZATION W/BX  08/11/2011   Procedure: CONIZATION CERVIX WITH BIOPSY;  Surgeon: Lazaro Arms, MD;  Location: [facility] ORS;  Service: Gynecology;  Laterality: N/A;  Laser Conization of Cervix  . EPICARDIAL PACING LEAD PLACEMENT N/A 02/28/2015   Procedure: LV EPICARDIAL PACING LEAD PLACEMENT;  Surgeon: Alleen Borne, MD;  Location: [facility] OR;  Service: Thoracic;  Laterality: N/A;  . INSERT / REPLACE / REMOVE PACEMAKER    . LEFT HEART CATHETERIZATION WITH CORONARY ANGIOGRAM N/A 06/15/2012   Procedure: LEFT HEART CATHETERIZATION WITH CORONARY ANGIOGRAM;  Surgeon: Herby Abraham, MD;  Location: [facility] CATH LAB;  Service: Cardiovascular;  Laterality: N/A;  . PERMANENT PACEMAKER INSERTION N/A 06/21/2012   Procedure: PERMANENT PACEMAKER INSERTION;  Surgeon: Marinus Maw, MD;  Location: [facility] CATH LAB;  Service: Cardiovascular;  Laterality: N/A;  . TEE WITHOUT CARDIOVERSION  06/19/2012   Procedure: TRANSESOPHAGEAL ECHOCARDIOGRAM (TEE);  Surgeon: Vesta Mixer, MD;  Location: [facility] ENDOSCOPY;  Service: Cardiovascular;  Laterality: N/A;  . THORACOTOMY Left 02/28/2015   Procedure: THORACOTOMY MAJOR;  Surgeon: Alleen Borne, MD;  Location: [facility] OR;  Service: Thoracic;  Laterality: Left;     No outpatient medications have been marked as taking for the 12/28/19 encounter (Appointment) with [name], MD.     Allergies:   Patient has no known allergies.   Social History   Tobacco Use  . Smoking status: Never Smoker  . Smokeless tobacco: Never Used  Substance Use Topics  . Alcohol use: No    Alcohol/week: 0.0 standard drinks  . Drug use: No     Family Hx: The patient's family history includes Cancer in her father and sister; Cervical cancer in her  mother; Irritable bowel syndrome in an other family member. There is no history of Anesthesia problems, Hypotension, Malignant hyperthermia, Pseudochol deficiency, Colon cancer, Celiac disease, or Inflammatory bowel disease.  ROS:   Please see the history of present illness.     All other systems reviewed and are negative.   Prior CV studies:   The following studies were reviewed today:  06/2012 TEE: mod to severe MR, milld to mod TR   06/12/12 Cath Hemodynamics:  AO 118/68 (83)  LV 119/8  Cannot calculate gradient due to rapid atrial fib  Coronary angiography:  Coronary dominance: right  Left mainstem: Short without significant disease  Left anterior descending (LAD): There is calcification proximally. There are two major diagonal branches. Between the first and second diagonal branches are approximately 30-40% plaquing. The distal LAD has mild irregularity. Both diagonals have minimal irregularity.  Left circumflex (LCx): Provides one large bifurcation marginal Rosalie Buenaventura and a small AV circumflex. There is minor distal irregularity. The AV circ is small  Right coronary artery (RCA): Provides a  large PDA and a large and smaller PLA branches. No significant focal obstruction.  Left ventriculography: Due to ectopy, EF cannot be calculated. With AF, difficult to be sure of function but appears 35%. There does appear to be some MR.  Final Conclusions:  1. Atrial fib with rapid ventricular response.  2. Mild calcification of proximal LAD without critical obstruction. No high grade coronary artery disease.  09/2012 Echo: LVEF 25%, severely dilated LV, mild LVH, mild AI, mild MR   09/26/13 Clinic EKG Afib, V-paced  11/08/13 Echo Study Conclusions  - Left ventricle: The cavity size was moderately dilated. Wall thickness was increased in a pattern of mild LVH. Systolic function was moderately to severely reduced. The estimated ejection fraction was in the range of 30%  to 35%. Diffuse hypokinesis. There is severe hypokinesis of the anteroseptal myocardium. Doppler parameters are consistent with restrictive physiology, indicative of decreased left ventricular diastolic compliance and/or increased left atrial pressure. - Ventricular septum: Septal motion showed abnormal function and dyssynergy. - Aortic valve: Trileaflet; mildly thickened leaflets. Mild to moderate regurgitation. - Mitral valve: Mildly thickened leaflets . Moderate regurgitation, appears to be made up of two jets. Suboptimal PISA. - Left atrium: The atrium was severely dilated. - Right ventricle: The cavity size was mildly dilated. Pacer wire or catheter noted in right ventricle. - Right atrium: The atrium was moderately to severely dilated. - Tricuspid valve: Mild-moderate regurgitation. - Pulmonic valve: Mild regurgitation. - Pulmonary arteries: Systolic pressure was moderately increased. - Pericardium, extracardiac: There was no pericardial effusion. Impressions:  - Mild LVH with moderate chamber dilatation and LVEF 30-35% with diffuse hypokinesis, most prominent anteroseptal wall in setting of septal dyssynergy and pacing. Restrictive diastolic filling pattern with increased filling pressures. Severe left atrial and moderate to severe right atrial enlargement. MIldly thickened mitral valve with overall moderate mitral regurgitation made up of two jets (PISA suboptimal). Mild to moderate aortic regurgitation. Device wire in right heart. MIld to moderate tricuspid regurgitation with PASP 51 mmHg.   10/2015 echo Study Conclusions  - Left ventricle: The cavity size was normal. Systolic function was mildly reduced. The estimated ejection fraction was approximately 45%. Diffuse hypokinesis. Diastolic dysfunction, indeterminate grade and filling pressures. Mild septal thickening. - Aortic valve: Mildly to moderately calcified annulus. Trileaflet. There was mild to  moderate regurgitation. - Mitral valve: There was moderate regurgitation. - Left atrium: The atrium was severely dilated. - Right ventricle: Pacer wire or catheter noted in right ventricle. - Right atrium: The atrium was mildly to moderately dilated. - Tricuspid valve: There was mild regurgitation. - Pulmonic valve: There was mild regurgitation.  Labs/Other Tests and Data Reviewed:    EKG:  No ECG reviewed.  Recent Labs: No results found for requested labs within last 8760 hours.   Recent Lipid Panel Lab Results  Component Value Date/Time   CHOL 169 04/25/2018 11:52 AM   TRIG 121 04/25/2018 11:52 AM   HDL 40 (L) 04/25/2018 11:52 AM   CHOLHDL 4.2 04/25/2018 11:52 AM   LDLCALC 107 (H) 04/25/2018 11:52 AM    Wt Readings from Last 3 Encounters:  11/14/19 136 lb 6.4 oz (61.9 kg)  08/07/19 142 lb (64.4 kg)  07/03/19 148 lb (67.1 kg)     Objective:    Vital Signs:   Today's Vitals   12/28/19 0903  BP: (!) 145/80  Weight: 133 lb (60.3 kg)  Height: 5\' 4"  (1.626 m)   Body mass index is 22.83 kg/m. Normal affect. Normal speech pattern and  tone. Comfortable, no apparent distress. No audible signs of sob or wheezing.   ASSESSMENT & PLAN:    1. NICM/Chronic systolic HF -LVEF has normalized - no symptoms, has actually lost weight over the last few months - continue current meds  2. Afib  - hx of av nodal ablation with pacemaker  -no symptoms, conitnue current therapy.   3. Pacemaker -due for gen change next month with Dr Lovena Le  4. HTN -home bp elevated today but was at goal last month at EP visit - continue current meds  COVID-19 Education: The signs and symptoms of COVID-19 were discussed with the patient and how to seek care for testing (follow up with PCP or arrange E-visit).  The importance of social distancing was discussed today.  Time:   Today, I have spent 20 minutes with the patient with telehealth technology discussing the above problems.      Medication Adjustments/Labs and Tests Ordered: Current medicines are reviewed at length with the patient today.  Concerns regarding medicines are outlined above.   Tests Ordered: No orders of the defined types were placed in this encounter.   Medication Changes: No orders of the defined types were placed in this encounter.   Follow Up:  Either In Person or Virtual in 6 month(s)  Signed, Carlyle Dolly, MD  12/28/2019 8:57 AM    [place]

## 2019-12-28 NOTE — Patient Instructions (Signed)

## 2020-01-03 ENCOUNTER — Other Ambulatory Visit: Payer: Self-pay | Admitting: Cardiology

## 2020-01-04 ENCOUNTER — Telehealth: Payer: Self-pay

## 2020-01-04 ENCOUNTER — Ambulatory Visit (INDEPENDENT_AMBULATORY_CARE_PROVIDER_SITE_OTHER): Payer: Medicare Other | Admitting: *Deleted

## 2020-01-04 DIAGNOSIS — I428 Other cardiomyopathies: Secondary | ICD-10-CM

## 2020-01-04 NOTE — Telephone Encounter (Signed)
Spoke with patient to remind of missed remote transmission 

## 2020-01-06 LAB — CUP PACEART REMOTE DEVICE CHECK
Battery Remaining Longevity: 2 mo
Battery Voltage: 2.73 V
Brady Statistic AP VP Percent: 0 %
Brady Statistic AP VS Percent: 0 %
Brady Statistic AS VP Percent: 0 %
Brady Statistic AS VS Percent: 0 %
Brady Statistic RA Percent Paced: 0 %
Brady Statistic RV Percent Paced: 88.82 %
Date Time Interrogation Session: 20210605021556
Implantable Lead Implant Date: 20131120
Implantable Lead Implant Date: 20131120
Implantable Lead Implant Date: 20131120
Implantable Lead Location: 753858
Implantable Lead Location: 753859
Implantable Lead Location: 753860
Implantable Lead Model: 4196
Implantable Lead Model: 5076
Implantable Lead Model: 5076
Implantable Pulse Generator Implant Date: 20131120
Lead Channel Impedance Value: 304 Ohm
Lead Channel Impedance Value: 361 Ohm
Lead Channel Impedance Value: 380 Ohm
Lead Channel Impedance Value: 4047 Ohm
Lead Channel Impedance Value: 4047 Ohm
Lead Channel Impedance Value: 4047 Ohm
Lead Channel Impedance Value: 418 Ohm
Lead Channel Impedance Value: 475 Ohm
Lead Channel Impedance Value: 513 Ohm
Lead Channel Pacing Threshold Amplitude: 0.5 V
Lead Channel Pacing Threshold Amplitude: 2.125 V
Lead Channel Pacing Threshold Pulse Width: 0.4 ms
Lead Channel Pacing Threshold Pulse Width: 1 ms
Lead Channel Sensing Intrinsic Amplitude: 0.5 mV
Lead Channel Sensing Intrinsic Amplitude: 0.5 mV
Lead Channel Sensing Intrinsic Amplitude: 3.125 mV
Lead Channel Sensing Intrinsic Amplitude: 3.125 mV
Lead Channel Setting Pacing Amplitude: 2 V
Lead Channel Setting Pacing Amplitude: 3.25 V
Lead Channel Setting Pacing Pulse Width: 0.4 ms
Lead Channel Setting Pacing Pulse Width: 1 ms
Lead Channel Setting Sensing Sensitivity: 2.8 mV

## 2020-01-08 ENCOUNTER — Other Ambulatory Visit (HOSPITAL_COMMUNITY): Payer: Medicare Other

## 2020-01-08 NOTE — Addendum Note (Signed)
Addended by: Elease Etienne A on: 01/08/2020 03:47 PM   Modules accepted: Level of Service

## 2020-01-08 NOTE — Progress Notes (Signed)
Remote pacemaker transmission.   

## 2020-01-09 ENCOUNTER — Other Ambulatory Visit (HOSPITAL_COMMUNITY)
Admission: RE | Admit: 2020-01-09 | Discharge: 2020-01-09 | Disposition: A | Discharge status: Home / Self Care | Payer: Medicare Other | Source: Ambulatory Visit | Attending: Internal Medicine | Admitting: Internal Medicine

## 2020-01-09 DIAGNOSIS — I5022 Chronic systolic (congestive) heart failure: Secondary | ICD-10-CM | POA: Diagnosis present

## 2020-01-09 DIAGNOSIS — I428 Other cardiomyopathies: Secondary | ICD-10-CM | POA: Diagnosis present

## 2020-01-09 DIAGNOSIS — Z950 Presence of cardiac pacemaker: Secondary | ICD-10-CM | POA: Insufficient documentation

## 2020-01-09 DIAGNOSIS — I4891 Unspecified atrial fibrillation: Secondary | ICD-10-CM | POA: Insufficient documentation

## 2020-01-09 LAB — CBC
HCT: 31.2 % — ABNORMAL LOW (ref 36.0–46.0)
Hemoglobin: 9.8 g/dL — ABNORMAL LOW (ref 12.0–15.0)
MCH: 32.3 pg (ref 26.0–34.0)
MCHC: 31.4 g/dL (ref 30.0–36.0)
MCV: 103 fL — ABNORMAL HIGH (ref 80.0–100.0)
Platelets: 93 10*3/uL — ABNORMAL LOW (ref 150–400)
RBC: 3.03 MIL/uL — ABNORMAL LOW (ref 3.87–5.11)
RDW: 15.9 % — ABNORMAL HIGH (ref 11.5–15.5)
WBC: 3.8 10*3/uL — ABNORMAL LOW (ref 4.0–10.5)
nRBC: 0 % (ref 0.0–0.2)

## 2020-01-09 LAB — BASIC METABOLIC PANEL
Anion gap: 8 (ref 5–15)
BUN: 18 mg/dL (ref 8–23)
CO2: 27 mmol/L (ref 22–32)
Calcium: 9.4 mg/dL (ref 8.9–10.3)
Chloride: 104 mmol/L (ref 98–111)
Creatinine, Ser: 0.78 mg/dL (ref 0.44–1.00)
GFR calc Af Amer: >60 mL/min (ref >60)
GFR calc non Af Amer: >60 mL/min (ref >60)
Glucose, Bld: 107 mg/dL — ABNORMAL HIGH (ref 70–99)
Potassium: 3.6 mmol/L (ref 3.5–5.1)
Sodium: 139 mmol/L (ref 135–145)

## 2020-01-11 ENCOUNTER — Encounter (HOSPITAL_COMMUNITY): Payer: Self-pay | Admitting: Internal Medicine

## 2020-01-11 ENCOUNTER — Ambulatory Visit (HOSPITAL_COMMUNITY)
Admission: RE | Admit: 2020-01-11 | Discharge: 2020-01-11 | Disposition: A | Discharge status: Home / Self Care | Payer: Medicare Other | Attending: Internal Medicine | Admitting: Internal Medicine

## 2020-01-11 ENCOUNTER — Other Ambulatory Visit: Payer: Self-pay

## 2020-01-11 ENCOUNTER — Encounter (HOSPITAL_COMMUNITY)
Admission: RE | Disposition: A | Discharge status: Home / Self Care | Payer: Self-pay | Source: Home / Self Care | Attending: Internal Medicine

## 2020-01-11 DIAGNOSIS — Z8674 Personal history of sudden cardiac arrest: Secondary | ICD-10-CM | POA: Insufficient documentation

## 2020-01-11 DIAGNOSIS — F329 Major depressive disorder, single episode, unspecified: Secondary | ICD-10-CM | POA: Insufficient documentation

## 2020-01-11 DIAGNOSIS — J449 Chronic obstructive pulmonary disease, unspecified: Secondary | ICD-10-CM | POA: Diagnosis not present

## 2020-01-11 DIAGNOSIS — I4821 Permanent atrial fibrillation: Secondary | ICD-10-CM | POA: Diagnosis not present

## 2020-01-11 DIAGNOSIS — I442 Atrioventricular block, complete: Secondary | ICD-10-CM | POA: Diagnosis not present

## 2020-01-11 DIAGNOSIS — Z7901 Long term (current) use of anticoagulants: Secondary | ICD-10-CM | POA: Insufficient documentation

## 2020-01-11 DIAGNOSIS — E079 Disorder of thyroid, unspecified: Secondary | ICD-10-CM | POA: Insufficient documentation

## 2020-01-11 DIAGNOSIS — F419 Anxiety disorder, unspecified: Secondary | ICD-10-CM | POA: Insufficient documentation

## 2020-01-11 DIAGNOSIS — I5022 Chronic systolic (congestive) heart failure: Secondary | ICD-10-CM | POA: Insufficient documentation

## 2020-01-11 DIAGNOSIS — K219 Gastro-esophageal reflux disease without esophagitis: Secondary | ICD-10-CM | POA: Diagnosis not present

## 2020-01-11 DIAGNOSIS — Z79899 Other long term (current) drug therapy: Secondary | ICD-10-CM | POA: Diagnosis not present

## 2020-01-11 DIAGNOSIS — Z45010 Encounter for checking and testing of cardiac pacemaker pulse generator [battery]: Secondary | ICD-10-CM | POA: Diagnosis present

## 2020-01-11 DIAGNOSIS — I428 Other cardiomyopathies: Secondary | ICD-10-CM | POA: Insufficient documentation

## 2020-01-11 HISTORY — PX: PPM GENERATOR CHANGEOUT: EP1233

## 2020-01-11 SURGERY — PPM GENERATOR CHANGEOUT
Anesthesia: LOCAL

## 2020-01-11 MED ORDER — LIDOCAINE HCL 1 % IJ SOLN
INTRAMUSCULAR | Status: AC
  Filled 2020-01-11: qty 60

## 2020-01-11 MED ORDER — FENTANYL CITRATE (PF) 100 MCG/2ML IJ SOLN
INTRAMUSCULAR | Status: AC
  Filled 2020-01-11: qty 2

## 2020-01-11 MED ORDER — CHLORHEXIDINE GLUCONATE 4 % EX LIQD
4.0000 "application " | Freq: Once | CUTANEOUS | Status: DC
  Filled 2020-01-11: qty 60

## 2020-01-11 MED ORDER — LIDOCAINE HCL (PF) 1 % IJ SOLN
INTRAMUSCULAR | Status: AC
  Filled 2020-01-11: qty 60

## 2020-01-11 MED ORDER — MIDAZOLAM HCL 5 MG/5ML IJ SOLN
INTRAMUSCULAR | Status: AC
  Filled 2020-01-11: qty 5

## 2020-01-11 MED ORDER — SODIUM CHLORIDE 0.9 % IV SOLN
80.0000 mg | INTRAVENOUS | Status: AC
  Administered 2020-01-11: 80 mg

## 2020-01-11 MED ORDER — SODIUM CHLORIDE 0.9 % IV SOLN: INTRAVENOUS | Status: DC

## 2020-01-11 MED ORDER — CEFAZOLIN SODIUM-DEXTROSE 2-4 GM/100ML-% IV SOLN
2.0000 g | INTRAVENOUS | Status: AC
  Administered 2020-01-11: 2 g via INTRAVENOUS

## 2020-01-11 MED ORDER — ACETAMINOPHEN 325 MG PO TABS
325.0000 mg | ORAL_TABLET | ORAL | Status: DC | PRN
Start: 2020-01-11 — End: 2020-01-11

## 2020-01-11 MED ORDER — LIDOCAINE HCL (PF) 1 % IJ SOLN
INTRAMUSCULAR | Status: DC | PRN
  Administered 2020-01-11: 60 mL

## 2020-01-11 MED ORDER — MIDAZOLAM HCL 5 MG/5ML IJ SOLN
INTRAMUSCULAR | Status: DC | PRN
  Administered 2020-01-11: 2 mg via INTRAVENOUS
  Administered 2020-01-11: 1 mg via INTRAVENOUS

## 2020-01-11 MED ORDER — SODIUM CHLORIDE 0.9 % IV SOLN
INTRAVENOUS | Status: AC
  Filled 2020-01-11: qty 2

## 2020-01-11 MED ORDER — ONDANSETRON HCL 4 MG/2ML IJ SOLN: 4.0000 mg | Freq: Four times a day (QID) | INTRAMUSCULAR | Status: DC | PRN

## 2020-01-11 MED ORDER — CEFAZOLIN SODIUM-DEXTROSE 2-4 GM/100ML-% IV SOLN
INTRAVENOUS | Status: AC
  Filled 2020-01-11: qty 100

## 2020-01-11 MED ORDER — HEPARIN (PORCINE) IN NACL 1000-0.9 UT/500ML-% IV SOLN
INTRAVENOUS | Status: AC
  Filled 2020-01-11: qty 500

## 2020-01-11 MED ORDER — FENTANYL CITRATE (PF) 100 MCG/2ML IJ SOLN
INTRAMUSCULAR | Status: DC | PRN
  Administered 2020-01-11: 25 ug via INTRAVENOUS

## 2020-01-11 SURGICAL SUPPLY — 5 items
CABLE SURGICAL S-101-97-12 (CABLE) ×2 IMPLANT
PACEMAKER PRCT MRI CRTP W1TR01 (Pacemaker) ×1 IMPLANT
PAD PRO RADIOLUCENT 2001M-C (PAD) ×2 IMPLANT
PPM PRECEPTA MRI CRT-P W1TR01 (Pacemaker) ×2 IMPLANT
TRAY PACEMAKER INSERTION (PACKS) ×2 IMPLANT

## 2020-01-11 NOTE — H&P (Signed)
HPI [name] [name] returns today for followup. She is a pleasant 64 yo woman with chronic systolic heart failure, chronic atrial fib s/p AV node ablation with CHB, s/p biv PPM insertion who is approaching ERI. She has moved to [place]. She feels well. She denies chest pain or sob. No syncope. No edema.  No Known Allergies         Current Outpatient Medications  Medication Sig Dispense Refill  . albuterol (VENTOLIN HFA) 108 (90 Base) MCG/ACT inhaler Inhale 2 puffs into the lungs every 6 (six) hours as needed for wheezing or shortness of breath. 1 Inhaler 2  . ALPRAZolam (XANAX) 0.25 MG tablet Take 0.25 mg by mouth 2 (two) times daily as needed for anxiety.    . calcium carbonate (TUMS - DOSED IN MG ELEMENTAL CALCIUM) 500 MG chewable tablet Chew 2 tablets by mouth 3 (three) times daily.    . carvedilol (COREG) 12.5 MG tablet TAKE ONE TABLET BY MOUTH TWICE DAILY WITH MEALS 180 tablet 3  . ferrous sulfate 325 (65 FE) MG tablet Take 325 mg by mouth daily with breakfast.    . furosemide (LASIX) 40 MG tablet Take 40 mg daily. I weight is above 146 lbs take an extra 20 mg. 120 tablet 3  . lisinopril (ZESTRIL) 5 MG tablet Take 1 tablet (5 mg total) by mouth daily. 90 tablet 3  . magnesium oxide (MAG-OX) 400 (241.3 Mg) MG tablet Take 1 tablet (400 mg total) by mouth daily. 90 tablet 0  . pantoprazole (PROTONIX) 40 MG tablet Take 40 mg by mouth daily.    . sertraline (ZOLOFT) 50 MG tablet Take 50 mg by mouth daily.    Marland Kitchen SPIRIVA HANDIHALER 18 MCG inhalation capsule INHALE ONE PUFF BY MOUTH EVERY DAY 30 capsule 1  . tiotropium (SPIRIVA HANDIHALER) 18 MCG inhalation capsule INHALE ONE DOSE IN INHALER BY MOUTH ONCE DAILY 90 capsule 0  . XARELTO 20 MG TABS tablet TAKE ONE TABLET BY MOUTH DAILY 90 tablet 1  . zolpidem (AMBIEN) 5 MG tablet Take 5 mg by mouth at bedtime. For sleep     No current facility-administered medications for this visit.         Past Medical History:   Diagnosis Date  . Anemia    2011: Hemoglobin of 11.4; hematocrit of 33.5; normal MCV  . Anxiety   . Anxiety and depression   . Atrial fibrillation (HCC)    Echocardiogram in 2004-borderline LV function; no valvular abnormalities; 06/2012: AV nodal ablation + biventricular pacemaker  . Cardiac arrest (HCC) 06/2012   16 day hospitalization; caused by hyperkalemia; multiple complications including CHF; permanent atrial fibrillation requiring AV nodal ablation + pacing  . CHF (congestive heart failure) (HCC)   . COPD (chronic obstructive pulmonary disease) (HCC)    heterozygous alpha-1 anti-trypsin deficiency  . Depression   . Dysrhythmia   . GERD (gastroesophageal reflux disease)    protonix  . Nonischemic cardiomyopathy (HCC)    Echo 06/2012: EF 30-35%, moderate to severe MR; cardiac cath 06/2012:30-40% D1 and D2, estimated EF-35%, MR-not graded; right bundle branch block  . Presence of permanent cardiac pacemaker   . Shortness of breath dyspnea   . Thyroid disease   . Urinary frequency     ROS:   All systems reviewed and negative except as noted in the HPI.        Past Surgical History:  Procedure Laterality Date  . AV NODE ABLATION N/A 06/21/2012   Procedure:  AV NODE ABLATION;  Surgeon: Evans Lance, MD;  Location: [facility] CATH LAB;  Service: Cardiovascular;  Laterality: N/A;  . AV NODE ABLATION N/A 06/22/2012   Procedure: AV NODE ABLATION;  Surgeon: Deboraha Sprang, MD;  Location[facility] CATH LAB;  Service: Cardiovascular;  Laterality: N/A;  . BREAST BIOPSY     left, [facility]  . BREAST EXCISIONAL BIOPSY     Left x2  . CARDIOVERSION  06/19/2012   Procedure: CARDIOVERSION;  Surgeon: Thayer Headings, MD;  Location: [place];  Service: Cardiovascular;;  . CARDIOVERSION  06/21/2012   Procedure: CARDIOVERSION;  Surgeon: Darlin Coco, MD;  Location: [place];  Service: Cardiovascular;  Laterality: N/A;  . CERVICAL CONIZATION W/BX  08/11/2011    Procedure: CONIZATION CERVIX WITH BIOPSY;  Surgeon: Florian Buff, MD;  Location: [facility];  Service: Gynecology;  Laterality: N/A;  Laser Conization of Cervix  . EPICARDIAL PACING LEAD PLACEMENT N/A 02/28/2015   Procedure: LV EPICARDIAL PACING LEAD PLACEMENT;  Surgeon: Gaye Pollack, MD;  Location: [place];  Service: Thoracic;  Laterality: N/A;  . INSERT / REPLACE / REMOVE PACEMAKER    . LEFT HEART CATHETERIZATION WITH CORONARY ANGIOGRAM N/A 06/15/2012   Procedure: LEFT HEART CATHETERIZATION WITH CORONARY ANGIOGRAM;  Surgeon: Hillary Bow, MD;  Location: [facility] CATH LAB;  Service: Cardiovascular;  Laterality: N/A;  . PERMANENT PACEMAKER INSERTION N/A 06/21/2012   Procedure: PERMANENT PACEMAKER INSERTION;  Surgeon: Evans Lance, MD;  Location: [facility] CATH LAB;  Service: Cardiovascular;  Laterality: N/A;  . TEE WITHOUT CARDIOVERSION  06/19/2012   Procedure: TRANSESOPHAGEAL ECHOCARDIOGRAM (TEE);  Surgeon: Thayer Headings, MD;  Location: [place];  Service: Cardiovascular;  Laterality: N/A;  . THORACOTOMY Left 02/28/2015   Procedure: THORACOTOMY MAJOR;  Surgeon: Gaye Pollack, MD;  Location: [facility] OR;  Service: Thoracic;  Laterality: Left;          Family History  Problem Relation Age of Onset  . Cancer Father        carcinoma of the lung  . Cervical cancer Mother   . Cancer Sister        carcinoma of the lung  . Irritable bowel syndrome Other        mother, sister  . Anesthesia problems Neg Hx   . Hypotension Neg Hx   . Malignant hyperthermia Neg Hx   . Pseudochol deficiency Neg Hx   . Colon cancer Neg Hx   . Celiac disease Neg Hx   . Inflammatory bowel disease Neg Hx      Social History        Socioeconomic History  . Marital status: Divorced    Spouse name: Not on file  . Number of children: 2  . Years of education: Not on file  . Highest education level: Not on file  Occupational History  . Occupation: Surveyor, quantity: [employer]     CommentEducation officer, museum  . 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 Smoking status: Never Smoker  . Smokeless tobacco: Never Used  Substance and Sexual Activity  . Alcohol use: No    Alcohol/week: 0.0 standard drinks  . Drug use: No  . Sexual activity: Not on file  Other Topics Concern  . Not on file  Social History Narrative   ** Merged History Encounter **       ** Data from: 06/22/12 Enc Dept: [facility]       **  Data from: 06/05/12 Enc Dept: [facility]   Lives alone with good family support from sisters.          Social Determinants of Health      Financial Resource Strain:   . Difficulty of Paying Living Expenses:   Food Insecurity:   . Worried About Programme researcher, broadcasting/film/video in the Last Year:   . Barista in the Last Year:   Transportation Needs:   . Freight forwarder (Medical):   Marland Kitchen Lack of Transportation (Non-Medical):   Physical Activity:   . Days of Exercise per Week:   . Minutes of Exercise per Session:   Stress:   . Feeling of Stress :   Social Connections:   . Frequency of Communication with Friends and Family:   . Frequency of Social Gatherings with Friends and Family:   . Attends Religious Services:   . Active Member of Clubs or Organizations:   . Attends Banker Meetings:   Marland Kitchen Marital Status:   Intimate Partner Violence:   . Fear of Current or Ex-Partner:   . Emotionally Abused:   Marland Kitchen Physically Abused:   . Sexually Abused:      BP 122/64   Pulse 98   Temp (!) 97.5 F (36.4 C)   Ht 5\' 4"  (1.626 m)   Wt 136 lb 6.4 oz (61.9 kg)   SpO2 97%   BMI 23.41 kg/m   Physical Exam:  Well appearing NAD HEENT: Unremarkable Neck:  No JVD, no thyromegally Lymphatics:  No adenopathy Back:  No CVA tenderness Lungs:  Clear with no wheezes HEART:  Regular rate rhythm, no murmurs, no rubs, no clicks Abd:  soft, positive bowel sounds, no organomegally, no  rebound, no guarding Ext:  2 plus pulses, no edema, no cyanosis, no clubbing Skin:  No rashes no nodules Neuro:  CN II through XII intact, motor grossly intact  DEVICE  Normal device function.  See PaceArt for details. 2 mo from ERI  Assess/Plan: 1. CHB - she is asymptomatic, s/p PPM insertion.  2. Chronic systolic heart failure - she is s/p biv PPM insertion and is doing very well. Her symptoms are class 2A.  3. Atrial fib - her rates are well controlled after AV node ablation. 4. PPM - her medtronic Biv PPM is working normally. She has an epicardial LV lead.  .     EP Attending  Patient seen and examined. Agree with above. The patient is doing well but has reached ERI. I have discussed the indications/risks/benefits/goals/expectations of PPM gen change and she wishes to proceed.  Gerhard Munch.D.

## 2020-01-11 NOTE — Discharge Instructions (Signed)
Battery Change, Care After  This sheet gives you information about how to care for yourself after your procedure. Your health care provider may also give you more specific instructions. If you have problems or questions, contact your health care provider. What can I expect after the procedure? After your procedure, it is common to have:  Pain or soreness at the site where the cardiac device was inserted.  Swelling at the site where the cardiac device was inserted.  You should received an information card for your new device in 4-8 weeks. Follow these instructions at home: Incision care   Keep the incision clean and dry. ? Do not take baths, swim, or use a hot tub until after your wound check.  ? Do not shower for at least 7 days, or as directed by your health care provider. ? Pat the area dry with a clean towel. Do not rub the area. This may cause bleeding.  Follow instructions from your health care provider about how to take care of your incision. Make sure you: ? Leave stitches (sutures), skin glue, or adhesive strips in place. These skin closures may need to stay in place for 2 weeks or longer. If adhesive strip edges start to loosen and curl up, you may trim the loose edges. Do not remove adhesive strips completely unless your health care provider tells you to do that.  Check your incision area every day for signs of infection. Check for: ? More redness, swelling, or pain. ? More fluid or blood. ? Warmth. ? Pus or a bad smell. Activity  Do not lift anything that is heavier than 10 lb (4.5 kg) until your health care provider says it is okay to do so.  For the first week, or as long as told by your health care provider: ? Avoid lifting your affected arm higher than your shoulder. ? After 1 week, Be gentle when you move your arms over your head. It is okay to raise your arm to comb your hair. ? Avoid strenuous exercise.  Ask your health care provider when it is okay  to: ? Resume your normal activities. ? Return to work or school. ? Resume sexual activity. Eating and drinking  Eat a heart-healthy diet. This should include plenty of fresh fruits and vegetables, whole grains, low-fat dairy products, and lean protein like chicken and fish.  Limit alcohol intake to no more than 1 drink a day for non-pregnant women and 2 drinks a day for men. One drink equals 12 oz of beer, 5 oz of wine, or 1 oz of hard liquor.  Check ingredients and nutrition facts on packaged foods and beverages. Avoid the following types of food: ? Food that is high in salt (sodium). ? Food that is high in saturated fat, like full-fat dairy or red meat. ? Food that is high in trans fat, like fried food. ? Food and drinks that are high in sugar. Lifestyle  Do not use any products that contain nicotine or tobacco, such as cigarettes and e-cigarettes. If you need help quitting, ask your health care provider.  Take steps to manage and control your weight.  Once cleared, get regular exercise. Aim for 150 minutes of moderate-intensity exercise (such as walking or yoga) or 75 minutes of vigorous exercise (such as running or swimming) each week.  Manage other health problems, such as diabetes or high blood pressure. Ask your health care provider how you can manage these conditions. General instructions  Do not drive for   24 hours after your procedure if you were given a medicine to help you relax (sedative).  Take over-the-counter and prescription medicines only as told by your health care provider.  Avoid putting pressure on the area where the cardiac device was placed.  If you need an MRI after your cardiac device has been placed, be sure to tell the health care provider who orders the MRI that you have a cardiac device.  Avoid close and prolonged exposure to electrical devices that have strong magnetic fields. These include: ? Cell phones. Avoid keeping them in a pocket near the  cardiac device, and try using the ear opposite the cardiac device. ? MP3 players. ? Household appliances, like microwaves. ? Metal detectors. ? Electric generators. ? High-tension wires.  Keep all follow-up visits as directed by your health care provider. This is important. Contact a health care provider if:  You have pain at the incision site that is not relieved by over-the-counter or prescription medicines.  You have any of these around your incision site or coming from it: ? More redness, swelling, or pain. ? Fluid or blood. ? Warmth to the touch. ? Pus or a bad smell.  You have a fever.  You feel brief, occasional palpitations, light-headedness, or any symptoms that you think might be related to your heart. Get help right away if:  You experience chest pain that is different from the pain at the cardiac device site.  You develop a red streak that extends above or below the incision site.  You experience shortness of breath.  You have palpitations or an irregular heartbeat.  You have light-headedness that does not go away quickly.  You faint or have dizzy spells.  Your pulse suddenly drops or increases rapidly and does not return to normal.  You begin to gain weight and your legs and ankles swell. Summary  After your procedure, it is common to have pain, soreness, and some swelling where the cardiac device was inserted.  Make sure to keep your incision clean and dry. Follow instructions from your health care provider about how to take care of your incision.  Check your incision every day for signs of infection, such as more pain or swelling, pus or a bad smell, warmth, or leaking fluid and blood.  Avoid strenuous exercise and lifting your left arm higher than your shoulder for 2 weeks, or as long as told by your health care provider. This information is not intended to replace advice given to you by your health care provider. Make sure you discuss any questions you  have with your health care provider. 

## 2020-01-14 MED FILL — Lidocaine HCl Local Inj 1%: INTRAMUSCULAR | Qty: 60 | Status: AC

## 2020-01-14 MED FILL — Heparin Sod (Porcine)-NaCl IV Soln 1000 Unit/500ML-0.9%: INTRAVENOUS | Qty: 500 | Status: AC

## 2020-01-22 ENCOUNTER — Ambulatory Visit: Payer: Medicare Other

## 2020-01-24 ENCOUNTER — Telehealth: Payer: Self-pay

## 2020-01-24 ENCOUNTER — Other Ambulatory Visit: Payer: Self-pay

## 2020-01-24 ENCOUNTER — Ambulatory Visit (INDEPENDENT_AMBULATORY_CARE_PROVIDER_SITE_OTHER): Payer: Medicare Other | Admitting: Emergency Medicine

## 2020-01-24 DIAGNOSIS — I428 Other cardiomyopathies: Secondary | ICD-10-CM

## 2020-01-24 DIAGNOSIS — Z950 Presence of cardiac pacemaker: Secondary | ICD-10-CM | POA: Diagnosis not present

## 2020-01-24 DIAGNOSIS — I469 Cardiac arrest, cause unspecified: Secondary | ICD-10-CM | POA: Diagnosis not present

## 2020-01-24 NOTE — Telephone Encounter (Signed)
The pt states she may be a little late to her appointment.

## 2020-01-25 ENCOUNTER — Other Ambulatory Visit (HOSPITAL_COMMUNITY): Payer: Medicare Other

## 2020-01-26 ENCOUNTER — Other Ambulatory Visit: Payer: Self-pay | Admitting: Cardiology

## 2020-02-06 LAB — CUP PACEART INCLINIC DEVICE CHECK
Battery Remaining Longevity: 76 mo
Battery Voltage: 3.19 V
Brady Statistic AP VP Percent: 0 %
Brady Statistic AP VS Percent: 0 %
Brady Statistic AS VP Percent: 77.69 %
Brady Statistic AS VS Percent: 22.31 %
Brady Statistic RA Percent Paced: 0 %
Brady Statistic RV Percent Paced: 77.69 %
Date Time Interrogation Session: 20210624125600
Implantable Lead Implant Date: 20131120
Implantable Lead Implant Date: 20131120
Implantable Lead Implant Date: 20131120
Implantable Lead Location: 753858
Implantable Lead Location: 753859
Implantable Lead Location: 753860
Implantable Lead Model: 4196
Implantable Lead Model: 5076
Implantable Lead Model: 5076
Implantable Pulse Generator Implant Date: 20210611
Lead Channel Impedance Value: 304 Ohm
Lead Channel Impedance Value: 304 Ohm
Lead Channel Impedance Value: 3401 Ohm
Lead Channel Impedance Value: 3401 Ohm
Lead Channel Impedance Value: 3401 Ohm
Lead Channel Impedance Value: 342 Ohm
Lead Channel Impedance Value: 418 Ohm
Lead Channel Impedance Value: 456 Ohm
Lead Channel Impedance Value: 551 Ohm
Lead Channel Pacing Threshold Amplitude: 0.5 V
Lead Channel Pacing Threshold Amplitude: 2.25 V
Lead Channel Pacing Threshold Pulse Width: 0.4 ms
Lead Channel Pacing Threshold Pulse Width: 0.4 ms
Lead Channel Sensing Intrinsic Amplitude: 7.75 mV
Lead Channel Setting Pacing Amplitude: 2 V
Lead Channel Setting Pacing Amplitude: 3.75 V
Lead Channel Setting Pacing Pulse Width: 0.4 ms
Lead Channel Setting Pacing Pulse Width: 0.4 ms
Lead Channel Setting Sensing Sensitivity: 0.9 mV

## 2020-02-06 NOTE — Progress Notes (Signed)
Wound check appointment for CRT-P generator change. Steri-strips removed. Wound without redness or edema. Incision edges approximated, wound well healed. Normal device function. Thresholds, sensing, and impedances consistent with implant measurements. Device programmed at chronic settings due to mature leads. Histogram distribution appropriate for patient and level of activity. 34 high ventricular rates  noted with EGMs that shows NSVT with longest episode  16 beats in length. Patient educated about wound care. ROV with Dr Ladona Ridgel 04/15/20. Enrolled in remote monitoring , next remote scheduled for 04/11/20 and every 3 months.

## 2020-03-03 ENCOUNTER — Other Ambulatory Visit: Payer: Self-pay | Admitting: Cardiology

## 2020-03-21 ENCOUNTER — Other Ambulatory Visit: Payer: Self-pay | Admitting: Cardiology

## 2020-04-11 ENCOUNTER — Ambulatory Visit (INDEPENDENT_AMBULATORY_CARE_PROVIDER_SITE_OTHER): Payer: 59 | Admitting: *Deleted

## 2020-04-11 DIAGNOSIS — I429 Cardiomyopathy, unspecified: Secondary | ICD-10-CM

## 2020-04-11 LAB — CUP PACEART REMOTE DEVICE CHECK
Battery Remaining Longevity: 94 mo
Battery Voltage: 3.15 V
Brady Statistic AP VP Percent: 0 %
Brady Statistic AP VS Percent: 0 %
Brady Statistic AS VP Percent: 82.35 %
Brady Statistic AS VS Percent: 17.65 %
Brady Statistic RA Percent Paced: 0 %
Brady Statistic RV Percent Paced: 82.35 %
Date Time Interrogation Session: 20210910081233
Implantable Lead Implant Date: 20131120
Implantable Lead Implant Date: 20131120
Implantable Lead Implant Date: 20131120
Implantable Lead Location: 753858
Implantable Lead Location: 753859
Implantable Lead Location: 753860
Implantable Lead Model: 4196
Implantable Lead Model: 5076
Implantable Lead Model: 5076
Implantable Pulse Generator Implant Date: 20210611
Lead Channel Impedance Value: 304 Ohm
Lead Channel Impedance Value: 323 Ohm
Lead Channel Impedance Value: 323 Ohm
Lead Channel Impedance Value: 3401 Ohm
Lead Channel Impedance Value: 3401 Ohm
Lead Channel Impedance Value: 3401 Ohm
Lead Channel Impedance Value: 399 Ohm
Lead Channel Impedance Value: 418 Ohm
Lead Channel Impedance Value: 551 Ohm
Lead Channel Pacing Threshold Amplitude: 0.5 V
Lead Channel Pacing Threshold Amplitude: 2.125 V
Lead Channel Pacing Threshold Pulse Width: 0.4 ms
Lead Channel Pacing Threshold Pulse Width: 0.4 ms
Lead Channel Sensing Intrinsic Amplitude: 8 mV
Lead Channel Sensing Intrinsic Amplitude: 8 mV
Lead Channel Setting Pacing Amplitude: 2 V
Lead Channel Setting Pacing Amplitude: 2.75 V
Lead Channel Setting Pacing Pulse Width: 0.4 ms
Lead Channel Setting Pacing Pulse Width: 0.4 ms
Lead Channel Setting Sensing Sensitivity: 0.9 mV

## 2020-04-11 NOTE — Progress Notes (Signed)
Remote pacemaker transmission.   

## 2020-04-15 ENCOUNTER — Encounter: Payer: Medicare Other | Admitting: Internal Medicine

## 2020-04-16 ENCOUNTER — Encounter: Payer: Self-pay | Admitting: Internal Medicine

## 2020-04-16 ENCOUNTER — Other Ambulatory Visit: Payer: Self-pay

## 2020-04-16 ENCOUNTER — Ambulatory Visit (INDEPENDENT_AMBULATORY_CARE_PROVIDER_SITE_OTHER): Payer: 59 | Admitting: Internal Medicine

## 2020-04-16 VITALS — BP 126/76 | HR 86 | Ht 64.0 in | Wt 130.0 lb

## 2020-04-16 DIAGNOSIS — I4891 Unspecified atrial fibrillation: Secondary | ICD-10-CM

## 2020-04-16 DIAGNOSIS — Z950 Presence of cardiac pacemaker: Secondary | ICD-10-CM | POA: Diagnosis not present

## 2020-04-16 DIAGNOSIS — I5022 Chronic systolic (congestive) heart failure: Secondary | ICD-10-CM | POA: Diagnosis not present

## 2020-04-16 NOTE — Patient Instructions (Signed)

## 2020-04-16 NOTE — Progress Notes (Signed)
HPI [name] returns today for followup. She is a pleasant 64 yo woman with chronic systolic heart failure, chronic atrial fib s/p AV node ablation with CHB, s/p biv PPM insertion who underwent PM gen change out 3 months ago. She has lost weight and is undergoing eval. She denies chest pain or sob. No syncope. No edema. she has had some gout in her legs.She had no trouble healing from her PM gen change out. No Known Allergies   Current Outpatient Medications  Medication Sig Dispense Refill  . acetaminophen (TYLENOL) 500 MG tablet Take 500 mg by mouth every 6 (six) hours as needed for moderate pain or headache.    . albuterol (VENTOLIN HFA) 108 (90 Base) MCG/ACT inhaler INHALE TWO PUFFS EVERY 6 HOURS AS NEEDED FOR WHEEZING OR SHORTNESS OF BREATH 8.5 g 1  . ALPRAZolam (XANAX) 0.25 MG tablet Take 0.25 mg by mouth 2 (two) times daily as needed for anxiety.    . calcium carbonate (TUMS - DOSED IN MG ELEMENTAL CALCIUM) 500 MG chewable tablet Chew 500 mg by mouth 3 (three) times daily as needed for indigestion or heartburn.     . carvedilol (COREG) 12.5 MG tablet TAKE ONE TABLET BY MOUTH TWICE DAILY WITH MEALS 180 tablet 3  . ferrous sulfate 325 (65 FE) MG tablet Take 325 mg by mouth in the morning, at noon, and at bedtime.     . furosemide (LASIX) 40 MG tablet Take 1 tablet (40 mg total) by mouth See admin instructions. Take 40 mg daily. I weight is above 144 lbs take an extra 40 mg. 120 tablet 3  . indomethacin (INDOCIN) 25 MG capsule Take 25 mg by mouth 2 (two) times daily as needed for mild pain.    Marland Kitchen lisinopril (ZESTRIL) 5 MG tablet TAKE ONE TABLET BY MOUTH DAILY 90 tablet 1  . loratadine (CLARITIN) 10 MG tablet Take 10 mg by mouth daily as needed for allergies.    . magnesium oxide (MAG-OX) 400 (241.3 Mg) MG tablet Take 1 tablet (400 mg total) by mouth daily. 90 tablet 0  . pantoprazole (PROTONIX) 40 MG tablet Take 40 mg by mouth daily.    . Probiotic Product (PROBIOTIC PO) Take 2  tablets by mouth daily.    . sertraline (ZOLOFT) 50 MG tablet Take 50 mg by mouth daily.     Marland Kitchen SPIRIVA HANDIHALER 18 MCG inhalation capsule INHALE ONE PUFF BY MOUTH EVERY DAY 30 capsule 6  . tiotropium (SPIRIVA HANDIHALER) 18 MCG inhalation capsule INHALE ONE DOSE IN INHALER BY MOUTH ONCE DAILY 90 capsule 0  . XARELTO 20 MG TABS tablet TAKE ONE TABLET BY MOUTH DAILY 90 tablet 1  . zolpidem (AMBIEN) 5 MG tablet Take 5 mg by mouth at bedtime. For sleep     No current facility-administered medications for this visit.     Past Medical History:  Diagnosis Date  . Anemia    2011: Hemoglobin of 11.4; hematocrit of 33.5; normal MCV  . Anxiety   . Anxiety and depression   . Atrial fibrillation (HCC)    Echocardiogram in 2004-borderline LV function; no valvular abnormalities; 06/2012: AV nodal ablation + biventricular pacemaker  . Cardiac arrest (HCC) 06/2012   16 day hospitalization; caused by hyperkalemia; multiple complications including CHF; permanent atrial fibrillation requiring AV nodal ablation + pacing  . CHF (congestive heart failure) (HCC)   . COPD (chronic obstructive pulmonary disease) (HCC)    heterozygous alpha-1 anti-trypsin deficiency  . Depression   .  Dysrhythmia   . GERD (gastroesophageal reflux disease)    protonix  . Nonischemic cardiomyopathy (HCC)    Echo 06/2012: EF 30-35%, moderate to severe MR; cardiac cath 06/2012:30-40% D1 and D2, estimated EF-35%, MR-not graded; right bundle branch block  . Presence of permanent cardiac pacemaker   . Shortness of breath dyspnea   . Thyroid disease   . Urinary frequency     ROS:   All systems reviewed and negative except as noted in the HPI.   Past Surgical History:  Procedure Laterality Date  . AV NODE ABLATION N/A 06/21/2012   Procedure: AV NODE ABLATION;  Surgeon: Marinus Maw, MD;  Location: [facility] CATH LAB;  Service: Cardiovascular;  Laterality: N/A;  . AV NODE ABLATION N/A 06/22/2012   Procedure: AV NODE ABLATION;   Surgeon: Duke Salvia, MD;  Location: [facility] CATH LAB;  Service: Cardiovascular;  Laterality: N/A;  . BREAST BIOPSY     left, [facility]  . BREAST EXCISIONAL BIOPSY     Left x2  . CARDIOVERSION  06/19/2012   Procedure: CARDIOVERSION;  Surgeon: Vesta Mixer, MD;  Location: [facility] ENDOSCOPY;  Service: Cardiovascular;;  . CARDIOVERSION  06/21/2012   Procedure: CARDIOVERSION;  Surgeon: Cassell Clement, MD;  Location: [facility] OR;  Service: Cardiovascular;  Laterality: N/A;  . CERVICAL CONIZATION W/BX  08/11/2011   Procedure: CONIZATION CERVIX WITH BIOPSY;  Surgeon: Lazaro Arms, MD;  Location: [facility] ORS;  Service: Gynecology;  Laterality: N/A;  Laser Conization of Cervix  . EPICARDIAL PACING LEAD PLACEMENT N/A 02/28/2015   Procedure: LV EPICARDIAL PACING LEAD PLACEMENT;  Surgeon: Alleen Borne, MD;  Location: [facility] OR;  Service: Thoracic;  Laterality: N/A;  . INSERT / REPLACE / REMOVE PACEMAKER    . LEFT HEART CATHETERIZATION WITH CORONARY ANGIOGRAM N/A 06/15/2012   Procedure: LEFT HEART CATHETERIZATION WITH CORONARY ANGIOGRAM;  Surgeon: Herby Abraham, MD;  Location: [facility] CATH LAB;  Service: Cardiovascular;  Laterality: N/A;  . PERMANENT PACEMAKER INSERTION N/A 06/21/2012   Procedure: PERMANENT PACEMAKER INSERTION;  Surgeon: Marinus Maw, MD;  Location: [facility] CATH LAB;  Service: Cardiovascular;  Laterality: N/A;  . PPM GENERATOR CHANGEOUT N/A 01/11/2020   Procedure: PPM GENERATOR CHANGEOUT;  Surgeon: Marinus Maw, MD;  Location: [facility] INVASIVE CV LAB;  Service: Cardiovascular;  Laterality: N/A;  . TEE WITHOUT CARDIOVERSION  06/19/2012   Procedure: TRANSESOPHAGEAL ECHOCARDIOGRAM (TEE);  Surgeon: Vesta Mixer, MD;  Location: [facility] ENDOSCOPY;  Service: Cardiovascular;  Laterality: N/A;  . THORACOTOMY Left 02/28/2015   Procedure: THORACOTOMY MAJOR;  Surgeon: Alleen Borne, MD;  Location: [facility] OR;  Service: Thoracic;  Laterality: Left;     Family History  Problem Relation Age of Onset  . Cancer Father        carcinoma  of the lung  . Cervical cancer Mother   . Cancer Sister        carcinoma of the lung  . Irritable bowel syndrome Other        mother, sister  . Anesthesia problems Neg Hx   . Hypotension Neg Hx   . Malignant hyperthermia Neg Hx   . Pseudochol deficiency Neg Hx   . Colon cancer Neg Hx   . Celiac disease Neg Hx   . Inflammatory bowel disease Neg Hx      Social History   Socioeconomic History  . Marital status: Divorced    Spouse name: Not on file  . Number of children: 2  . Years of education: Not on file  .  Highest education level: Not on file  Occupational History  . 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 Smoking status: Never Smoker  . Smokeless tobacco: Never Used  Vaping Use  . Vaping Use: Never used  Substance and Sexual Activity  . Alcohol use: No    Alcohol/week: 0.0 standard drinks  . Drug use: No  . Sexual activity: Not on file  Other Topics Concern  . Not on file  Social History Narrative   ** Merged History Encounter **       ** Data from: 06/22/12 Enc Dept: [facility]       ** Data from: 06/05/12 Enc Dept: [facility]   Lives alone with good family support from sisters.          Social Determinants of Health   Financial Resource Strain:   . Difficulty of Paying Living Expenses: Not on file  Food Insecurity:   . Worried About Programme researcher, broadcasting/film/video in the Last Year: Not on file  . Ran Out of Food in the Last Year: Not on file  Transportation Needs:   . Lack of Transportation (Medical): Not on file  . Lack of Transportation (Non-Medical): Not on file  Physical Activity:   . Days of Exercise per Week: Not on file  . Minutes of Exercise per Session: Not on file  Stress:   . Feeling of Stress : Not on file  Social Connections:   . Frequency of Communication with Friends and Family: Not on file  . Frequency of Social  Gatherings with Friends and Family: Not on file  . Attends Religious Services: Not on file  . Active Member of Clubs or Organizations: Not on file  . Attends Banker Meetings: Not on file  . Marital Status: Not on file  Intimate Partner Violence:   . Fear of Current or Ex-Partner: Not on file  . Emotionally Abused: Not on file  . Physically Abused: Not on file  . Sexually Abused: Not on file     BP 126/76   Pulse 86   Ht 5\' 4"  (1.626 m)   Wt 130 lb (59 kg)   SpO2 95%   BMI 22.31 kg/m   Physical Exam:  Well appearing NAD HEENT: Unremarkable Neck:  No JVD, no thyromegally Lymphatics:  No adenopathy Back:  No CVA tenderness Lungs:  Clear with no wheezes HEART:  Regular rate rhythm, no murmurs, no rubs, no clicks Abd:  soft, positive bowel sounds, no organomegally, no rebound, no guarding Ext:  2 plus pulses, no edema, no cyanosis, no clubbing Skin:  No rashes no nodules Neuro:  CN II through XII intact, motor grossly intact  EKG - atrial fib with biv pacing  DEVICE  Normal device function.  See PaceArt for details.   Assess/Plan: 1. CHB - she is asymptomatic, s/p PPM insertion 2. PPM - her Medtronic DDD biv PM is working normally. 3. Chronic systolic heart failure - her symptoms are class 2. She admits to dietary indiscretion.  4. NSVT - this is noted on her interrogation. She is asymptomatic.   Victorina Kable,MD

## 2020-05-21 ENCOUNTER — Other Ambulatory Visit: Payer: Self-pay | Admitting: Cardiology

## 2020-05-27 ENCOUNTER — Other Ambulatory Visit: Payer: Self-pay | Admitting: Cardiology

## 2020-06-04 ENCOUNTER — Telehealth: Payer: Self-pay | Admitting: Cardiology

## 2020-06-04 NOTE — Telephone Encounter (Signed)
Pt had a bone marrow biopsy today and she'd like to know when she should start back taking her Xarelto. She stopped it on Sunday   Please call [phone]

## 2020-06-05 NOTE — Telephone Encounter (Signed)
Pt called back and says provider told her to contact us regarding re starting Xarelto

## 2020-06-05 NOTE — Telephone Encounter (Signed)
Pt voiced understanding

## 2020-06-05 NOTE — Telephone Encounter (Signed)
Would wait 3 days after the procedure to restart. So if had procedure Wed don't take Thurs, Fri, or Sat and then restart on Sunday  J Mukesh Kornegay MD

## 2020-06-05 NOTE — Telephone Encounter (Signed)
Pt aware that she would need to contact provider that performed the procedure and verify when she can re start her Xarelto

## 2020-06-23 ENCOUNTER — Telehealth: Payer: Self-pay | Admitting: *Deleted

## 2020-06-23 NOTE — Telephone Encounter (Signed)
Patient contacted office to see if its safe to have an endoscopy procedure. Says she is concerned since she has a PPM.  Advised that PPM doesn't prevent endoscopy procedures and that she could discuss this further with provider at visit on 07/01/2020. Verbalized understanding.

## 2020-06-30 ENCOUNTER — Other Ambulatory Visit: Payer: Self-pay | Admitting: Cardiology

## 2020-06-30 ENCOUNTER — Telehealth: Payer: Self-pay | Admitting: Cardiology

## 2020-06-30 NOTE — Telephone Encounter (Signed)
  Patient Consent for Virtual Visit         [name] [name] has provided verbal consent on 06/30/2020 for a virtual visit (video or telephone).   CONSENT FOR VIRTUAL VISIT FOR:  [name] [name]  By participating in this virtual visit I agree to the following:  I hereby voluntarily request, consent and authorize [facility] and its employed or contracted physicians, physician assistants, nurse practitioners or other licensed health care professionals (the Practitioner), to provide me with telemedicine health care services (the "Services") as deemed necessary by the treating Practitioner. I acknowledge and consent to receive the Services by the Practitioner via telemedicine. I understand that the telemedicine visit will involve communicating with the Practitioner through live audiovisual communication technology and the disclosure of certain medical information by electronic transmission. I acknowledge that I have been given the opportunity to request an in-person assessment or other available alternative prior to the telemedicine visit and am voluntarily participating in the telemedicine visit.  I understand that I have the right to withhold or withdraw my consent to the use of telemedicine in the course of my care at any time, without affecting my right to future care or treatment, and that the Practitioner or I may terminate the telemedicine visit at any time. I understand that I have the right to inspect all information obtained and/or recorded in the course of the telemedicine visit and may receive copies of available information for a reasonable fee.  I understand that some of the potential risks of receiving the Services via telemedicine include:  Marland Kitchen Delay or interruption in medical evaluation due to technological equipment failure or disruption; . Information transmitted may not be sufficient (e.g. poor resolution of images) to allow for appropriate medical decision making by the Practitioner;  and/or  . In rare instances, security protocols could fail, causing a breach of personal health information.  Furthermore, I acknowledge that it is my responsibility to provide information about my medical history, conditions and care that is complete and accurate to the best of my ability. I acknowledge that Practitioner's advice, recommendations, and/or decision may be based on factors not within their control, such as incomplete or inaccurate data provided by me or distortions of diagnostic images or specimens that may result from electronic transmissions. I understand that the practice of medicine is not an exact science and that Practitioner makes no warranties or guarantees regarding treatment outcomes. I acknowledge that a copy of this consent can be made available to me via my patient portal [facility] MyChart), or I can request a printed copy by calling the office of [facility].    I understand that my insurance will be billed for this visit.   I have read or had this consent read to me. . I understand the contents of this consent, which adequately explains the benefits and risks of the Services being provided via telemedicine.  . I have been provided ample opportunity to ask questions regarding this consent and the Services and have had my questions answered to my satisfaction. . I give my informed consent for the services to be provided through the use of telemedicine in my medical care

## 2020-07-01 ENCOUNTER — Telehealth (INDEPENDENT_AMBULATORY_CARE_PROVIDER_SITE_OTHER): Payer: 59 | Admitting: Cardiology

## 2020-07-01 ENCOUNTER — Encounter: Payer: Self-pay | Admitting: Cardiology

## 2020-07-01 VITALS — Ht 64.0 in | Wt 119.0 lb

## 2020-07-01 DIAGNOSIS — I5022 Chronic systolic (congestive) heart failure: Secondary | ICD-10-CM

## 2020-07-01 DIAGNOSIS — I10 Essential (primary) hypertension: Secondary | ICD-10-CM | POA: Diagnosis not present

## 2020-07-01 DIAGNOSIS — I4891 Unspecified atrial fibrillation: Secondary | ICD-10-CM

## 2020-07-01 DIAGNOSIS — Z950 Presence of cardiac pacemaker: Secondary | ICD-10-CM

## 2020-07-01 NOTE — Patient Instructions (Signed)

## 2020-07-01 NOTE — Progress Notes (Signed)
Virtual Visit via Telephone Note   This visit type was conducted due to national recommendations for restrictions regarding the COVID-19 Pandemic (e.g. social distancing) in an effort to limit this patient's exposure and mitigate transmission in our community.  Due to her co-morbid illnesses, this patient is at least at moderate risk for complications without adequate follow up.  This format is felt to be most appropriate for this patient at this time.  The patient did not have access to video technology/had technical difficulties with video requiring transitioning to audio format only (telephone).  All issues noted in this document were discussed and addressed.  No physical exam could be performed with this format.  Please refer to the patient's chart for her  consent to telehealth for [facility].    Date:  07/01/2020   ID:  [name] [name] 1955-09-30, MRN [id number] The patient was identified using 2 identifiers.  Patient Location: Home Provider Location: Office/Clinic  PCP:  System, Provider Not In  Cardiologist:  Dina Rich, MD  Electrophysiologist:  None   Evaluation Performed:  Follow-Up Visit  Chief Complaint:  Follow up  History of Present Illness:    [name] [name] is a 64 y.o. female seen today for follow up of the following medical problems.   1. NICM  - echo 10/2013 LVEF 30-35%, restrictive diastolic dysfunction - cath Jan 2011 showed no obstructive CAD  - repeat echo 10/2015 LVEF 45-50%. - 12/2017 LVEF 50-55%,    - some SOB with very high levels of exertion.  - no recent edema.Weight down from 140 to 133 lbs over the last year - compliant with meds  - occasional LE edema. Weights down to 119, had been around 130 lbs.   2. Chronic afib  - prior AV nodal ablation with permanent pacemaker    - no recent palpitations. Doing well on xarelto.    3. Pacemaker - she has a [employer]onic BiV pacemaker followed by Dr Ladona Ridgel, LV lead  replaced epicardial by Dr Laneta Simmers 01/2015  - normal device check during 04/2020 appt with Dr Ladona Ridgel    4. COPD -previously followed by Dr Okey Dupre to establish with pulmonary in [place]   5. HTN - compliant with meds     SH: Moved to [place], [place], continues to come to [place] for cardiology and to visit her family   The patient does not have symptoms concerning for COVID-19 infection (fever, chills, cough, or new shortness of breath).    Past Medical History:  Diagnosis Date  . Anemia    2011: Hemoglobin of 11.4; hematocrit of 33.5; normal MCV  . Anxiety   . Anxiety and depression   . Atrial fibrillation (HCC)    Echocardiogram in 2004-borderline LV function; no valvular abnormalities; 06/2012: AV nodal ablation + biventricular pacemaker  . Cardiac arrest (HCC) 06/2012   16 day hospitalization; caused by hyperkalemia; multiple complications including CHF; permanent atrial fibrillation requiring AV nodal ablation + pacing  . CHF (congestive heart failure) (HCC)   . COPD (chronic obstructive pulmonary disease) (HCC)    heterozygous alpha-1 anti-trypsin deficiency  . Depression   . Dysrhythmia   . GERD (gastroesophageal reflux disease)    protonix  . Nonischemic cardiomyopathy (HCC)    Echo 06/2012: EF 30-35%, moderate to severe MR; cardiac cath 06/2012:30-40% D1 and D2, estimated EF-35%, MR-not graded; right bundle [place] Addis block  . Presence of permanent cardiac pacemaker   . Shortness of breath dyspnea   . Thyroid disease   .  Urinary frequency    Past Surgical History:  Procedure Laterality Date  . AV NODE ABLATION N/A 06/21/2012   Procedure: AV NODE ABLATION;  Surgeon: Marinus Maw, MD;  Location: [facility] CATH LAB;  Service: Cardiovascular;  Laterality: N/A;  . AV NODE ABLATION N/A 06/22/2012   Procedure: AV NODE ABLATION;  Surgeon: Duke Salvia, MD;  Location: [facility] CATH LAB;  Service: Cardiovascular;  Laterality: N/A;  . BREAST BIOPSY     left, [facility]    . BREAST EXCISIONAL BIOPSY     Left x2  . CARDIOVERSION  06/19/2012   Procedure: CARDIOVERSION;  Surgeon: Vesta Mixer, MD;  Location: [facility] ENDOSCOPY;  Service: Cardiovascular;;  . CARDIOVERSION  06/21/2012   Procedure: CARDIOVERSION;  Surgeon: Cassell Clement, MD;  Location: [facility] OR;  Service: Cardiovascular;  Laterality: N/A;  . CERVICAL CONIZATION W/BX  08/11/2011   Procedure: CONIZATION CERVIX WITH BIOPSY;  Surgeon: Lazaro Arms, MD;  Location: [facility] ORS;  Service: Gynecology;  Laterality: N/A;  Laser Conization of Cervix  . EPICARDIAL PACING LEAD PLACEMENT N/A 02/28/2015   Procedure: LV EPICARDIAL PACING LEAD PLACEMENT;  Surgeon: Alleen Borne, MD;  Location: [facility] OR;  Service: Thoracic;  Laterality: N/A;  . INSERT / REPLACE / REMOVE PACEMAKER    . LEFT HEART CATHETERIZATION WITH CORONARY ANGIOGRAM N/A 06/15/2012   Procedure: LEFT HEART CATHETERIZATION WITH CORONARY ANGIOGRAM;  Surgeon: Herby Abraham, MD;  Location: [facility] CATH LAB;  Service: Cardiovascular;  Laterality: N/A;  . PERMANENT PACEMAKER INSERTION N/A 06/21/2012   Procedure: PERMANENT PACEMAKER INSERTION;  Surgeon: Marinus Maw, MD;  Location: [facility] CATH LAB;  Service: Cardiovascular;  Laterality: N/A;  . PPM GENERATOR CHANGEOUT N/A 01/11/2020   Procedure: PPM GENERATOR CHANGEOUT;  Surgeon: Marinus Maw, MD;  Location: [facility] INVASIVE CV LAB;  Service: Cardiovascular;  Laterality: N/A;  . TEE WITHOUT CARDIOVERSION  06/19/2012   Procedure: TRANSESOPHAGEAL ECHOCARDIOGRAM (TEE);  Surgeon: Vesta Mixer, MD;  Location: [facility] ENDOSCOPY;  Service: Cardiovascular;  Laterality: N/A;  . THORACOTOMY Left 02/28/2015   Procedure: THORACOTOMY MAJOR;  Surgeon: Alleen Borne, MD;  Location: [facility] OR;  Service: Thoracic;  Laterality: Left;     No outpatient medications have been marked as taking for the 07/01/20 encounter (Appointment) with [name], MD.     Allergies:   Patient has no known allergies.   Social History   Tobacco Use  .  Smoking status: Never Smoker  . Smokeless tobacco: Never Used  Vaping Use  . Vaping Use: Never used  Substance Use Topics  . Alcohol use: No    Alcohol/week: 0.0 standard drinks  . Drug use: No     Family Hx: The patient's family history includes Cancer in her father and sister; Cervical cancer in her mother; Irritable bowel syndrome in an other family member. There is no history of Anesthesia problems, Hypotension, Malignant hyperthermia, Pseudochol deficiency, Colon cancer, Celiac disease, or Inflammatory bowel disease.  ROS:   Please see the history of present illness.     All other systems reviewed and are negative.   Prior CV studies:   The following studies were reviewed today:  06/2012 TEE: mod to severe MR, milld to mod TR   06/12/12 Cath Hemodynamics:  AO 118/68 (83)  LV 119/8  Cannot calculate gradient due to rapid atrial fib  Coronary angiography:  Coronary dominance: right  Left mainstem: Short without significant disease  Left anterior descending (LAD): There is calcification proximally. There are two major diagonal  branches. Between the first and second diagonal branches are approximately 30-40% plaquing. The distal LAD has mild irregularity. Both diagonals have minimal irregularity.  Left circumflex (LCx): Provides one large bifurcation marginal Mirian Casco and a small AV circumflex. There is minor distal irregularity. The AV circ is small  Right coronary artery (RCA): Provides a large PDA and a large and smaller PLA branches. No significant focal obstruction.  Left ventriculography: Due to ectopy, EF cannot be calculated. With AF, difficult to be sure of function but appears 35%. There does appear to be some MR.  Final Conclusions:  1. Atrial fib with rapid ventricular response.  2. Mild calcification of proximal LAD without critical obstruction. No high grade coronary artery disease.  09/2012 Echo: LVEF 25%, severely dilated LV, mild LVH, mild AI,  mild MR   09/26/13 Clinic EKG Afib, V-paced  11/08/13 Echo Study Conclusions  - Left ventricle: The cavity size was moderately dilated. Wall thickness was increased in a pattern of mild LVH. Systolic function was moderately to severely reduced. The estimated ejection fraction was in the range of 30% to 35%. Diffuse hypokinesis. There is severe hypokinesis of the anteroseptal myocardium. Doppler parameters are consistent with restrictive physiology, indicative of decreased left ventricular diastolic compliance and/or increased left atrial pressure. - Ventricular septum: Septal motion showed abnormal function and dyssynergy. - Aortic valve: Trileaflet; mildly thickened leaflets. Mild to moderate regurgitation. - Mitral valve: Mildly thickened leaflets . Moderate regurgitation, appears to be made up of two jets. Suboptimal PISA. - Left atrium: The atrium was severely dilated. - Right ventricle: The cavity size was mildly dilated. Pacer wire or catheter noted in right ventricle. - Right atrium: The atrium was moderately to severely dilated. - Tricuspid valve: Mild-moderate regurgitation. - Pulmonic valve: Mild regurgitation. - Pulmonary arteries: Systolic pressure was moderately increased. - Pericardium, extracardiac: There was no pericardial effusion. Impressions:  - Mild LVH with moderate chamber dilatation and LVEF 30-35% with diffuse hypokinesis, most prominent anteroseptal wall in setting of septal dyssynergy and pacing. Restrictive diastolic filling pattern with increased filling pressures. Severe left atrial and moderate to severe right atrial enlargement. MIldly thickened mitral valve with overall moderate mitral regurgitation made up of two jets (PISA suboptimal). Mild to moderate aortic regurgitation. Device wire in right heart. MIld to moderate tricuspid regurgitation with PASP 51 mmHg.   10/2015 echo Study Conclusions  - Left ventricle: The cavity size  was normal. Systolic function was mildly reduced. The estimated ejection fraction was approximately 45%. Diffuse hypokinesis. Diastolic dysfunction, indeterminate grade and filling pressures. Mild septal thickening. - Aortic valve: Mildly to moderately calcified annulus. Trileaflet. There was mild to moderate regurgitation. - Mitral valve: There was moderate regurgitation. - Left atrium: The atrium was severely dilated. - Right ventricle: Pacer wire or catheter noted in right ventricle. - Right atrium: The atrium was mildly to moderately dilated. - Tricuspid valve: There was mild regurgitation. - Pulmonic valve: There was mild regurgitation.  Labs/Other Tests and Data Reviewed:    EKG:  No ECG reviewed.  Recent Labs: 01/09/2020: BUN 18; Creatinine, Ser 0.78; Hemoglobin 9.8; Platelets 93; Potassium 3.6; Sodium 139   Recent Lipid Panel Lab Results  Component Value Date/Time   CHOL 169 04/25/2018 11:52 AM   TRIG 121 04/25/2018 11:52 AM   HDL 40 (L) 04/25/2018 11:52 AM   CHOLHDL 4.2 04/25/2018 11:52 AM   LDLCALC 107 (H) 04/25/2018 11:52 AM    Wt Readings from Last 3 Encounters:  04/16/20 130 lb (59 kg)  01/11/20 143  lb (64.9 kg)  12/28/19 133 lb (60.3 kg)     Risk Assessment/Calculations:      Objective:    Vital Signs:   Today's Vitals   07/01/20 1031  Weight: 119 lb (54 kg)  Height: 5\' 4"  (1.626 m)   Body mass index is 20.43 kg/m. Normal affect. Normal speech pattern and tone. No apparent distress, no audible signs of sob or wheezing. Comfortable, no apperent distress.   ASSESSMENT & PLAN:    1. NICM/Chronic systolic HF -LVEF has normalized - doing well, continue current meds  2. Afib  - hx of av nodal ablation with pacemaker  -doing well, compliant with meds  3. Pacemaker -normal recent device check, continue to monitor.   4. HTN -continue current meds    Shared Decision Making/Informed Consent        COVID-19 Education: The signs  and symptoms of COVID-19 were discussed with the patient and how to seek care for testing (follow up with PCP or arrange E-visit).  The importance of social distancing was discussed today.  Time:   Today, I have spent 17 minutes with the patient with telehealth technology discussing the above problems.     Medication Adjustments/Labs and Tests Ordered: Current medicines are reviewed at length with the patient today.  Concerns regarding medicines are outlined above.   Tests Ordered: No orders of the defined types were placed in this encounter.   Medication Changes: No orders of the defined types were placed in this encounter.   Follow Up:  In Person in 6 month(s)  Signed, , MD  07/01/2020 8:21 AM    [facility]

## 2020-07-11 ENCOUNTER — Ambulatory Visit (INDEPENDENT_AMBULATORY_CARE_PROVIDER_SITE_OTHER): Payer: 59

## 2020-07-11 DIAGNOSIS — I469 Cardiac arrest, cause unspecified: Secondary | ICD-10-CM | POA: Diagnosis not present

## 2020-07-12 LAB — CUP PACEART REMOTE DEVICE CHECK
Battery Remaining Longevity: 90 mo
Battery Voltage: 3.08 V
Brady Statistic AP VP Percent: 0 %
Brady Statistic AP VS Percent: 0 %
Brady Statistic AS VP Percent: 95.67 %
Brady Statistic AS VS Percent: 4.33 %
Brady Statistic RA Percent Paced: 0 %
Brady Statistic RV Percent Paced: 95.67 %
Date Time Interrogation Session: 20211210110736
Implantable Lead Implant Date: 20131120
Implantable Lead Implant Date: 20131120
Implantable Lead Implant Date: 20131120
Implantable Lead Location: 753858
Implantable Lead Location: 753859
Implantable Lead Location: 753860
Implantable Lead Model: 4196
Implantable Lead Model: 5076
Implantable Lead Model: 5076
Implantable Pulse Generator Implant Date: 20210611
Lead Channel Impedance Value: 304 Ohm
Lead Channel Impedance Value: 304 Ohm
Lead Channel Impedance Value: 304 Ohm
Lead Channel Impedance Value: 3401 Ohm
Lead Channel Impedance Value: 3401 Ohm
Lead Channel Impedance Value: 3401 Ohm
Lead Channel Impedance Value: 380 Ohm
Lead Channel Impedance Value: 399 Ohm
Lead Channel Impedance Value: 551 Ohm
Lead Channel Pacing Threshold Amplitude: 0.5 V
Lead Channel Pacing Threshold Amplitude: 2.125 V
Lead Channel Pacing Threshold Pulse Width: 0.4 ms
Lead Channel Pacing Threshold Pulse Width: 0.4 ms
Lead Channel Sensing Intrinsic Amplitude: 2.875 mV
Lead Channel Sensing Intrinsic Amplitude: 2.875 mV
Lead Channel Setting Pacing Amplitude: 2 V
Lead Channel Setting Pacing Amplitude: 2.75 V
Lead Channel Setting Pacing Pulse Width: 0.4 ms
Lead Channel Setting Pacing Pulse Width: 0.4 ms
Lead Channel Setting Sensing Sensitivity: 0.9 mV

## 2020-07-23 NOTE — Progress Notes (Signed)
Remote pacemaker transmission.   

## 2020-08-02 DEATH — deceased

## 2021-01-16 ENCOUNTER — Ambulatory Visit: Payer: 59 | Admitting: Cardiology
